# Patient Record
Sex: Female | Born: 1945 | Race: White | Hispanic: No | Smoking: Never smoker
Health system: Southern US, Community
[De-identification: ages and names within clinical notes are randomized; demographics above are authoritative.]

## PROBLEM LIST (undated history)

## (undated) DIAGNOSIS — I440 Atrioventricular block, first degree: Secondary | ICD-10-CM

## (undated) DIAGNOSIS — C679 Malignant neoplasm of bladder, unspecified: Secondary | ICD-10-CM

## (undated) DIAGNOSIS — E119 Type 2 diabetes mellitus without complications: Secondary | ICD-10-CM

## (undated) DIAGNOSIS — C229 Malignant neoplasm of liver, not specified as primary or secondary: Secondary | ICD-10-CM

## (undated) DIAGNOSIS — G2581 Restless legs syndrome: Secondary | ICD-10-CM

## (undated) DIAGNOSIS — K219 Gastro-esophageal reflux disease without esophagitis: Secondary | ICD-10-CM

## (undated) DIAGNOSIS — E079 Disorder of thyroid, unspecified: Secondary | ICD-10-CM

## (undated) DIAGNOSIS — M1990 Unspecified osteoarthritis, unspecified site: Secondary | ICD-10-CM

## (undated) DIAGNOSIS — R7303 Prediabetes: Secondary | ICD-10-CM

## (undated) DIAGNOSIS — C50211 Malignant neoplasm of upper-inner quadrant of right female breast: Secondary | ICD-10-CM

## (undated) DIAGNOSIS — C50919 Malignant neoplasm of unspecified site of unspecified female breast: Secondary | ICD-10-CM

## (undated) DIAGNOSIS — C649 Malignant neoplasm of unspecified kidney, except renal pelvis: Secondary | ICD-10-CM

## (undated) DIAGNOSIS — I341 Nonrheumatic mitral (valve) prolapse: Secondary | ICD-10-CM

## (undated) DIAGNOSIS — D649 Anemia, unspecified: Secondary | ICD-10-CM

## (undated) DIAGNOSIS — Z170 Estrogen receptor positive status [ER+]: Secondary | ICD-10-CM

## (undated) DIAGNOSIS — J189 Pneumonia, unspecified organism: Secondary | ICD-10-CM

## (undated) DIAGNOSIS — I10 Essential (primary) hypertension: Secondary | ICD-10-CM

## (undated) DIAGNOSIS — F329 Major depressive disorder, single episode, unspecified: Secondary | ICD-10-CM

## (undated) DIAGNOSIS — Z95810 Presence of automatic (implantable) cardiac defibrillator: Secondary | ICD-10-CM

## (undated) DIAGNOSIS — I472 Ventricular tachycardia: Secondary | ICD-10-CM

## (undated) DIAGNOSIS — I4510 Unspecified right bundle-branch block: Secondary | ICD-10-CM

## (undated) DIAGNOSIS — I4729 Other ventricular tachycardia: Secondary | ICD-10-CM

## (undated) DIAGNOSIS — N2889 Other specified disorders of kidney and ureter: Secondary | ICD-10-CM

## (undated) DIAGNOSIS — I480 Paroxysmal atrial fibrillation: Secondary | ICD-10-CM

## (undated) DIAGNOSIS — Z7901 Long term (current) use of anticoagulants: Secondary | ICD-10-CM

## (undated) DIAGNOSIS — K589 Irritable bowel syndrome without diarrhea: Secondary | ICD-10-CM

## (undated) DIAGNOSIS — C801 Malignant (primary) neoplasm, unspecified: Secondary | ICD-10-CM

## (undated) DIAGNOSIS — M51369 Other intervertebral disc degeneration, lumbar region without mention of lumbar back pain or lower extremity pain: Secondary | ICD-10-CM

## (undated) DIAGNOSIS — G4700 Insomnia, unspecified: Secondary | ICD-10-CM

## (undated) DIAGNOSIS — F32A Depression, unspecified: Secondary | ICD-10-CM

## (undated) DIAGNOSIS — F419 Anxiety disorder, unspecified: Secondary | ICD-10-CM

## (undated) DIAGNOSIS — E039 Hypothyroidism, unspecified: Secondary | ICD-10-CM

## (undated) DIAGNOSIS — Z923 Personal history of irradiation: Secondary | ICD-10-CM

## (undated) DIAGNOSIS — R0600 Dyspnea, unspecified: Secondary | ICD-10-CM

## (undated) DIAGNOSIS — R001 Bradycardia, unspecified: Secondary | ICD-10-CM

## (undated) DIAGNOSIS — R319 Hematuria, unspecified: Secondary | ICD-10-CM

## (undated) DIAGNOSIS — E785 Hyperlipidemia, unspecified: Secondary | ICD-10-CM

## (undated) DIAGNOSIS — G709 Myoneural disorder, unspecified: Secondary | ICD-10-CM

## (undated) DIAGNOSIS — Z1379 Encounter for other screening for genetic and chromosomal anomalies: Principal | ICD-10-CM

## (undated) DIAGNOSIS — M5136 Other intervertebral disc degeneration, lumbar region: Secondary | ICD-10-CM

## (undated) HISTORY — DX: Encounter for other screening for genetic and chromosomal anomalies: Z13.79

## (undated) HISTORY — DX: Malignant neoplasm of unspecified site of unspecified female breast: C50.919

## (undated) HISTORY — DX: Hyperlipidemia, unspecified: E78.5

## (undated) HISTORY — DX: Prediabetes: R73.03

## (undated) HISTORY — DX: Restless legs syndrome: G25.81

## (undated) HISTORY — PX: LAPAROSCOPIC CHOLECYSTECTOMY: SUR755

## (undated) HISTORY — DX: Depression, unspecified: F32.A

## (undated) HISTORY — DX: Anxiety disorder, unspecified: F41.9

## (undated) HISTORY — PX: COLONOSCOPY: SHX174

## (undated) HISTORY — DX: Major depressive disorder, single episode, unspecified: F32.9

## (undated) HISTORY — PX: CATARACT EXTRACTION: SUR2

## (undated) HISTORY — DX: Insomnia, unspecified: G47.00

## (undated) HISTORY — DX: Essential (primary) hypertension: I10

## (undated) HISTORY — PX: CHOLECYSTECTOMY: SHX55

## (undated) HISTORY — PX: LOOP RECORDER INSERTION: EP1214

## (undated) HISTORY — PX: CATARACT EXTRACTION W/ INTRAOCULAR LENS  IMPLANT, BILATERAL: SHX1307

## (undated) HISTORY — PX: TONSILLECTOMY: SUR1361

## (undated) HISTORY — PX: INSERTION OF ICD: SHX6689

## (undated) HISTORY — DX: Disorder of thyroid, unspecified: E07.9

---
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
## 2014-11-03 ENCOUNTER — Ambulatory Visit
Admission: RE | Admit: 2014-11-03 | Discharge: 2014-11-03 | Disposition: A | Discharge status: Home / Self Care | Payer: Medicare PPO | Source: Ambulatory Visit | Attending: Family Medicine | Admitting: Family Medicine

## 2014-11-03 ENCOUNTER — Other Ambulatory Visit: Payer: Self-pay | Admitting: Family Medicine

## 2014-11-03 DIAGNOSIS — M25559 Pain in unspecified hip: Secondary | ICD-10-CM

## 2014-11-03 DIAGNOSIS — M545 Low back pain: Secondary | ICD-10-CM

## 2015-09-28 DIAGNOSIS — F5101 Primary insomnia: Secondary | ICD-10-CM | POA: Diagnosis not present

## 2015-09-28 DIAGNOSIS — Z23 Encounter for immunization: Secondary | ICD-10-CM | POA: Diagnosis not present

## 2015-12-10 DIAGNOSIS — G4700 Insomnia, unspecified: Secondary | ICD-10-CM | POA: Diagnosis not present

## 2015-12-10 DIAGNOSIS — G2581 Restless legs syndrome: Secondary | ICD-10-CM | POA: Diagnosis not present

## 2016-01-21 DIAGNOSIS — G2581 Restless legs syndrome: Secondary | ICD-10-CM | POA: Diagnosis not present

## 2016-02-08 DIAGNOSIS — I10 Essential (primary) hypertension: Secondary | ICD-10-CM | POA: Diagnosis not present

## 2016-02-11 DIAGNOSIS — G4700 Insomnia, unspecified: Secondary | ICD-10-CM | POA: Diagnosis not present

## 2016-02-18 DIAGNOSIS — Z803 Family history of malignant neoplasm of breast: Secondary | ICD-10-CM | POA: Diagnosis not present

## 2016-02-18 DIAGNOSIS — Z1231 Encounter for screening mammogram for malignant neoplasm of breast: Secondary | ICD-10-CM | POA: Diagnosis not present

## 2016-03-07 DIAGNOSIS — N6001 Solitary cyst of right breast: Secondary | ICD-10-CM | POA: Diagnosis not present

## 2016-03-07 DIAGNOSIS — N6312 Unspecified lump in the right breast, upper inner quadrant: Secondary | ICD-10-CM | POA: Diagnosis not present

## 2016-03-10 DIAGNOSIS — C50911 Malignant neoplasm of unspecified site of right female breast: Secondary | ICD-10-CM | POA: Diagnosis not present

## 2016-03-10 DIAGNOSIS — C50811 Malignant neoplasm of overlapping sites of right female breast: Secondary | ICD-10-CM | POA: Diagnosis not present

## 2016-03-10 DIAGNOSIS — N6001 Solitary cyst of right breast: Secondary | ICD-10-CM | POA: Diagnosis not present

## 2016-03-12 ENCOUNTER — Telehealth: Payer: Self-pay | Admitting: *Deleted

## 2016-03-12 NOTE — Telephone Encounter (Signed)
Left vm for return call regarding [place] on 03/19/16. Contact information provided.

## 2016-03-12 NOTE — Telephone Encounter (Signed)
Confirmed [facility] for 03/19/16 at 1215 .  Instructions and contact information given.

## 2016-03-18 ENCOUNTER — Encounter: Payer: Self-pay | Admitting: *Deleted

## 2016-03-18 ENCOUNTER — Other Ambulatory Visit: Payer: Self-pay | Admitting: *Deleted

## 2016-03-18 DIAGNOSIS — Z170 Estrogen receptor positive status [ER+]: Principal | ICD-10-CM

## 2016-03-18 DIAGNOSIS — C50211 Malignant neoplasm of upper-inner quadrant of right female breast: Secondary | ICD-10-CM | POA: Insufficient documentation

## 2016-03-19 ENCOUNTER — Ambulatory Visit
Admission: RE | Admit: 2016-03-19 | Discharge: 2016-03-19 | Disposition: A | Discharge status: Home / Self Care | Payer: Medicare PPO | Source: Ambulatory Visit | Attending: Radiation Oncology | Admitting: Radiation Oncology

## 2016-03-19 ENCOUNTER — Ambulatory Visit: Payer: Medicare PPO | Attending: General Surgery | Admitting: Physical Therapy

## 2016-03-19 ENCOUNTER — Encounter: Payer: Self-pay | Admitting: Oncology

## 2016-03-19 ENCOUNTER — Ambulatory Visit (HOSPITAL_BASED_OUTPATIENT_CLINIC_OR_DEPARTMENT_OTHER): Payer: Medicare PPO | Admitting: Oncology

## 2016-03-19 ENCOUNTER — Encounter: Payer: Self-pay | Admitting: Physical Therapy

## 2016-03-19 ENCOUNTER — Other Ambulatory Visit (HOSPITAL_BASED_OUTPATIENT_CLINIC_OR_DEPARTMENT_OTHER): Payer: Medicare PPO

## 2016-03-19 ENCOUNTER — Encounter: Payer: Self-pay | Admitting: General Practice

## 2016-03-19 ENCOUNTER — Other Ambulatory Visit: Payer: Self-pay | Admitting: General Surgery

## 2016-03-19 VITALS — BP 117/70 | HR 69 | Temp 97.6°F | Resp 18 | Ht 66.0 in | Wt 181.0 lb

## 2016-03-19 DIAGNOSIS — Z170 Estrogen receptor positive status [ER+]: Secondary | ICD-10-CM | POA: Insufficient documentation

## 2016-03-19 DIAGNOSIS — Z803 Family history of malignant neoplasm of breast: Secondary | ICD-10-CM

## 2016-03-19 DIAGNOSIS — C50211 Malignant neoplasm of upper-inner quadrant of right female breast: Secondary | ICD-10-CM | POA: Insufficient documentation

## 2016-03-19 DIAGNOSIS — F419 Anxiety disorder, unspecified: Secondary | ICD-10-CM | POA: Diagnosis not present

## 2016-03-19 DIAGNOSIS — I10 Essential (primary) hypertension: Secondary | ICD-10-CM | POA: Diagnosis not present

## 2016-03-19 DIAGNOSIS — Z800 Family history of malignant neoplasm of digestive organs: Secondary | ICD-10-CM | POA: Diagnosis not present

## 2016-03-19 DIAGNOSIS — Z806 Family history of leukemia: Secondary | ICD-10-CM

## 2016-03-19 DIAGNOSIS — C50311 Malignant neoplasm of lower-inner quadrant of right female breast: Secondary | ICD-10-CM | POA: Diagnosis not present

## 2016-03-19 DIAGNOSIS — M545 Low back pain: Secondary | ICD-10-CM

## 2016-03-19 DIAGNOSIS — R293 Abnormal posture: Secondary | ICD-10-CM | POA: Insufficient documentation

## 2016-03-19 DIAGNOSIS — M5136 Other intervertebral disc degeneration, lumbar region: Secondary | ICD-10-CM | POA: Diagnosis not present

## 2016-03-19 DIAGNOSIS — E039 Hypothyroidism, unspecified: Secondary | ICD-10-CM | POA: Diagnosis not present

## 2016-03-19 LAB — CBC WITH DIFFERENTIAL/PLATELET
BASO%: 0.7 % (ref 0.0–2.0)
BASOS ABS: 0.1 10*3/uL (ref 0.0–0.1)
EOS%: 1.9 % (ref 0.0–7.0)
Eosinophils Absolute: 0.1 10*3/uL (ref 0.0–0.5)
HCT: 43.1 % (ref 34.8–46.6)
HGB: 14.5 g/dL (ref 11.6–15.9)
LYMPH%: 35.8 % (ref 14.0–49.7)
MCH: 30.3 pg (ref 25.1–34.0)
MCHC: 33.5 g/dL (ref 31.5–36.0)
MCV: 90.4 fL (ref 79.5–101.0)
MONO#: 0.4 10*3/uL (ref 0.1–0.9)
MONO%: 5.4 % (ref 0.0–14.0)
NEUT#: 4.4 10*3/uL (ref 1.5–6.5)
NEUT%: 56.2 % (ref 38.4–76.8)
Platelets: 327 10*3/uL (ref 145–400)
RBC: 4.77 10*6/uL (ref 3.70–5.45)
RDW: 13.4 % (ref 11.2–14.5)
WBC: 7.8 10*3/uL (ref 3.9–10.3)
lymph#: 2.8 10*3/uL (ref 0.9–3.3)

## 2016-03-19 LAB — COMPREHENSIVE METABOLIC PANEL
ALK PHOS: 45 U/L (ref 40–150)
ALT: 10 U/L (ref 0–55)
AST: 15 U/L (ref 5–34)
Albumin: 3.7 g/dL (ref 3.5–5.0)
Anion Gap: 10 mEq/L (ref 3–11)
BUN: 19.2 mg/dL (ref 7.0–26.0)
CHLORIDE: 102 meq/L (ref 98–109)
CO2: 29 mEq/L (ref 22–29)
Calcium: 9.9 mg/dL (ref 8.4–10.4)
Creatinine: 1.4 mg/dL — ABNORMAL HIGH (ref 0.6–1.1)
EGFR: 36 mL/min/{1.73_m2} — ABNORMAL LOW (ref >90)
GLUCOSE: 109 mg/dL (ref 70–140)
POTASSIUM: 3.5 meq/L (ref 3.5–5.1)
SODIUM: 141 meq/L (ref 136–145)
Total Bilirubin: 0.42 mg/dL (ref 0.20–1.20)
Total Protein: 7.2 g/dL (ref 6.4–8.3)

## 2016-03-19 NOTE — Patient Instructions (Signed)

## 2016-03-19 NOTE — Progress Notes (Signed)
[facility] Psychosocial Distress Screening Spiritual Care  Met with Cynthia [name] and her husband in [facility] to introduce [facility] team/resources, reviewing distress screen per protocol.  The patient scored a 4 on the Psychosocial Distress Thermometer which indicates moderate distress. Also assessed for distress and other psychosocial needs.   ONCBCN DISTRESS SCREENING 03/19/2016  Screening Type Initial Screening  Distress experienced in past week (1-10) 4  Family Problem type Partner  Emotional problem type Adjusting to illness  Spiritual/Religous concerns type Facing my mortality  Physical Problem type Sleep/insomnia  Referral to support programs Yes    Couple is [place] and finds great comfort in faith, prayer (including support from others), and confidence in afterlife.  Per pt, her sister died of breast cancer, another sister is a GI cancer survivor (dx in her 31s), their brother has leukemia, and a niece had a radical mastectomy.  This family history contributes to couple's anxiety, and pt's new dx stirs up past grief--and both stimulate deeper awareness of their own mortality.  [name] engaged readily in conversation and welcome pastoral presence.  Per pt, she is his caregiver (several health concerns plus old injury) and also provides some care for their son, who lives with them and needs assistance with life skills such as financial counseling.  [name] worked at a bank for 50 years.  [name] has a Purple Heart and a Silver Star.  He served in [place] and subsequently worked as a Engineer, structural.  Follow up needed: No.  [name] state that they have no other concerns at this time, but feel very comfortable reaching out for further connection.  They have a full packet of [place], but please also page if immediate needs arise.  Thank you!   [place], [place], [facility] Pager [phone] Voicemail [phone]

## 2016-03-19 NOTE — Progress Notes (Signed)
Nutrition Assessment  Reason for Assessment:  Pt seen in Breast Clinic  ASSESSMENT:   71 year old female right breast cancer. Past medical history of HTN, anxiety.  Patient reports normal appetite.  Medications:  reviewed  Labs: reviewed  Anthropometrics:   Height: 66 inches Weight: 181 pounds BMI: 29.3   NUTRITION DIAGNOSIS: Food and nutrition related knowledge deficit related to new diagnosis of breast cancer as evidenced by no prior need for nutrition related information.  INTERVENTION:    Discussed and provided packet of information regarding nutritional tips for breast cancer patients.  Questions answered.  Teachback method used.  Contact information provided and patient knows to contact me with questions/concerns.    MONITORING, EVALUATION, and GOAL: Pt will consume a healthy plant based diet to maintain lean body mass throughout treatment.   Crescent Gotham B. Zenia Resides, [place], [place] Registered Dietitian [phone] (pager)

## 2016-03-19 NOTE — Progress Notes (Signed)
Radiation Oncology         [phone] ________________________________  Name: Cynthia [name] MRN: [id number]  Date: 03/19/2016  DOB: 1945-09-13  CB:SWHQ, Valla Leaver, MD  Fanny Skates, MD     REFERRING PHYSICIAN: Fanny Skates, MD   DIAGNOSIS: The encounter diagnosis was Malignant neoplasm of upper-inner quadrant of right breast in female, estrogen receptor positive ([place]). 71 y.o. 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  HISTORY OF PRESENT ILLNESS: Cynthia [name] is a 71 y.o. female seen in multidisciplinary breast clinic for a newly diagnosed right breast cancer. Screening mammography detected asymmetry in the right breast. Ultrasound/diagnostic mammography revealed a 49m oval mass at 3'oclock position in the right breast . Ultrasound guided biopsy was performed on 03/10/16 and pathology returned showing invasive ductal carcinoma, grade 1. Hormone receptor status is ER (100%), PR (95%), HER-2 (negative) and Ki67 (10%). She comes today to review the treatment options and recommendations for her newly diagnosed breast cancer.    PREVIOUS RADIATION THERAPY: No   PAST MEDICAL HISTORY: No past medical history on file.     PAST SURGICAL HISTORY:No past surgical history on file.   FAMILY HISTORY: No family history on file.   SOCIAL HISTORY:   She is married [place]  She is retired and is the primary caregiver for her husband who is currently under treatment for a slow growing brain tumor.   ALLERGIES: Patient has no allergy information on record.   MEDICATIONS:  No current outpatient prescriptions on file.   No current facility-administered medications for this encounter.    Gynecologic History  Age at first menstrual period? 9 years (approximately)  Are you still having periods? No Approximate date of last period? Cannot remember  If you are still having periods: Are your  periods regular? N/A  If you no longer have periods: Have you used hormone replacement? Yes  If YES, for how long? Cannot remember When did you stop? Stopped within last 10 years. Obstetric History:  How many children have you carried to term? 2 Your age at first live birth? 89 Pregnant now or trying to get pregnant? No  Have you used birth control pills or hormone shots for contraception? Yes  If so, for how long (or approximate dates)? 10 years - this is an estimate  Would you be interested in learning more about the options to preserve fertility? N/A Health Maintenance:  Have you ever had a colonoscopy? Yes If yes, date? 07/25/2010  Have you ever had a bone density? Yes If yes, date? 08/26/2013  Date of your last PAP smear? 04/04/2010 Date of your FIRST mammogram? Don't remember  REVIEW OF SYSTEMS: On review of systems, the patient reports that she is doing well overall. She denies any chest pain, shortness of breath, cough, fevers, chills, night sweats, unintended weight changes. She does report 9 lb intentional weight loss since the first of the year. She denies any bowel or bladder disturbances, and denies abdominal pain, nausea or vomiting. She reports heartburn. She reports back pain associated with a severe degenerated disc. She reports anxiety and claustrophobia concerns. She takes Xanax to manage her anxiety. She reports thyroid problem and is on thyroid medication. A complete review of systems is obtained and is otherwise negative.  The patient has a trip planned for May 7th for one week in  [place]   PHYSICAL EXAM:  Wt Readings from Last 3 Encounters:  03/19/16 181 lb (82.1 kg)   Temp Readings from Last 3 Encounters:  03/19/16 97.6 F (36.4 C) (Oral)   BP Readings from Last 3 Encounters:  03/19/16 117/70   Pulse Readings from Last 3 Encounters:  03/19/16 69    0/10  In general this is a well appearing caucasian female in no acute distress. She is alert and oriented x4 and  appropriate throughout the examination. HEENT reveals that the patient is normocephalic, atraumatic. EOMs are intact. PERRLA. Skin is intact without any evidence of gross lesions. Cardiovascular exam reveals a regular rate and rhythm, no clicks rubs or murmurs are auscultated. Chest is clear to auscultation bilaterally. Bilateral breast exam was performed, no palpable masses bilaterally and no nipple discharge bilaterally. Biopsy site on the right breast is well healed without signs of infection or drainage and with minimal residual ecchymosis. Lymphatic assessment is performed and does not reveal any adenopathy in the cervical, supraclavicular, axillary, or inguinal chains. Abdomen has active bowel sounds in all quadrants and is intact. The abdomen is soft, non tender, non distended. Lower extremities are negative for pretibial pitting edema, deep calf tenderness, cyanosis or clubbing.   ECOG = 0  0 - Asymptomatic (Fully active, able to carry on all predisease activities without restriction)  1 - Symptomatic but completely ambulatory (Restricted in physically strenuous activity but ambulatory and able to carry out work of a light or sedentary nature. For example, light housework, office work)  2 - Symptomatic, <50% in bed during the day (Ambulatory and capable of all self care but unable to carry out any work activities. Up and about more than 50% of waking hours)  3 - Symptomatic, >50% in bed, but not bedbound (Capable of only limited self-care, confined to bed or chair 50% or more of waking hours)  4 - Bedbound (Completely disabled. Cannot carry on any self-care. Totally confined to bed or chair)  5 - Death   Santiago Glad MM, Creech RH, Tormey DC, et al. [phone]). "Toxicity and response criteria of the [facility]". Am. Evlyn Clines. Oncol. 5 (6): 649-55    LABORATORY DATA:  Lab Results  Component Value Date   WBC 7.8 03/19/2016   HGB 14.5 03/19/2016   HCT 43.1 03/19/2016   MCV  90.4 03/19/2016   PLT 327 03/19/2016   Lab Results  Component Value Date   NA 141 03/19/2016   K 3.5 03/19/2016   CO2 29 03/19/2016   Lab Results  Component Value Date   ALT 10 03/19/2016   AST 15 03/19/2016   ALKPHOS 45 03/19/2016   BILITOT 0.42 03/19/2016      RADIOGRAPHY: No results found.     IMPRESSION/PLAN: 1. 71 y.o. 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  Dr. Mitzi Hansen discusses the pathology findings and reviews the nature of invasive breast disease. The consensus from the breast conference include breast conservation with lumpectomy with sentinel mapping. Provided that chemotherapy is not indicated, the patient's course would then be followed by external radiotherapy to the breast followed by antiestrogen therapy with an AI. If chemotherapy is indicated, we would follow chemotherapy. We discussed the risks, benefits, short, and long term effects of radiotherapy, and the patient is interested in proceeding. Dr. Mitzi Hansen discusses the delivery and logistics of radiotherapy. Dr. Mitzi Hansen reviews his recommendation for a course of approximately 4 weeks of radiotherapy. We will see her back about 2 weeks after surgery to move forward  with the simulation and planning process and anticipate starting radiotherapy about 4 weeks after surgery.   2. Anxiety and claustrophobia concerns. The patient currently takes Xanax to manage anxiety and will plan to take this medication prior to radiation treatment.   The above documentation reflects my direct findings during this shared patient visit. Please see separate note by Dr. [name] on this date for the remainder of the patient's plan of care.    Nicholos Johns, PA-C  This document serves as a record of services personally performed by Kyung Rudd, MD and Freeman Caldron, PA-C. It was created on their behalf by Arlyce Harman, a trained medical scribe. The creation of this  record is based on the scribe's personal observations and the provider's statements to them. This document has been checked and approved by the attending provider.

## 2016-03-19 NOTE — Progress Notes (Signed)
Cynthia [name]  [phone] Fax:(336) [phone]     ID: Cynthia [name] DOB: January 01, 1946  MR#: [id number]  IRW#:[id number]  Patient Care Team: Cynthia Small, MD as PCP - General (Family Medicine) Cynthia Skates, MD as Consulting Physician (General Surgery) Cynthia Cruel, MD as Consulting Physician (Oncology) Cynthia Rudd, MD as Consulting Physician (Radiation Oncology) Cynthia Cruel, MD OTHER MD:  CHIEF COMPLAINT: Estrogen receptor positive breast cancer  CURRENT TREATMENT: Awaiting definitive surgery   BREAST CANCER HISTORY: Cynthia [name] had bilateral screening mammography with tomosynthesis at [facility] every 12/25/2016. There was a new area of asymmetry in the right breast at the 4:00 position. She was recalled for diagnostic mammography 03/07/2016. The breast density was category B. This found a new oval mass in the right breast upper inner quadrant with indistinct margins. Ultrasound of the right breast was performed the same day and confirmed a 0.6 cm oval mass in the right breast correlating with mammography findings. There was also a benign 3 mm cyst in the right breast upper inner quadrant middle depth  Biopsy of the right breast mass in question 03/10/2016 found ([id number]) invasive ductal carcinoma, grade 1, estrogen receptor 1 or percent positive, progesterone receptor 95% positive, both with strong staining intensity, with an MIB-1 of 10%, and no HER-2 implication, the signals ratio being 1.40 and the number per cell 2.10.  Her subsequent history is as detailed below.  INTERVAL HISTORY: Cynthia [name] was evaluated in the multidisciplinary breast cancer clinic 03/19/2016 accompanied by her husband Cynthia [name]. Her case was also presented in the multidisciplinary breast cancer conference that same morning. At that time a preliminary plan was proposed: Breast conserving surgery with sentinel lymph node biopsy, no chemotherapy/no Oncotype, consideration of adjuvant radiation, hormone  therapy  REVIEW OF SYSTEMS: There were no specific symptoms leading to the original mammogram, which was routinely scheduled. The patient denies unusual headaches, visual changes, nausea, vomiting, stiff neck, dizziness, or gait imbalance. There has been no cough, phlegm production, or pleurisy, no chest pain or pressure, and no change in bowel or bladder habits. The patient denies fever, rash, bleeding, unexplained fatigue or unexplained weight loss. She admits to some problems with heartburn, mild low back pain which is not more intense or persistent than before (she has known degenerative disease) and some anxiety, but no depression. A detailed review of systems was otherwise entirely negative.   PAST MEDICAL HISTORY: Past Medical History:  Diagnosis Date  . Anxiety   . Depression   . Hypertension   . Insomnia   . Thyroid disease     PAST SURGICAL HISTORY: Past Surgical History:  Procedure Laterality Date  . CATARACT EXTRACTION    . CHOLECYSTECTOMY      FAMILY HISTORY Family History  Problem Relation Age of Onset  . Breast cancer Sister   . Leukemia Brother   . Colon cancer Sister   Patient's father died at the age of 37 following a fall. He had a history of cerebral palsy. The patient's mother died from congestive heart failure at the age of 83. The patient had 2 sisters. One was diagnosed with colon cancer at age 33. She survives. The second one died from breast cancer diagnosed age 65. She neglected the disease and it may have been present before she was 27. One of the patient's brothers was diagnosed with leukemia at age 35. The patient also has one niece diagnosed with breast cancer in her early 30s  GYNECOLOGIC HISTORY:  No LMP recorded. Patient is  postmenopausal.  Menarche age 82, first live birth age 61, the patient is Cynthia [name], she stopped having periods about 20 years ago, didn't take hormone replacement for many years, stopping approximately 2005.   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 Her husband Cynthia [name] (goes by "Cynthia [name]") was in the [employer], worked in the police department until he was run over by car, then worked for the [employer] on [employer] side, but is now retired. They have been married 42 years as of 2018. Cynthia [name] has a daughter, Cynthia [name], from an earlier marriage. 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 Cynthia [name] and Cynthia [name] share a son, Cynthia [name], who lives in [place], and works for [employer]. The patient has 2 grandchildren. She attends a [employer].    ADVANCED DIRECTIVES: In place   HEALTH MAINTENANCE: Social History  Substance Use Topics  . Smoking status: Never Smoker  . Smokeless tobacco: Never Used  . Alcohol use Yes     Colonoscopy:2012, in [place]  PAP: 2012   Bone density: 2015: Normal   Not on File  No current outpatient prescriptions on file.   No current facility-administered medications for this visit.     OBJECTIVE: Middle-aged white woman who appears stated age 71:   03/19/16 1246  BP: 117/70  Pulse: 69  Resp: 18  Temp: 97.6 F (36.4 C)     Body mass index is 29.21 kg/m.    ECOG FS:0 - Asymptomatic  Ocular: Sclerae unicteric, pupils equal, round and reactive to light Ear-nose-throat: Oropharynx clear and moist Lymphatic: No cervical or supraclavicular adenopathy Lungs no rales or rhonchi, good excursion bilaterally Heart regular rate and rhythm, no murmur appreciated Abd soft, nontender, positive bowel sounds MSK no focal spinal tenderness, no joint edema Neuro: non-focal, well-oriented, appropriate affect Breasts: The right breast is status post recent biopsy. There is a moderate ecchymosis. I do not palpate a mass. The left breast is unremarkable. Both axillae are benign.   LAB RESULTS:  CMP     Component Value Date/Time   NA 141 03/19/2016 1216   K 3.5 03/19/2016 1216   CO2 29 03/19/2016  1216   GLUCOSE 109 03/19/2016 1216   BUN 19.2 03/19/2016 1216   CREATININE 1.4 (H) 03/19/2016 1216   CALCIUM 9.9 03/19/2016 1216   PROT 7.2 03/19/2016 1216   ALBUMIN 3.7 03/19/2016 1216   AST 15 03/19/2016 1216   ALT 10 03/19/2016 1216   ALKPHOS 45 03/19/2016 1216   BILITOT 0.42 03/19/2016 1216    INo results found for: SPEP, UPEP  Lab Results  Component Value Date   WBC 7.8 03/19/2016   NEUTROABS 4.4 03/19/2016   HGB 14.5 03/19/2016   HCT 43.1 03/19/2016   MCV 90.4 03/19/2016   PLT 327 03/19/2016      Chemistry      Component Value Date/Time   NA 141 03/19/2016 1216   K 3.5 03/19/2016 1216   CO2 29 03/19/2016 1216   BUN 19.2 03/19/2016 1216   CREATININE 1.4 (H) 03/19/2016 1216      Component Value Date/Time   CALCIUM 9.9 03/19/2016 1216   ALKPHOS 45 03/19/2016 1216   AST 15 03/19/2016 1216   ALT 10 03/19/2016 1216   BILITOT 0.42 03/19/2016 1216       No results found for: LABCA2  No components found for: LABCA125  No results for input(s): INR in  the last 168 hours.  Urinalysis No results found for: COLORURINE, APPEARANCEUR, LABSPEC, PHURINE, GLUCOSEU, HGBUR, BILIRUBINUR, KETONESUR, PROTEINUR, UROBILINOGEN, NITRITE, LEUKOCYTESUR   STUDIES: No results found.  ELIGIBLE FOR AVAILABLE RESEARCH PROTOCOL: *no  ASSESSMENT: 71 y.o. [place] woman status post right breast upper inner quadrant biopsy 03/10/2016 for a clinical T1b N0, stage IA invasive ductal carcinoma, grade 1, estrogen and progesterone receptor positive, HER-2 nonamplified, with an MIB-1 of 10%  (1) breast conserving surgery with sentinel lymph node sampling planned  (2) unless the tumor is larger or more aggressive in the final pathology there now appears, no Oncotype and no chemotherapy is planned  (3) consider adjuvant radiation as appropriate  (4) adjuvant anti-estrogens to follow the completion of local treatment  (5) Genetics testing pending    PLAN: We spent the better part of  today's hour-long appointment discussing the biology of breast cancer in general, and the specifics of the patient's tumor in particular. We first reviewed the fact that cancer is not one disease but more than 100 different diseases and that it is important to keep them separate-- otherwise when friends and relatives discuss their own cancer experiences with Charlita confusion can result. Similarly we explained that if breast cancer spreads to the bone or liver, the patient would not have bone cancer or liver cancer, but breast cancer in the bone and breast cancer in the liver: one cancer in three places-- not 3 different cancers which otherwise would have to be treated in 3 different ways.  We discussed the difference between local and systemic therapy. In terms of loco-regional treatment, lumpectomy plus radiation is equivalent to mastectomy as far as survival is concerned. For this reason, and because the cosmetic results are generally superior, we recommend breast conserving surgery.   Deyani does qualify for genetics testing. If she carries a deleterious mutation, her risk of another breast cancer developing in the future may be in the 40-60% range. Many patients will choose bilateral mastectomies in that setting. An equally safe option would be intensified screening with yearly breast MRI in addition to yearly mammography. Millie feels if she did carry a deleterious mutation she would almost certainly 1 intensified screening but in any case if she wanted bilateral mastectomies she would not want to postpone her upcoming biopsy on that basis.  We then discussed the rationale for systemic therapy. There is some risk that this cancer may have already spread to other parts of her body. Patients frequently ask at this point about bone scans, CAT scans and PET scans to find out if they have occult breast cancer somewhere else. The problem is that in early stage disease we are much more likely to find false  positives then true cancers and this would expose the patient to unnecessary procedures as well as unnecessary radiation. Scans cannot answer the question the patient really would like to know, which is whether she has microscopic disease elsewhere in her body. For those reasons we do not recommend them.  Of course we would proceed to aggressive evaluation of any symptoms that might suggest metastatic disease, but that is not the case here.  Next we went over the options for systemic therapy which are anti-estrogens, anti-HER-2 immunotherapy, and chemotherapy. S[place]a does not meet criteria for anti-HER-2 immunotherapy. She is a good candidate for anti-estrogens.  The question of chemotherapy is more complicated. Chemotherapy is most effective in rapidly growing, aggressive tumors. It is much less effective in low-grade, slow growing cancers, like Rozanna 's. Given  that information and the fact that this is a very [name] tumor in a woman over 45, we do not anticipate any benefit from chemotherapy and we are not planning to send an Oncotype area if it turns out her tumor proves in the final pathology to be larger or more aggressive than we anticipate this decision can be reconsidered.  The plan then is to start with surgery and follow with hyperfractionated radiation is appropriate. I will see the patient again once she completes her local treatment and we will discuss anti-estrogens, most likely anastrozole, at that time.  Zabdi has a good understanding of the overall plan. She agrees with it. She knows the goal of treatment in her case is cure. She will call with any problems that may develop before her next visit here.  Cynthia Cruel, MD   03/19/2016 6:23 PM Medical Oncology and Hematology [facility] [address] [place], [place] [address] Tel. [phone]    Fax. [phone]

## 2016-03-19 NOTE — Therapy (Signed)
[place], [place], [address] Phone: [phone]   Fax:  [phone]  Physical Therapy Evaluation  Patient Details  Name: Cynthia [name] MRN: [id number] Date of Birth: 02-Aug-1945 Referring Provider: Dr. Fanny Skates  Encounter Date: 03/19/2016      PT End of Session - 03/19/16 1557    Visit Number 1   Number of Visits 1   PT Start Time 1416   PT Stop Time 1441   PT Time Calculation (min) 25 min   Activity Tolerance Patient tolerated treatment well   Behavior During Therapy [facility] for tasks assessed/performed      Past Medical History:  Diagnosis Date  . Anxiety   . Depression   . Hypertension   . Insomnia   . Thyroid disease     Past Surgical History:  Procedure Laterality Date  . CATARACT EXTRACTION    . CHOLECYSTECTOMY      There were no vitals filed for this visit.       Subjective Assessment - 03/19/16 1535    Subjective Patient reports she is here today to be seen by her medical team for her newly diagnosed right breast cancer.   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 It is ER/PR positive and HER2 negative with a Ki67 of 10%. It measures 6 mm and is located in the upper inner quadrant. No other medical issues that would impact rehab.   Patient Stated Goals Reduce lymphedema risk and learn post op shoulder ROM HEP   Currently in Pain? No/denies            [facility] PT Assessment - 03/19/16 0001      Assessment   Medical Diagnosis Right breast cancer   Referring Provider Dr. Fanny Skates   Onset Date/Surgical Date 02/18/16   Hand Dominance Right   Prior Therapy none     Precautions   Precautions Other (comment)   Precaution Comments Active cancer     Restrictions   Weight Bearing Restrictions No     Balance Screen   Has the patient fallen in the past 6  months No   Has the patient had a decrease in activity level because of a fear of falling?  No   Is the patient reluctant to leave their home because of a fear of falling?  No     Home Ecologist residence   Living Arrangements Spouse/significant other   Available Help at Discharge Family     Prior Function   Level of [place] Retired   Leisure She goes to the [employer] 2x/week and uses the treadmill 12 minutes     Cognition   Overall Cognitive Status Within Functional Limits for tasks assessed     Posture/Postural Control   Posture/Postural Control Postural limitations   Postural Limitations Rounded Shoulders;Forward head     ROM / Strength   AROM / PROM / Strength AROM;Strength     AROM   AROM Assessment Site Shoulder;Cervical   Right/Left Shoulder Right;Left   Right Shoulder Extension 48 Degrees   Right Shoulder Flexion 141 Degrees   Right Shoulder ABduction 152 Degrees   Right Shoulder Internal Rotation 82 Degrees   Right Shoulder External Rotation 78 Degrees   Left Shoulder Extension 55 Degrees   Left Shoulder Flexion 137 Degrees   Left Shoulder ABduction 142  Degrees   Left Shoulder Internal Rotation 73 Degrees   Left Shoulder External Rotation 84 Degrees   Cervical Flexion WNL   Cervical Extension WNL   Cervical - Right Side Bend WNL   Cervical - Left Side Bend WNL   Cervical - Right Rotation WNL   Cervical - Left Rotation WNL     Strength   Overall Strength Within functional limits for tasks performed           LYMPHEDEMA/ONCOLOGY QUESTIONNAIRE - 03/19/16 1554      Type   Cancer Type Right breast     Lymphedema Assessments   Lymphedema Assessments Upper extremities     Right Upper Extremity Lymphedema   10 cm Proximal to Olecranon Process 27 cm   Olecranon Process 24.3 cm   10 cm Proximal to Ulnar Styloid Process 21.3 cm   Just Proximal to Ulnar Styloid Process 15.9 cm   Across Hand at [employer] 18.3 cm   At [place] of 2nd Digit 6.2 cm     Left Upper Extremity Lymphedema   10 cm Proximal to Olecranon Process 28.8 cm   Olecranon Process 25.3 cm   10 cm Proximal to Ulnar Styloid Process 21.7 cm   Just Proximal to Ulnar Styloid Process 16.5 cm   Across Hand at [employer] 17.9 cm   At [place] of 2nd Digit 6.3 cm      Patient was instructed today in a home exercise program today for post op shoulder range of motion. These included active assist shoulder flexion in sitting, scapular retraction, wall walking with shoulder abduction, and hands behind head external rotation.  She was encouraged to do these twice a day, holding 3 seconds and repeating 5 times when permitted by her physician.          PT Education - 03/19/16 1556    Education provided Yes   Education Details Lymphedema risk reduction and post op ROM HEP   Person(s) Educated Patient;Spouse   Methods Explanation;Demonstration;Handout   Comprehension Returned demonstration;Verbalized understanding              Breast Clinic Goals - 03/19/16 1601      Patient will be able to verbalize understanding of pertinent lymphedema risk reduction practices relevant to her diagnosis specifically related to skin care.   Time 1   Period Days   Status Achieved     Patient will be able to return demonstrate and/or verbalize understanding of the post-op home exercise program related to regaining shoulder range of motion.   Time 1   Period Days   Status Achieved     Patient will be able to verbalize understanding of the importance of attending the postoperative After Breast Cancer Class for further lymphedema risk reduction education and therapeutic exercise.   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 It is ER/PR positive and HER2 negative with a  Ki67 of 10%. It measures 6 mm and is located in the upper inner quadrant. No other medical issues that would impact rehab. Her multidisciplinary medical team met prior to her assessment to determine a recommended treatment plan. She is planning to have a right lumpectomy and sentinel node biopsy followed by radiation and anti-estrogen therapy. She may benefit from post  op PT to regain shoulder ROM and reduce lymphedema risk. Due to her lack of comorbidities, her eval is of low complexity.   Rehab Potential Excellent   Clinical Impairments Affecting Rehab Potential none   PT Frequency One time visit   PT Treatment/Interventions Therapeutic exercise;Patient/family education   PT Next Visit Plan Will f/u after surgery to determine PT needs   PT Home Exercise Plan Post op shoulder ROM HEP   Consulted and Agree with Plan of Care Patient;Family member/caregiver   Family Member Consulted Husband      Patient will benefit from skilled therapeutic intervention in order to improve the following deficits and impairments:  Postural dysfunction, Decreased knowledge of precautions, Pain, Impaired UE functional use, Decreased range of motion  Visit Diagnosis: Abnormal posture - Plan: PT plan of care cert/re-cert  Carcinoma of upper-inner quadrant of right breast in female, estrogen receptor positive ([place]) - Plan: PT plan of care cert/re-cert      G-Codes - 14/44/58 1602    Functional Assessment Tool Used (Outpatient Only) Clinical Judgement   Functional Limitation Other PT primary   Other PT Primary Current Status (A8350) At least 1 percent but less than 20 percent impaired, limited or restricted   Other PT Primary Goal Status (V5732) At least 1 percent but less than 20 percent impaired, limited or restricted   Other PT Primary Discharge Status (Q5672) At least 1 percent but less than 20 percent impaired, limited or restricted     Patient will follow up at outpatient cancer rehab if needed following  surgery.  If the patient requires physical therapy at that time, a specific plan will be dictated and sent to the referring physician for approval. The patient was educated today on appropriate basic range of motion exercises to begin post operatively and the importance of attending the After Breast Cancer class following surgery.  Patient was educated today on lymphedema risk reduction practices as it pertains to recommendations that will benefit the patient immediately following surgery.  She verbalized good understanding.  No additional physical therapy is indicated at this time.     Problem List Patient Active Problem List   Diagnosis Date Noted  . Malignant neoplasm of upper-inner quadrant of right breast in female, estrogen receptor positive ([place]) 03/18/2016   Annia Friendly, PT 03/19/16 4:04 PM  [place], [place], [address] Phone: [phone]   Fax:  [phone]  Name: Sophy Mesler MRN: [id number] Date of Birth: 1945-09-09

## 2016-03-24 ENCOUNTER — Telehealth: Payer: Self-pay | Admitting: *Deleted

## 2016-03-24 NOTE — Telephone Encounter (Signed)
Left vm regarding [facility] from 3.14.18. Contact information provided for questions or concerns.

## 2016-03-25 ENCOUNTER — Telehealth: Payer: Self-pay | Admitting: *Deleted

## 2016-03-25 NOTE — Telephone Encounter (Signed)
Spoke to pt concerning [place] from 3.14.18. Denies questions or concerns regarding dx or treatment care plan. Encourage pt to call with needs. Received verbal understanding.

## 2016-03-26 ENCOUNTER — Encounter: Payer: Self-pay | Admitting: Radiation Oncology

## 2016-03-27 NOTE — Pre-Procedure Instructions (Signed)
Cynthia [name]  03/27/2016      [id number] [address], [place] [address] Phone: [phone] Fax: [phone]    Your procedure is scheduled on Tues. Mar. 27 at 800 AM  Report to [facility] at 600 A.M.  Call this number if you have problems the morning of surgery:  [phone]   Remember:  Do not eat food or drink liquids after midnight.  Take these medicines the morning of surgery with A SIP OF WATER acetaminophen (tylenol), alprazolam (xanax), atenolol (tenormin), levothyroxine (synthroid), tramadol (ultram)-if needed.   Do not wear jewelry, make-up or nail polish.  Do not wear lotions, powders, or perfumes, or deoderant.  Do not shave 48 hours prior to surgery.  Men may shave face and neck.  Do not bring valuables to the hospital.  [facility] is not responsible for any belongings or valuables.  Contacts, dentures or bridgework may not be worn into surgery.  Leave your suitcase in the car.  After surgery it may be brought to your room.  For patients admitted to the hospital, discharge time will be determined by your treatment team.  Patients discharged the day of surgery will not be allowed to drive home.   Special instructions:    How to Manage Your Diabetes Before and After Surgery  Why is it important to control my blood sugar before and after surgery? . Improving blood sugar levels before and after surgery helps healing and can limit problems. . A way of improving blood sugar control is eating a healthy diet by: o  Eating less sugar and carbohydrates o  Increasing activity/exercise o  Talking with your doctor about reaching your blood sugar goals . High blood sugars (greater than 180 mg/dL) can raise your risk of infections and slow your recovery, so you will need to focus on controlling your diabetes during the weeks before surgery. . Make sure that the doctor who takes care of  your diabetes knows about your planned surgery including the date and location.  How do I manage my blood sugar before surgery? . Check your blood sugar at least 4 times a day, starting 2 days before surgery, to make sure that the level is not too high or low. o Check your blood sugar the morning of your surgery when you wake up and every 2 hours until you get to the Short Stay unit. . If your blood sugar is less than 70 mg/dL, you will need to treat for low blood sugar: o Do not take insulin. o Treat a low blood sugar (less than 70 mg/dL) with  cup of clear juice (cranberry or apple), 4 glucose tablets, OR glucose gel. o Recheck blood sugar in 15 minutes after treatment (to make sure it is greater than 70 mg/dL). If your blood sugar is not greater than 70 mg/dL on recheck, call [phone] for further instructions. . Report your blood sugar to the short stay nurse when you get to Short Stay.  . If you are admitted to the hospital after surgery: o Your blood sugar will be checked by the staff and you will probably be given insulin after surgery (instead of oral diabetes medicines) to make sure you have good blood sugar levels. o The goal for blood sugar control after surgery is 80-180 mg/dL.     WHAT DO I DO ABOUT MY DIABETES MEDICATION?   Marland Kitchen Do not take oral diabetes medicines (pills)  the morning of surgery.  Reviewed and Endorsed by [facility] Patient Education Committee, August 2015   Please read over the following fact sheets that you were given. Pain Booklet, Coughing and Deep Breathing and Surgical Site Infection Prevention

## 2016-03-28 ENCOUNTER — Encounter (HOSPITAL_COMMUNITY)
Admission: RE | Admit: 2016-03-28 | Discharge: 2016-03-28 | Disposition: A | Discharge status: Home / Self Care | Payer: Medicare PPO | Source: Ambulatory Visit | Attending: General Surgery | Admitting: General Surgery

## 2016-03-28 ENCOUNTER — Encounter (HOSPITAL_COMMUNITY): Payer: Self-pay

## 2016-03-28 DIAGNOSIS — Z01812 Encounter for preprocedural laboratory examination: Secondary | ICD-10-CM | POA: Diagnosis not present

## 2016-03-28 DIAGNOSIS — C50911 Malignant neoplasm of unspecified site of right female breast: Secondary | ICD-10-CM | POA: Diagnosis not present

## 2016-03-28 DIAGNOSIS — Z01818 Encounter for other preprocedural examination: Secondary | ICD-10-CM | POA: Diagnosis not present

## 2016-03-28 DIAGNOSIS — R001 Bradycardia, unspecified: Secondary | ICD-10-CM | POA: Insufficient documentation

## 2016-03-28 HISTORY — DX: Gastro-esophageal reflux disease without esophagitis: K21.9

## 2016-03-28 HISTORY — DX: Other intervertebral disc degeneration, lumbar region: M51.36

## 2016-03-28 HISTORY — DX: Malignant (primary) neoplasm, unspecified: C80.1

## 2016-03-28 HISTORY — DX: Type 2 diabetes mellitus without complications: E11.9

## 2016-03-28 HISTORY — DX: Restless legs syndrome: G25.81

## 2016-03-28 HISTORY — DX: Hypothyroidism, unspecified: E03.9

## 2016-03-28 HISTORY — DX: Other intervertebral disc degeneration, lumbar region without mention of lumbar back pain or lower extremity pain: M51.369

## 2016-03-28 HISTORY — DX: Unspecified osteoarthritis, unspecified site: M19.90

## 2016-03-28 HISTORY — DX: Irritable bowel syndrome, unspecified: K58.9

## 2016-03-28 LAB — CBC
HEMATOCRIT: 44.8 % (ref 36.0–46.0)
Hemoglobin: 14.7 g/dL (ref 12.0–15.0)
MCH: 30.2 pg (ref 26.0–34.0)
MCHC: 32.8 g/dL (ref 30.0–36.0)
MCV: 92 fL (ref 78.0–100.0)
Platelets: 380 10*3/uL (ref 150–400)
RBC: 4.87 MIL/uL (ref 3.87–5.11)
RDW: 13.6 % (ref 11.5–15.5)
WBC: 9.5 10*3/uL (ref 4.0–10.5)

## 2016-03-28 LAB — BASIC METABOLIC PANEL
Anion gap: 11 (ref 5–15)
BUN: 15 mg/dL (ref 6–20)
CHLORIDE: 100 mmol/L — AB (ref 101–111)
CO2: 28 mmol/L (ref 22–32)
Calcium: 9.7 mg/dL (ref 8.9–10.3)
Creatinine, Ser: 0.99 mg/dL (ref 0.44–1.00)
GFR calc Af Amer: >60 mL/min (ref >60)
GFR calc non Af Amer: 55 mL/min — ABNORMAL LOW (ref >60)
GLUCOSE: 98 mg/dL (ref 65–99)
POTASSIUM: 3.4 mmol/L — AB (ref 3.5–5.1)
Sodium: 139 mmol/L (ref 135–145)

## 2016-03-28 LAB — GLUCOSE, CAPILLARY: Glucose-Capillary: 78 mg/dL (ref 65–99)

## 2016-03-28 NOTE — Progress Notes (Addendum)
PCP  Dr. Graciela Husbands [facility]icians will request last office visit.  Pt. Has never been told she was diabetic,but was placed on metformin 10 years ago in [place].    Does not check Blood Sugars at home  .

## 2016-03-29 LAB — HEMOGLOBIN A1C
Hgb A1c MFr Bld: 5.9 % — ABNORMAL HIGH (ref 4.8–5.6)
MEAN PLASMA GLUCOSE: 123 mg/dL

## 2016-03-30 NOTE — H&P (Signed)
Cynthia [name] Location: [facility] Patient #: [id number] DOB: 1945-10-21 Undefined / Language: Cynthia [name] / Race: White Female      History of Present Illness       The patient is a 71 year old female who presents with breast cancer. This is a very pleasant 70 year old woman from [place]. She is here with her husband throughout the encounter. She is referred by [name] at [facility] mammography for evaluation and management of an invasive ductal carcinoma of the right breast. [name] is her PCP. She was seen in the [facility] recently by Dr. [name], Dr. [name], and me.       She has no prior history of breast problems. Last mammogram was about 2-1/2 years ago since she moved from [place]. Screening mammograms show a 6 mm mass in the right breast upper inner quadrant 3:00 position. Image guided biopsy shows grade 1 invasive ductal carcinoma, ER 100%, PR 95%, KS 6710%, HER-2 negative. She has done well since the biopsy.      Comorbidities reveal hypothyroidism, degenerative disc disease, borderline diabetes, hypertension. Family history reveals sister died age 38 for metastatic breast cancer. A niece had breast cancer, bilateral mastectomies, survive. A second sister had colon cancer and survive. A brother has leukemia. Social history reveals that she is married. Her husband is with her. They have planned a trip to [place] departing May 7 and want to get the surgery done as soon as possible. They used to live in [place]. They have a son and a daughter. She does not smoke.         We had a long talk about management of breast cancer in general and her specific situation. We discussed the options of lumpectomy and sentinel node biopsy, mastectomy with or without reconstruction. She is strongly motivated for breast conservation surgery I think she is an excellent candidate for that.       She'll be scheduled for right breast lumpectomy with radioactive seed localization,  inject blue dye, right axillary deep sentinel lymph node mapping and biopsy. I discussed the indications, details, techniques, and numerous risk of the surgery with her and her husband. They're aware of the risk of bleeding, infection, cosmetic deformity, reoperation for positive margins or positive nodes, nerve damage with chronic pain or numbness, arm swelling and arm numbness, and other unforeseen problems. They understand these issues well. At this time all of her questions are answered. She agrees with this plan.         She knows that decisions and planning regarding radiation therapy and systemic therapy will be made after the final pathology is in. Dr. [name] is checking to see if she is a candidate for genetics. Dr. [name] is considering hyperfractionation.        We are going to try very hard to do her surgery before April 7 because she wants to leave for an important vacation on May 7.   Addendum Note([name] [name]; 03/20/2016 8:07 AM)  Dr. [name] checked, and she does qualify for genetic testing.  This may influence her long-term decisions about surgery but she wants to go ahead with lumpectomy and sentinel node now. We will standby for the genetic results.   Medication History  Medications Reconciled    Physical Exam  General Mental Status-Alert. General Appearance-Consistent with stated age. Hydration-Well hydrated. Voice-Normal.  Head and Neck Head-normocephalic, atraumatic with no lesions or palpable masses. Trachea-midline. Thyroid Gland Characteristics - normal size and consistency.  Eye Eyeball -  Bilateral-Extraocular movements intact. Sclera/Conjunctiva - Bilateral-No scleral icterus.  Chest and Lung Exam Chest and lung exam reveals -quiet, even and easy respiratory effort with no use of accessory muscles and on auscultation, normal breath sounds, no adventitious sounds and normal vocal resonance. Inspection Chest Wall -  Normal. Back - normal.  Breast Note: Breasts are large. 38D cup by history. Biopsy site and tiny ecchymoses right breast medially at 3:30 position. No palpable mass in either breast. No other skin changes. No axillary adenopathy.   Cardiovascular Cardiovascular examination reveals -normal heart sounds, regular rate and rhythm with no murmurs and normal pedal pulses bilaterally.  Abdomen Inspection Inspection of the abdomen reveals - No Hernias. Skin - Scar - no surgical scars. Palpation/Percussion Palpation and Percussion of the abdomen reveal - Soft, Non Tender, No Rebound tenderness, No Rigidity (guarding) and No hepatosplenomegaly. Auscultation Auscultation of the abdomen reveals - Bowel sounds normal.  Neurologic Neurologic evaluation reveals -alert and oriented x 3 with no impairment of recent or remote memory. Mental Status-Normal.  Musculoskeletal Normal Exam - Left-Upper Extremity Strength Normal and Lower Extremity Strength Normal. Normal Exam - Right-Upper Extremity Strength Normal and Lower Extremity Strength Normal.  Lymphatic Head & Neck  General Head & Neck Lymphatics: Bilateral - Description - Normal. Axillary  General Axillary Region: Bilateral - Description - Normal. Tenderness - Non Tender. Femoral & Inguinal  Generalized Femoral & Inguinal Lymphatics: Bilateral - Description - Normal. Tenderness - Non Tender.    Assessment & Plan  PRIMARY CANCER OF LOWER-INNER QUADRANT OF RIGHT FEMALE BREAST (C50.311)   Your recent imaging studies and biopsies showed that you have a 6 mm invasive ductal carcinoma of the right breast in the upper inner quadrant. This is a grade 1, low-grade tumor Estrogen and progesterone receptors are positive. HER-2 test is negative  We have discussed your surgical options which include lumpectomy and sentinel lymph node biopsy, mastectomy with or without reconstruction I do not believe there is any survival advantage to  mastectomy We have decided to proceed with breast conservation surgery, which I think is very appropriate in your case  You'll be scheduled for right breast lumpectomy with radioactive seed localization, injection of blue dye, and right axillary sentinel lymph node biopsy We have discussed the indications, techniques, and risks of the surgery in detail.  Dr. Dalbert Batman office should call you tomorrow to begin the scheduling process  We will try very hard to get this surgery done by April 7 so you will have one month to recover before taking your vacation.  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 Dalbert Batman, M.D., [facility] General and Minimally invasive Surgery Breast and Colorectal Surgery Office:   [phone] Pager:   [phone]

## 2016-03-31 DIAGNOSIS — C50919 Malignant neoplasm of unspecified site of unspecified female breast: Secondary | ICD-10-CM | POA: Diagnosis not present

## 2016-04-01 ENCOUNTER — Ambulatory Visit (HOSPITAL_COMMUNITY)
Admission: RE | Admit: 2016-04-01 | Discharge: 2016-04-01 | Disposition: A | Discharge status: Home / Self Care | Payer: Medicare PPO | Source: Ambulatory Visit | Attending: General Surgery | Admitting: General Surgery

## 2016-04-01 ENCOUNTER — Ambulatory Visit (HOSPITAL_COMMUNITY): Payer: Medicare PPO | Admitting: Certified Registered Nurse Anesthetist

## 2016-04-01 ENCOUNTER — Encounter (HOSPITAL_COMMUNITY)
Admission: RE | Disposition: A | Discharge status: Home / Self Care | Payer: Self-pay | Source: Ambulatory Visit | Attending: General Surgery

## 2016-04-01 ENCOUNTER — Encounter (HOSPITAL_COMMUNITY)
Admission: RE | Admit: 2016-04-01 | Discharge: 2016-04-01 | Disposition: A | Discharge status: Home / Self Care | Payer: Medicare PPO | Source: Ambulatory Visit | Attending: General Surgery | Admitting: General Surgery

## 2016-04-01 ENCOUNTER — Encounter (HOSPITAL_COMMUNITY): Payer: Self-pay | Admitting: Certified Registered Nurse Anesthetist

## 2016-04-01 DIAGNOSIS — E039 Hypothyroidism, unspecified: Secondary | ICD-10-CM | POA: Insufficient documentation

## 2016-04-01 DIAGNOSIS — Z800 Family history of malignant neoplasm of digestive organs: Secondary | ICD-10-CM | POA: Insufficient documentation

## 2016-04-01 DIAGNOSIS — Z170 Estrogen receptor positive status [ER+]: Secondary | ICD-10-CM | POA: Diagnosis not present

## 2016-04-01 DIAGNOSIS — R7303 Prediabetes: Secondary | ICD-10-CM | POA: Insufficient documentation

## 2016-04-01 DIAGNOSIS — Z806 Family history of leukemia: Secondary | ICD-10-CM | POA: Insufficient documentation

## 2016-04-01 DIAGNOSIS — C50211 Malignant neoplasm of upper-inner quadrant of right female breast: Secondary | ICD-10-CM | POA: Diagnosis not present

## 2016-04-01 DIAGNOSIS — M1990 Unspecified osteoarthritis, unspecified site: Secondary | ICD-10-CM | POA: Diagnosis not present

## 2016-04-01 DIAGNOSIS — Z7984 Long term (current) use of oral hypoglycemic drugs: Secondary | ICD-10-CM | POA: Insufficient documentation

## 2016-04-01 DIAGNOSIS — Z803 Family history of malignant neoplasm of breast: Secondary | ICD-10-CM | POA: Diagnosis not present

## 2016-04-01 DIAGNOSIS — F418 Other specified anxiety disorders: Secondary | ICD-10-CM | POA: Diagnosis not present

## 2016-04-01 DIAGNOSIS — C50311 Malignant neoplasm of lower-inner quadrant of right female breast: Secondary | ICD-10-CM | POA: Diagnosis not present

## 2016-04-01 DIAGNOSIS — Z79899 Other long term (current) drug therapy: Secondary | ICD-10-CM | POA: Diagnosis not present

## 2016-04-01 DIAGNOSIS — G8918 Other acute postprocedural pain: Secondary | ICD-10-CM | POA: Diagnosis not present

## 2016-04-01 DIAGNOSIS — I10 Essential (primary) hypertension: Secondary | ICD-10-CM | POA: Insufficient documentation

## 2016-04-01 DIAGNOSIS — N6091 Unspecified benign mammary dysplasia of right breast: Secondary | ICD-10-CM | POA: Diagnosis not present

## 2016-04-01 DIAGNOSIS — K219 Gastro-esophageal reflux disease without esophagitis: Secondary | ICD-10-CM | POA: Diagnosis not present

## 2016-04-01 HISTORY — PX: BREAST LUMPECTOMY WITH RADIOACTIVE SEED AND SENTINEL LYMPH NODE BIOPSY: SHX6550

## 2016-04-01 LAB — GLUCOSE, CAPILLARY
GLUCOSE-CAPILLARY: 90 mg/dL (ref 65–99)
Glucose-Capillary: 79 mg/dL (ref 65–99)

## 2016-04-01 SURGERY — BREAST LUMPECTOMY WITH RADIOACTIVE SEED AND SENTINEL LYMPH NODE BIOPSY
Anesthesia: Regional | Site: Breast | Laterality: Right

## 2016-04-01 MED ORDER — ONDANSETRON HCL 4 MG/2ML IJ SOLN
INTRAMUSCULAR | Status: DC | PRN
Start: 2016-04-01 — End: 2016-04-01
  Administered 2016-04-01: 4 mg via INTRAVENOUS

## 2016-04-01 MED ORDER — LACTATED RINGERS IV SOLN
INTRAVENOUS | Status: DC
  Administered 2016-04-01 (×2): via INTRAVENOUS

## 2016-04-01 MED ORDER — FENTANYL CITRATE (PF) 100 MCG/2ML IJ SOLN
INTRAMUSCULAR | Status: AC
  Filled 2016-04-01: qty 2

## 2016-04-01 MED ORDER — HYDROCODONE-ACETAMINOPHEN 5-325 MG PO TABS: 1.0000 | ORAL_TABLET | Freq: Four times a day (QID) | ORAL | 0 refills | Status: DC | PRN

## 2016-04-01 MED ORDER — CEFAZOLIN SODIUM-DEXTROSE 2-4 GM/100ML-% IV SOLN
2.0000 g | INTRAVENOUS | Status: AC
  Administered 2016-04-01: 2 g via INTRAVENOUS

## 2016-04-01 MED ORDER — OXYCODONE HCL 5 MG PO TABS: 5.0000 mg | ORAL_TABLET | Freq: Once | ORAL | Status: DC | PRN

## 2016-04-01 MED ORDER — DEXTROSE 5 % IV SOLN
INTRAVENOUS | Status: DC | PRN
  Administered 2016-04-01: 25 ug/min via INTRAVENOUS

## 2016-04-01 MED ORDER — FENTANYL CITRATE (PF) 100 MCG/2ML IJ SOLN: 25.0000 ug | INTRAMUSCULAR | Status: DC | PRN

## 2016-04-01 MED ORDER — METHYLENE BLUE 0.5 % INJ SOLN
INTRAVENOUS | Status: AC
  Filled 2016-04-01: qty 10

## 2016-04-01 MED ORDER — MIDAZOLAM HCL 2 MG/2ML IJ SOLN
1.0000 mg | Freq: Once | INTRAMUSCULAR | Status: AC
  Administered 2016-04-01: 1 mg via INTRAVENOUS

## 2016-04-01 MED ORDER — LACTATED RINGERS IV SOLN: INTRAVENOUS | Status: DC

## 2016-04-01 MED ORDER — SODIUM CHLORIDE 0.9 % IJ SOLN
INTRAVENOUS | Status: DC | PRN
  Administered 2016-04-01: 5 mL

## 2016-04-01 MED ORDER — CHLORHEXIDINE GLUCONATE CLOTH 2 % EX PADS: 6.0000 | MEDICATED_PAD | Freq: Once | CUTANEOUS | Status: DC

## 2016-04-01 MED ORDER — BUPIVACAINE-EPINEPHRINE (PF) 0.5% -1:200000 IJ SOLN
INTRAMUSCULAR | Status: DC | PRN
  Administered 2016-04-01: 30 mL via PERINEURAL

## 2016-04-01 MED ORDER — BUPIVACAINE HCL (PF) 0.25 % IJ SOLN
INTRAMUSCULAR | Status: AC
  Filled 2016-04-01: qty 30

## 2016-04-01 MED ORDER — CELECOXIB 200 MG PO CAPS
400.0000 mg | ORAL_CAPSULE | ORAL | Status: AC
  Administered 2016-04-01: 400 mg via ORAL
  Filled 2016-04-01: qty 2

## 2016-04-01 MED ORDER — LIDOCAINE HCL (CARDIAC) 20 MG/ML IV SOLN
INTRAVENOUS | Status: DC | PRN
  Administered 2016-04-01: 40 mg via INTRAVENOUS

## 2016-04-01 MED ORDER — BUPIVACAINE HCL 0.25 % IJ SOLN
INTRAMUSCULAR | Status: DC | PRN
  Administered 2016-04-01: 14 mL

## 2016-04-01 MED ORDER — TECHNETIUM TC 99M SULFUR COLLOID FILTERED
1.0000 | Freq: Once | INTRAVENOUS | Status: AC | PRN
  Administered 2016-04-01: 1 via INTRADERMAL

## 2016-04-01 MED ORDER — 0.9 % SODIUM CHLORIDE (POUR BTL) OPTIME
TOPICAL | Status: DC | PRN
  Administered 2016-04-01: 1000 mL

## 2016-04-01 MED ORDER — PROPOFOL 10 MG/ML IV BOLUS
INTRAVENOUS | Status: AC
  Filled 2016-04-01: qty 20

## 2016-04-01 MED ORDER — PROPOFOL 10 MG/ML IV BOLUS
INTRAVENOUS | Status: DC | PRN
  Administered 2016-04-01: 170 mg via INTRAVENOUS

## 2016-04-01 MED ORDER — SODIUM CHLORIDE 0.9 % IJ SOLN
INTRAMUSCULAR | Status: AC
  Filled 2016-04-01: qty 10

## 2016-04-01 MED ORDER — PHENYLEPHRINE HCL 10 MG/ML IJ SOLN
INTRAMUSCULAR | Status: DC | PRN
  Administered 2016-04-01 (×3): 40 ug via INTRAVENOUS

## 2016-04-01 MED ORDER — MIDAZOLAM HCL 2 MG/2ML IJ SOLN
INTRAMUSCULAR | Status: AC
Start: 2016-04-01 — End: 2016-04-01
  Filled 2016-04-01: qty 2

## 2016-04-01 MED ORDER — OXYCODONE HCL 5 MG/5ML PO SOLN: 5.0000 mg | Freq: Once | ORAL | Status: DC | PRN

## 2016-04-01 MED ORDER — ACETAMINOPHEN 500 MG PO TABS
1000.0000 mg | ORAL_TABLET | ORAL | Status: AC
  Administered 2016-04-01: 1000 mg via ORAL
  Filled 2016-04-01: qty 2

## 2016-04-01 MED ORDER — GABAPENTIN 300 MG PO CAPS
300.0000 mg | ORAL_CAPSULE | ORAL | Status: AC
  Administered 2016-04-01: 300 mg via ORAL
  Filled 2016-04-01: qty 1

## 2016-04-01 MED ORDER — FENTANYL CITRATE (PF) 100 MCG/2ML IJ SOLN
50.0000 ug | Freq: Once | INTRAMUSCULAR | Status: AC
  Administered 2016-04-01: 50 ug via INTRAVENOUS

## 2016-04-01 SURGICAL SUPPLY — 60 items
APPLIER CLIP 9.375 MED OPEN (MISCELLANEOUS) ×3
BINDER BREAST LRG (GAUZE/BANDAGES/DRESSINGS) IMPLANT
BINDER BREAST XLRG (GAUZE/BANDAGES/DRESSINGS) IMPLANT
BLADE SURG 15 STRL LF DISP TIS (BLADE) ×1 IMPLANT
BLADE SURG 15 STRL SS (BLADE) ×2
CANISTER SUCT 3000ML PPV (MISCELLANEOUS) ×3 IMPLANT
CHLORAPREP W/TINT 26ML (MISCELLANEOUS) ×3 IMPLANT
CLIP APPLIE 9.375 MED OPEN (MISCELLANEOUS) ×1 IMPLANT
CONT SPEC 4OZ CLIKSEAL STRL BL (MISCELLANEOUS) ×3 IMPLANT
COVER PROBE W GEL 5X96 (DRAPES) ×3 IMPLANT
COVER SURGICAL LIGHT HANDLE (MISCELLANEOUS) ×3 IMPLANT
DERMABOND ADVANCED (GAUZE/BANDAGES/DRESSINGS) ×2
DERMABOND ADVANCED .7 DNX12 (GAUZE/BANDAGES/DRESSINGS) ×1 IMPLANT
DEVICE DUBIN SPECIMEN MAMMOGRA (MISCELLANEOUS) ×3 IMPLANT
DRAPE CHEST BREAST 15X10 FENES (DRAPES) ×3 IMPLANT
DRAPE PROXIMA HALF (DRAPES) ×3 IMPLANT
DRAPE UTILITY XL STRL (DRAPES) ×3 IMPLANT
DRSG PAD ABDOMINAL 8X10 ST (GAUZE/BANDAGES/DRESSINGS) ×3 IMPLANT
ELECT CAUTERY BLADE 6.4 (BLADE) ×3 IMPLANT
ELECT REM PT RETURN 9FT ADLT (ELECTROSURGICAL) ×3
ELECTRODE REM PT RTRN 9FT ADLT (ELECTROSURGICAL) ×1 IMPLANT
GAUZE SPONGE 4X4 12PLY STRL (GAUZE/BANDAGES/DRESSINGS) ×3 IMPLANT
GAUZE SPONGE 4X4 12PLY STRL LF (GAUZE/BANDAGES/DRESSINGS) ×3 IMPLANT
GLOVE BIOGEL PI IND STRL 6.5 (GLOVE) ×1 IMPLANT
GLOVE BIOGEL PI IND STRL 7.0 (GLOVE) ×2 IMPLANT
GLOVE BIOGEL PI INDICATOR 6.5 (GLOVE) ×2
GLOVE BIOGEL PI INDICATOR 7.0 (GLOVE) ×4
GLOVE EUDERMIC 7 POWDERFREE (GLOVE) ×3 IMPLANT
GLOVE SURG SS PI 6.0 STRL IVOR (GLOVE) ×3 IMPLANT
GLOVE SURG SS PI 7.0 STRL IVOR (GLOVE) ×6 IMPLANT
GLOVE SURG SS PI 8.0 STRL IVOR (GLOVE) ×3 IMPLANT
GOWN STRL REUS W/ TWL LRG LVL3 (GOWN DISPOSABLE) ×2 IMPLANT
GOWN STRL REUS W/ TWL XL LVL3 (GOWN DISPOSABLE) ×1 IMPLANT
GOWN STRL REUS W/TWL LRG LVL3 (GOWN DISPOSABLE) ×4
GOWN STRL REUS W/TWL XL LVL3 (GOWN DISPOSABLE) ×2
ILLUMINATOR WAVEGUIDE N/F (MISCELLANEOUS) IMPLANT
KIT BASIN OR (CUSTOM PROCEDURE TRAY) ×3 IMPLANT
KIT MARKER MARGIN INK (KITS) ×3 IMPLANT
LIGHT WAVEGUIDE WIDE FLAT (MISCELLANEOUS) IMPLANT
NDL SAFETY ECLIPSE 18X1.5 (NEEDLE) ×1 IMPLANT
NEEDLE FILTER BLUNT 18X 1/2SAF (NEEDLE) ×2
NEEDLE FILTER BLUNT 18X1 1/2 (NEEDLE) ×1 IMPLANT
NEEDLE HYPO 18GX1.5 SHARP (NEEDLE) ×2
NEEDLE HYPO 25X1 1.5 SAFETY (NEEDLE) ×6 IMPLANT
NS IRRIG 1000ML POUR BTL (IV SOLUTION) ×3 IMPLANT
PACK SURGICAL SETUP 50X90 (CUSTOM PROCEDURE TRAY) ×3 IMPLANT
PAD ABD 8X10 STRL (GAUZE/BANDAGES/DRESSINGS) ×3 IMPLANT
PENCIL BUTTON HOLSTER BLD 10FT (ELECTRODE) ×3 IMPLANT
SPONGE LAP 18X18 X RAY DECT (DISPOSABLE) ×3 IMPLANT
SPONGE LAP 4X18 X RAY DECT (DISPOSABLE) ×6 IMPLANT
SUT MNCRL AB 4-0 PS2 18 (SUTURE) ×6 IMPLANT
SUT SILK 2 0 SH (SUTURE) ×6 IMPLANT
SUT VIC AB 3-0 SH 18 (SUTURE) ×3 IMPLANT
SYR BULB 3OZ (MISCELLANEOUS) ×3 IMPLANT
SYR CONTROL 10ML LL (SYRINGE) ×3 IMPLANT
TOWEL OR 17X24 6PK STRL BLUE (TOWEL DISPOSABLE) ×3 IMPLANT
TOWEL OR 17X26 10 PK STRL BLUE (TOWEL DISPOSABLE) IMPLANT
TUBE CONNECTING 12'X1/4 (SUCTIONS) ×1
TUBE CONNECTING 12X1/4 (SUCTIONS) ×2 IMPLANT
YANKAUER SUCT BULB TIP NO VENT (SUCTIONS) ×3 IMPLANT

## 2016-04-01 NOTE — Transfer of Care (Signed)
Immediate Anesthesia Transfer of Care Note  Patient: Cynthia [name]  Procedure(s) Performed: Procedure(s): RADIOACTIVE SEED GUIDED RIGHT BREAST LUMPECTOMY WITH RIGHT AXILLARY SENTINEL LYMPH NODE BIOPSY. (Right)  Patient Location: PACU  Anesthesia Type:General and Regional  Level of Consciousness: awake, alert  and oriented  Airway & Oxygen Therapy: Patient Spontanous Breathing and Patient connected to nasal cannula oxygen  Post-op Assessment: Report given to RN and Post -op Vital signs reviewed and stable  Post vital signs: Reviewed and stable  Last Vitals:  Vitals:   04/01/16 0644 04/01/16 0933  BP: 113/62 (P) 114/74  Pulse: (!) 57 (P) 66  Resp: (!) 24   Temp: 36.8 C     Last Pain:  Vitals:   04/01/16 0644  TempSrc: Oral      Patients Stated Pain Goal: 3 (69/79/48 0165)  Complications: No apparent anesthesia complications

## 2016-04-01 NOTE — Discharge Instructions (Signed)
[facility] °Office Phone Number [phone] ° °BREAST BIOPSY/ PARTIAL MASTECTOMY: POST OP INSTRUCTIONS ° °Always review your discharge instruction sheet given to you by the facility where your surgery was performed. ° °IF YOU HAVE DISABILITY OR FAMILY LEAVE FORMS, YOU MUST BRING THEM TO THE OFFICE FOR PROCESSING.  DO NOT GIVE THEM TO YOUR DOCTOR. ° °1. A prescription for pain medication may be given to you upon discharge.  Take your pain medication as prescribed, if needed.  If narcotic pain medicine is not needed, then you may take acetaminophen (Tylenol) or ibuprofen (Advil) as needed. °2. Take your usually prescribed medications unless otherwise directed °3. If you need a refill on your pain medication, please contact your pharmacy.  They will contact our office to request authorization.  Prescriptions will not be filled after 5pm or on week-ends. °4. You should eat very light the first 24 hours after surgery, such as soup, crackers, pudding, etc.  Resume your normal diet the day after surgery. °5. Most patients will experience some swelling and bruising in the breast.  Ice packs and a good support bra will help.  Swelling and bruising can take several days to resolve.  °6. It is common to experience some constipation if taking pain medication after surgery.  Increasing fluid intake and taking a stool softener will usually help or prevent this problem from occurring.  A mild laxative (Milk of Magnesia or Miralax) should be taken according to package directions if there are no bowel movements after 48 hours. °7. Unless discharge instructions indicate otherwise, you may remove your bandages 24-48 hours after surgery, and you may shower at that time.  You may have steri-strips (small skin tapes) in place directly over the incision.  These strips should be left on the skin for 7-10 days.  If your surgeon used skin glue on the incision, you may shower in 24 hours.  The glue will flake off over the  next 2-3 weeks.  Any sutures or staples will be removed at the office during your follow-up visit. °8. ACTIVITIES:  You may resume regular daily activities (gradually increasing) beginning the next day.  Wearing a good support bra or sports bra minimizes pain and swelling.  You may have sexual intercourse when it is comfortable. °a. You may drive when you no longer are taking prescription pain medication, you can comfortably wear a seatbelt, and you can safely maneuver your car and apply brakes. °b. RETURN TO WORK:  ______________________________________________________________________________________ °9. You should see your doctor in the office for a follow-up appointment approximately two weeks after your surgery.  Your doctor’s nurse will typically make your follow-up appointment when she calls you with your pathology report.  Expect your pathology report 2-3 business days after your surgery.  You may call to check if you do not hear from us after three days. °10. OTHER INSTRUCTIONS: _______________________________________________________________________________________________ _____________________________________________________________________________________________________________________________________ °_____________________________________________________________________________________________________________________________________ °_____________________________________________________________________________________________________________________________________ ° °WHEN TO CALL YOUR DOCTOR: °1. Fever over 101.0 °2. Nausea and/or vomiting. °3. Extreme swelling or bruising. °4. Continued bleeding from incision. °5. Increased pain, redness, or drainage from the incision. ° °The clinic staff is available to answer your questions during regular business hours.  Please don’t hesitate to call and ask to speak to one of the nurses for clinical concerns.  If you have a medical emergency, go to the nearest  emergency room or call 911.  A surgeon from [facility] is always on call at the hospital. ° °For further questions, please visit [email or web address]  °

## 2016-04-01 NOTE — Anesthesia Procedure Notes (Signed)
Anesthesia Regional Block: Pectoralis block   Pre-Anesthetic Checklist: ,, timeout performed, Correct Patient, Correct Site, Correct Laterality, Correct Procedure, Correct Position, risks and benefits discussed, surgical consent, pre-op evaluation,  At surgeon's request and post-op pain management  Laterality: Right  Prep: chloraprep       Needles:  Injection technique: Single-shot  Needle Type: Echogenic Needle          Additional Needles:   Procedures: ultrasound guided,,,,,,,,  Narrative:  Start time: 04/01/2016 7:42 AM End time: 04/01/2016 7:49 AM Injection made incrementally with aspirations every 5 mL.  Performed by: Personally   Additional Notes: H+P and labs reviewed, risks and benefits discussed with patient, procedure tolerated well without complications

## 2016-04-01 NOTE — Op Note (Signed)
Patient Name:           Cynthia [name]   Date of Surgery:        04/01/2016  Pre op Diagnosis:      Invasive ductal carcinoma right breast, upper inner quadrant, Estrogen receptor positive, clinical stage T1b, , N0  Post op Diagnosis:    Same  Procedure:                 Inject blue dye right breast, right breast lumpectomy with radioactive seed localization, right axillary deep sentinel lymph node biopsy  Surgeon:                     Edsel Petrin. Dalbert Batman, M.D., FACS  Assistant:                      OR staff   Indication for Assistant: n/a  Operative Indications:         This is a very pleasant 71 year old woman from [place] who  is referred by [name] at [facility] mammography for evaluation and management of an invasive ductal carcinoma of the right breast. [name] is her PCP. She was seen in the [facility] recently by Dr. [name], Dr. [name], and me.       She has no prior history of breast problems. Last mammogram was about 2-1/2 years ago since she moved from [place]. Screening mammograms show a 6 mm mass in the right breast upper inner quadrant 3:00 position. Image guided biopsy shows grade 1 invasive ductal carcinoma, ER 100%, PR 95%, KS 6710%, HER-2 negative. She has done well since the biopsy.      Comorbidities reveal hypothyroidism, degenerative disc disease, borderline diabetes, hypertension. Family history reveals sister died age 68 for metastatic breast cancer. A niece had breast cancer, bilateral mastectomies, su           Genetic testing is pending.         We had a long talk about management of breast cancer in general and her specific situation. We discussed the options of lumpectomy and sentinel node biopsy, mastectomy with or without reconstruction. She is strongly motivated for breast conservation surgery I think she is an excellent candidate for that.       She'll be scheduled for right breast lumpectomy with radioactive seed localization, inject blue dye, right  axillary deep sentinel lymph node mapping and biopsy. I discussed the indications, details, techniques, and numerous risk of the surgery with her and her husband. They're aware of the risk of bleeding, infection, cosmetic deformity, reoperation for positive margins or positive nodes, nerve damage with chronic pain or numbness, arm swelling and arm numbness, and other unforeseen problems. They understand these issues well. At this time all of her questions are answered. She agrees with this plan.         She knows that decisions and planning regarding radiation therapy and systemic therapy will be made after the final pathology is in. Dr. [name] is checking to see if she is a candidate for genetics. Dr. [name] is considering hyperfractionation.        We are going to try very hard to do her surgery before April 7 because she wants to leave for an important vacation on May 7.  Operative Findings:      Right pectoral block and nuclear medicine injection were performed in the holding area.  I could hear the audible signal of the radioactive seed in the  breast using the neoprobe in the holding area.  The operating room I was able to make a circumareolar incision at the areolar margin, hidden scar technique.  The lumpectomy specimen looked good.  I thought the seed and  marker clip might be a little bit close to the superior margin so I reexcised the superior margin.  I found 2 sentinel lymph nodes and one non-sentinel lymph node was also sent.  Procedure in Detail:          Following the induction of general LMA anesthesia a surgical timeout was performed.  Intravenous antibiotics were given.  Following alcohol prep I injected 5 mL of dilute methylene blue into the right breast subareolar area and massaged the breast for a few minutes.      The right breast and chest wall and axilla were then prepped and draped in a sterile fashion.  0.5% Marcaine with epinephrine was used as a local infiltration  anesthetic.  Using the neoprobe I found that the radioactive signal was actually close to the areolar margin in the upper inner quadrant.  I therefore made incision at the areolar margin in the upper inner quadrant.  The lumpectomy was performed using the neoprobe frequently.  The specimen was removed and marked with silk sutures and a 6 color ink kit to orient the pathologist.  Specimen mammogram looked good.  I did re-excise the superior  margin conservatively.  The wound was irrigated with saline.  Hemostasis was excellent.  The lumpectomy cavity margins were marked with metal clips and the lumpectomy cavity was closed with multiple interrupted 3-0 Vicryl sutures and the skin closed with a running subcuticular 4-0 Monocryl and Dermabond.       Transverse incision was made in the right axilla.  The neoprobe on the technetium setting was used.  Dissection was carried down through the clavipectoral fascia.  I found 3 lymph nodes.  2 were hot and blue and 1 that was removed incidentally of was not a sentinel lymph node.  After this was done there was no more significant radioactivity.  The wound was irrigated.  Hemostasis was excellent.  There was one small vein and one tiny arterial bleeder that were controlled metal clips.  The clavipectoral fascia was closed with 3-0 Vicryl sutures and the skin closed with running septic 4-0 Monocryl and Dermabond.  Dry bandages and a breast binder were placed.  The patient tolerated the procedure well was taken to PACU in stable condition.  EBL 25 mL.  Counts correct.  Complications none.   The patient was given a prescription for Norco for pain.  I logged on to the [facility] website to review her prescription history.      Edsel Petrin. Dalbert Batman, M.D., FACS General and Minimally Invasive Surgery Breast and Colorectal Surgery  04/01/2016 9:31 AM

## 2016-04-01 NOTE — Anesthesia Procedure Notes (Signed)
Procedure Name: LMA Insertion Date/Time: 04/01/2016 8:14 AM Performed by: Clearnce Sorrel Pre-anesthesia Checklist: Patient identified, Emergency Drugs available, Suction available, Patient being monitored and Timeout performed Patient Re-evaluated:Patient Re-evaluated prior to inductionOxygen Delivery Method: Circle system utilized Preoxygenation: Pre-oxygenation with 100% oxygen Intubation Type: IV induction LMA: LMA inserted LMA Size: 4.0 Number of attempts: 1 Placement Confirmation: positive ETCO2 and breath sounds checked- equal and bilateral Tube secured with: Tape Dental Injury: Teeth and Oropharynx as per pre-operative assessment

## 2016-04-01 NOTE — Anesthesia Preprocedure Evaluation (Signed)
Anesthesia Evaluation  Patient identified by MRN, date of birth, ID band Patient awake    Reviewed: Allergy & Precautions, NPO status , Patient's Chart, lab work & pertinent test results  History of Anesthesia Complications Negative for: history of anesthetic complications  Airway Mallampati: II  TM Distance: >3 FB Neck ROM: Full    Dental  (+) Teeth Intact   Pulmonary neg pulmonary ROS,    breath sounds clear to auscultation       Cardiovascular hypertension, Pt. on medications and Pt. on home beta blockers  Rhythm:Regular     Neuro/Psych PSYCHIATRIC DISORDERS Anxiety Depression negative neurological ROS     GI/Hepatic GERD  ,  Endo/Other  diabetesHypothyroidism   Renal/GU      Musculoskeletal  (+) Arthritis ,   Abdominal   Peds  Hematology   Anesthesia Other Findings   Reproductive/Obstetrics                             Anesthesia Physical Anesthesia Plan  ASA: II  Anesthesia Plan: General and Regional   Post-op Pain Management:    Induction: Intravenous  Airway Management Planned: LMA  Additional Equipment: None  Intra-op Plan:   Post-operative Plan: Extubation in OR  Informed Consent: I have reviewed the patients History and Physical, chart, labs and discussed the procedure including the risks, benefits and alternatives for the proposed anesthesia with the patient or authorized representative who has indicated his/her understanding and acceptance.   Dental advisory given  Plan Discussed with: CRNA and Surgeon  Anesthesia Plan Comments:         Anesthesia Quick Evaluation

## 2016-04-01 NOTE — Interval H&P Note (Signed)
History and Physical Interval Note:  04/01/2016 7:22 AM  Cynthia [name]  has presented today for surgery, with the diagnosis of RIGHT BREAST CANCER, UPPER INNER QUADRANT  The various methods of treatment have been discussed with the patient and family. After consideration of risks, benefits and other options for treatment, the patient has consented to  Procedure(s): RIGHT BREAST LUMPECTOMY WITH RADIOACTIVE SEED AND RIGHT AXILLARY DEEP SENTINEL LYMPH NODE BIOPSY AND BLUE DYE INJECTION RIGHT BREAST (Right) as a surgical intervention .  The patient's history has been reviewed, patient examined, no change in status, stable for surgery.  I have reviewed the patient's chart and labs.  Questions were answered to the patient's satisfaction.     Adin Hector

## 2016-04-02 ENCOUNTER — Encounter (HOSPITAL_COMMUNITY): Payer: Self-pay | Admitting: General Surgery

## 2016-04-04 NOTE — Anesthesia Postprocedure Evaluation (Addendum)
Anesthesia Post Note  Patient: Cynthia [name]  Procedure(s) Performed: Procedure(s) (LRB): RADIOACTIVE SEED GUIDED RIGHT BREAST LUMPECTOMY WITH RIGHT AXILLARY SENTINEL LYMPH NODE BIOPSY. (Right)  Patient location during evaluation: PACU Anesthesia Type: Regional Level of consciousness: awake and alert Pain management: pain level controlled Vital Signs Assessment: post-procedure vital signs reviewed and stable Respiratory status: spontaneous breathing, nonlabored ventilation, respiratory function stable and patient connected to nasal cannula oxygen Cardiovascular status: blood pressure returned to baseline and stable Postop Assessment: no signs of nausea or vomiting Anesthetic complications: no       Last Vitals:  Vitals:   04/01/16 1003 04/01/16 1016  BP: 100/62 97/61  Pulse: 63 64  Resp: 16 16  Temp: 36.6 C     Last Pain:  Vitals:   04/01/16 1016  TempSrc:   PainSc: 0-No pain                 Kasara Schomer

## 2016-04-07 NOTE — Progress Notes (Signed)
.   Location of Breast Cancer: Right Breast Upper Inner Quadrant  Histology per Pathology Report: 03/10/2016: Biopsy right breast =Invasive ductal carcinoma grade I,  Receptor Status: ER(1 %+), PR (95%+), Her2-neu (neg ratio=1.40), Ki-67(10%)  Did patient present with symptoms (if so, please note symptoms) or was this found on screening mammography?: routine screening 12/26/15, recalled for diagnostic mammogram 03/07/16  Past/Anticipated interventions by surgeon, if VAP:[id number] 04/01/2016: 1. Breast, lumpectomy, Right - INVASIVE DUCTAL CARCINOMA, GRADE I/III, SPANNING 1.0 CM. - DUCTAL CARCINOMA IN SITU, INTERMEDIATE GRADE.- THE SURGICAL RESECTION MARGINS ARE NEGATIVE FOR CARCINOMA.- SEE ONCOLOGY TABLE BELOW. 2. Lymph node, sentinel, biopsy, Right Axillary #1 - THERE IS NO EVIDENCE OF CARCINOMA IN 1 OF 1 LYMPH NODE (0/1). 3. Breast, excision, Right Superior Margin - ATYPICAL DUCTAL HYPERPLASIA WITH CALCIFICATIONS.- SEE COMMENT. 4. Lymph node, biopsy, Right Axillary non-sentinel - THERE IS NO EVIDENCE OF CARCINOMA IN 1 OF 1 LYMPH NODE (0/1). 5. Lymph node, sentinel, biopsy, Right Axillary #2 - THERE IS NO EVIDENCE OF CARCINOMA IN 1 OF 1 LYMPH NODE (0/1). 6. Lymph node, sentinel, biopsy, Right Axillary #3 - THERE IS NO EVIDENCE OF CARCINOMA IN 1 OF 1 LYMPH NODE (0/1).  Past/Anticipated interventions by medical oncology, if any: Chemotherapy : Dr. [name], MD, 03/19/16:Multidisciplinary clinic,  Genetic testing pending, adjuvant anti-estrogens to follow after completion of local treatment  Lymphedema issues, if any:  No  Pain issues, if any: No, occasional twinge   SAFETY ISSUES: No  Prior radiation? NO  Pacemaker/ICD? NO  Is the patient on methotrexate? NO  Current Complaints / other details:  Married, menarche age 48, [id number], 1st live birth age 76, Depression/anxiety,thyroid disease, HTN, no tobacco or drug use, drinks alcohol occasionally Sister Breast cancer, dx age 57, deceased,  Brother Leukemia, dx age 61, Sister colon cancer dx age 16,living Father died after fall age 1,hx cerebral palsey,  1 niece dx breast cancer age early 10's  BP 117/64 (BP Location: Left Arm, Patient Position: Sitting, Cuff Size: Normal)   Pulse 65   Temp 97.9 F (36.6 C) (Oral)   Resp 20   Ht '5\' 6"'$  (1.676 m)   Wt 177 lb 9.6 oz (80.6 kg)   BMI 28.67 kg/m   Wt Readings from Last 3 Encounters:  04/14/16 177 lb 9.6 oz (80.6 kg)  03/28/16 179 lb 1.6 oz (81.2 kg)  03/19/16 181 lb (82.1 kg)      Rebecca Eaton, RN 04/07/2016,10:27 AM

## 2016-04-14 ENCOUNTER — Ambulatory Visit
Admission: RE | Admit: 2016-04-14 | Discharge: 2016-04-14 | Disposition: A | Discharge status: Home / Self Care | Payer: Medicare PPO | Source: Ambulatory Visit | Attending: Radiation Oncology | Admitting: Radiation Oncology

## 2016-04-14 ENCOUNTER — Encounter: Payer: Self-pay | Admitting: Radiation Oncology

## 2016-04-14 DIAGNOSIS — G2581 Restless legs syndrome: Secondary | ICD-10-CM | POA: Diagnosis not present

## 2016-04-14 DIAGNOSIS — E119 Type 2 diabetes mellitus without complications: Secondary | ICD-10-CM | POA: Insufficient documentation

## 2016-04-14 DIAGNOSIS — F329 Major depressive disorder, single episode, unspecified: Secondary | ICD-10-CM | POA: Diagnosis not present

## 2016-04-14 DIAGNOSIS — C50211 Malignant neoplasm of upper-inner quadrant of right female breast: Secondary | ICD-10-CM

## 2016-04-14 DIAGNOSIS — Z170 Estrogen receptor positive status [ER+]: Secondary | ICD-10-CM | POA: Insufficient documentation

## 2016-04-14 DIAGNOSIS — E039 Hypothyroidism, unspecified: Secondary | ICD-10-CM | POA: Diagnosis not present

## 2016-04-14 DIAGNOSIS — I10 Essential (primary) hypertension: Secondary | ICD-10-CM | POA: Diagnosis not present

## 2016-04-14 DIAGNOSIS — K589 Irritable bowel syndrome without diarrhea: Secondary | ICD-10-CM | POA: Diagnosis not present

## 2016-04-14 DIAGNOSIS — Z9889 Other specified postprocedural states: Secondary | ICD-10-CM | POA: Diagnosis not present

## 2016-04-14 DIAGNOSIS — F40240 Claustrophobia: Secondary | ICD-10-CM | POA: Diagnosis not present

## 2016-04-14 DIAGNOSIS — M5136 Other intervertebral disc degeneration, lumbar region: Secondary | ICD-10-CM | POA: Diagnosis not present

## 2016-04-14 DIAGNOSIS — K219 Gastro-esophageal reflux disease without esophagitis: Secondary | ICD-10-CM | POA: Diagnosis not present

## 2016-04-14 DIAGNOSIS — G4700 Insomnia, unspecified: Secondary | ICD-10-CM | POA: Diagnosis not present

## 2016-04-14 DIAGNOSIS — D0511 Intraductal carcinoma in situ of right breast: Secondary | ICD-10-CM | POA: Diagnosis not present

## 2016-04-14 DIAGNOSIS — Z7984 Long term (current) use of oral hypoglycemic drugs: Secondary | ICD-10-CM | POA: Diagnosis not present

## 2016-04-14 DIAGNOSIS — F419 Anxiety disorder, unspecified: Secondary | ICD-10-CM | POA: Diagnosis not present

## 2016-04-14 NOTE — Progress Notes (Signed)
Please see the Nurse Progress Note in the MD Initial Consult Encounter for this patient. 

## 2016-04-14 NOTE — Progress Notes (Signed)
Radiation Oncology         (336) [phone] ________________________________  Name: Cynthia [name] MRN: [id number]  Date: 04/14/2016  DOB: [birth year]  WL:NLGX, Cynthia Leaver, MD  Magrinat, [name], MD     REFERRING PHYSICIAN: Magrinat, [name], MD   DIAGNOSIS: The encounter diagnosis was Malignant neoplasm of upper-inner quadrant of right breast in female, estrogen receptor positive ([place]). 71 y.o. 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  HISTORY OF PRESENT ILLNESS: Cynthia [name] is a 71 y.o. female who was seen in multidisciplinary breast clinic on 03/19/16 for a newly diagnosed right breast cancer. Screening mammography detected asymmetry in the right breast. Ultrasound/diagnostic mammography revealed a 78m oval mass at 3'oclock position in the right breast . Ultrasound guided biopsy was performed on 03/10/16 and pathology returned showing invasive ductal carcinoma, grade 1. Hormone receptor status is ER (100%), PR (95%), HER-2 (negative) and Ki67 (10%).  Since treatment, she has undergone right breast lumpectomy with sentinel lymph node biopsy on 04/01/16. This showed invasive ductal carcinoma, grade I/III, spanning 1.0 cm. There was also intermediate grade ductal carcinoma in situ. There were four lymph nodes negative for metastasis. Excision of the right superior margin showed atypical ductal hyperplasia with calcifications, and her margins in total were negative. She comes today to discuss the role for radiotherapy in the adjuvant setting.   PREVIOUS RADIATION THERAPY: No   PAST MEDICAL HISTORY:  Past Medical History:  Diagnosis Date  . Anxiety   . Arthritis   . [place]    breast cance  . DDD (degenerative disc disease), lumbar   . Depression   . Diabetes mellitus without complication ([facility] [place]    Takes metformin but has never been told she was diabetic  . GERD (gastroesophageal reflux disease)    OTC tums  .  Hypertension   . Hypothyroidism   . IBS (irritable bowel syndrome)   . Insomnia   . Restless leg syndrome   . Thyroid disease        PAST SURGICAL HISTORY: Past Surgical History:  Procedure Laterality Date  . BREAST LUMPECTOMY WITH RADIOACTIVE SEED AND SENTINEL LYMPH NODE BIOPSY Right 04/01/2016   Procedure: RADIOACTIVE SEED GUIDED RIGHT BREAST LUMPECTOMY WITH RIGHT AXILLARY SENTINEL LYMPH NODE BIOPSY.;  Surgeon: HFanny Skates MD;  [facility]  Service: General;  Laterality: Right;  . CATARACT EXTRACTION    . CHOLECYSTECTOMY       FAMILY HISTORY:  Family History  Problem Relation Age of Onset  . Breast cancer Sister   . Leukemia Brother   . Colon cancer Sister      SOCIAL HISTORY:  reports that she has never smoked. She has never used smokeless tobacco. She reports that she drinks alcohol. She reports that she does not use drugs. [place]  She is retired and is the primary caregiver for her husband who is currently under treatment for a slow growing brain tumor.   ALLERGIES: No known allergies   MEDICATIONS:  Current Outpatient Prescriptions  Medication Sig Dispense Refill  . acetaminophen (TYLENOL) 500 MG tablet Take 500 mg by mouth every 6 (six) hours as needed (for headaches.).    .Marland KitchenALPRAZolam (XANAX) 0.5 MG tablet Take 0.5 mg by mouth at bedtime.    .Marland Kitchenatenolol (TENORMIN) 25 MG tablet Take 25 mg by mouth 2 (two) times daily.    . Choline Fenofibrate (FENOFIBRIC ACID) 135 MG  CPDR Take 135 mg by mouth daily.    . FERROUS SULFATE PO Take 1 tablet by mouth every evening.    . imipramine (TOFRANIL) 50 MG tablet Take 50 mg by mouth at bedtime.    . levothyroxine (SYNTHROID, LEVOTHROID) 112 MCG tablet Take 112 mcg by mouth at bedtime.    . metFORMIN (GLUCOPHAGE) 500 MG tablet Take 500 mg by mouth 2 (two) times daily.    . psyllium (REGULOID) 0.52 g capsule Take 2.08 g by mouth daily at 2 PM.    . traZODone (DESYREL) 150  MG tablet Take 150 mg by mouth at bedtime.    . triamterene-hydrochlorothiazide (MAXZIDE-25) 37.5-25 MG tablet Take 1 tablet by mouth daily.    . traMADol (ULTRAM) 50 MG tablet Take 50 mg by mouth every 6 (six) hours as needed (for back pain.).     No current facility-administered medications for this encounter.    Gynecologic History  Age at first menstrual period? 9 years (approximately)  Are you still having periods? No Approximate date of last period? Cannot remember  If you are still having periods: Are your periods regular? N/A  If you no longer have periods: Have you used hormone replacement? Yes  If YES, for how long? Cannot remember When did you stop? Stopped within last 10 years. Obstetric History:  How many children have you carried to term? 2 Your age at first live birth? 22  Pregnant now or trying to get pregnant? No  Have you used birth control pills or hormone shots for contraception? Yes  If so, for how long (or approximate dates)? 10 years - this is an estimate  Would you be interested in learning more about the options to preserve fertility? N/A Health Maintenance:  Have you ever had a colonoscopy? Yes If yes, date? 07/25/2010  Have you ever had a bone density? Yes If yes, date? 08/26/2013  Date of your last PAP smear? 04/04/2010 Date of your FIRST mammogram? Don't remember  REVIEW OF SYSTEMS: On review of systems, the patient reports that she is doing well overall. Patient denies lymphedema and only notes "an occasional twinge" in her breast. She denies any chest pain, shortness of breath, cough, fevers, chills, night sweats, unintended weight changes. She does report 9 lb intentional weight loss since the first of the year. She denies any bowel or bladder disturbances, and denies abdominal pain, nausea or vomiting. She reports heartburn. She reports back pain associated with a severe degenerated disc. She reports anxiety and claustrophobia concerns. She takes Xanax to manage  her anxiety. She reports thyroid problem and is on thyroid medication. A complete review of systems is obtained and is otherwise negative.   PHYSICAL EXAM:  Wt Readings from Last 3 Encounters:  04/14/16 177 lb 9.6 oz (80.6 kg)  03/28/16 179 lb 1.6 oz (81.2 kg)  03/19/16 181 lb (82.1 kg)   Temp Readings from Last 3 Encounters:  04/14/16 97.9 F (36.6 C) (Oral)  04/01/16 97.8 F (36.6 C)  03/28/16 97.9 F (36.6 C)   BP Readings from Last 3 Encounters:  04/14/16 117/64  04/01/16 97/61  03/28/16 (!) 110/57   Pulse Readings from Last 3 Encounters:  04/14/16 65  04/01/16 64  03/28/16 (!) 59   Pain Assessment Pain Score: 0-No pain0/10  In general this is a well appearing caucasian woman in no acute distress. She's alert and oriented x4 and appropriate throughout the examination. Cardiopulmonary assessment is negative for acute distress and she exhibits normal effort.   She has a well healed right lumpectomy and axillary incision site. There is trace edema inferior to the lumpectomy site.    ECOG = 0  0 - Asymptomatic (Fully active, able to carry on all predisease activities without restriction)  1 - Symptomatic but completely ambulatory (Restricted in physically strenuous activity but ambulatory and able to carry out work of a light or sedentary nature. For example, light housework, office work)  2 - Symptomatic, <50% in bed during the day (Ambulatory and capable of all self care but unable to carry out any work activities. Up and about more than 50% of waking hours)  3 - Symptomatic, >50% in bed, but not bedbound (Capable of only limited self-care, confined to bed or chair 50% or more of waking hours)  4 - Bedbound (Completely disabled. Cannot carry on any self-care. Totally confined to bed or chair)  5 - Death   Oken MM, Creech RH, Tormey DC, et al. (1982). "Toxicity and response criteria of the Eastern Cooperative Oncology Group". Am. J. Clin. Oncol. 5 (6):  649-55    LABORATORY DATA:  Lab Results  Component Value Date   WBC 9.5 03/28/2016   HGB 14.7 03/28/2016   HCT 44.8 03/28/2016   MCV 92.0 03/28/2016   PLT 380 03/28/2016   Lab Results  Component Value Date   NA 139 03/28/2016   K 3.4 (L) 03/28/2016   CL 100 (L) 03/28/2016   CO2 28 03/28/2016   Lab Results  Component Value Date   ALT 10 03/19/2016   AST 15 03/19/2016   ALKPHOS 45 03/19/2016   BILITOT 0.42 03/19/2016      RADIOGRAPHY: Nm Sentinel Node Inj-no Rpt (breast)  Result Date: 04/15/2016 Sulfur colloid was injected intradermally by the nuclear medicine technologist for breast cancer sentinel node localization      IMPRESSION/PLAN: 1. 71 y.o. 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 Dr. Moody reviews the patient's pathology and reviews there was not a role for chemotherapy, and rather, we would recommend proceeding with external radiotherapy to the breast followed by antiestrogen therapy with an AI. We discussed the risks, benefits, short, and long term effects of radiotherapy, and the patient is interested in proceeding. Dr. Moody discusses the delivery and logistics of radiotherapy and confirms his recommendations for 4 weeks of radiotherapy. We will move forward with the simulation and planning process for the first week of May 2018 to accommodate for her vacation, she will begin around May 16th when she returns home from vacation. Written consent is obtained, and the original placed in her chart with a copy provided to the patient. 2. Anxiety and claustrophobia concerns. The patient currently takes Xanax to manage anxiety and will plan to take this medication prior to simulation and/pr radiation treatments.   In a visit lasting 25 minutes, greater than 50% of the time was spent face to face discussing her pathology, as well as her upcoming scheduled vacation and how this will need to be considered when  coordinating the patient's care.   The above documentation reflects my direct findings during this shared patient visit. Please see the separate note by Dr. Moody on this date for the remainder of the patient's plan of care.     Alison C. Perkins, PAC    This document serves as a record of services personally performed by John Moody, MD and Alison Perkins, PA-C. It was created on their behalf by Leslie Hoyt, a   trained medical scribe. The creation of this record is based on the scribe's personal observations and the provider's statements to them. This document has been checked and approved by the attending provider.

## 2016-04-24 ENCOUNTER — Telehealth: Payer: Self-pay | Admitting: *Deleted

## 2016-04-24 NOTE — Telephone Encounter (Signed)
Received call from patient stating she needed to reschedule her appointment for genetics on 4/25.  Rescheduled for 5/15 at 2pm per patient request.

## 2016-04-30 ENCOUNTER — Encounter: Payer: Medicare PPO | Admitting: Genetic Counselor

## 2016-04-30 ENCOUNTER — Other Ambulatory Visit: Payer: Medicare PPO

## 2016-05-09 ENCOUNTER — Ambulatory Visit
Admission: RE | Admit: 2016-05-09 | Discharge: 2016-05-09 | Disposition: A | Discharge status: Home / Self Care | Payer: Medicare PPO | Source: Ambulatory Visit | Attending: Radiation Oncology | Admitting: Radiation Oncology

## 2016-05-09 DIAGNOSIS — Z170 Estrogen receptor positive status [ER+]: Secondary | ICD-10-CM | POA: Diagnosis not present

## 2016-05-09 DIAGNOSIS — K219 Gastro-esophageal reflux disease without esophagitis: Secondary | ICD-10-CM | POA: Diagnosis not present

## 2016-05-09 DIAGNOSIS — C50211 Malignant neoplasm of upper-inner quadrant of right female breast: Secondary | ICD-10-CM

## 2016-05-09 DIAGNOSIS — M5136 Other intervertebral disc degeneration, lumbar region: Secondary | ICD-10-CM | POA: Diagnosis not present

## 2016-05-09 DIAGNOSIS — Z7984 Long term (current) use of oral hypoglycemic drugs: Secondary | ICD-10-CM | POA: Diagnosis not present

## 2016-05-09 DIAGNOSIS — E119 Type 2 diabetes mellitus without complications: Secondary | ICD-10-CM | POA: Diagnosis not present

## 2016-05-09 DIAGNOSIS — F329 Major depressive disorder, single episode, unspecified: Secondary | ICD-10-CM | POA: Diagnosis not present

## 2016-05-09 DIAGNOSIS — F40240 Claustrophobia: Secondary | ICD-10-CM | POA: Diagnosis not present

## 2016-05-09 DIAGNOSIS — F419 Anxiety disorder, unspecified: Secondary | ICD-10-CM | POA: Diagnosis not present

## 2016-05-13 NOTE — Progress Notes (Signed)
  Radiation Oncology         [phone]) [phone] ________________________________  Name: Cynthia [name] MRN: [id number]  Date: 05/09/2016  DOB: 27-Mar-1945   DIAGNOSIS:     ICD-9-CM ICD-10-CM   1. Malignant neoplasm of upper-inner quadrant of right breast in female, estrogen receptor positive ([place]) 174.2 C50.211    V86.0 Z17.0     SIMULATION AND TREATMENT PLANNING NOTE  The patient presented for simulation prior to beginning her course of radiation treatment for her diagnosis of right-sided breast cancer. The patient was placed in a supine position on a breast board. A customized vac-lock bag was constructed and this complex treatment device will be used on a daily basis during her treatment. In this fashion, a CT scan was obtained through the chest area and an isocenter was placed near the chest wall within the breast.  The patient will be planned to receive a course of radiation initially to a dose of 42.5 Gy. This will consist of a whole breast radiotherapy technique. To accomplish this, 2 customized blocks have been designed which will correspond to medial and lateral whole breast tangent fields. This treatment will be accomplished at 2.66 Gy per fraction. A forward planning technique will also be evaluated to determine if this approach improves the plan. It is anticipated that the patient will then receive a 10 Gy boost to the seroma cavity which has been contoured. This will be accomplished at 2.5 Gy per fraction.   This initial treatment will consist of a 3-D conformal technique. The seroma has been contoured as the primary target structure. Additionally, dose volume histograms of both this target as well as the lungs and heart will also be evaluated. Such an approach is necessary to ensure that the target area is adequately covered while the nearby critical  normal structures are adequately spared.  Plan:  The final anticipated total dose therefore will correspond to 52.5  Gy.    _______________________________   Jodelle Gross, MD, PhD

## 2016-05-13 NOTE — Progress Notes (Signed)
  Radiation Oncology         [phone]) [phone] ________________________________  Name: Cynthia [name] MRN: [id number]  Date: 05/09/2016  DOB: [birth year]  Optical Surface Tracking Plan:  Since intensity modulated radiotherapy (IMRT) and 3D conformal radiation treatment methods are predicated on accurate and precise positioning for treatment, intrafraction motion monitoring is medically necessary to ensure accurate and safe treatment delivery.  The ability to quantify intrafraction motion without excessive ionizing radiation dose can only be performed with optical surface tracking. Accordingly, surface imaging offers the opportunity to obtain 3D measurements of patient position throughout IMRT and 3D treatments without excessive radiation exposure.  I am ordering optical surface tracking for this patient's upcoming course of radiotherapy. ________________________________  Kyung Rudd, MD 05/13/2016 11:44 AM    Reference:   Ursula Alert, J, et al. Surface imaging-based analysis of intrafraction motion for breast radiotherapy patients.Journal of [place], n. 6, nov. 2014. ISSN [id number].   Available at: <http://www.jacmp.org/index.php/jacmp/article/view/4957>.

## 2016-05-19 DIAGNOSIS — F40240 Claustrophobia: Secondary | ICD-10-CM | POA: Diagnosis not present

## 2016-05-19 DIAGNOSIS — M5136 Other intervertebral disc degeneration, lumbar region: Secondary | ICD-10-CM | POA: Diagnosis not present

## 2016-05-19 DIAGNOSIS — Z170 Estrogen receptor positive status [ER+]: Secondary | ICD-10-CM | POA: Diagnosis not present

## 2016-05-19 DIAGNOSIS — F329 Major depressive disorder, single episode, unspecified: Secondary | ICD-10-CM | POA: Diagnosis not present

## 2016-05-19 DIAGNOSIS — E119 Type 2 diabetes mellitus without complications: Secondary | ICD-10-CM | POA: Diagnosis not present

## 2016-05-19 DIAGNOSIS — C50211 Malignant neoplasm of upper-inner quadrant of right female breast: Secondary | ICD-10-CM | POA: Diagnosis not present

## 2016-05-19 DIAGNOSIS — F419 Anxiety disorder, unspecified: Secondary | ICD-10-CM | POA: Diagnosis not present

## 2016-05-19 DIAGNOSIS — Z7984 Long term (current) use of oral hypoglycemic drugs: Secondary | ICD-10-CM | POA: Diagnosis not present

## 2016-05-19 DIAGNOSIS — K219 Gastro-esophageal reflux disease without esophagitis: Secondary | ICD-10-CM | POA: Diagnosis not present

## 2016-05-20 ENCOUNTER — Other Ambulatory Visit: Payer: Medicare PPO

## 2016-05-20 ENCOUNTER — Encounter: Payer: Medicare PPO | Admitting: Genetics

## 2016-05-21 ENCOUNTER — Ambulatory Visit
Admission: RE | Admit: 2016-05-21 | Discharge: 2016-05-21 | Disposition: A | Discharge status: Home / Self Care | Payer: Medicare PPO | Source: Ambulatory Visit | Attending: Radiation Oncology | Admitting: Radiation Oncology

## 2016-05-21 ENCOUNTER — Ambulatory Visit: Admission: RE | Admit: 2016-05-21 | Payer: Medicare PPO | Source: Ambulatory Visit | Admitting: Radiation Oncology

## 2016-05-21 ENCOUNTER — Inpatient Hospital Stay
Admission: RE | Admit: 2016-05-21 | Discharge: 2016-05-21 | Disposition: A | Discharge status: Home / Self Care | Payer: Self-pay | Source: Ambulatory Visit | Attending: Radiation Oncology | Admitting: Radiation Oncology

## 2016-05-21 DIAGNOSIS — F40240 Claustrophobia: Secondary | ICD-10-CM | POA: Diagnosis not present

## 2016-05-21 DIAGNOSIS — Z7984 Long term (current) use of oral hypoglycemic drugs: Secondary | ICD-10-CM | POA: Diagnosis not present

## 2016-05-21 DIAGNOSIS — C50211 Malignant neoplasm of upper-inner quadrant of right female breast: Secondary | ICD-10-CM | POA: Diagnosis not present

## 2016-05-21 DIAGNOSIS — K219 Gastro-esophageal reflux disease without esophagitis: Secondary | ICD-10-CM | POA: Diagnosis not present

## 2016-05-21 DIAGNOSIS — M5136 Other intervertebral disc degeneration, lumbar region: Secondary | ICD-10-CM | POA: Diagnosis not present

## 2016-05-21 DIAGNOSIS — Z170 Estrogen receptor positive status [ER+]: Secondary | ICD-10-CM | POA: Diagnosis not present

## 2016-05-21 DIAGNOSIS — E119 Type 2 diabetes mellitus without complications: Secondary | ICD-10-CM | POA: Diagnosis not present

## 2016-05-21 DIAGNOSIS — F419 Anxiety disorder, unspecified: Secondary | ICD-10-CM | POA: Diagnosis not present

## 2016-05-21 DIAGNOSIS — F329 Major depressive disorder, single episode, unspecified: Secondary | ICD-10-CM | POA: Diagnosis not present

## 2016-05-21 MED ORDER — ALRA NON-METALLIC DEODORANT (RAD-ONC)
1.0000 "application " | Freq: Once | TOPICAL | Status: AC
  Administered 2016-05-21: 1 via TOPICAL

## 2016-05-21 MED ORDER — RADIAPLEXRX EX GEL
Freq: Once | CUTANEOUS | Status: AC
  Administered 2016-05-21: 15:00:00 via TOPICAL

## 2016-05-21 NOTE — Progress Notes (Signed)
Pt education done, radiaplex gel ,alra deodorant, my business card, radiation therapy and you book given, discussed skin irritation,pain,swelling/tenderness breast, luke warm shower/bath,pat dry, no rubbing,scrubbing or scratching rad tx area,  In crease  protein in diet, stay hydrated,drink plenty water, electric shaver only, no under witre bras, apply cream after radtx daily and bedtime, moisturizing and soothing only. teachjback given 3:01 PM

## 2016-05-22 ENCOUNTER — Ambulatory Visit
Admission: RE | Admit: 2016-05-22 | Discharge: 2016-05-22 | Disposition: A | Discharge status: Home / Self Care | Payer: Medicare PPO | Source: Ambulatory Visit | Attending: Radiation Oncology | Admitting: Radiation Oncology

## 2016-05-22 ENCOUNTER — Ambulatory Visit: Payer: Medicare PPO

## 2016-05-22 DIAGNOSIS — M5136 Other intervertebral disc degeneration, lumbar region: Secondary | ICD-10-CM | POA: Diagnosis not present

## 2016-05-22 DIAGNOSIS — K219 Gastro-esophageal reflux disease without esophagitis: Secondary | ICD-10-CM | POA: Diagnosis not present

## 2016-05-22 DIAGNOSIS — Z170 Estrogen receptor positive status [ER+]: Secondary | ICD-10-CM | POA: Diagnosis not present

## 2016-05-22 DIAGNOSIS — Z7984 Long term (current) use of oral hypoglycemic drugs: Secondary | ICD-10-CM | POA: Diagnosis not present

## 2016-05-22 DIAGNOSIS — F419 Anxiety disorder, unspecified: Secondary | ICD-10-CM | POA: Diagnosis not present

## 2016-05-22 DIAGNOSIS — F329 Major depressive disorder, single episode, unspecified: Secondary | ICD-10-CM | POA: Diagnosis not present

## 2016-05-22 DIAGNOSIS — F40240 Claustrophobia: Secondary | ICD-10-CM | POA: Diagnosis not present

## 2016-05-22 DIAGNOSIS — E119 Type 2 diabetes mellitus without complications: Secondary | ICD-10-CM | POA: Diagnosis not present

## 2016-05-22 DIAGNOSIS — C50211 Malignant neoplasm of upper-inner quadrant of right female breast: Secondary | ICD-10-CM | POA: Diagnosis not present

## 2016-05-23 ENCOUNTER — Ambulatory Visit
Admission: RE | Admit: 2016-05-23 | Discharge: 2016-05-23 | Disposition: A | Discharge status: Home / Self Care | Payer: Medicare PPO | Source: Ambulatory Visit | Attending: Radiation Oncology | Admitting: Radiation Oncology

## 2016-05-23 ENCOUNTER — Telehealth: Payer: Self-pay | Admitting: Oncology

## 2016-05-23 DIAGNOSIS — Z170 Estrogen receptor positive status [ER+]: Secondary | ICD-10-CM | POA: Diagnosis not present

## 2016-05-23 DIAGNOSIS — F329 Major depressive disorder, single episode, unspecified: Secondary | ICD-10-CM | POA: Diagnosis not present

## 2016-05-23 DIAGNOSIS — E119 Type 2 diabetes mellitus without complications: Secondary | ICD-10-CM | POA: Diagnosis not present

## 2016-05-23 DIAGNOSIS — M5136 Other intervertebral disc degeneration, lumbar region: Secondary | ICD-10-CM | POA: Diagnosis not present

## 2016-05-23 DIAGNOSIS — C50211 Malignant neoplasm of upper-inner quadrant of right female breast: Secondary | ICD-10-CM | POA: Diagnosis not present

## 2016-05-23 DIAGNOSIS — F40240 Claustrophobia: Secondary | ICD-10-CM | POA: Diagnosis not present

## 2016-05-23 DIAGNOSIS — K219 Gastro-esophageal reflux disease without esophagitis: Secondary | ICD-10-CM | POA: Diagnosis not present

## 2016-05-23 DIAGNOSIS — F419 Anxiety disorder, unspecified: Secondary | ICD-10-CM | POA: Diagnosis not present

## 2016-05-23 DIAGNOSIS — Z7984 Long term (current) use of oral hypoglycemic drugs: Secondary | ICD-10-CM | POA: Diagnosis not present

## 2016-05-23 NOTE — Telephone Encounter (Signed)
R/s genetics appt per patient request

## 2016-05-26 ENCOUNTER — Ambulatory Visit
Admission: RE | Admit: 2016-05-26 | Discharge: 2016-05-26 | Disposition: A | Discharge status: Home / Self Care | Payer: Medicare PPO | Source: Ambulatory Visit | Attending: Radiation Oncology | Admitting: Radiation Oncology

## 2016-05-26 DIAGNOSIS — M5136 Other intervertebral disc degeneration, lumbar region: Secondary | ICD-10-CM | POA: Diagnosis not present

## 2016-05-26 DIAGNOSIS — F419 Anxiety disorder, unspecified: Secondary | ICD-10-CM | POA: Diagnosis not present

## 2016-05-26 DIAGNOSIS — E119 Type 2 diabetes mellitus without complications: Secondary | ICD-10-CM | POA: Diagnosis not present

## 2016-05-26 DIAGNOSIS — Z7984 Long term (current) use of oral hypoglycemic drugs: Secondary | ICD-10-CM | POA: Diagnosis not present

## 2016-05-26 DIAGNOSIS — Z170 Estrogen receptor positive status [ER+]: Secondary | ICD-10-CM | POA: Diagnosis not present

## 2016-05-26 DIAGNOSIS — C50211 Malignant neoplasm of upper-inner quadrant of right female breast: Secondary | ICD-10-CM | POA: Diagnosis not present

## 2016-05-26 DIAGNOSIS — F329 Major depressive disorder, single episode, unspecified: Secondary | ICD-10-CM | POA: Diagnosis not present

## 2016-05-26 DIAGNOSIS — K219 Gastro-esophageal reflux disease without esophagitis: Secondary | ICD-10-CM | POA: Diagnosis not present

## 2016-05-26 DIAGNOSIS — F40240 Claustrophobia: Secondary | ICD-10-CM | POA: Diagnosis not present

## 2016-05-27 ENCOUNTER — Ambulatory Visit
Admission: RE | Admit: 2016-05-27 | Discharge: 2016-05-27 | Disposition: A | Discharge status: Home / Self Care | Payer: Medicare PPO | Source: Ambulatory Visit | Attending: Radiation Oncology | Admitting: Radiation Oncology

## 2016-05-27 DIAGNOSIS — F40240 Claustrophobia: Secondary | ICD-10-CM | POA: Diagnosis not present

## 2016-05-27 DIAGNOSIS — F329 Major depressive disorder, single episode, unspecified: Secondary | ICD-10-CM | POA: Diagnosis not present

## 2016-05-27 DIAGNOSIS — C50211 Malignant neoplasm of upper-inner quadrant of right female breast: Secondary | ICD-10-CM | POA: Diagnosis not present

## 2016-05-27 DIAGNOSIS — Z7984 Long term (current) use of oral hypoglycemic drugs: Secondary | ICD-10-CM | POA: Diagnosis not present

## 2016-05-27 DIAGNOSIS — Z170 Estrogen receptor positive status [ER+]: Secondary | ICD-10-CM | POA: Diagnosis not present

## 2016-05-27 DIAGNOSIS — K219 Gastro-esophageal reflux disease without esophagitis: Secondary | ICD-10-CM | POA: Diagnosis not present

## 2016-05-27 DIAGNOSIS — M5136 Other intervertebral disc degeneration, lumbar region: Secondary | ICD-10-CM | POA: Diagnosis not present

## 2016-05-27 DIAGNOSIS — F419 Anxiety disorder, unspecified: Secondary | ICD-10-CM | POA: Diagnosis not present

## 2016-05-27 DIAGNOSIS — E119 Type 2 diabetes mellitus without complications: Secondary | ICD-10-CM | POA: Diagnosis not present

## 2016-05-28 ENCOUNTER — Ambulatory Visit
Admission: RE | Admit: 2016-05-28 | Discharge: 2016-05-28 | Disposition: A | Discharge status: Home / Self Care | Payer: Medicare PPO | Source: Ambulatory Visit | Attending: Radiation Oncology | Admitting: Radiation Oncology

## 2016-05-28 DIAGNOSIS — Z170 Estrogen receptor positive status [ER+]: Secondary | ICD-10-CM | POA: Diagnosis not present

## 2016-05-28 DIAGNOSIS — F419 Anxiety disorder, unspecified: Secondary | ICD-10-CM | POA: Diagnosis not present

## 2016-05-28 DIAGNOSIS — F329 Major depressive disorder, single episode, unspecified: Secondary | ICD-10-CM | POA: Diagnosis not present

## 2016-05-28 DIAGNOSIS — M5136 Other intervertebral disc degeneration, lumbar region: Secondary | ICD-10-CM | POA: Diagnosis not present

## 2016-05-28 DIAGNOSIS — K219 Gastro-esophageal reflux disease without esophagitis: Secondary | ICD-10-CM | POA: Diagnosis not present

## 2016-05-28 DIAGNOSIS — C50211 Malignant neoplasm of upper-inner quadrant of right female breast: Secondary | ICD-10-CM | POA: Diagnosis not present

## 2016-05-28 DIAGNOSIS — Z7984 Long term (current) use of oral hypoglycemic drugs: Secondary | ICD-10-CM | POA: Diagnosis not present

## 2016-05-28 DIAGNOSIS — E119 Type 2 diabetes mellitus without complications: Secondary | ICD-10-CM | POA: Diagnosis not present

## 2016-05-28 DIAGNOSIS — F40240 Claustrophobia: Secondary | ICD-10-CM | POA: Diagnosis not present

## 2016-05-29 ENCOUNTER — Ambulatory Visit
Admission: RE | Admit: 2016-05-29 | Discharge: 2016-05-29 | Disposition: A | Discharge status: Home / Self Care | Payer: Medicare PPO | Source: Ambulatory Visit | Attending: Radiation Oncology | Admitting: Radiation Oncology

## 2016-05-29 DIAGNOSIS — F419 Anxiety disorder, unspecified: Secondary | ICD-10-CM | POA: Diagnosis not present

## 2016-05-29 DIAGNOSIS — C50211 Malignant neoplasm of upper-inner quadrant of right female breast: Secondary | ICD-10-CM | POA: Diagnosis not present

## 2016-05-29 DIAGNOSIS — Z170 Estrogen receptor positive status [ER+]: Secondary | ICD-10-CM | POA: Diagnosis not present

## 2016-05-29 DIAGNOSIS — K219 Gastro-esophageal reflux disease without esophagitis: Secondary | ICD-10-CM | POA: Diagnosis not present

## 2016-05-29 DIAGNOSIS — Z7984 Long term (current) use of oral hypoglycemic drugs: Secondary | ICD-10-CM | POA: Diagnosis not present

## 2016-05-29 DIAGNOSIS — E119 Type 2 diabetes mellitus without complications: Secondary | ICD-10-CM | POA: Diagnosis not present

## 2016-05-29 DIAGNOSIS — F40240 Claustrophobia: Secondary | ICD-10-CM | POA: Diagnosis not present

## 2016-05-29 DIAGNOSIS — M5136 Other intervertebral disc degeneration, lumbar region: Secondary | ICD-10-CM | POA: Diagnosis not present

## 2016-05-29 DIAGNOSIS — F329 Major depressive disorder, single episode, unspecified: Secondary | ICD-10-CM | POA: Diagnosis not present

## 2016-05-30 ENCOUNTER — Ambulatory Visit
Admission: RE | Admit: 2016-05-30 | Discharge: 2016-05-30 | Disposition: A | Discharge status: Home / Self Care | Payer: Medicare PPO | Source: Ambulatory Visit | Attending: Radiation Oncology | Admitting: Radiation Oncology

## 2016-05-30 DIAGNOSIS — M5136 Other intervertebral disc degeneration, lumbar region: Secondary | ICD-10-CM | POA: Diagnosis not present

## 2016-05-30 DIAGNOSIS — E119 Type 2 diabetes mellitus without complications: Secondary | ICD-10-CM | POA: Diagnosis not present

## 2016-05-30 DIAGNOSIS — F40240 Claustrophobia: Secondary | ICD-10-CM | POA: Diagnosis not present

## 2016-05-30 DIAGNOSIS — K219 Gastro-esophageal reflux disease without esophagitis: Secondary | ICD-10-CM | POA: Diagnosis not present

## 2016-05-30 DIAGNOSIS — F329 Major depressive disorder, single episode, unspecified: Secondary | ICD-10-CM | POA: Diagnosis not present

## 2016-05-30 DIAGNOSIS — Z7984 Long term (current) use of oral hypoglycemic drugs: Secondary | ICD-10-CM | POA: Diagnosis not present

## 2016-05-30 DIAGNOSIS — C50211 Malignant neoplasm of upper-inner quadrant of right female breast: Secondary | ICD-10-CM | POA: Diagnosis not present

## 2016-05-30 DIAGNOSIS — Z170 Estrogen receptor positive status [ER+]: Secondary | ICD-10-CM | POA: Diagnosis not present

## 2016-05-30 DIAGNOSIS — F419 Anxiety disorder, unspecified: Secondary | ICD-10-CM | POA: Diagnosis not present

## 2016-06-03 ENCOUNTER — Ambulatory Visit
Admission: RE | Admit: 2016-06-03 | Discharge: 2016-06-03 | Disposition: A | Discharge status: Home / Self Care | Payer: Medicare PPO | Source: Ambulatory Visit | Attending: Radiation Oncology | Admitting: Radiation Oncology

## 2016-06-03 DIAGNOSIS — M5136 Other intervertebral disc degeneration, lumbar region: Secondary | ICD-10-CM | POA: Diagnosis not present

## 2016-06-03 DIAGNOSIS — Z7984 Long term (current) use of oral hypoglycemic drugs: Secondary | ICD-10-CM | POA: Diagnosis not present

## 2016-06-03 DIAGNOSIS — F419 Anxiety disorder, unspecified: Secondary | ICD-10-CM | POA: Diagnosis not present

## 2016-06-03 DIAGNOSIS — Z170 Estrogen receptor positive status [ER+]: Secondary | ICD-10-CM | POA: Diagnosis not present

## 2016-06-03 DIAGNOSIS — C50211 Malignant neoplasm of upper-inner quadrant of right female breast: Secondary | ICD-10-CM | POA: Diagnosis not present

## 2016-06-03 DIAGNOSIS — E119 Type 2 diabetes mellitus without complications: Secondary | ICD-10-CM | POA: Diagnosis not present

## 2016-06-03 DIAGNOSIS — F40240 Claustrophobia: Secondary | ICD-10-CM | POA: Diagnosis not present

## 2016-06-03 DIAGNOSIS — K219 Gastro-esophageal reflux disease without esophagitis: Secondary | ICD-10-CM | POA: Diagnosis not present

## 2016-06-03 DIAGNOSIS — F329 Major depressive disorder, single episode, unspecified: Secondary | ICD-10-CM | POA: Diagnosis not present

## 2016-06-04 ENCOUNTER — Ambulatory Visit
Admission: RE | Admit: 2016-06-04 | Discharge: 2016-06-04 | Disposition: A | Discharge status: Home / Self Care | Payer: Medicare PPO | Source: Ambulatory Visit | Attending: Radiation Oncology | Admitting: Radiation Oncology

## 2016-06-04 DIAGNOSIS — F329 Major depressive disorder, single episode, unspecified: Secondary | ICD-10-CM | POA: Diagnosis not present

## 2016-06-04 DIAGNOSIS — C50211 Malignant neoplasm of upper-inner quadrant of right female breast: Secondary | ICD-10-CM | POA: Diagnosis not present

## 2016-06-04 DIAGNOSIS — E119 Type 2 diabetes mellitus without complications: Secondary | ICD-10-CM | POA: Diagnosis not present

## 2016-06-04 DIAGNOSIS — K219 Gastro-esophageal reflux disease without esophagitis: Secondary | ICD-10-CM | POA: Diagnosis not present

## 2016-06-04 DIAGNOSIS — Z7984 Long term (current) use of oral hypoglycemic drugs: Secondary | ICD-10-CM | POA: Diagnosis not present

## 2016-06-04 DIAGNOSIS — Z170 Estrogen receptor positive status [ER+]: Secondary | ICD-10-CM | POA: Diagnosis not present

## 2016-06-04 DIAGNOSIS — M5136 Other intervertebral disc degeneration, lumbar region: Secondary | ICD-10-CM | POA: Diagnosis not present

## 2016-06-04 DIAGNOSIS — F419 Anxiety disorder, unspecified: Secondary | ICD-10-CM | POA: Diagnosis not present

## 2016-06-04 DIAGNOSIS — F40240 Claustrophobia: Secondary | ICD-10-CM | POA: Diagnosis not present

## 2016-06-05 ENCOUNTER — Ambulatory Visit
Admission: RE | Admit: 2016-06-05 | Discharge: 2016-06-05 | Disposition: A | Discharge status: Home / Self Care | Payer: Medicare PPO | Source: Ambulatory Visit | Attending: Radiation Oncology | Admitting: Radiation Oncology

## 2016-06-05 DIAGNOSIS — Z170 Estrogen receptor positive status [ER+]: Secondary | ICD-10-CM | POA: Diagnosis not present

## 2016-06-05 DIAGNOSIS — E119 Type 2 diabetes mellitus without complications: Secondary | ICD-10-CM | POA: Diagnosis not present

## 2016-06-05 DIAGNOSIS — C50211 Malignant neoplasm of upper-inner quadrant of right female breast: Secondary | ICD-10-CM | POA: Diagnosis not present

## 2016-06-05 DIAGNOSIS — K219 Gastro-esophageal reflux disease without esophagitis: Secondary | ICD-10-CM | POA: Diagnosis not present

## 2016-06-05 DIAGNOSIS — F419 Anxiety disorder, unspecified: Secondary | ICD-10-CM | POA: Diagnosis not present

## 2016-06-05 DIAGNOSIS — F40240 Claustrophobia: Secondary | ICD-10-CM | POA: Diagnosis not present

## 2016-06-05 DIAGNOSIS — Z7984 Long term (current) use of oral hypoglycemic drugs: Secondary | ICD-10-CM | POA: Diagnosis not present

## 2016-06-05 DIAGNOSIS — M5136 Other intervertebral disc degeneration, lumbar region: Secondary | ICD-10-CM | POA: Diagnosis not present

## 2016-06-05 DIAGNOSIS — F329 Major depressive disorder, single episode, unspecified: Secondary | ICD-10-CM | POA: Diagnosis not present

## 2016-06-06 ENCOUNTER — Ambulatory Visit
Admission: RE | Admit: 2016-06-06 | Discharge: 2016-06-06 | Disposition: A | Discharge status: Home / Self Care | Payer: Medicare PPO | Source: Ambulatory Visit | Attending: Radiation Oncology | Admitting: Radiation Oncology

## 2016-06-06 DIAGNOSIS — C50211 Malignant neoplasm of upper-inner quadrant of right female breast: Secondary | ICD-10-CM | POA: Diagnosis not present

## 2016-06-06 DIAGNOSIS — E119 Type 2 diabetes mellitus without complications: Secondary | ICD-10-CM | POA: Diagnosis not present

## 2016-06-06 DIAGNOSIS — F329 Major depressive disorder, single episode, unspecified: Secondary | ICD-10-CM | POA: Diagnosis not present

## 2016-06-06 DIAGNOSIS — F419 Anxiety disorder, unspecified: Secondary | ICD-10-CM | POA: Diagnosis not present

## 2016-06-06 DIAGNOSIS — K219 Gastro-esophageal reflux disease without esophagitis: Secondary | ICD-10-CM | POA: Diagnosis not present

## 2016-06-06 DIAGNOSIS — Z7984 Long term (current) use of oral hypoglycemic drugs: Secondary | ICD-10-CM | POA: Diagnosis not present

## 2016-06-06 DIAGNOSIS — F40240 Claustrophobia: Secondary | ICD-10-CM | POA: Diagnosis not present

## 2016-06-06 DIAGNOSIS — Z170 Estrogen receptor positive status [ER+]: Secondary | ICD-10-CM | POA: Diagnosis not present

## 2016-06-06 DIAGNOSIS — M5136 Other intervertebral disc degeneration, lumbar region: Secondary | ICD-10-CM | POA: Diagnosis not present

## 2016-06-09 ENCOUNTER — Ambulatory Visit: Payer: Medicare PPO | Admitting: Radiation Oncology

## 2016-06-09 ENCOUNTER — Ambulatory Visit
Admission: RE | Admit: 2016-06-09 | Discharge: 2016-06-09 | Disposition: A | Discharge status: Home / Self Care | Payer: Medicare PPO | Source: Ambulatory Visit | Attending: Radiation Oncology | Admitting: Radiation Oncology

## 2016-06-09 DIAGNOSIS — M5136 Other intervertebral disc degeneration, lumbar region: Secondary | ICD-10-CM | POA: Diagnosis not present

## 2016-06-09 DIAGNOSIS — C50211 Malignant neoplasm of upper-inner quadrant of right female breast: Secondary | ICD-10-CM | POA: Diagnosis not present

## 2016-06-09 DIAGNOSIS — E119 Type 2 diabetes mellitus without complications: Secondary | ICD-10-CM | POA: Diagnosis not present

## 2016-06-09 DIAGNOSIS — F40240 Claustrophobia: Secondary | ICD-10-CM | POA: Diagnosis not present

## 2016-06-09 DIAGNOSIS — K219 Gastro-esophageal reflux disease without esophagitis: Secondary | ICD-10-CM | POA: Diagnosis not present

## 2016-06-09 DIAGNOSIS — F329 Major depressive disorder, single episode, unspecified: Secondary | ICD-10-CM | POA: Diagnosis not present

## 2016-06-09 DIAGNOSIS — F419 Anxiety disorder, unspecified: Secondary | ICD-10-CM | POA: Diagnosis not present

## 2016-06-09 DIAGNOSIS — Z7984 Long term (current) use of oral hypoglycemic drugs: Secondary | ICD-10-CM | POA: Diagnosis not present

## 2016-06-09 DIAGNOSIS — Z170 Estrogen receptor positive status [ER+]: Secondary | ICD-10-CM | POA: Diagnosis not present

## 2016-06-09 NOTE — Addendum Note (Signed)
Addendum  created 06/09/16 1251 by Oleta Mouse, MD   Sign clinical note

## 2016-06-10 ENCOUNTER — Ambulatory Visit
Admission: RE | Admit: 2016-06-10 | Discharge: 2016-06-10 | Disposition: A | Discharge status: Home / Self Care | Payer: Medicare PPO | Source: Ambulatory Visit | Attending: Radiation Oncology | Admitting: Radiation Oncology

## 2016-06-10 DIAGNOSIS — K219 Gastro-esophageal reflux disease without esophagitis: Secondary | ICD-10-CM | POA: Diagnosis not present

## 2016-06-10 DIAGNOSIS — F40240 Claustrophobia: Secondary | ICD-10-CM | POA: Diagnosis not present

## 2016-06-10 DIAGNOSIS — F329 Major depressive disorder, single episode, unspecified: Secondary | ICD-10-CM | POA: Diagnosis not present

## 2016-06-10 DIAGNOSIS — C50211 Malignant neoplasm of upper-inner quadrant of right female breast: Secondary | ICD-10-CM | POA: Diagnosis not present

## 2016-06-10 DIAGNOSIS — F419 Anxiety disorder, unspecified: Secondary | ICD-10-CM | POA: Diagnosis not present

## 2016-06-10 DIAGNOSIS — Z170 Estrogen receptor positive status [ER+]: Secondary | ICD-10-CM | POA: Diagnosis not present

## 2016-06-10 DIAGNOSIS — Z7984 Long term (current) use of oral hypoglycemic drugs: Secondary | ICD-10-CM | POA: Diagnosis not present

## 2016-06-10 DIAGNOSIS — M5136 Other intervertebral disc degeneration, lumbar region: Secondary | ICD-10-CM | POA: Diagnosis not present

## 2016-06-10 DIAGNOSIS — E119 Type 2 diabetes mellitus without complications: Secondary | ICD-10-CM | POA: Diagnosis not present

## 2016-06-11 ENCOUNTER — Ambulatory Visit
Admission: RE | Admit: 2016-06-11 | Discharge: 2016-06-11 | Disposition: A | Discharge status: Home / Self Care | Payer: Medicare PPO | Source: Ambulatory Visit | Attending: Radiation Oncology | Admitting: Radiation Oncology

## 2016-06-11 DIAGNOSIS — K219 Gastro-esophageal reflux disease without esophagitis: Secondary | ICD-10-CM | POA: Diagnosis not present

## 2016-06-11 DIAGNOSIS — F40240 Claustrophobia: Secondary | ICD-10-CM | POA: Diagnosis not present

## 2016-06-11 DIAGNOSIS — C50211 Malignant neoplasm of upper-inner quadrant of right female breast: Secondary | ICD-10-CM | POA: Diagnosis not present

## 2016-06-11 DIAGNOSIS — F329 Major depressive disorder, single episode, unspecified: Secondary | ICD-10-CM | POA: Diagnosis not present

## 2016-06-11 DIAGNOSIS — F419 Anxiety disorder, unspecified: Secondary | ICD-10-CM | POA: Diagnosis not present

## 2016-06-11 DIAGNOSIS — Z7984 Long term (current) use of oral hypoglycemic drugs: Secondary | ICD-10-CM | POA: Diagnosis not present

## 2016-06-11 DIAGNOSIS — M5136 Other intervertebral disc degeneration, lumbar region: Secondary | ICD-10-CM | POA: Diagnosis not present

## 2016-06-11 DIAGNOSIS — E119 Type 2 diabetes mellitus without complications: Secondary | ICD-10-CM | POA: Diagnosis not present

## 2016-06-11 DIAGNOSIS — Z170 Estrogen receptor positive status [ER+]: Secondary | ICD-10-CM | POA: Diagnosis not present

## 2016-06-12 ENCOUNTER — Ambulatory Visit
Admission: RE | Admit: 2016-06-12 | Discharge: 2016-06-12 | Disposition: A | Discharge status: Home / Self Care | Payer: Medicare PPO | Source: Ambulatory Visit | Attending: Radiation Oncology | Admitting: Radiation Oncology

## 2016-06-12 DIAGNOSIS — F419 Anxiety disorder, unspecified: Secondary | ICD-10-CM | POA: Diagnosis not present

## 2016-06-12 DIAGNOSIS — K219 Gastro-esophageal reflux disease without esophagitis: Secondary | ICD-10-CM | POA: Diagnosis not present

## 2016-06-12 DIAGNOSIS — Z7984 Long term (current) use of oral hypoglycemic drugs: Secondary | ICD-10-CM | POA: Diagnosis not present

## 2016-06-12 DIAGNOSIS — E119 Type 2 diabetes mellitus without complications: Secondary | ICD-10-CM | POA: Diagnosis not present

## 2016-06-12 DIAGNOSIS — M5136 Other intervertebral disc degeneration, lumbar region: Secondary | ICD-10-CM | POA: Diagnosis not present

## 2016-06-12 DIAGNOSIS — F40240 Claustrophobia: Secondary | ICD-10-CM | POA: Diagnosis not present

## 2016-06-12 DIAGNOSIS — Z170 Estrogen receptor positive status [ER+]: Secondary | ICD-10-CM | POA: Diagnosis not present

## 2016-06-12 DIAGNOSIS — F329 Major depressive disorder, single episode, unspecified: Secondary | ICD-10-CM | POA: Diagnosis not present

## 2016-06-12 DIAGNOSIS — C50211 Malignant neoplasm of upper-inner quadrant of right female breast: Secondary | ICD-10-CM | POA: Diagnosis not present

## 2016-06-13 ENCOUNTER — Ambulatory Visit: Payer: Medicare PPO

## 2016-06-16 ENCOUNTER — Ambulatory Visit
Admission: RE | Admit: 2016-06-16 | Discharge: 2016-06-16 | Disposition: A | Discharge status: Home / Self Care | Payer: Medicare PPO | Source: Ambulatory Visit | Attending: Radiation Oncology | Admitting: Radiation Oncology

## 2016-06-16 DIAGNOSIS — F329 Major depressive disorder, single episode, unspecified: Secondary | ICD-10-CM | POA: Diagnosis not present

## 2016-06-16 DIAGNOSIS — Z7984 Long term (current) use of oral hypoglycemic drugs: Secondary | ICD-10-CM | POA: Diagnosis not present

## 2016-06-16 DIAGNOSIS — Z170 Estrogen receptor positive status [ER+]: Secondary | ICD-10-CM | POA: Diagnosis not present

## 2016-06-16 DIAGNOSIS — E119 Type 2 diabetes mellitus without complications: Secondary | ICD-10-CM | POA: Diagnosis not present

## 2016-06-16 DIAGNOSIS — K219 Gastro-esophageal reflux disease without esophagitis: Secondary | ICD-10-CM | POA: Diagnosis not present

## 2016-06-16 DIAGNOSIS — Z803 Family history of malignant neoplasm of breast: Secondary | ICD-10-CM | POA: Diagnosis not present

## 2016-06-16 DIAGNOSIS — C50211 Malignant neoplasm of upper-inner quadrant of right female breast: Secondary | ICD-10-CM | POA: Diagnosis not present

## 2016-06-16 DIAGNOSIS — F419 Anxiety disorder, unspecified: Secondary | ICD-10-CM | POA: Diagnosis not present

## 2016-06-16 DIAGNOSIS — M5136 Other intervertebral disc degeneration, lumbar region: Secondary | ICD-10-CM | POA: Diagnosis not present

## 2016-06-16 DIAGNOSIS — Z8041 Family history of malignant neoplasm of ovary: Secondary | ICD-10-CM | POA: Diagnosis not present

## 2016-06-16 DIAGNOSIS — F40240 Claustrophobia: Secondary | ICD-10-CM | POA: Diagnosis not present

## 2016-06-17 ENCOUNTER — Ambulatory Visit
Admission: RE | Admit: 2016-06-17 | Discharge: 2016-06-17 | Disposition: A | Discharge status: Home / Self Care | Payer: Medicare PPO | Source: Ambulatory Visit | Attending: Radiation Oncology | Admitting: Radiation Oncology

## 2016-06-17 ENCOUNTER — Ambulatory Visit (HOSPITAL_BASED_OUTPATIENT_CLINIC_OR_DEPARTMENT_OTHER): Payer: Medicare PPO | Admitting: Genetics

## 2016-06-17 ENCOUNTER — Other Ambulatory Visit: Payer: Medicare PPO

## 2016-06-17 DIAGNOSIS — F40240 Claustrophobia: Secondary | ICD-10-CM | POA: Diagnosis not present

## 2016-06-17 DIAGNOSIS — M5136 Other intervertebral disc degeneration, lumbar region: Secondary | ICD-10-CM | POA: Diagnosis not present

## 2016-06-17 DIAGNOSIS — Z806 Family history of leukemia: Secondary | ICD-10-CM | POA: Diagnosis not present

## 2016-06-17 DIAGNOSIS — Z8041 Family history of malignant neoplasm of ovary: Secondary | ICD-10-CM

## 2016-06-17 DIAGNOSIS — F419 Anxiety disorder, unspecified: Secondary | ICD-10-CM | POA: Diagnosis not present

## 2016-06-17 DIAGNOSIS — Z315 Encounter for genetic counseling: Secondary | ICD-10-CM | POA: Diagnosis not present

## 2016-06-17 DIAGNOSIS — Z803 Family history of malignant neoplasm of breast: Secondary | ICD-10-CM | POA: Diagnosis not present

## 2016-06-17 DIAGNOSIS — F329 Major depressive disorder, single episode, unspecified: Secondary | ICD-10-CM | POA: Diagnosis not present

## 2016-06-17 DIAGNOSIS — Z7984 Long term (current) use of oral hypoglycemic drugs: Secondary | ICD-10-CM | POA: Diagnosis not present

## 2016-06-17 DIAGNOSIS — Z170 Estrogen receptor positive status [ER+]: Secondary | ICD-10-CM | POA: Diagnosis not present

## 2016-06-17 DIAGNOSIS — C50211 Malignant neoplasm of upper-inner quadrant of right female breast: Secondary | ICD-10-CM | POA: Diagnosis not present

## 2016-06-17 DIAGNOSIS — K219 Gastro-esophageal reflux disease without esophagitis: Secondary | ICD-10-CM | POA: Diagnosis not present

## 2016-06-17 DIAGNOSIS — E119 Type 2 diabetes mellitus without complications: Secondary | ICD-10-CM | POA: Diagnosis not present

## 2016-06-17 DIAGNOSIS — C50911 Malignant neoplasm of unspecified site of right female breast: Secondary | ICD-10-CM

## 2016-06-18 ENCOUNTER — Encounter: Payer: Self-pay | Admitting: Genetics

## 2016-06-18 ENCOUNTER — Ambulatory Visit
Admission: RE | Admit: 2016-06-18 | Discharge: 2016-06-18 | Disposition: A | Discharge status: Home / Self Care | Payer: Medicare PPO | Source: Ambulatory Visit | Attending: Radiation Oncology | Admitting: Radiation Oncology

## 2016-06-18 DIAGNOSIS — E119 Type 2 diabetes mellitus without complications: Secondary | ICD-10-CM | POA: Diagnosis not present

## 2016-06-18 DIAGNOSIS — Z170 Estrogen receptor positive status [ER+]: Secondary | ICD-10-CM | POA: Diagnosis not present

## 2016-06-18 DIAGNOSIS — F419 Anxiety disorder, unspecified: Secondary | ICD-10-CM | POA: Diagnosis not present

## 2016-06-18 DIAGNOSIS — K219 Gastro-esophageal reflux disease without esophagitis: Secondary | ICD-10-CM | POA: Diagnosis not present

## 2016-06-18 DIAGNOSIS — C50211 Malignant neoplasm of upper-inner quadrant of right female breast: Secondary | ICD-10-CM | POA: Diagnosis not present

## 2016-06-18 DIAGNOSIS — M5136 Other intervertebral disc degeneration, lumbar region: Secondary | ICD-10-CM | POA: Diagnosis not present

## 2016-06-18 DIAGNOSIS — Z7984 Long term (current) use of oral hypoglycemic drugs: Secondary | ICD-10-CM | POA: Diagnosis not present

## 2016-06-18 DIAGNOSIS — F329 Major depressive disorder, single episode, unspecified: Secondary | ICD-10-CM | POA: Diagnosis not present

## 2016-06-18 DIAGNOSIS — F40240 Claustrophobia: Secondary | ICD-10-CM | POA: Diagnosis not present

## 2016-06-18 NOTE — Progress Notes (Signed)
REFERRING PROVIDER: Chauncey Cruel, MD [address] [place], [place] [address]  PRIMARY PROVIDER:  [name], MD  PRIMARY REASON FOR VISIT:  1. Malignant neoplasm of right breast in female, estrogen receptor positive, unspecified site of breast ([place])   2. Family history of breast cancer   3. Family history of ovarian cancer   4. Family history of leukemia      HISTORY OF PRESENT ILLNESS:   Cynthia [name], a 71 y.o. female, was seen for a [facility] cancer genetics consultation at the request of Cynthia [name] due to a personal and family history of cancer.  Cynthia [name] presents to clinic today to discuss the possibility of a hereditary predisposition to cancer, genetic testing, and to further clarify her future cancer risks, as well as potential cancer risks for family members.   CANCER HISTORY: In March 2018, at the age of 49, Cynthia [name] was diagnosed with ER/PR+ HER2- invasive ductal carcinoma of the right breast. This was treated with lumpectomy and she is currently undergoing radiation. She also plans hormone therapy. She reports that she has had one colonoscopy, approximately 3 years ago, that was normal.  Past Medical History:  Diagnosis Date  . Anxiety   . Arthritis   . Cancer ([place])    breast cance  . DDD (degenerative disc disease), lumbar   . Depression   . Diabetes mellitus without complication ([place])    Takes metformin but has never been told she was diabetic  . GERD (gastroesophageal reflux disease)    OTC tums  . Hypertension   . Hypothyroidism   . IBS (irritable bowel syndrome)   . Insomnia   . Restless leg syndrome   . Thyroid disease     Past Surgical History:  Procedure Laterality Date  . BREAST LUMPECTOMY WITH RADIOACTIVE SEED AND SENTINEL LYMPH NODE BIOPSY Right 04/01/2016   Procedure: RADIOACTIVE SEED GUIDED RIGHT BREAST LUMPECTOMY WITH RIGHT AXILLARY SENTINEL LYMPH NODE BIOPSY.;  Surgeon: Cynthia Skates, MD;  Location: [facility];  Service: General;   Laterality: Right;  . CATARACT EXTRACTION    . CHOLECYSTECTOMY      Social History   Social History  . Marital status: Married    Spouse name: N/A  . Number of children: N/A  . Years of education: N/A   Social History Main Topics  . Smoking status: Never Smoker  . Smokeless tobacco: Never Used  . Alcohol use Yes     Comment: occasionally  . Drug use: No  . Sexual activity: Not on file   Other Topics Concern  . Not on file   Social History Narrative  . No narrative on file     FAMILY HISTORY:  We obtained a detailed, 4-generation family history.  Significant diagnoses are listed below: Family History  Problem Relation Age of Onset  . Breast cancer Sister 40       d.54  . Leukemia Brother 6  . Cervical cancer Sister 11  . Ovarian cancer Maternal Aunt 92       d.92s  . Breast cancer Other 45   Cynthia [name] has a [name], age 76, and son, age 66, without cancers. Cynthia [name] has two brothers and two sisters. One brother, Cynthia [name], is currently 37 and was diagnosed with leukemia at age 40. Cynthia [name] other brother, Cynthia [name], is 67 without cancers and was the stem cell donor for Cynthia [name]. Cynthia [name]'s sister, Cynthia [name], was diagnosed with breast cancer at age 28 and died from the disease  at age 59. Cynthia [name] reports that Cynthia [name] was aware of her breast lump "for a long time" but did not seek treatment. 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 Cynthia [name] other sister, Cynthia [name], is currently 37. In 1978, at the age of 47, Cynthia [name] was diagnosed with cervical cancer. Cynthia [name] reports that in 52, Cynthia [name] was diagnosed with ovarian cancer. 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 It is unclear which of Cynthia cancers were primaries vs. Metastases. Cynthia [name], Cynthia [name], is currently 57 and was diagnosed with breast cancer at age 66.  Cynthia [name]'s mother died at 19 without cancers. Cynthia [name] only maternal aunt died of  ovarian cancer in her early-90s. Cynthia [name] only maternal uncle died in his late-80s without cancers. Both of Cynthia [name]'s maternal grandparents died in their 63s from heart problems. Neither had cancer.  Cynthia [name]'s father died at 41 without cancers. Cynthia [name] had four paternal uncles who all died after age 79 without cancers. Both of Cynthia [name]'s paternal grandparents died after age 68 from heart problems. Neither had cancer.  Cynthia [name] is unaware of previous family history of genetic testing for hereditary cancer risks. Patient's maternal and paternal ancestors are of Caucasian descent. There is no reported Ashkenazi Jewish ancestry. There is no known consanguinity.  GENETIC COUNSELING ASSESSMENT: Cynthia [name] is a 71 y.o. female with a personal and family history which is somewhat suggestive of a hereditary cancer syndrome and predisposition to cancer. We, therefore, discussed and recommended the following at today's visit.   DISCUSSION: We reviewed the characteristics, features and inheritance patterns of hereditary cancer syndromes. We also discussed genetic testing, including the appropriate family members to test, the process of testing, insurance coverage and turn-around-time for results. We discussed the implications of a negative, positive and/or variant of uncertain significant result. We recommended Cynthia [name] pursue genetic testing for the Common Hereditary Cancers Panel offered by [facility]. [facility]'s Common Hereditary Cancers Panel includes analysis of the following 46 genes: APC, ATM, AXIN2, BARD1, BMPR1A, BRCA1, BRCA2, BRIP1, CDH1, CDKN2A, CHEK2, CTNNA1, DICER1, EPCAM, GREM1, HOXB13, KIT, MEN1, MLH1, MSH2, MSH3, MSH6, MUTYH, NBN, NF1, NTHL1, PALB2, PDGFRA, PMS2, POLD1, POLE, PTEN, RAD50, RAD51C, RAD51D, SDHA, SDHB, SDHC, SDHD, SMAD4, SMARCA4, STK11, TP53, TSC1, TSC2, and VHL.   Based on Cynthia [name]'s personal and family history of cancer, she meets medical criteria for genetic testing.  Despite that she meets criteria, she may still have an out of pocket cost. We discussed that if her out of pocket cost for testing is over $100, the laboratory will call and confirm whether she wants to proceed with testing.  If the out of pocket cost of testing is less than $100 she will be billed by the genetic testing laboratory.   PLAN: After considering the risks, benefits, and limitations, Ms. Dahmer  provided informed consent to pursue genetic testing and the blood sample was sent to [facility] for analysis of the 46-gene Common Hereditary Cancers Panel. Results should be available within approximately 3 weeks' time, at which point they will be disclosed by telephone to Ms. Sackmann, as will any additional recommendations warranted by these results. This information will also be available in Epic.   Based on Ms. Bamford's family history, it appears that other family members, such as her siblings and niece with breast cancer, are candidates for genetic testing. If her family members would like to learn more about hereditary  cancer risk and genetic testing options, they are encouraged to consult their physicians regarding genetic counseling and testing resources. Ms. Rummell will let us know if we can be of any assistance in coordinating genetic counseling and/or testing for this family member.   Lastly, we encouraged Ms. Amer to remain in contact with cancer genetics annually so that we can continuously update the family history and inform her of any changes in cancer genetics and testing that may be of benefit for this family.   Ms.  Goins questions were answered to her satisfaction today. Our contact information was provided should additional questions or concerns arise. Thank you for the referral and allowing [place] to share in the care of your patient.   Mal Misty, MS, [facility] Certified Naval [email or web address] phone: [phone]  The patient was seen for a total of  30 minutes in face-to-face genetic counseling.  This patient was discussed with Drs. Magrinat, Lindi Adie and/or Burr Medico who agrees with the above.    _______________________________________________________________________ For Office Staff:  Number of people involved in session: 1 Was an Intern/ student involved with case: no

## 2016-06-19 ENCOUNTER — Ambulatory Visit: Payer: Medicare PPO

## 2016-06-19 ENCOUNTER — Ambulatory Visit
Admission: RE | Admit: 2016-06-19 | Discharge: 2016-06-19 | Disposition: A | Discharge status: Home / Self Care | Payer: Medicare PPO | Source: Ambulatory Visit | Attending: Radiation Oncology | Admitting: Radiation Oncology

## 2016-06-19 DIAGNOSIS — E119 Type 2 diabetes mellitus without complications: Secondary | ICD-10-CM | POA: Diagnosis not present

## 2016-06-19 DIAGNOSIS — F40240 Claustrophobia: Secondary | ICD-10-CM | POA: Diagnosis not present

## 2016-06-19 DIAGNOSIS — Z170 Estrogen receptor positive status [ER+]: Secondary | ICD-10-CM | POA: Diagnosis not present

## 2016-06-19 DIAGNOSIS — K219 Gastro-esophageal reflux disease without esophagitis: Secondary | ICD-10-CM | POA: Diagnosis not present

## 2016-06-19 DIAGNOSIS — F419 Anxiety disorder, unspecified: Secondary | ICD-10-CM | POA: Diagnosis not present

## 2016-06-19 DIAGNOSIS — F329 Major depressive disorder, single episode, unspecified: Secondary | ICD-10-CM | POA: Diagnosis not present

## 2016-06-19 DIAGNOSIS — M5136 Other intervertebral disc degeneration, lumbar region: Secondary | ICD-10-CM | POA: Diagnosis not present

## 2016-06-19 DIAGNOSIS — C50211 Malignant neoplasm of upper-inner quadrant of right female breast: Secondary | ICD-10-CM | POA: Diagnosis not present

## 2016-06-19 DIAGNOSIS — Z7984 Long term (current) use of oral hypoglycemic drugs: Secondary | ICD-10-CM | POA: Diagnosis not present

## 2016-06-20 ENCOUNTER — Ambulatory Visit
Admission: RE | Admit: 2016-06-20 | Discharge: 2016-06-20 | Disposition: A | Discharge status: Home / Self Care | Payer: Medicare PPO | Source: Ambulatory Visit | Attending: Radiation Oncology | Admitting: Radiation Oncology

## 2016-06-20 ENCOUNTER — Encounter: Payer: Self-pay | Admitting: Radiation Oncology

## 2016-06-20 ENCOUNTER — Telehealth: Payer: Self-pay | Admitting: *Deleted

## 2016-06-20 DIAGNOSIS — C50211 Malignant neoplasm of upper-inner quadrant of right female breast: Secondary | ICD-10-CM | POA: Diagnosis not present

## 2016-06-20 DIAGNOSIS — Z7984 Long term (current) use of oral hypoglycemic drugs: Secondary | ICD-10-CM | POA: Diagnosis not present

## 2016-06-20 DIAGNOSIS — K219 Gastro-esophageal reflux disease without esophagitis: Secondary | ICD-10-CM | POA: Diagnosis not present

## 2016-06-20 DIAGNOSIS — Z170 Estrogen receptor positive status [ER+]: Secondary | ICD-10-CM | POA: Diagnosis not present

## 2016-06-20 DIAGNOSIS — F419 Anxiety disorder, unspecified: Secondary | ICD-10-CM | POA: Diagnosis not present

## 2016-06-20 DIAGNOSIS — M5136 Other intervertebral disc degeneration, lumbar region: Secondary | ICD-10-CM | POA: Diagnosis not present

## 2016-06-20 DIAGNOSIS — F40240 Claustrophobia: Secondary | ICD-10-CM | POA: Diagnosis not present

## 2016-06-20 DIAGNOSIS — F329 Major depressive disorder, single episode, unspecified: Secondary | ICD-10-CM | POA: Diagnosis not present

## 2016-06-20 DIAGNOSIS — E119 Type 2 diabetes mellitus without complications: Secondary | ICD-10-CM | POA: Diagnosis not present

## 2016-06-20 NOTE — Telephone Encounter (Signed)
Spoke to patient to follow up after XRT complete. She is doing well.  Reminded her of the appointment with Dr. [name] for 6/26.  She states she will be out of town that week.  Rescheduled her appointment for 07/15/16 at 930am.  Discussed navigation resources.

## 2016-06-25 ENCOUNTER — Ambulatory Visit: Payer: Self-pay | Admitting: Genetics

## 2016-06-25 ENCOUNTER — Telehealth: Payer: Self-pay | Admitting: Genetics

## 2016-06-25 ENCOUNTER — Encounter: Payer: Self-pay | Admitting: Genetics

## 2016-06-25 DIAGNOSIS — Z1379 Encounter for other screening for genetic and chromosomal anomalies: Secondary | ICD-10-CM

## 2016-06-25 HISTORY — DX: Encounter for other screening for genetic and chromosomal anomalies: Z13.79

## 2016-06-25 NOTE — Progress Notes (Signed)
HPI: Ms. Cadavid was previously seen in the [place] clinic on 06/17/2016 due to a personal and family history of cancer and concerns regarding a hereditary predisposition to cancer. Please refer to our prior cancer genetics clinic note for more information regarding Ms. Ahner's medical, social and family histories, and our assessment and recommendations, at the time. Ms. Winstead recent genetic test results were disclosed to her, as were recommendations warranted by these results. These results and recommendations are discussed in more detail below.  CANCER HISTORY: In March 2018, at the age of 71, Ms. 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 This was treated with lumpectomy and she is currently undergoing radiation. She also plans hormone therapy. She reports that she has had one colonoscopy, approximately 3 years ago, that was normal.   Malignant neoplasm of upper-inner quadrant of right breast in female, estrogen receptor positive ([place])   03/18/2016 Initial Diagnosis    Malignant neoplasm of upper-inner quadrant of right breast in female, estrogen receptor positive ([place])     06/23/2016 Genetic Testing    Genetic counseling and testing for hereditary cancer syndromes was performed on 06/17/2016. Results are negative for pathogenic mutations in 46 genes analyzed by [facility]'s Common Hereditary Cancers Panel. Results are dated 06/23/2016. Genes tested: APC, ATM, AXIN2, BARD1, BMPR1A, BRCA1, BRCA2, BRIP1, CDH1, CDKN2A, CHEK2, CTNNA1, DICER1, EPCAM, GREM1, HOXB13, KIT, MEN1, MLH1, MSH2, MSH3, MSH6, MUTYH, NBN, NF1, NTHL1, PALB2, PDGFRA, PMS2, POLD1, POLE, PTEN, RAD50, RAD51C, RAD51D, SDHA, SDHB, SDHC, SDHD, SMAD4, SMARCA4, STK11, TP53, TSC1, TSC2, and VHL.         FAMILY HISTORY:  We obtained a detailed, 4-generation family history.  Significant diagnoses are listed below: Family History  Problem Relation Age of Onset  . Breast cancer  Sister 64       d.54  . Leukemia Brother 18  . Cervical cancer Sister 51  . Ovarian cancer Maternal Aunt 92       d.92s  . Breast cancer Other 49   Ms. Shetterly has a daughter, age 54, and son, age 58, without cancers. Ms. Bottenfield has two brothers and two sisters. One brother, Rush Landmark, is currently 34 and was diagnosed with leukemia at age 19. Ms. Syler other brother, Mortimer Fries, is 28 without cancers and was the stem cell donor for Bill. Ms. Double's sister, Izora Gala, was diagnosed with breast cancer at age 45 and died from the disease at age 16. Ms. Licklider reports that Izora Gala was aware of her breast lump "for a long time" but did not seek treatment. 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 Ms. Ohmer other sister, Clarise Cruz, is currently 32. In 1978, at the age of 21, Clarise Cruz was diagnosed with cervical cancer. Ms. Aina reports that in 24, Clarise Cruz was diagnosed with ovarian cancer. In 1984, Clarise Cruz had vaginal and bladder cancer and in 1986, cancer metastasized to Sara's colon. It is unclear which of Sara's cancers were primaries vs. Metastases. Sara's daughter, Venida Jarvis, is currently 37 and was diagnosed with breast cancer at age 5.  Ms. Liberatore's mother died at 27 without cancers. Ms. Breed only maternal aunt died of ovarian cancer in her early-90s. Ms. Brickey only maternal uncle died in his late-80s without cancers. Both of Ms. Sumpter's maternal grandparents died in their 62s from heart problems. Neither had cancer.  Ms. Southwell's father died at 31 without cancers. Ms. Zaragosa had four paternal uncles who all died after age 14  without cancers. Both of Ms. Garfinkel's paternal grandparents died after age 70 from heart problems. Neither had cancer.  Ms. Talamantez is unaware of previous family history of genetic testing for hereditary cancer risks. Patient's maternal and paternal ancestors are of Caucasian descent. There is no reported Ashkenazi Jewish ancestry. There is no known  consanguinity.  GENETIC TEST RESULTS: Genetic testing performed through [facility]'s Common Hereditary Cancers Panel reported out on 06/23/2016 showed no pathogenic mutations. [facility] Common Hereditary Cancers Panel includes analysis of the following 46 genes: APC, ATM, AXIN2, BARD1, BMPR1A, BRCA1, BRCA2, BRIP1, CDH1, CDKN2A, CHEK2, CTNNA1, DICER1, EPCAM, GREM1, HOXB13, KIT, MEN1, MLH1, MSH2, MSH3, MSH6, MUTYH, NBN, NF1, NTHL1, PALB2, PDGFRA, PMS2, POLD1, POLE, PTEN, RAD50, RAD51C, RAD51D, SDHA, SDHB, SDHC, SDHD, SMAD4, SMARCA4, STK11, TP53, TSC1, TSC2, and VHL.  The test report will be scanned into EPIC and will be located under the Molecular Pathology section of the Results Review tab.A portion of the result report is included below for reference.    We discussed with Ms. Dreisbach that since the current genetic testing is not perfect, it is possible there may be a gene mutation in one of these genes that current testing cannot detect, but that chance is small. We also discussed, that it is possible that another gene that has not yet been discovered, or that we have not yet tested, is responsible for the cancer diagnoses in the family. Therefore, important to remain in touch with cancer genetics in the future so that we can continue to offer Ms. Hammersmith the most up to date genetic testing.   CANCER SCREENING RECOMMENDATIONS: This result indicates that it is unlikely Ms. Longbottom has an increased risk for a future cancer due to a mutation in one of these genes. Because there remains an unexplained clustering of cancer in Ms. Hoffert's family, especially among her siblings, the risk for cancer types observed in the family remains above-average. However, since genetic testing did not reveal any causative or actionable mutations, cancer screenings must be based on Ms. Sherrod's personal health history and observed family history of cancers. Therefore, it is recommended she continue to follow the cancer management  and screening guidelines provided by her oncology and primary healthcare providers.   RECOMMENDATIONS FOR FAMILY MEMBERS: Each of Ms. Kleist's family members should discuss their family history of cancers with their health care teams to establish personalized cancer screening plans.  Based on Ms. Davilla's family history, it appears that other family members, such as her siblings and niece with breast cancer, are candidates for genetic testing. If her family members would like to learn more about hereditary cancer risk and genetic testing options, they are encouraged to consult their physicians regarding genetic counseling and testing resources. Ms. Antuna will let us know if we can be of any assistance in coordinating genetic counseling and/or testing for this family member.   FOLLOW-UP: Lastly, we discussed with Ms. Maradiaga that cancer genetics is a rapidly advancing field and it is possible that new genetic tests will be appropriate for her and/or her family members in the future. We encouraged her to remain in contact with cancer genetics on an annual basis so we can update her personal and family histories and let her know of advances in cancer genetics that may benefit this family.   Our contact number was provided. Ms. Cerrone's questions were answered to her satisfaction, and she knows she is welcome to call us at anytime with additional questions or concerns.   Anna Villa, MS,   [facility] Certified Naval [email or web address]

## 2016-06-25 NOTE — Telephone Encounter (Signed)
Reviewed that germline genetic testing revealed no pathogenic mutations. This is considered to be a negative result. Testing was performed through [facility]'s 46-gene Common Hereditary Cancers Panel. [facility] Common Hereditary Cancers Panel includes analysis of the following 46 genes: APC, ATM, AXIN2, BARD1, BMPR1A, BRCA1, BRCA2, BRIP1, CDH1, CDKN2A, CHEK2, CTNNA1, DICER1, EPCAM, GREM1, HOXB13, KIT, MEN1, MLH1, MSH2, MSH3, MSH6, MUTYH, NBN, NF1, NTHL1, PALB2, PDGFRA, PMS2, POLD1, POLE, PTEN, RAD50, RAD51C, RAD51D, SDHA, SDHB, SDHC, SDHD, SMAD4, SMARCA4, STK11, TP53, TSC1, TSC2, and VHL.  For more detailed discussion, please see genetic counseling documentation from 06/25/2016. Result report dated 06/23/2016.

## 2016-07-01 ENCOUNTER — Ambulatory Visit: Payer: Medicare PPO | Admitting: Oncology

## 2016-07-01 NOTE — Progress Notes (Signed)
Radiation Oncology         [phone] °________________________________ ° °Name: Cynthia [name] MRN: [id number]  °Date: 06/20/2016  DOB: 09/30/1945 ° °End of Treatment Note ° °Diagnosis:   71 y.o. 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    ° °Indication for treatment:  Curative      ° °Radiation treatment dates:   05/22/2016 to 06/20/2016 ° °Site/dose:    °1. The Right breast was treated to 42.5 Gy in 17 fractions in 2.5 Gy per fraction. °2. The Right breast was boosted to 7.5 Gy in 3 fractions at 2.5 Gy per fraction.  ° °Beams/energy:    °1. Right breast tangent fields // 10X, 6X °2. En face Electron boost // 18E ° °Narrative: The patient tolerated radiation treatment relatively well.   She experienced fatigue with treatment. She did not develop any major skin changes.  ° °Plan: The patient has completed radiation treatment. The patient will return to radiation oncology clinic for routine followup in one month. I advised them to call or return sooner if they have any questions or concerns related to their recovery or treatment. ° °------------------------------------------------ ° °John S. Moody, MD, PhD ° °This document serves as a record of services personally performed by John Moody, MD. It was created on his behalf by Elizabeth Ashley, a trained medical scribe. The creation of this record is based on the scribe's personal observations and the provider's statements to them. This document has been checked and approved by the attending provider. ° °

## 2016-07-15 ENCOUNTER — Ambulatory Visit (HOSPITAL_BASED_OUTPATIENT_CLINIC_OR_DEPARTMENT_OTHER): Payer: Medicare PPO | Admitting: Oncology

## 2016-07-15 VITALS — BP 117/64 | HR 61 | Temp 97.8°F | Resp 18 | Ht 66.0 in | Wt 174.3 lb

## 2016-07-15 DIAGNOSIS — M545 Low back pain: Secondary | ICD-10-CM | POA: Diagnosis not present

## 2016-07-15 DIAGNOSIS — G8929 Other chronic pain: Secondary | ICD-10-CM | POA: Diagnosis not present

## 2016-07-15 DIAGNOSIS — N393 Stress incontinence (female) (male): Secondary | ICD-10-CM

## 2016-07-15 DIAGNOSIS — C50211 Malignant neoplasm of upper-inner quadrant of right female breast: Secondary | ICD-10-CM

## 2016-07-15 DIAGNOSIS — Z170 Estrogen receptor positive status [ER+]: Secondary | ICD-10-CM | POA: Diagnosis not present

## 2016-07-15 MED ORDER — ANASTROZOLE 1 MG PO TABS: 1.0000 mg | ORAL_TABLET | Freq: Every day | ORAL | 4 refills | Status: DC

## 2016-07-15 NOTE — Progress Notes (Signed)
[facility]  [phone] [phone]     ID: Cynthia [name] DOB: [birth year]  MR#: [id number]  QIO#:[id number]  Patient Care Team: Cynthia Small, MD as PCP - General (Family Medicine) Cynthia Skates, MD as Consulting Physician (General Surgery) Cynthia [name], Cynthia Dad, MD as Consulting Physician (Oncology) Cynthia Rudd, MD as Consulting Physician (Radiation Oncology) Cynthia Cruel, MD OTHER MD:  CHIEF COMPLAINT: Estrogen receptor positive breast cancer  CURRENT TREATMENT: Anastrozole   BREAST CANCER HISTORY: From the original intake note:  Cynthia [name] had bilateral screening mammography with tomosynthesis at [facility] 02/26/2016. There was a new area of asymmetry in the right breast at the 4:00 position. She was recalled for diagnostic mammography 03/07/2016. The breast density was category B. This found a new oval mass in the right breast upper inner quadrant with indistinct margins. Ultrasound of the right breast was performed the same day and confirmed a 0.6 cm oval mass in the right breast correlating with mammography findings. There was also a benign 3 mm cyst in the right breast upper inner quadrant middle depth  Biopsy of the right breast mass in question 03/10/2016 found ([id number]) invasive ductal carcinoma, grade 1, estrogen receptor 1 or percent positive, progesterone receptor 95% positive, both with strong staining intensity, with an MIB-1 of 10%, and no HER-2 implication, the signals ratio being 1.40 and the number per cell 2.10.  Her subsequent history is as detailed below.  INTERVAL HISTORY: Cynthia [name] returns today for follow-up and treatment of her estrogen receptor positive breast cance is her last visit here she underwent right lumpectomy with sentinel lymph node sampling. This showed ([id number], on 04/01/2016) invasive ductal carcinoma measuring 1.0 cm, grade 1, with all 3 sentinel lymph nodes clear (total for sampled). Margins were negative.  She  then proceeded to adjuvant radiation which was completed 06/20/2016. She generally did well with this, without significant fatigue or skin breakdown  Finally she had genetics testing which showed no evidence of a deleterious mutation  She is now ready to consider anti-estrogens.  Marland Kitchen REVIEW OF SYSTEMS: She still has some discomfort in the right breast, occasionally shooting pains, but nothing unexpected. She has a rare cough, nonproductive. She has heartburn. She has stress urinary incontinence. She bruises easily. She has low back pain. This is chronic and not more intense or persistent than before. She is somewhat anxious, but not depressed. A detailed review of systems today was otherwise noncontributory  PAST MEDICAL HISTORY: Past Medical History:  Diagnosis Date  . Anxiety   . Arthritis   . Cancer ([place])    breast cance  . DDD (degenerative disc disease), lumbar   . Depression   . Diabetes mellitus without complication ([place])    Takes metformin but has never been told she was diabetic  . Genetic testing 06/25/2016   Cynthia [name] underwent genetic counseling and testing for hereditary cancer syndromes on 06/17/2016. Her results were negative for mutations in all 46 genes analyzed by [facility]'s 46-gene Common Hereditary Cancers Panel. Genes analyzed include: APC, ATM, AXIN2, BARD1, BMPR1A, BRCA1, BRCA2, BRIP1, CDH1, CDKN2A, CHEK2, CTNNA1, DICER1, EPCAM, GREM1, HOXB13, KIT, MEN1, MLH1, MSH2, MSH3, MSH6, MUTYH, NBN,  . GERD (gastroesophageal reflux disease)    OTC tums  . Hypertension   . Hypothyroidism   . IBS (irritable bowel syndrome)   . Insomnia   . Restless leg syndrome   . Thyroid disease     PAST SURGICAL HISTORY: Past Surgical History:  Procedure Laterality Date  . BREAST LUMPECTOMY  WITH RADIOACTIVE SEED AND SENTINEL LYMPH NODE BIOPSY Right 04/01/2016   Procedure: RADIOACTIVE SEED GUIDED RIGHT BREAST LUMPECTOMY WITH RIGHT AXILLARY SENTINEL LYMPH NODE BIOPSY.;  Surgeon: Cynthia Skates, MD;  Location: [facility];  Service: General;  Laterality: Right;  . CATARACT EXTRACTION    . CHOLECYSTECTOMY      FAMILY HISTORY Family History  Problem Relation Age of Onset  . Breast cancer Sister 6       d.54  . Leukemia Brother 83  . Cervical cancer Sister 44  . Ovarian cancer Maternal Aunt 92       d.92s  . Breast cancer Other 52  Patient's father died at the age of 77 following a fall. He had a history of cerebral palsy. The patient's mother died from congestive heart failure at the age of 37. The patient had 2 sisters. One was diagnosed with colon cancer at age 74. She survives. The second one died from breast cancer diagnosed age 71. She neglected the disease and it may have been present before she was 15. One of the patient's brothers was diagnosed with leukemia at age 51. The patient also has one niece diagnosed with breast cancer in her early 34s  GYNECOLOGIC HISTORY:  No LMP recorded. Patient is postmenopausal.  Menarche age 71, first live birth age 89, the patient is [place] P2, she stopped having periods about 20 years ago, didn't take hormone replacement for many years, stopping approximately 2005.   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 Her husband Cynthia [name] (goes by "[name]") was in the [employer], worked in the police department until he was run over by car, then worked for the [employer] on [employer] side, but is now retired. They have been married 42 years as of 2018. Cynthia [name] has a daughter, Cynthia [name], from an earlier marriage. 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 Lien and [name] share a son, Cynthia [name], who lives in [place], and works for [employer]. The patient has 2 grandchildren. She attends a [employer].    ADVANCED DIRECTIVES: In place   HEALTH MAINTENANCE: Social History  Substance Use Topics  . Smoking status: Never Smoker  . Smokeless tobacco: Never Used  . Alcohol use  Yes     Comment: occasionally     Colonoscopy:2012, in [place]  PAP: 2012   Bone density: 2015: Normal   Allergies  Allergen Reactions  . No Known Allergies     Current Outpatient Prescriptions  Medication Sig Dispense Refill  . acetaminophen (TYLENOL) 500 MG tablet Take 500 mg by mouth every 6 (six) hours as needed (for headaches.).    Marland Kitchen ALPRAZolam (XANAX) 0.5 MG tablet Take 0.5 mg by mouth at bedtime.    Marland Kitchen atenolol (TENORMIN) 25 MG tablet Take 25 mg by mouth 2 (two) times daily.    . Choline Fenofibrate (FENOFIBRIC ACID) 135 MG CPDR Take 135 mg by mouth daily.    Marland Kitchen FERROUS SULFATE PO Take 1 tablet by mouth every evening.    . hyaluronate sodium (RADIAPLEXRX) GEL Apply 1 application topically 2 (two) times daily.    Marland Kitchen imipramine (TOFRANIL) 50 MG tablet Take 50 mg by mouth at bedtime.    Marland Kitchen levothyroxine (SYNTHROID, LEVOTHROID) 112 MCG tablet Take 112 mcg by mouth at bedtime.    . metFORMIN (GLUCOPHAGE) 500 MG tablet Take 500 mg by mouth 2 (two) times daily.    . non-metallic  deodorant Thornton Papas) MISC Apply 1 application topically daily as needed.    . psyllium (REGULOID) 0.52 g capsule Take 2.08 g by mouth daily at 2 PM.    . traMADol (ULTRAM) 50 MG tablet Take 50 mg by mouth every 6 (six) hours as needed (for back pain.).    Marland Kitchen traZODone (DESYREL) 150 MG tablet Take 150 mg by mouth at bedtime.    . triamterene-hydrochlorothiazide (MAXZIDE-25) 37.5-25 MG tablet Take 1 tablet by mouth daily.     No current facility-administered medications for this visit.     OBJECTIVE: Middle-aged white woman In no acute distress  Vitals:   07/15/16 1005  BP: 117/64  Pulse: 61  Resp: 18  Temp: 97.8 F (36.6 C)     Body mass index is 28.13 kg/m.    ECOG FS:0 - Asymptomatic  Sclerae unicteric, EOMs intact Oropharynx clear and moist No cervical or supraclavicular adenopathy Lungs no rales or rhonchi Heart regular rate and rhythm Abd soft, nontender, positive bowel sounds MSK  no focal spinal tenderness, no upper extremity lymphedema Neuro: nonfocal, well oriented, appropriate affect Breasts: The right breast is status post lumpectomy and radiation. The cosmetic result is excellent. There is minimal residual erythema. There is no evidence of residual or recurrent disease. The left breast is unremarkable. Both axillae are benign.   LAB RESULTS:  CMP     Component Value Date/Time   NA 139 03/28/2016 0901   NA 141 03/19/2016 1216   K 3.4 (L) 03/28/2016 0901   K 3.5 03/19/2016 1216   CL 100 (L) 03/28/2016 0901   CO2 28 03/28/2016 0901   CO2 29 03/19/2016 1216   GLUCOSE 98 03/28/2016 0901   GLUCOSE 109 03/19/2016 1216   BUN 15 03/28/2016 0901   BUN 19.2 03/19/2016 1216   CREATININE 0.99 03/28/2016 0901   CREATININE 1.4 (H) 03/19/2016 1216   CALCIUM 9.7 03/28/2016 0901   CALCIUM 9.9 03/19/2016 1216   PROT 7.2 03/19/2016 1216   ALBUMIN 3.7 03/19/2016 1216   AST 15 03/19/2016 1216   ALT 10 03/19/2016 1216   ALKPHOS 45 03/19/2016 1216   BILITOT 0.42 03/19/2016 1216   GFRNONAA 55 (L) 03/28/2016 0901   GFRAA >60 03/28/2016 0901    INo results found for: SPEP, UPEP  Lab Results  Component Value Date   WBC 9.5 03/28/2016   NEUTROABS 4.4 03/19/2016   HGB 14.7 03/28/2016   HCT 44.8 03/28/2016   MCV 92.0 03/28/2016   PLT 380 03/28/2016      Chemistry      Component Value Date/Time   NA 139 03/28/2016 0901   NA 141 03/19/2016 1216   K 3.4 (L) 03/28/2016 0901   K 3.5 03/19/2016 1216   CL 100 (L) 03/28/2016 0901   CO2 28 03/28/2016 0901   CO2 29 03/19/2016 1216   BUN 15 03/28/2016 0901   BUN 19.2 03/19/2016 1216   CREATININE 0.99 03/28/2016 0901   CREATININE 1.4 (H) 03/19/2016 1216      Component Value Date/Time   CALCIUM 9.7 03/28/2016 0901   CALCIUM 9.9 03/19/2016 1216   ALKPHOS 45 03/19/2016 1216   AST 15 03/19/2016 1216   ALT 10 03/19/2016 1216   BILITOT 0.42 03/19/2016 1216       No results found for: LABCA2  No components found  for: LABCA125  No results for input(s): INR in the last 168 hours.  Urinalysis No results found for: COLORURINE, APPEARANCEUR, LABSPEC, PHURINE, GLUCOSEU, HGBUR, BILIRUBINUR, KETONESUR, PROTEINUR, UROBILINOGEN,  NITRITE, LEUKOCYTESUR   STUDIES: No results found.  ELIGIBLE FOR AVAILABLE RESEARCH PROTOCOL: *no  ASSESSMENT: 71 y.o. [place] woman status post right breast upper inner quadrant biopsy 03/10/2016 for a clinical T1b N0, stage IA invasive ductal carcinoma, grade 1, estrogen and progesterone receptor positive, HER-2 nonamplified, with an MIB-1 of 10%  (1)Status post right lumpectomy and sentinel lymph node sampling 04/01/2016 for a pT1b pN0, stage IA invasive ductal carcinoma, grade 1, with negative margins  (2) given the patient's excellent anatomic prognosis and age, no Oncotype and no chemotherapy is planned  (3) adjuvant radiation 05/22/2016 to 06/20/2016 1. The Right breast was treated to 42.5 Gy in 17 fractions in 2.5 Gy per fraction. 2. The Right breast was boosted to 7.5 Gy in 3 fractions at 2.5 Gy per fraction.   (4) anastrozole started 07/15/2016  (5) Genetics testing 06/23/2016 through [facility]'s Common Hereditary Cancers Panel found no deleterious mutations in APC, ATM, AXIN2, BARD1, BMPR1A, BRCA1, BRCA2, BRIP1, CDH1, CDKN2A, CHEK2, CTNNA1, DICER1, EPCAM, GREM1, HOXB13, KIT, MEN1, MLH1, MSH2, MSH3, MSH6, MUTYH, NBN, NF1, NTHL1, PALB2, PDGFRA, PMS2, POLD1, POLE, PTEN, RAD50, RAD51C, RAD51D, SDHA, SDHB, SDHC, SDHD, SMAD4, SMARCA4, STK11, TP53, TSC1, TSC2, and VHL.    PLAN: Cynthia [name] has completed her local treatment and is now ready to start anti-estrogens.  We discussed the difference between tamoxifen and anastrozole in detail. She understands that anastrozole and the aromatase inhibitors in general work by blocking estrogen production. Accordingly vaginal dryness, decrease in bone density, and of course hot flashes can result. The aromatase inhibitors can also  negatively affect the cholesterol profile, although that is a minor effect. One out of 5 women on aromatase inhibitors we will feel "old and achy". This arthralgia/myalgia syndrome, which resembles fibromyalgia clinically, does resolve with stopping the medications. Accordingly this is not a reason to not try an aromatase inhibitor but it is a frequent reason to stop it (in other words 20% of women will not be able to tolerate these medications).  Tamoxifen on the other hand does not block estrogen production. It does not "take away a woman's estrogen". It blocks the estrogen receptor in breast cells. Like anastrozole, it can also cause hot flashes. As opposed to anastrozole, tamoxifen has many estrogen-like effects. It is technically an estrogen receptor modulator. This means that in some tissues tamoxifen works like estrogen-- for example it helps strengthen the bones. It tends to improve the cholesterol profile. It can cause thickening of the endometrial lining, and even endometrial polyps or rarely cancer of the uterus.(The risk of uterine cancer due to tamoxifen is one additional cancer per thousand women year). It can cause vaginal wetness or stickiness. It can cause blood clots through this estrogen-like effect--the risk of blood clots with tamoxifen is exactly the same as with birth control pills or hormone replacement.  Neither of these agents causes mood changes or weight gain, despite the popular belief that they can have these side effects. We have data from studies comparing either of these drugs with placebo, and in those cases the control group had the same amount of weight gain and depression as the group that took the drug.  After much discussion we decided to start with anastrozole. If she tolerates it well the plan will be to continue it for a total of 5 years. Note that she had a normal bone density in 2015. We will obtain a repeat sometime later this year.  She will see me again in  approximately 3 months. She  will call with any problems that may develop before that visit.    Cynthia Cruel, MD   07/15/2016 10:07 AM Medical Oncology and Hematology [facility] [address] [place], [place] [address] Tel. [phone]    Fax. [phone]

## 2016-07-25 DIAGNOSIS — E039 Hypothyroidism, unspecified: Secondary | ICD-10-CM | POA: Diagnosis not present

## 2016-07-25 DIAGNOSIS — R7303 Prediabetes: Secondary | ICD-10-CM | POA: Diagnosis not present

## 2016-07-25 DIAGNOSIS — E785 Hyperlipidemia, unspecified: Secondary | ICD-10-CM | POA: Diagnosis not present

## 2016-07-25 DIAGNOSIS — Z0000 Encounter for general adult medical examination without abnormal findings: Secondary | ICD-10-CM | POA: Diagnosis not present

## 2016-07-25 DIAGNOSIS — F5101 Primary insomnia: Secondary | ICD-10-CM | POA: Diagnosis not present

## 2016-07-25 DIAGNOSIS — I10 Essential (primary) hypertension: Secondary | ICD-10-CM | POA: Diagnosis not present

## 2016-07-25 DIAGNOSIS — E559 Vitamin D deficiency, unspecified: Secondary | ICD-10-CM | POA: Diagnosis not present

## 2016-07-28 ENCOUNTER — Ambulatory Visit
Admission: RE | Admit: 2016-07-28 | Discharge: 2016-07-28 | Disposition: A | Discharge status: Home / Self Care | Payer: Medicare PPO | Source: Ambulatory Visit | Attending: Radiation Oncology | Admitting: Radiation Oncology

## 2016-07-28 ENCOUNTER — Encounter: Payer: Self-pay | Admitting: Radiation Oncology

## 2016-07-28 VITALS — BP 122/79 | HR 64 | Temp 97.7°F | Wt 171.1 lb

## 2016-07-28 DIAGNOSIS — C50211 Malignant neoplasm of upper-inner quadrant of right female breast: Secondary | ICD-10-CM | POA: Insufficient documentation

## 2016-07-28 DIAGNOSIS — Z79899 Other long term (current) drug therapy: Secondary | ICD-10-CM | POA: Diagnosis not present

## 2016-07-28 DIAGNOSIS — Z170 Estrogen receptor positive status [ER+]: Secondary | ICD-10-CM | POA: Insufficient documentation

## 2016-07-28 DIAGNOSIS — Z7984 Long term (current) use of oral hypoglycemic drugs: Secondary | ICD-10-CM | POA: Diagnosis not present

## 2016-07-28 HISTORY — DX: Personal history of irradiation: Z92.3

## 2016-07-28 NOTE — Progress Notes (Signed)
Radiation Oncology         [phone]) [phone] ________________________________  Name: Cynthia [name] MRN: [id number]  Date: 07/28/2016  DOB: 1945-05-09  Post Treatment Note  CC: [name], MD  Magrinat, [name], MD  Diagnosis:   71 y.o. 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     Interval Since Last Radiation:  5 weeks   05/22/2016 to 06/20/2016: 1. The Right breast was treated to 42.5 Gy in 17 fractions in 2.5 Gy per fraction. 2. The Right breast was boosted to 7.5 Gy in 3 fractions at 2.5 Gy per fraction.    Narrative:  The patient returns today for routine follow-up. During treatment she did very well with radiotherapy and did not have significant desquamation.                             On review of systems, the patient states she's doing well and reports she's not having any untoward side effects since starting Arimidex. She denies any chest wall or breast pain. No other complaints are noted.   ALLERGIES:  is allergic to no known allergies.  Meds: Current Outpatient Prescriptions  Medication Sig Dispense Refill  . acetaminophen (TYLENOL) 500 MG tablet Take 500 mg by mouth every 6 (six) hours as needed (for headaches.).    Marland Kitchen ALPRAZolam (XANAX) 0.5 MG tablet Take 0.5 mg by mouth at bedtime.    Marland Kitchen anastrozole (ARIMIDEX) 1 MG tablet Take 1 tablet (1 mg total) by mouth daily. 90 tablet 4  . atenolol (TENORMIN) 25 MG tablet Take 25 mg by mouth 2 (two) times daily.    . Choline Fenofibrate (FENOFIBRIC ACID) 135 MG CPDR Take 135 mg by mouth daily.    Marland Kitchen FERROUS SULFATE PO Take 1 tablet by mouth every evening.    Marland Kitchen imipramine (TOFRANIL) 50 MG tablet Take 50 mg by mouth at bedtime.    Marland Kitchen levothyroxine (SYNTHROID, LEVOTHROID) 112 MCG tablet Take 112 mcg by mouth at bedtime.    . metFORMIN (GLUCOPHAGE) 500 MG tablet Take 500 mg by mouth 2 (two) times daily.    . psyllium (REGULOID) 0.52 g capsule Take 2.08 g by mouth daily  at 2 PM.    . traMADol (ULTRAM) 50 MG tablet Take 50 mg by mouth every 6 (six) hours as needed (for back pain.).    Marland Kitchen traZODone (DESYREL) 150 MG tablet Take 150 mg by mouth at bedtime.    . triamterene-hydrochlorothiazide (MAXZIDE-25) 37.5-25 MG tablet Take 1 tablet by mouth daily.     No current facility-administered medications for this encounter.     Physical Findings:  vitals were not taken for this visit.  /10 In general this is a well appearing caucasian female in no acute distress. She's alert and oriented x4 and appropriate throughout the examination. Cardiopulmonary assessment is negative for acute distress and she exhibits normal effort. The right breast was examined and reveals no desquamation or hyperpigmentation. At about 10 o'clock she has two small, subcentimeter areas of keratotic change. There are also several seborrheic appearing lesions sloughing in the inframammary fold.   Lab Findings: Lab Results  Component Value Date   WBC 9.5 03/28/2016   HGB 14.7 03/28/2016   HCT 44.8 03/28/2016   MCV 92.0 03/28/2016   PLT 380 03/28/2016     Radiographic Findings: No results found.  Impression/Plan: 1. Stage IA, pT1bNxMx, grade 1, ER+/PR+/Her2 negative  invasive ductal carcinoma of the right breast.. The patient has been doing well since completion of radiotherapy. We discussed that we would be happy to continue to follow her as needed, but she will also continue to follow up with Dr. [name] for surveillance while taking Anastrozole in medical oncology. She was counseled on skin care as well as measures to avoid sun exposure to this area.  2. Survivorship. The patient will meet in survivorship clinic in September 2018. She was encouraged to attend this appointment. 3. Dermatologic findings. The skin changes in the inframammary fold are typical following radiation, but the lesions at 10 o'clock should be evaluated if they persist past one month from now. She states agreement and  will see dermatology at that point if persistent.    Carola Rhine, PAC

## 2016-09-23 ENCOUNTER — Telehealth: Payer: Self-pay

## 2016-09-23 NOTE — Telephone Encounter (Signed)
Patient confirmed that she would be here for her SCP visit on 09/25/16

## 2016-09-25 ENCOUNTER — Ambulatory Visit (HOSPITAL_BASED_OUTPATIENT_CLINIC_OR_DEPARTMENT_OTHER): Payer: Medicare PPO | Admitting: Adult Health

## 2016-09-25 ENCOUNTER — Telehealth: Payer: Self-pay | Admitting: Adult Health

## 2016-09-25 VITALS — BP 118/69 | HR 72 | Temp 98.2°F | Resp 20 | Ht 66.0 in | Wt 171.2 lb

## 2016-09-25 DIAGNOSIS — C50211 Malignant neoplasm of upper-inner quadrant of right female breast: Secondary | ICD-10-CM | POA: Diagnosis not present

## 2016-09-25 DIAGNOSIS — R197 Diarrhea, unspecified: Secondary | ICD-10-CM | POA: Diagnosis not present

## 2016-09-25 DIAGNOSIS — Z170 Estrogen receptor positive status [ER+]: Secondary | ICD-10-CM

## 2016-09-25 NOTE — Telephone Encounter (Signed)
Scheduled appt per 9/20 los - sent reminder letter in the mall f/u 04/2017

## 2016-09-25 NOTE — Progress Notes (Signed)
CLINIC:  Survivorship   REASON FOR VISIT:  Routine follow-up post-treatment for a recent history of breast cancer.  BRIEF ONCOLOGIC HISTORY:    Malignant neoplasm of upper-inner quadrant of right breast in female, estrogen receptor positive ([place])   03/10/2016 Initial Biopsy    right breast upper inner quadrant biopsy for a clinical T1b N0, stage IA invasive ductal carcinoma, grade 1, estrogen and progesterone receptor positive, HER-2 nonamplified, with an MIB-1 of 10%      03/18/2016 Initial Diagnosis    Malignant neoplasm of upper-inner quadrant of right breast in female, estrogen receptor positive ([place])     04/01/2016 Surgery    Right lumpectomy (ingram): IDC, grade 1, 1cm, DCIS, margins negative, 4LN negative, ER+(100%), PR+(95%), Ki-67 10%, HER-2 negative (ratio 1.40). T1b, N0      05/22/2016 - 06/20/2016 Radiation Therapy    Adjuvant Radiation [facility]): 1. The Right breast was treated to 42.5 Gy in 17 fractions in 2.5 Gy per fraction.  2. The Right breast was boosted to 7.5 Gy in 3 fractions at 2.5 Gy per fraction.      06/23/2016 Genetic Testing    Genetic counseling and testing for hereditary cancer syndromes was performed on 06/17/2016. Results are negative for pathogenic mutations in 46 genes analyzed by [facility]'s Common Hereditary Cancers Panel. Results are dated 06/23/2016. Genes tested: APC, ATM, AXIN2, BARD1, BMPR1A, BRCA1, BRCA2, BRIP1, CDH1, CDKN2A, CHEK2, CTNNA1, DICER1, EPCAM, GREM1, HOXB13, KIT, MEN1, MLH1, MSH2, MSH3, MSH6, MUTYH, NBN, NF1, NTHL1, PALB2, PDGFRA, PMS2, POLD1, POLE, PTEN, RAD50, RAD51C, RAD51D, SDHA, SDHB, SDHC, SDHD, SMAD4, SMARCA4, STK11, TP53, TSC1, TSC2, and VHL.       07/15/2016 -  Anti-estrogen oral therapy    Anastrozole daily       INTERVAL HISTORY:  Cynthia [name] presents to the [facility] today for our initial meeting to review her survivorship care plan detailing her treatment course for breast cancer, as well as monitoring long-term  side effects of that treatment, education regarding health maintenance, screening, and overall wellness and health promotion.     Overall, Cynthia [name] reports feeling quite well.  She is taking Anastrozole daily.  She does note a more recent increase in diarrhea.  She thinks it may be related to that.  She also has h/o IBS.  She has never been to GI and is considering going to GI for evaluation.  She is eating greek yogurt and taking fiber pills, though she doesn't do this religiously.  She had a bump on the top of her head that went away after a few days, then she developed them under her arms, and those have gone away. She denies any issues with the Anastrozole, and is without any further questions or concerns.      REVIEW OF SYSTEMS:  Review of Systems  Constitutional: Negative for appetite change, chills, fatigue, fever and unexpected weight change.  HENT:   Negative for hearing loss and lump/mass.   Eyes: Negative for eye problems and icterus.  Respiratory: Negative for chest tightness, cough and shortness of breath.   Cardiovascular: Negative for chest pain, leg swelling and palpitations.  Gastrointestinal: Positive for diarrhea (see HPI). Negative for abdominal distention, abdominal pain, constipation, nausea and vomiting.  Endocrine: Negative for hot flashes.  Genitourinary: Negative for difficulty urinating.   Musculoskeletal: Negative for arthralgias.  Skin: Negative for itching and rash.  Neurological: Negative for dizziness, extremity weakness, headaches and numbness.  Hematological: Negative for adenopathy. Does not bruise/bleed easily.  Psychiatric/Behavioral: Negative for  depression. The patient is not nervous/anxious.   Breast: Denies any new nodularity, masses, tenderness, nipple changes, or nipple discharge.      ONCOLOGY TREATMENT TEAM:  1. Surgeon:  Dr. Dalbert Batman at [facility] 2. Medical Oncologist: Dr. [name]  3. Radiation Oncologist: Dr. [name]    PAST  MEDICAL/SURGICAL HISTORY:  Past Medical History:  Diagnosis Date  . Anxiety   . Arthritis   . Cancer ([place])    breast cance  . DDD (degenerative disc disease), lumbar   . Depression   . Diabetes mellitus without complication ([place])    Takes metformin but has never been told she was diabetic  . Genetic testing 06/25/2016   Cynthia [name] underwent genetic counseling and testing for hereditary cancer syndromes on 06/17/2016. Her results were negative for mutations in all 46 genes analyzed by [facility]'s 46-gene Common Hereditary Cancers Panel. Genes analyzed include: APC, ATM, AXIN2, BARD1, BMPR1A, BRCA1, BRCA2, BRIP1, CDH1, CDKN2A, CHEK2, CTNNA1, DICER1, EPCAM, GREM1, HOXB13, KIT, MEN1, MLH1, MSH2, MSH3, MSH6, MUTYH, NBN,  . GERD (gastroesophageal reflux disease)    OTC tums  . History of radiation therapy 05/22/2016 to 06/20/2016  . Hypertension   . Hypothyroidism   . IBS (irritable bowel syndrome)   . Insomnia   . Restless leg syndrome   . Thyroid disease    Past Surgical History:  Procedure Laterality Date  . BREAST LUMPECTOMY WITH RADIOACTIVE SEED AND SENTINEL LYMPH NODE BIOPSY Right 04/01/2016   Procedure: RADIOACTIVE SEED GUIDED RIGHT BREAST LUMPECTOMY WITH RIGHT AXILLARY SENTINEL LYMPH NODE BIOPSY.;  Surgeon: Fanny Skates, MD;  Location: [facility];  Service: General;  Laterality: Right;  . CATARACT EXTRACTION    . CHOLECYSTECTOMY       ALLERGIES:  Allergies  Allergen Reactions  . No Known Allergies      CURRENT MEDICATIONS:  Outpatient Encounter Prescriptions as of 09/25/2016  Medication Sig Note  . acetaminophen (TYLENOL) 500 MG tablet Take 500 mg by mouth every 6 (six) hours as needed (for headaches.). 03/26/2016: Infrequently   . ALPRAZolam (XANAX) 0.5 MG tablet Take 0.5 mg by mouth at bedtime. 07/28/2016: Patient is taking 57m at night.  .Marland Kitchenanastrozole (ARIMIDEX) 1 MG tablet Take 1 tablet (1 mg total) by mouth daily.   .Marland Kitchenatenolol (TENORMIN) 25 MG tablet Take 25 mg by mouth 2  (two) times daily.   . Choline Fenofibrate (FENOFIBRIC ACID) 135 MG CPDR Take 135 mg by mouth daily.   .Marland KitchenFERROUS SULFATE PO Take 1 tablet by mouth every evening.   .Marland Kitchenimipramine (TOFRANIL) 50 MG tablet Take 50 mg by mouth at bedtime.   .Marland Kitchenlevothyroxine (SYNTHROID, LEVOTHROID) 112 MCG tablet Take 112 mcg by mouth at bedtime.   . metFORMIN (GLUCOPHAGE) 500 MG tablet Take 500 mg by mouth 2 (two) times daily.   . psyllium (REGULOID) 0.52 g capsule Take 2.08 g by mouth daily at 2 PM. 03/26/2016: Time varies  . traMADol (ULTRAM) 50 MG tablet Take 50 mg by mouth every 6 (six) hours as needed (for back pain.).   .Marland KitchentraZODone (DESYREL) 150 MG tablet Take 150 mg by mouth at bedtime.   . triamterene-hydrochlorothiazide (MAXZIDE-25) 37.5-25 MG tablet Take 1 tablet by mouth daily.    No facility-administered encounter medications on file as of 09/25/2016.      ONCOLOGIC FAMILY HISTORY:  Family History  Problem Relation Age of Onset  . Breast cancer Sister 516      d.54  . Leukemia Brother 669 . Cervical  cancer Sister 64  . Ovarian cancer Maternal Aunt 92       d.92s  . Breast cancer Other 45     GENETIC COUNSELING/TESTING: Negative, see above  SOCIAL HISTORY:  Alashia Ailey is married and lives with her husband in [place], [place].  She has 2 children and they live in [place] and [place], [place].  Ms. Karapetyan is currently retired.  She denies any current or history of tobacco, alcohol, or illicit drug use.     PHYSICAL EXAMINATION:  Vital Signs:   Vitals:   09/25/16 1420  BP: 118/69  Pulse: 72  Resp: 20  Temp: 98.2 F (36.8 C)  SpO2: 99%   Filed Weights   09/25/16 1420  Weight: 171 lb 3.2 oz (77.7 kg)   General: Well-nourished, well-appearing female in no acute distress.  She is unaccompanied today.   HEENT: Head is normocephalic.  Pupils equal and reactive to light. Conjunctivae clear without exudate.  Sclerae anicteric. Oral mucosa is pink, moist.  Oropharynx is pink  without lesions or erythema.  Lymph: No cervical, supraclavicular, or infraclavicular lymphadenopathy noted on palpation.  Cardiovascular: Regular rate and rhythm.Marland Kitchen Respiratory: Clear to auscultation bilaterally. Chest expansion symmetric; breathing non-labored.  GI: Abdomen soft and round; non-tender, non-distended. Bowel sounds normoactive.  GU: Deferred.  Neuro: No focal deficits. Steady gait.  Psych: Mood and affect normal and appropriate for situation.  Extremities: No edema. MSK: No focal spinal tenderness to palpation.  Full range of motion in bilateral upper extremities Skin: Warm and dry.  LABORATORY DATA:  None for this visit.  DIAGNOSTIC IMAGING:  None for this visit.      ASSESSMENT AND PLAN:  Ms.. [name] is a pleasant 71 y.o. female with Stage IA right breast invasive ductal carcinoma, ER+/PR+/HER2-, diagnosed in 03/2016, treated with lumpectomy, adjuvant radiation therapy, and anti-estrogen therapy with Anastrozole beginning in 07/2016.  She presents to the Survivorship Clinic for our initial meeting and routine follow-up post-completion of treatment for breast cancer.    1. Stage IA right breast cancer:  Ms. Bradeen is continuing to recover from definitive treatment for breast cancer. She will follow-up with her medical oncologist, Dr. [name] in 04/2017 with history and physical exam per surveillance protocol.  She will continue her anti-estrogen therapy with Anastrozole daily. Thus far, she is tolerating the Anastrozole well, with minimal side effects.  Today, a comprehensive survivorship care plan and treatment summary was reviewed with the patient today detailing her breast cancer diagnosis, treatment course, potential late/long-term effects of treatment, appropriate follow-up care with recommendations for the future, and patient education resources.  A copy of this summary, along with a letter will be sent to the patient's primary care provider via mail/fax/In Basket message  after today's visit.    2.  Diarrhea: This is likely secondary to her IBS.  We reviewed taking a probiotic and a low FODMAP diet.  She may go and see a gastroenterologist, which would be perfectly acceptable as well.    3. Bone health:  Given Ms. Scripter's age/history of breast cancer and her current treatment regimen including anti-estrogen therapy with Anastrozole, she is at risk for bone demineralization.  She is unsure when her last bone density test was done.  She says when she had it done, she was told that she has, "the bones of an 71 year old."  I did ask her to bring copies of her most recent bone density into our office so that we can look at them.  In the meantime, she was encouraged to increase her consumption of foods rich in calcium, as well as increase her weight-bearing activities.  She was given education on specific activities to promote bone health.  4. Cancer screening:  Due to Ms. Munch's history and her age, she should receive screening for skin cancers, colon cancer, and gynecologic cancers.  The information and recommendations are listed on the patient's comprehensive care plan/treatment summary and were reviewed in detail with the patient.    5. Health maintenance and wellness promotion: Ms. Huntley was encouraged to consume 5-7 servings of fruits and vegetables per day. We reviewed the "Nutrition Rainbow" handout, as well as the handout "Take Control of Your Health and Reduce Your Cancer Risk" from the [employer].  She was also encouraged to engage in moderate to vigorous exercise for 30 minutes per day most days of the week. We discussed the [employer] fitness program, which is designed for cancer survivors to help them become more physically fit after cancer treatments.  She was instructed to limit her alcohol consumption and continue to abstain from tobacco use.     6. Support services/counseling: It is not uncommon for this period of the patient's cancer care  trajectory to be one of many emotions and stressors.  We discussed an opportunity for her to participate in the next session of [facility] ("Finding Your New Normal") support group series designed for patients after they have completed treatment.   Ms. Bamburg was encouraged to take advantage of our many other support services programs, support groups, and/or counseling in coping with her new life as a cancer survivor after completing anti-cancer treatment.  She was offered support today through active listening and expressive supportive counseling.  She was given information regarding our available services and encouraged to contact me with any questions or for help enrolling in any of our support group/programs.    Dispo:   -Return to cancer center for follow up with Dr. [name] in 04/2017 -Mammogram due in 02/2017 -Follow up with Dr. Dalbert Batman at [facility] in 10/2017 -She is welcome to return back to the Survivorship Clinic at any time; no additional follow-up needed at this time.  -Consider referral back to survivorship as a long-term survivor for continued surveillance  A total of (30) minutes of face-to-face time was spent with this patient with greater than 50% of that time in counseling and care-coordination.   Gardenia Phlegm, [place] [phone]   Note: PRIMARY CARE PROVIDER [name], [place] [phone]

## 2016-09-26 ENCOUNTER — Encounter: Payer: Self-pay | Admitting: Adult Health

## 2016-11-04 DIAGNOSIS — K219 Gastro-esophageal reflux disease without esophagitis: Secondary | ICD-10-CM | POA: Diagnosis not present

## 2016-11-04 DIAGNOSIS — R198 Other specified symptoms and signs involving the digestive system and abdomen: Secondary | ICD-10-CM | POA: Diagnosis not present

## 2016-11-13 ENCOUNTER — Encounter: Payer: Self-pay | Admitting: General Surgery

## 2016-11-13 DIAGNOSIS — C50311 Malignant neoplasm of lower-inner quadrant of right female breast: Secondary | ICD-10-CM | POA: Diagnosis not present

## 2016-11-13 DIAGNOSIS — M5136 Other intervertebral disc degeneration, lumbar region: Secondary | ICD-10-CM | POA: Diagnosis not present

## 2016-11-13 DIAGNOSIS — I10 Essential (primary) hypertension: Secondary | ICD-10-CM | POA: Diagnosis not present

## 2016-11-13 DIAGNOSIS — E039 Hypothyroidism, unspecified: Secondary | ICD-10-CM | POA: Diagnosis not present

## 2017-01-13 DIAGNOSIS — E039 Hypothyroidism, unspecified: Secondary | ICD-10-CM | POA: Diagnosis not present

## 2017-01-13 DIAGNOSIS — E785 Hyperlipidemia, unspecified: Secondary | ICD-10-CM | POA: Diagnosis not present

## 2017-01-13 DIAGNOSIS — E559 Vitamin D deficiency, unspecified: Secondary | ICD-10-CM | POA: Diagnosis not present

## 2017-01-13 DIAGNOSIS — I10 Essential (primary) hypertension: Secondary | ICD-10-CM | POA: Diagnosis not present

## 2017-01-13 DIAGNOSIS — R7303 Prediabetes: Secondary | ICD-10-CM | POA: Diagnosis not present

## 2017-01-21 DIAGNOSIS — G4700 Insomnia, unspecified: Secondary | ICD-10-CM | POA: Diagnosis not present

## 2017-03-22 ENCOUNTER — Other Ambulatory Visit: Payer: Self-pay | Admitting: Oncology

## 2017-03-24 ENCOUNTER — Telehealth: Payer: Self-pay | Admitting: Oncology

## 2017-03-24 NOTE — Telephone Encounter (Signed)
Called regarding 7/2

## 2017-04-02 ENCOUNTER — Encounter: Payer: Self-pay | Admitting: Oncology

## 2017-04-02 DIAGNOSIS — Z853 Personal history of malignant neoplasm of breast: Secondary | ICD-10-CM | POA: Diagnosis not present

## 2017-04-02 DIAGNOSIS — R928 Other abnormal and inconclusive findings on diagnostic imaging of breast: Secondary | ICD-10-CM | POA: Diagnosis not present

## 2017-04-23 ENCOUNTER — Other Ambulatory Visit: Payer: Medicare PPO

## 2017-04-23 ENCOUNTER — Ambulatory Visit: Payer: Medicare PPO | Admitting: Oncology

## 2017-07-06 ENCOUNTER — Other Ambulatory Visit: Payer: Self-pay

## 2017-07-06 DIAGNOSIS — C50211 Malignant neoplasm of upper-inner quadrant of right female breast: Secondary | ICD-10-CM

## 2017-07-06 DIAGNOSIS — Z170 Estrogen receptor positive status [ER+]: Principal | ICD-10-CM

## 2017-07-06 NOTE — Progress Notes (Signed)
[facility]  [phone] [phone]     ID: Cynthia [name] DOB: [birth year]  MR#: [id number]  HRC#:[id number]  Patient Care Team: [name], MD as PCP - General (Family Medicine) Fanny Skates, MD as Consulting Physician (General Surgery) Serina Nichter, [name], MD as Consulting Physician (Oncology) Kyung Rudd, MD as Consulting Physician (Radiation Oncology) Delice Bison Charlestine Massed, NP as Nurse Practitioner (Hematology and Oncology) Shawnie Dapper OTHER MD:  CHIEF COMPLAINT: Estrogen receptor positive breast cancer  CURRENT TREATMENT: Anastrozole   BREAST CANCER HISTORY: From the original intake note:  Cynthia [name] had bilateral screening mammography with tomosynthesis at [facility] 02/26/2016. There was a new area of asymmetry in the right breast at the 4:00 position. She was recalled for diagnostic mammography 03/07/2016. The breast density was category B. This found a new oval mass in the right breast upper inner quadrant with indistinct margins. Ultrasound of the right breast was performed the same day and confirmed a 0.6 cm oval mass in the right breast correlating with mammography findings. There was also a benign 3 mm cyst in the right breast upper inner quadrant middle depth  Biopsy of the right breast mass in question 03/10/2016 found ([id number]) invasive ductal carcinoma, grade 1, estrogen receptor 1 or percent positive, progesterone receptor 95% positive, both with strong staining intensity, with an MIB-1 of 10%, and no HER-2 implication, the signals ratio being 1.40 and the number per cell 2.10.  Her subsequent history is as detailed below.  INTERVAL HISTORY: Cynthia [name] returns today for follow-up and treatment of her estrogen receptor positive breast cancer accompanied by her husband. She continues on anastrozole, with good tolerance.  She has mild hot flashes but no other symptoms of concern.  Since her last visit, she underwent diagnostic bilateral  mammography with CAD and tomography on 04/02/2017 at [facility] showing: breast density category B. There was no evidence of malignancy.   REVIEW OF SYSTEMS: Cynthia [name] is enjoying retirement, with some travel, a lot of exercise (she managed to lose 20 pounds with minimal changes in her diet) and generally feeling normal and well.  She does have some degenerative disc disease which keeps her from walking long distances.  A detailed review of systems today was otherwise stable   PAST MEDICAL HISTORY: Past Medical History:  Diagnosis Date  . Anxiety   . Arthritis   . Cancer ([place])    breast cance  . DDD (degenerative disc disease), lumbar   . Depression   . Diabetes mellitus without complication ([place])    Takes metformin but has never been told she was diabetic  . Genetic testing 06/25/2016   Ms. Vanlue underwent genetic counseling and testing for hereditary cancer syndromes on 06/17/2016. Her results were negative for mutations in all 46 genes analyzed by [facility]'s 46-gene Common Hereditary Cancers Panel. Genes analyzed include: APC, ATM, AXIN2, BARD1, BMPR1A, BRCA1, BRCA2, BRIP1, CDH1, CDKN2A, CHEK2, CTNNA1, DICER1, EPCAM, GREM1, HOXB13, KIT, MEN1, MLH1, MSH2, MSH3, MSH6, MUTYH, NBN,  . GERD (gastroesophageal reflux disease)    OTC tums  . History of radiation therapy 05/22/2016 to 06/20/2016  . Hypertension   . Hypothyroidism   . IBS (irritable bowel syndrome)   . Insomnia   . Restless leg syndrome   . Thyroid disease     PAST SURGICAL HISTORY: Past Surgical History:  Procedure Laterality Date  . BREAST LUMPECTOMY WITH RADIOACTIVE SEED AND SENTINEL LYMPH NODE BIOPSY Right 04/01/2016   Procedure: RADIOACTIVE SEED GUIDED RIGHT BREAST LUMPECTOMY WITH RIGHT AXILLARY SENTINEL LYMPH  NODE BIOPSY.;  Surgeon: Fanny Skates, MD;  Location: [facility];  Service: General;  Laterality: Right;  . CATARACT EXTRACTION    . CHOLECYSTECTOMY      FAMILY HISTORY Family History  Problem Relation Age of Onset  .  Breast cancer Sister 11       d.54  . Leukemia Brother 35  . Cervical cancer Sister 88  . Ovarian cancer Maternal Aunt 92       d.92s  . Breast cancer Other 77  Patient's father died at the age of 62 following a fall. He had a history of cerebral palsy. The patient's mother died from congestive heart failure at the age of 53. The patient had 2 sisters. One was diagnosed with colon cancer at age 54. She survives. The second one died from breast cancer diagnosed age 35. She neglected the disease and it may have been present before she was 77. One of the patient's brothers was diagnosed with leukemia at age 60. The patient also has one niece diagnosed with breast cancer in her early 68s  GYNECOLOGIC HISTORY:  No LMP recorded. Patient is postmenopausal.  Menarche age 33, first live birth age 84, the patient is [place] P2, she stopped having periods about 20 years ago, didn't take hormone replacement for many years, stopping approximately 2005.   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 Her husband Cynthia [name] (goes by "Cynthia [name]") was in [employer], worked in the police department until he was run over by car, then worked for the [employer] on [employer] side, but is now retired. They have been married 42 years as of 2018. Cynthia [name] has a daughter, Cynthia [name], from an earlier marriage. 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 Cynthia [name] and Cynthia [name] share a son, Cynthia [name], who lives in [place], and works for [employer]. The patient has 2 grandchildren. She attends a [employer].    ADVANCED DIRECTIVES: In place   HEALTH MAINTENANCE: Social History   Tobacco Use  . Smoking status: Never Smoker  . Smokeless tobacco: Never Used  Substance Use Topics  . Alcohol use: Yes    Comment: occasionally  . Drug use: No     Colonoscopy:2012, in [place]  PAP: 2012   Bone density: 2015: Normal   Allergies  Allergen Reactions    . No Known Allergies     Current Outpatient Medications  Medication Sig Dispense Refill  . acetaminophen (TYLENOL) 500 MG tablet Take 500 mg by mouth every 6 (six) hours as needed (for headaches.).    Marland Kitchen ALPRAZolam (XANAX) 0.5 MG tablet Take 0.5 mg by mouth at bedtime.    Marland Kitchen anastrozole (ARIMIDEX) 1 MG tablet Take 1 tablet (1 mg total) by mouth daily. 90 tablet 4  . atenolol (TENORMIN) 25 MG tablet Take 25 mg by mouth 2 (two) times daily.    . Choline Fenofibrate (FENOFIBRIC ACID) 135 MG CPDR Take 135 mg by mouth daily.    Marland Kitchen FERROUS SULFATE PO Take 1 tablet by mouth every evening.    Marland Kitchen imipramine (TOFRANIL) 50 MG tablet Take 50 mg by mouth at bedtime.    Marland Kitchen levothyroxine (SYNTHROID, LEVOTHROID) 112 MCG tablet Take 112 mcg by mouth at bedtime.    . metFORMIN (GLUCOPHAGE) 500 MG tablet Take 500 mg by mouth 2 (two) times daily.    . psyllium (REGULOID) 0.52 g capsule Take 2.08 g by mouth daily at 2 PM.    .  traZODone (DESYREL) 150 MG tablet Take 150 mg by mouth at bedtime.    . triamterene-hydrochlorothiazide (MAXZIDE-25) 37.5-25 MG tablet Take 1 tablet by mouth daily.     No current facility-administered medications for this visit.     OBJECTIVE: Middle-aged white woman who appears well  Vitals:   07/07/17 1351  BP: 111/64  Pulse: (!) 45  Resp: 17  Temp: 98.4 F (36.9 C)  SpO2: 99%     Body mass index is 27.44 kg/m.    ECOG FS:0 - Asymptomatic  Sclerae unicteric, pupils round and equal Oropharynx clear and moist No cervical or supraclavicular adenopathy Lungs no rales or rhonchi Heart regular rate and rhythm Abd soft, nontender, positive bowel sounds MSK no focal spinal tenderness, no upper extremity lymphedema Neuro: nonfocal, well oriented, appropriate affect Breasts: The right breast has undergone lumpectomy followed by radiation with no evidence of local recurrence.  Left breast is benign.  Both axillae are benign.  LAB RESULTS:  CMP     Component Value Date/Time    NA 141 07/07/2017 1333   NA 141 03/19/2016 1216   K 3.6 07/07/2017 1333   K 3.5 03/19/2016 1216   CL 104 07/07/2017 1333   CO2 30 07/07/2017 1333   CO2 29 03/19/2016 1216   GLUCOSE 115 (H) 07/07/2017 1333   GLUCOSE 109 03/19/2016 1216   BUN 17 07/07/2017 1333   BUN 19.2 03/19/2016 1216   CREATININE 0.89 07/07/2017 1333   CREATININE 1.4 (H) 03/19/2016 1216   CALCIUM 9.6 07/07/2017 1333   CALCIUM 9.9 03/19/2016 1216   PROT 6.9 07/07/2017 1333   PROT 7.2 03/19/2016 1216   ALBUMIN 3.6 07/07/2017 1333   ALBUMIN 3.7 03/19/2016 1216   AST 16 07/07/2017 1333   AST 15 03/19/2016 1216   ALT 11 07/07/2017 1333   ALT 10 03/19/2016 1216   ALKPHOS 40 07/07/2017 1333   ALKPHOS 45 03/19/2016 1216   BILITOT 0.4 07/07/2017 1333   BILITOT 0.42 03/19/2016 1216   GFRNONAA >60 07/07/2017 1333   GFRAA >60 07/07/2017 1333    INo results found for: SPEP, UPEP  Lab Results  Component Value Date   WBC 6.9 07/07/2017   NEUTROABS 4.0 07/07/2017   HGB 13.3 07/07/2017   HCT 40.9 07/07/2017   MCV 94.0 07/07/2017   PLT 325 07/07/2017      Chemistry      Component Value Date/Time   NA 141 07/07/2017 1333   NA 141 03/19/2016 1216   K 3.6 07/07/2017 1333   K 3.5 03/19/2016 1216   CL 104 07/07/2017 1333   CO2 30 07/07/2017 1333   CO2 29 03/19/2016 1216   BUN 17 07/07/2017 1333   BUN 19.2 03/19/2016 1216   CREATININE 0.89 07/07/2017 1333   CREATININE 1.4 (H) 03/19/2016 1216      Component Value Date/Time   CALCIUM 9.6 07/07/2017 1333   CALCIUM 9.9 03/19/2016 1216   ALKPHOS 40 07/07/2017 1333   ALKPHOS 45 03/19/2016 1216   AST 16 07/07/2017 1333   AST 15 03/19/2016 1216   ALT 11 07/07/2017 1333   ALT 10 03/19/2016 1216   BILITOT 0.4 07/07/2017 1333   BILITOT 0.42 03/19/2016 1216       No results found for: LABCA2  No components found for: LABCA125  No results for input(s): INR in the last 168 hours.  Urinalysis No results found for: COLORURINE, APPEARANCEUR, LABSPEC, PHURINE,  GLUCOSEU, HGBUR, BILIRUBINUR, KETONESUR, PROTEINUR, UROBILINOGEN, NITRITE, LEUKOCYTESUR   STUDIES: Since her  last visit, she underwent diagnostic bilateral mammography with CAD and tomography on 04/02/2017 at [facility] showing: breast density category B. There was no evidence of malignancy.    ELIGIBLE FOR AVAILABLE RESEARCH PROTOCOL: *no  ASSESSMENT: 72 y.o. [place] woman status post right breast upper inner quadrant biopsy 03/10/2016 for a clinical T1b N0, stage IA invasive ductal carcinoma, grade 1, estrogen and progesterone receptor positive, HER-2 nonamplified, with an MIB-1 of 10%  (1)Status post right lumpectomy and sentinel lymph node sampling 04/01/2016 for a pT1b pN0, stage IA invasive ductal carcinoma, grade 1, with negative margins  (2) given the patient's excellent anatomic prognosis and age, no Oncotype and no chemotherapy is planned  (3) adjuvant radiation 05/22/2016 to 06/20/2016 1. The Right breast was treated to 42.5 Gy in 17 fractions in 2.5 Gy per fraction. 2. The Right breast was boosted to 7.5 Gy in 3 fractions at 2.5 Gy per fraction.   (4) anastrozole started 07/15/2016  (5) Genetics testing 06/23/2016 through [facility]'s Common Hereditary Cancers Panel found no deleterious mutations in APC, ATM, AXIN2, BARD1, BMPR1A, BRCA1, BRCA2, BRIP1, CDH1, CDKN2A, CHEK2, CTNNA1, DICER1, EPCAM, GREM1, HOXB13, KIT, MEN1, MLH1, MSH2, MSH3, MSH6, MUTYH, NBN, NF1, NTHL1, PALB2, PDGFRA, PMS2, POLD1, POLE, PTEN, RAD50, RAD51C, RAD51D, SDHA, SDHB, SDHC, SDHD, SMAD4, SMARCA4, STK11, TP53, TSC1, TSC2, and VHL.    PLAN: Kasyn is now a little over a year out from definitive surgery for breast cancer with no evidence of disease recurrence.  This is favorable.  She is tolerating anastrozole well and the plan will be to continue that for a total of 5 years.  I am going to set her up for a bone density scan at [facility] March 2020.  I commended her on her excellent exercise program.  She will  see me again in 1 year.  She knows to call for any problems that may develop before that visit.    Arcangel Minion, [name], MD  07/07/17 2:29 PM Medical Oncology and Hematology [facility] [address] [place], [place] [address] Tel. [phone]    Fax. [phone]  Alice Rieger, am acting as scribe for Chauncey Cruel MD.  I, Lurline Del MD, have reviewed the above documentation for accuracy and completeness, and I agree with the above.

## 2017-07-07 ENCOUNTER — Telehealth: Payer: Self-pay | Admitting: Oncology

## 2017-07-07 ENCOUNTER — Inpatient Hospital Stay: Payer: Medicare PPO | Attending: Oncology

## 2017-07-07 ENCOUNTER — Inpatient Hospital Stay: Payer: Medicare PPO | Admitting: Oncology

## 2017-07-07 VITALS — BP 111/64 | HR 45 | Temp 98.4°F | Resp 17 | Ht 66.0 in | Wt 170.0 lb

## 2017-07-07 DIAGNOSIS — E039 Hypothyroidism, unspecified: Secondary | ICD-10-CM | POA: Diagnosis not present

## 2017-07-07 DIAGNOSIS — K589 Irritable bowel syndrome without diarrhea: Secondary | ICD-10-CM

## 2017-07-07 DIAGNOSIS — Z7984 Long term (current) use of oral hypoglycemic drugs: Secondary | ICD-10-CM | POA: Diagnosis not present

## 2017-07-07 DIAGNOSIS — E119 Type 2 diabetes mellitus without complications: Secondary | ICD-10-CM

## 2017-07-07 DIAGNOSIS — I10 Essential (primary) hypertension: Secondary | ICD-10-CM | POA: Diagnosis not present

## 2017-07-07 DIAGNOSIS — G4700 Insomnia, unspecified: Secondary | ICD-10-CM | POA: Insufficient documentation

## 2017-07-07 DIAGNOSIS — M5136 Other intervertebral disc degeneration, lumbar region: Secondary | ICD-10-CM

## 2017-07-07 DIAGNOSIS — G2581 Restless legs syndrome: Secondary | ICD-10-CM | POA: Diagnosis not present

## 2017-07-07 DIAGNOSIS — R232 Flushing: Secondary | ICD-10-CM

## 2017-07-07 DIAGNOSIS — Z803 Family history of malignant neoplasm of breast: Secondary | ICD-10-CM

## 2017-07-07 DIAGNOSIS — Z79811 Long term (current) use of aromatase inhibitors: Secondary | ICD-10-CM

## 2017-07-07 DIAGNOSIS — Z170 Estrogen receptor positive status [ER+]: Secondary | ICD-10-CM | POA: Diagnosis not present

## 2017-07-07 DIAGNOSIS — Z79899 Other long term (current) drug therapy: Secondary | ICD-10-CM | POA: Insufficient documentation

## 2017-07-07 DIAGNOSIS — F418 Other specified anxiety disorders: Secondary | ICD-10-CM | POA: Diagnosis not present

## 2017-07-07 DIAGNOSIS — Z806 Family history of leukemia: Secondary | ICD-10-CM | POA: Insufficient documentation

## 2017-07-07 DIAGNOSIS — M129 Arthropathy, unspecified: Secondary | ICD-10-CM | POA: Diagnosis not present

## 2017-07-07 DIAGNOSIS — C50211 Malignant neoplasm of upper-inner quadrant of right female breast: Secondary | ICD-10-CM

## 2017-07-07 DIAGNOSIS — K219 Gastro-esophageal reflux disease without esophagitis: Secondary | ICD-10-CM

## 2017-07-07 DIAGNOSIS — Z8041 Family history of malignant neoplasm of ovary: Secondary | ICD-10-CM

## 2017-07-07 DIAGNOSIS — Z923 Personal history of irradiation: Secondary | ICD-10-CM | POA: Diagnosis not present

## 2017-07-07 LAB — CMP (CANCER CENTER ONLY)
ALBUMIN: 3.6 g/dL (ref 3.5–5.0)
ALT: 11 U/L (ref 0–44)
ANION GAP: 7 (ref 5–15)
AST: 16 U/L (ref 15–41)
Alkaline Phosphatase: 40 U/L (ref 38–126)
BUN: 17 mg/dL (ref 8–23)
CHLORIDE: 104 mmol/L (ref 98–111)
CO2: 30 mmol/L (ref 22–32)
Calcium: 9.6 mg/dL (ref 8.9–10.3)
Creatinine: 0.89 mg/dL (ref 0.44–1.00)
GFR, Est AFR Am: >60 mL/min (ref >60)
GFR, Estimated: >60 mL/min (ref >60)
GLUCOSE: 115 mg/dL — AB (ref 70–99)
POTASSIUM: 3.6 mmol/L (ref 3.5–5.1)
SODIUM: 141 mmol/L (ref 135–145)
TOTAL PROTEIN: 6.9 g/dL (ref 6.5–8.1)
Total Bilirubin: 0.4 mg/dL (ref 0.3–1.2)

## 2017-07-07 LAB — CBC WITH DIFFERENTIAL (CANCER CENTER ONLY)
BASOS ABS: 0 10*3/uL (ref 0.0–0.1)
BASOS PCT: 1 %
EOS ABS: 0.2 10*3/uL (ref 0.0–0.5)
EOS PCT: 2 %
HEMATOCRIT: 40.9 % (ref 34.8–46.6)
Hemoglobin: 13.3 g/dL (ref 11.6–15.9)
Lymphocytes Relative: 35 %
Lymphs Abs: 2.4 10*3/uL (ref 0.9–3.3)
MCH: 30.6 pg (ref 25.1–34.0)
MCHC: 32.5 g/dL (ref 31.5–36.0)
MCV: 94 fL (ref 79.5–101.0)
MONO ABS: 0.3 10*3/uL (ref 0.1–0.9)
MONOS PCT: 5 %
NEUTROS ABS: 4 10*3/uL (ref 1.5–6.5)
Neutrophils Relative %: 57 %
PLATELETS: 325 10*3/uL (ref 145–400)
RBC: 4.35 MIL/uL (ref 3.70–5.45)
RDW: 13.7 % (ref 11.2–14.5)
WBC Count: 6.9 10*3/uL (ref 3.9–10.3)

## 2017-07-07 MED ORDER — ANASTROZOLE 1 MG PO TABS: 1.0000 mg | ORAL_TABLET | Freq: Every day | ORAL | 4 refills | Status: DC

## 2017-07-07 NOTE — Telephone Encounter (Signed)
Gave patient avs and calendar of upcoming April appts.

## 2017-07-08 ENCOUNTER — Telehealth: Payer: Self-pay | Admitting: Oncology

## 2017-07-08 NOTE — Telephone Encounter (Signed)
Per 7/2 sch msg, faxed order to [facility] for DEXA.

## 2017-07-29 ENCOUNTER — Encounter: Payer: Self-pay | Admitting: Oncology

## 2017-07-29 DIAGNOSIS — Z780 Asymptomatic menopausal state: Secondary | ICD-10-CM | POA: Diagnosis not present

## 2017-11-10 DIAGNOSIS — E785 Hyperlipidemia, unspecified: Secondary | ICD-10-CM | POA: Diagnosis not present

## 2017-11-10 DIAGNOSIS — I10 Essential (primary) hypertension: Secondary | ICD-10-CM | POA: Diagnosis not present

## 2017-11-10 DIAGNOSIS — Z23 Encounter for immunization: Secondary | ICD-10-CM | POA: Diagnosis not present

## 2017-11-10 DIAGNOSIS — E559 Vitamin D deficiency, unspecified: Secondary | ICD-10-CM | POA: Diagnosis not present

## 2017-11-10 DIAGNOSIS — R7303 Prediabetes: Secondary | ICD-10-CM | POA: Diagnosis not present

## 2017-11-10 DIAGNOSIS — Z1211 Encounter for screening for malignant neoplasm of colon: Secondary | ICD-10-CM | POA: Diagnosis not present

## 2017-11-10 DIAGNOSIS — F330 Major depressive disorder, recurrent, mild: Secondary | ICD-10-CM | POA: Diagnosis not present

## 2017-11-10 DIAGNOSIS — C50919 Malignant neoplasm of unspecified site of unspecified female breast: Secondary | ICD-10-CM | POA: Diagnosis not present

## 2017-11-10 DIAGNOSIS — E039 Hypothyroidism, unspecified: Secondary | ICD-10-CM | POA: Diagnosis not present

## 2017-11-10 DIAGNOSIS — Z0000 Encounter for general adult medical examination without abnormal findings: Secondary | ICD-10-CM | POA: Diagnosis not present

## 2018-04-13 ENCOUNTER — Ambulatory Visit: Payer: Medicare PPO | Admitting: Oncology

## 2018-04-13 ENCOUNTER — Other Ambulatory Visit: Payer: Medicare PPO

## 2018-05-11 DIAGNOSIS — E559 Vitamin D deficiency, unspecified: Secondary | ICD-10-CM | POA: Diagnosis not present

## 2018-05-11 DIAGNOSIS — E039 Hypothyroidism, unspecified: Secondary | ICD-10-CM | POA: Diagnosis not present

## 2018-05-11 DIAGNOSIS — F330 Major depressive disorder, recurrent, mild: Secondary | ICD-10-CM | POA: Diagnosis not present

## 2018-05-11 DIAGNOSIS — E785 Hyperlipidemia, unspecified: Secondary | ICD-10-CM | POA: Diagnosis not present

## 2018-05-11 DIAGNOSIS — R7303 Prediabetes: Secondary | ICD-10-CM | POA: Diagnosis not present

## 2018-05-11 DIAGNOSIS — I10 Essential (primary) hypertension: Secondary | ICD-10-CM | POA: Diagnosis not present

## 2018-05-27 DIAGNOSIS — E559 Vitamin D deficiency, unspecified: Secondary | ICD-10-CM | POA: Diagnosis not present

## 2018-05-27 DIAGNOSIS — E039 Hypothyroidism, unspecified: Secondary | ICD-10-CM | POA: Diagnosis not present

## 2018-05-27 DIAGNOSIS — E785 Hyperlipidemia, unspecified: Secondary | ICD-10-CM | POA: Diagnosis not present

## 2018-05-27 DIAGNOSIS — I10 Essential (primary) hypertension: Secondary | ICD-10-CM | POA: Diagnosis not present

## 2018-05-27 DIAGNOSIS — R7303 Prediabetes: Secondary | ICD-10-CM | POA: Diagnosis not present

## 2018-06-17 ENCOUNTER — Encounter: Payer: Self-pay | Admitting: Oncology

## 2018-06-17 DIAGNOSIS — Z853 Personal history of malignant neoplasm of breast: Secondary | ICD-10-CM | POA: Diagnosis not present

## 2018-07-12 ENCOUNTER — Other Ambulatory Visit: Payer: Self-pay | Admitting: Oncology

## 2018-09-02 DIAGNOSIS — R350 Frequency of micturition: Secondary | ICD-10-CM | POA: Diagnosis not present

## 2018-09-02 DIAGNOSIS — N3091 Cystitis, unspecified with hematuria: Secondary | ICD-10-CM | POA: Diagnosis not present

## 2018-09-02 DIAGNOSIS — Z23 Encounter for immunization: Secondary | ICD-10-CM | POA: Diagnosis not present

## 2018-09-09 ENCOUNTER — Telehealth: Payer: Self-pay | Admitting: Oncology

## 2018-09-09 NOTE — Telephone Encounter (Signed)
R/s appt per 9/03 sch message - pt is aware of appt date and time

## 2018-09-21 DIAGNOSIS — R399 Unspecified symptoms and signs involving the genitourinary system: Secondary | ICD-10-CM | POA: Diagnosis not present

## 2018-09-21 NOTE — Progress Notes (Signed)
[facility]  [phone] [phone]     ID: Cynthia [name] DOB: March 17, 1945  MR#: [id number]  CHY#:[id number]  Patient Care Team: Cynthia Small, MD as PCP - General (Family Medicine) Cynthia Skates, MD as Consulting Physician (General Surgery) , Cynthia Dad, MD as Consulting Physician (Oncology) Cynthia Rudd, MD as Consulting Physician (Radiation Oncology) Cynthia Bison Charlestine Massed, NP as Nurse Practitioner (Hematology and Oncology) Cynthia Cruel, MD OTHER MD:  CHIEF COMPLAINT: Estrogen receptor positive breast cancer  CURRENT TREATMENT: Anastrozole   INTERVAL HISTORY: Cynthia [name] returns today for follow-up of her estrogen receptor positive breast cancer.   She continues on anastrozole, with good tolerance.  She has mild hot flashes and occasionally some dyspareunia. Since her last visit, she underwent bilateral diagnostic mammography with tomography at [facility] on 06/17/2018 showing: breast density category B; no evidence of malignancy in either breast.  Her most recent DEXA scan was at [facility] 11/02/2017 with a positive T score (1.4).   REVIEW OF SYSTEMS: Cynthia [name] is very concerned about her husband who has a history of seizures and a malignant brain tumor, and is being followed by Dr. Mickeal [name] here.  She of course is a primary caregiver.  Cynthia [name] has been having some hematuria.  He was evaluated by her primary care physician found to have a urinary tract infection and was treated but the hematuria recurred after a few days and she says a repeat culture was obtained and is pending.  She is not a smoker or former smoker.  Aside from these issues a detailed review of systems today was stable   BREAST CANCER HISTORY: From the original intake note:  Cynthia [name] had bilateral screening mammography with tomosynthesis at [facility] 02/26/2016. There was a new area of asymmetry in the right breast at the 4:00 position. She was recalled for diagnostic mammography 03/07/2016. The  breast density was category B. This found a new oval mass in the right breast upper inner quadrant with indistinct margins. Ultrasound of the right breast was performed the same day and confirmed a 0.6 cm oval mass in the right breast correlating with mammography findings. There was also a benign 3 mm cyst in the right breast upper inner quadrant middle depth  Biopsy of the right breast mass in question 03/10/2016 found ([id number]) invasive ductal carcinoma, grade 1, estrogen receptor 1 or percent positive, progesterone receptor 95% positive, both with strong staining intensity, with an MIB-1 of 10%, and no HER-2 implication, the signals ratio being 1.40 and the number per cell 2.10.  Her subsequent history is as detailed below.   PAST MEDICAL HISTORY: Past Medical History:  Diagnosis Date  . Anxiety   . Arthritis   . Cancer ([place])    breast cance  . DDD (degenerative disc disease), lumbar   . Depression   . Diabetes mellitus without complication ([place])    Takes metformin but has never been told she was diabetic  . Genetic testing 06/25/2016   Ms. Magoon underwent genetic counseling and testing for hereditary cancer syndromes on 06/17/2016. Her results were negative for mutations in all 46 genes analyzed by [facility]'s 46-gene Common Hereditary Cancers Panel. Genes analyzed include: APC, ATM, AXIN2, BARD1, BMPR1A, BRCA1, BRCA2, BRIP1, CDH1, CDKN2A, CHEK2, CTNNA1, DICER1, EPCAM, GREM1, HOXB13, KIT, MEN1, MLH1, MSH2, MSH3, MSH6, MUTYH, NBN,  . GERD (gastroesophageal reflux disease)    OTC tums  . History of radiation therapy 05/22/2016 to 06/20/2016  . Hypertension   . Hypothyroidism   . IBS (irritable bowel syndrome)   .  Insomnia   . Restless leg syndrome   . Thyroid disease     PAST SURGICAL HISTORY: Past Surgical History:  Procedure Laterality Date  . BREAST LUMPECTOMY WITH RADIOACTIVE SEED AND SENTINEL LYMPH NODE BIOPSY Right 04/01/2016   Procedure: RADIOACTIVE SEED GUIDED RIGHT BREAST  LUMPECTOMY WITH RIGHT AXILLARY SENTINEL LYMPH NODE BIOPSY.;  Surgeon: Cynthia Skates, MD;  Location: [facility];  Service: General;  Laterality: Right;  . CATARACT EXTRACTION    . CHOLECYSTECTOMY      FAMILY HISTORY Family History  Problem Relation Age of Onset  . Breast cancer Sister 66       d.54  . Leukemia Brother 37  . Cervical cancer Sister 75  . Ovarian cancer Maternal Aunt 92       d.92s  . Breast cancer Other 75  Patient's father died at the age of 59 following a fall. He had a history of cerebral palsy. The patient's mother died from congestive heart failure at the age of 11. The patient had 2 sisters. One was diagnosed with colon cancer at age 89. She survives. The second one died from breast cancer diagnosed age 63. She neglected the disease and it may have been present before she was 60. One of the patient's brothers was diagnosed with leukemia at age 53. The patient also has one niece diagnosed with breast cancer in her early 51s   GYNECOLOGIC HISTORY:  No LMP recorded. Patient is postmenopausal.  Menarche age 68, first live birth age 50, the patient is [place] P2, she stopped having periods about 20 years ago, didn't take hormone replacement for many years, stopping approximately 2005.   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 Her husband Cynthia [name] (goes by "Cynthia [name]") was in [employer], worked in the police department until he was run over by car, then worked for the [employer] on [employer] side, but is now retired. They have been married 42 years as of 2018. Cynthia [name] has a daughter, Cynthia [name], from an earlier marriage. She lives in [place] and works as a Marine scientist. Cynthia [name] and Cynthia [name] share a son, Cynthia [name], who lives in [place], and works for [employer]. The patient has 2 grandchildren. She attends a [employer].    ADVANCED DIRECTIVES: In place   HEALTH MAINTENANCE: Social History   Tobacco Use  . Smoking  status: Never Smoker  . Smokeless tobacco: Never Used  Substance Use Topics  . Alcohol use: Yes    Comment: occasionally  . Drug use: No     Colonoscopy:2012, in [place]  PAP: 2012   Bone density: 2015: Normal   Allergies  Allergen Reactions  . No Known Allergies     Current Outpatient Medications  Medication Sig Dispense Refill  . acetaminophen (TYLENOL) 500 MG tablet Take 500 mg by mouth every 6 (six) hours as needed (for headaches.).    Marland Kitchen ALPRAZolam (XANAX) 0.5 MG tablet Take 0.5 mg by mouth at bedtime.    Marland Kitchen anastrozole (ARIMIDEX) 1 MG tablet Take 1 tablet (1 mg total) by mouth daily. 90 tablet 4  . atenolol (TENORMIN) 25 MG tablet Take 25 mg by mouth 2 (two) times daily.    . Choline Fenofibrate (FENOFIBRIC ACID) 135 MG CPDR Take 135 mg by mouth daily.    Marland Kitchen FERROUS SULFATE PO Take 1 tablet by mouth every evening.    Marland Kitchen imipramine (TOFRANIL) 50 MG tablet Take 50 mg by mouth at  bedtime.    Marland Kitchen levothyroxine (SYNTHROID, LEVOTHROID) 112 MCG tablet Take 112 mcg by mouth at bedtime.    . metFORMIN (GLUCOPHAGE) 500 MG tablet Take 500 mg by mouth 2 (two) times daily.    . psyllium (REGULOID) 0.52 g capsule Take 2.08 g by mouth daily at 2 PM.    . traZODone (DESYREL) 150 MG tablet Take 150 mg by mouth at bedtime.    . triamterene-hydrochlorothiazide (MAXZIDE-25) 37.5-25 MG tablet Take 1 tablet by mouth daily.     No current facility-administered medications for this visit.     OBJECTIVE: Middle-aged white woman in no acute distress  Vitals:   09/22/18 1538  BP: 122/70  Pulse: 65  Resp: 18  Temp: 98.5 F (36.9 C)  SpO2: 100%     Body mass index is 26.02 kg/m.    ECOG FS:0 - Asymptomatic  Sclerae unicteric, EOMs intact Wearing a mask No cervical or supraclavicular adenopathy Lungs no rales or rhonchi Heart regular rate and rhythm Abd soft, nontender, positive bowel sounds MSK no focal spinal tenderness, no upper extremity lymphedema Neuro: nonfocal, well  oriented, appropriate affect Breasts: The right breast is status post lumpectomy and radiation.  There is no evidence of disease recurrence.  The left breast is benign.  Both axillae are benign.   LAB RESULTS:  CMP     Component Value Date/Time   NA 142 09/22/2018 1513   NA 141 03/19/2016 1216   K 3.2 (L) 09/22/2018 1513   K 3.5 03/19/2016 1216   CL 103 09/22/2018 1513   CO2 31 09/22/2018 1513   CO2 29 03/19/2016 1216   GLUCOSE 152 (H) 09/22/2018 1513   GLUCOSE 109 03/19/2016 1216   BUN 19 09/22/2018 1513   BUN 19.2 03/19/2016 1216   CREATININE 0.96 09/22/2018 1513   CREATININE 0.89 07/07/2017 1333   CREATININE 1.4 (H) 03/19/2016 1216   CALCIUM 9.4 09/22/2018 1513   CALCIUM 9.9 03/19/2016 1216   PROT 7.1 09/22/2018 1513   PROT 7.2 03/19/2016 1216   ALBUMIN 3.9 09/22/2018 1513   ALBUMIN 3.7 03/19/2016 1216   AST 19 09/22/2018 1513   AST 16 07/07/2017 1333   AST 15 03/19/2016 1216   ALT 10 09/22/2018 1513   ALT 11 07/07/2017 1333   ALT 10 03/19/2016 1216   ALKPHOS 42 09/22/2018 1513   ALKPHOS 45 03/19/2016 1216   BILITOT 0.4 09/22/2018 1513   BILITOT 0.4 07/07/2017 1333   BILITOT 0.42 03/19/2016 1216   GFRNONAA 58 (L) 09/22/2018 1513   GFRNONAA >60 07/07/2017 1333   GFRAA >60 09/22/2018 1513   GFRAA >60 07/07/2017 1333    INo results found for: SPEP, UPEP  Lab Results  Component Value Date   WBC 5.7 09/22/2018   NEUTROABS 3.1 09/22/2018   HGB 13.3 09/22/2018   HCT 41.7 09/22/2018   MCV 94.3 09/22/2018   PLT 315 09/22/2018      Chemistry      Component Value Date/Time   NA 142 09/22/2018 1513   NA 141 03/19/2016 1216   K 3.2 (L) 09/22/2018 1513   K 3.5 03/19/2016 1216   CL 103 09/22/2018 1513   CO2 31 09/22/2018 1513   CO2 29 03/19/2016 1216   BUN 19 09/22/2018 1513   BUN 19.2 03/19/2016 1216   CREATININE 0.96 09/22/2018 1513   CREATININE 0.89 07/07/2017 1333   CREATININE 1.4 (H) 03/19/2016 1216      Component Value Date/Time   CALCIUM 9.4  09/22/2018 1513  CALCIUM 9.9 03/19/2016 1216   ALKPHOS 42 09/22/2018 1513   ALKPHOS 45 03/19/2016 1216   AST 19 09/22/2018 1513   AST 16 07/07/2017 1333   AST 15 03/19/2016 1216   ALT 10 09/22/2018 1513   ALT 11 07/07/2017 1333   ALT 10 03/19/2016 1216   BILITOT 0.4 09/22/2018 1513   BILITOT 0.4 07/07/2017 1333   BILITOT 0.42 03/19/2016 1216       No results found for: LABCA2  No components found for: LABCA125  No results for input(s): INR in the last 168 hours.  Urinalysis No results found for: COLORURINE, APPEARANCEUR, LABSPEC, PHURINE, GLUCOSEU, HGBUR, BILIRUBINUR, KETONESUR, PROTEINUR, UROBILINOGEN, NITRITE, LEUKOCYTESUR   STUDIES: No results found.    ELIGIBLE FOR AVAILABLE RESEARCH PROTOCOL: *no  ASSESSMENT: 73 y.o. [place] woman status post right breast upper inner quadrant biopsy 03/10/2016 for a clinical T1b N0, stage IA invasive ductal carcinoma, grade 1, estrogen and progesterone receptor positive, HER-2 nonamplified, with an MIB-1 of 10%  (1)Status post right lumpectomy and sentinel lymph node sampling 04/01/2016 for a pT1b pN0, stage IA invasive ductal carcinoma, grade 1, with negative margins  (2) given the patient's excellent anatomic prognosis and age, no Oncotype and no chemotherapy is planned  (3) adjuvant radiation 05/22/2016 to 06/20/2016 1. The Right breast was treated to 42.5 Gy in 17 fractions in 2.5 Gy per fraction. 2. The Right breast was boosted to 7.5 Gy in 3 fractions at 2.5 Gy per fraction.   (4) anastrozole started 07/15/2016  (a) bone density July 2019 normal  (5) Genetics testing 06/23/2016 through [facility]'s Common Hereditary Cancers Panel found no deleterious mutations in APC, ATM, AXIN2, BARD1, BMPR1A, BRCA1, BRCA2, BRIP1, CDH1, CDKN2A, CHEK2, CTNNA1, DICER1, EPCAM, GREM1, HOXB13, KIT, MEN1, MLH1, MSH2, MSH3, MSH6, MUTYH, NBN, NF1, NTHL1, PALB2, PDGFRA, PMS2, POLD1, POLE, PTEN, RAD50, RAD51C, RAD51D, SDHA, SDHB, SDHC, SDHD, SMAD4,  SMARCA4, STK11, TP53, TSC1, TSC2, and VHL.    PLAN: Bernadine is now 2-1/2 years out from definitive surgery for breast cancer with no evidence of disease recurrence.  This is very favorable.  She is tolerating anastrozole well and the plan is to continue that for 5 years.  I gave her information on our pelvic floor rehabilitation program but at this point she does not believe she wants to follow-up on.  She will see me again in 1 year.  She will have a repeat bone density prior to that visit.  She knows to call for any other issue that may develop before then.   , Cynthia Dad, MD  09/22/18 3:54 PM Medical Oncology and Hematology [facility] [place], [place] [address] Tel. [phone]    Fax. [phone]   I, Wilburn Mylar, am acting as scribe for Dr. Virgie [name]. .  I, Lurline Del MD, have reviewed the above documentation for accuracy and completeness, and I agree with the above.

## 2018-09-22 ENCOUNTER — Inpatient Hospital Stay (HOSPITAL_BASED_OUTPATIENT_CLINIC_OR_DEPARTMENT_OTHER): Payer: Medicare PPO | Admitting: Oncology

## 2018-09-22 ENCOUNTER — Other Ambulatory Visit: Payer: Self-pay

## 2018-09-22 ENCOUNTER — Inpatient Hospital Stay: Payer: Medicare PPO | Attending: Oncology

## 2018-09-22 VITALS — BP 122/70 | HR 65 | Temp 98.5°F | Resp 18 | Ht 66.0 in | Wt 161.2 lb

## 2018-09-22 DIAGNOSIS — M5136 Other intervertebral disc degeneration, lumbar region: Secondary | ICD-10-CM | POA: Insufficient documentation

## 2018-09-22 DIAGNOSIS — C50211 Malignant neoplasm of upper-inner quadrant of right female breast: Secondary | ICD-10-CM | POA: Insufficient documentation

## 2018-09-22 DIAGNOSIS — Z79811 Long term (current) use of aromatase inhibitors: Secondary | ICD-10-CM | POA: Diagnosis not present

## 2018-09-22 DIAGNOSIS — Z803 Family history of malignant neoplasm of breast: Secondary | ICD-10-CM | POA: Insufficient documentation

## 2018-09-22 DIAGNOSIS — M129 Arthropathy, unspecified: Secondary | ICD-10-CM | POA: Diagnosis not present

## 2018-09-22 DIAGNOSIS — E039 Hypothyroidism, unspecified: Secondary | ICD-10-CM | POA: Diagnosis not present

## 2018-09-22 DIAGNOSIS — Z170 Estrogen receptor positive status [ER+]: Secondary | ICD-10-CM | POA: Diagnosis not present

## 2018-09-22 DIAGNOSIS — F419 Anxiety disorder, unspecified: Secondary | ICD-10-CM | POA: Insufficient documentation

## 2018-09-22 DIAGNOSIS — E119 Type 2 diabetes mellitus without complications: Secondary | ICD-10-CM | POA: Insufficient documentation

## 2018-09-22 DIAGNOSIS — R232 Flushing: Secondary | ICD-10-CM | POA: Insufficient documentation

## 2018-09-22 DIAGNOSIS — K219 Gastro-esophageal reflux disease without esophagitis: Secondary | ICD-10-CM | POA: Insufficient documentation

## 2018-09-22 DIAGNOSIS — G2581 Restless legs syndrome: Secondary | ICD-10-CM | POA: Insufficient documentation

## 2018-09-22 DIAGNOSIS — Z7984 Long term (current) use of oral hypoglycemic drugs: Secondary | ICD-10-CM | POA: Diagnosis not present

## 2018-09-22 DIAGNOSIS — I10 Essential (primary) hypertension: Secondary | ICD-10-CM | POA: Diagnosis not present

## 2018-09-22 DIAGNOSIS — G4700 Insomnia, unspecified: Secondary | ICD-10-CM | POA: Diagnosis not present

## 2018-09-22 LAB — COMPREHENSIVE METABOLIC PANEL
ALT: 10 U/L (ref 0–44)
AST: 19 U/L (ref 15–41)
Albumin: 3.9 g/dL (ref 3.5–5.0)
Alkaline Phosphatase: 42 U/L (ref 38–126)
Anion gap: 8 (ref 5–15)
BUN: 19 mg/dL (ref 8–23)
CO2: 31 mmol/L (ref 22–32)
Calcium: 9.4 mg/dL (ref 8.9–10.3)
Chloride: 103 mmol/L (ref 98–111)
Creatinine, Ser: 0.96 mg/dL (ref 0.44–1.00)
GFR calc Af Amer: >60 mL/min (ref >60)
GFR calc non Af Amer: 58 mL/min — ABNORMAL LOW (ref >60)
Glucose, Bld: 152 mg/dL — ABNORMAL HIGH (ref 70–99)
Potassium: 3.2 mmol/L — ABNORMAL LOW (ref 3.5–5.1)
Sodium: 142 mmol/L (ref 135–145)
Total Bilirubin: 0.4 mg/dL (ref 0.3–1.2)
Total Protein: 7.1 g/dL (ref 6.5–8.1)

## 2018-09-22 LAB — CBC WITH DIFFERENTIAL/PLATELET
Abs Immature Granulocytes: 0.02 10*3/uL (ref 0.00–0.07)
Basophils Absolute: 0.1 10*3/uL (ref 0.0–0.1)
Basophils Relative: 1 %
Eosinophils Absolute: 0.2 10*3/uL (ref 0.0–0.5)
Eosinophils Relative: 3 %
HCT: 41.7 % (ref 36.0–46.0)
Hemoglobin: 13.3 g/dL (ref 12.0–15.0)
Immature Granulocytes: 0 %
Lymphocytes Relative: 36 %
Lymphs Abs: 2.1 10*3/uL (ref 0.7–4.0)
MCH: 30.1 pg (ref 26.0–34.0)
MCHC: 31.9 g/dL (ref 30.0–36.0)
MCV: 94.3 fL (ref 80.0–100.0)
Monocytes Absolute: 0.3 10*3/uL (ref 0.1–1.0)
Monocytes Relative: 6 %
Neutro Abs: 3.1 10*3/uL (ref 1.7–7.7)
Neutrophils Relative %: 54 %
Platelets: 315 10*3/uL (ref 150–400)
RBC: 4.42 MIL/uL (ref 3.87–5.11)
RDW: 13.1 % (ref 11.5–15.5)
WBC: 5.7 10*3/uL (ref 4.0–10.5)
nRBC: 0 % (ref 0.0–0.2)

## 2018-09-22 MED ORDER — ANASTROZOLE 1 MG PO TABS: 1.0000 mg | ORAL_TABLET | Freq: Every day | ORAL | 4 refills | Status: DC

## 2018-09-23 ENCOUNTER — Telehealth: Payer: Self-pay | Admitting: Oncology

## 2018-09-23 NOTE — Telephone Encounter (Signed)
I talk with patient regarding schedule  

## 2018-09-27 ENCOUNTER — Ambulatory Visit: Payer: Medicare PPO | Admitting: Oncology

## 2018-09-27 ENCOUNTER — Other Ambulatory Visit: Payer: Medicare PPO

## 2018-12-10 ENCOUNTER — Other Ambulatory Visit: Payer: Self-pay

## 2018-12-10 ENCOUNTER — Emergency Department (HOSPITAL_COMMUNITY): Payer: Medicare PPO

## 2018-12-10 ENCOUNTER — Inpatient Hospital Stay (HOSPITAL_COMMUNITY)
Admission: EM | Admit: 2018-12-10 | Discharge: 2018-12-15 | DRG: 310 | Disposition: A | Discharge status: Home / Self Care | Payer: Medicare PPO | Attending: Internal Medicine | Admitting: Internal Medicine

## 2018-12-10 DIAGNOSIS — R55 Syncope and collapse: Secondary | ICD-10-CM | POA: Diagnosis not present

## 2018-12-10 DIAGNOSIS — Z79890 Hormone replacement therapy (postmenopausal): Secondary | ICD-10-CM | POA: Diagnosis not present

## 2018-12-10 DIAGNOSIS — E039 Hypothyroidism, unspecified: Secondary | ICD-10-CM | POA: Diagnosis present

## 2018-12-10 DIAGNOSIS — I10 Essential (primary) hypertension: Secondary | ICD-10-CM | POA: Diagnosis not present

## 2018-12-10 DIAGNOSIS — I429 Cardiomyopathy, unspecified: Secondary | ICD-10-CM | POA: Diagnosis present

## 2018-12-10 DIAGNOSIS — Z20828 Contact with and (suspected) exposure to other viral communicable diseases: Secondary | ICD-10-CM | POA: Diagnosis present

## 2018-12-10 DIAGNOSIS — Z8049 Family history of malignant neoplasm of other genital organs: Secondary | ICD-10-CM | POA: Diagnosis not present

## 2018-12-10 DIAGNOSIS — R7303 Prediabetes: Secondary | ICD-10-CM | POA: Diagnosis not present

## 2018-12-10 DIAGNOSIS — I480 Paroxysmal atrial fibrillation: Secondary | ICD-10-CM | POA: Diagnosis not present

## 2018-12-10 DIAGNOSIS — R399 Unspecified symptoms and signs involving the genitourinary system: Secondary | ICD-10-CM | POA: Diagnosis not present

## 2018-12-10 DIAGNOSIS — Z923 Personal history of irradiation: Secondary | ICD-10-CM | POA: Diagnosis not present

## 2018-12-10 DIAGNOSIS — Z79811 Long term (current) use of aromatase inhibitors: Secondary | ICD-10-CM

## 2018-12-10 DIAGNOSIS — I499 Cardiac arrhythmia, unspecified: Secondary | ICD-10-CM | POA: Diagnosis not present

## 2018-12-10 DIAGNOSIS — Z8249 Family history of ischemic heart disease and other diseases of the circulatory system: Secondary | ICD-10-CM | POA: Diagnosis not present

## 2018-12-10 DIAGNOSIS — I472 Ventricular tachycardia: Secondary | ICD-10-CM | POA: Diagnosis not present

## 2018-12-10 DIAGNOSIS — I491 Atrial premature depolarization: Secondary | ICD-10-CM | POA: Diagnosis not present

## 2018-12-10 DIAGNOSIS — G2581 Restless legs syndrome: Secondary | ICD-10-CM | POA: Diagnosis present

## 2018-12-10 DIAGNOSIS — R61 Generalized hyperhidrosis: Secondary | ICD-10-CM | POA: Diagnosis not present

## 2018-12-10 DIAGNOSIS — F419 Anxiety disorder, unspecified: Secondary | ICD-10-CM | POA: Diagnosis present

## 2018-12-10 DIAGNOSIS — R0902 Hypoxemia: Secondary | ICD-10-CM | POA: Diagnosis not present

## 2018-12-10 DIAGNOSIS — Z853 Personal history of malignant neoplasm of breast: Secondary | ICD-10-CM | POA: Diagnosis not present

## 2018-12-10 DIAGNOSIS — I340 Nonrheumatic mitral (valve) insufficiency: Secondary | ICD-10-CM | POA: Diagnosis not present

## 2018-12-10 DIAGNOSIS — Z8041 Family history of malignant neoplasm of ovary: Secondary | ICD-10-CM

## 2018-12-10 DIAGNOSIS — Z7984 Long term (current) use of oral hypoglycemic drugs: Secondary | ICD-10-CM | POA: Diagnosis not present

## 2018-12-10 DIAGNOSIS — I4891 Unspecified atrial fibrillation: Secondary | ICD-10-CM | POA: Diagnosis not present

## 2018-12-10 DIAGNOSIS — M1990 Unspecified osteoarthritis, unspecified site: Secondary | ICD-10-CM | POA: Diagnosis not present

## 2018-12-10 DIAGNOSIS — R5382 Chronic fatigue, unspecified: Secondary | ICD-10-CM | POA: Diagnosis not present

## 2018-12-10 DIAGNOSIS — M5136 Other intervertebral disc degeneration, lumbar region: Secondary | ICD-10-CM | POA: Diagnosis present

## 2018-12-10 DIAGNOSIS — K219 Gastro-esophageal reflux disease without esophagitis: Secondary | ICD-10-CM | POA: Diagnosis not present

## 2018-12-10 DIAGNOSIS — Z806 Family history of leukemia: Secondary | ICD-10-CM | POA: Diagnosis not present

## 2018-12-10 DIAGNOSIS — E559 Vitamin D deficiency, unspecified: Secondary | ICD-10-CM | POA: Diagnosis not present

## 2018-12-10 DIAGNOSIS — I313 Pericardial effusion (noninflammatory): Secondary | ICD-10-CM | POA: Diagnosis not present

## 2018-12-10 DIAGNOSIS — Z803 Family history of malignant neoplasm of breast: Secondary | ICD-10-CM

## 2018-12-10 DIAGNOSIS — E119 Type 2 diabetes mellitus without complications: Secondary | ICD-10-CM | POA: Insufficient documentation

## 2018-12-10 LAB — COMPREHENSIVE METABOLIC PANEL
ALT: 15 U/L (ref 0–44)
AST: 21 U/L (ref 15–41)
Albumin: 3.7 g/dL (ref 3.5–5.0)
Alkaline Phosphatase: 37 U/L — ABNORMAL LOW (ref 38–126)
Anion gap: 11 (ref 5–15)
BUN: 17 mg/dL (ref 8–23)
CO2: 23 mmol/L (ref 22–32)
Calcium: 9.3 mg/dL (ref 8.9–10.3)
Chloride: 105 mmol/L (ref 98–111)
Creatinine, Ser: 0.78 mg/dL (ref 0.44–1.00)
GFR calc Af Amer: >60 mL/min (ref >60)
GFR calc non Af Amer: >60 mL/min (ref >60)
Glucose, Bld: 88 mg/dL (ref 70–99)
Potassium: 3.6 mmol/L (ref 3.5–5.1)
Sodium: 139 mmol/L (ref 135–145)
Total Bilirubin: 0.4 mg/dL (ref 0.3–1.2)
Total Protein: 6.6 g/dL (ref 6.5–8.1)

## 2018-12-10 LAB — CBC WITH DIFFERENTIAL/PLATELET
Abs Immature Granulocytes: 0.02 10*3/uL (ref 0.00–0.07)
Basophils Absolute: 0.1 10*3/uL (ref 0.0–0.1)
Basophils Relative: 1 %
Eosinophils Absolute: 0.1 10*3/uL (ref 0.0–0.5)
Eosinophils Relative: 2 %
HCT: 42.7 % (ref 36.0–46.0)
Hemoglobin: 13.7 g/dL (ref 12.0–15.0)
Immature Granulocytes: 0 %
Lymphocytes Relative: 28 %
Lymphs Abs: 2.1 10*3/uL (ref 0.7–4.0)
MCH: 30.4 pg (ref 26.0–34.0)
MCHC: 32.1 g/dL (ref 30.0–36.0)
MCV: 94.9 fL (ref 80.0–100.0)
Monocytes Absolute: 0.5 10*3/uL (ref 0.1–1.0)
Monocytes Relative: 7 %
Neutro Abs: 4.6 10*3/uL (ref 1.7–7.7)
Neutrophils Relative %: 62 %
Platelets: 309 10*3/uL (ref 150–400)
RBC: 4.5 MIL/uL (ref 3.87–5.11)
RDW: 13 % (ref 11.5–15.5)
WBC: 7.4 10*3/uL (ref 4.0–10.5)
nRBC: 0 % (ref 0.0–0.2)

## 2018-12-10 LAB — URINALYSIS, COMPLETE (UACMP) WITH MICROSCOPIC
Bacteria, UA: NONE SEEN
Bilirubin Urine: NEGATIVE
Glucose, UA: NEGATIVE mg/dL
Ketones, ur: NEGATIVE mg/dL
Leukocytes,Ua: NEGATIVE
Nitrite: NEGATIVE
Protein, ur: NEGATIVE mg/dL
Specific Gravity, Urine: 1.011 (ref 1.005–1.030)
pH: 6 (ref 5.0–8.0)

## 2018-12-10 LAB — TSH: TSH: 0.895 u[IU]/mL (ref 0.350–4.500)

## 2018-12-10 LAB — MAGNESIUM: Magnesium: 1.7 mg/dL (ref 1.7–2.4)

## 2018-12-10 LAB — HEMOGLOBIN A1C
Hgb A1c MFr Bld: 6.1 % — ABNORMAL HIGH (ref 4.8–5.6)
Mean Plasma Glucose: 128.37 mg/dL

## 2018-12-10 LAB — TROPONIN I (HIGH SENSITIVITY)
Troponin I (High Sensitivity): 6 ng/L (ref <18)
Troponin I (High Sensitivity): 6 ng/L (ref <18)

## 2018-12-10 LAB — SARS CORONAVIRUS 2 (TAT 6-24 HRS): SARS Coronavirus 2: NEGATIVE

## 2018-12-10 LAB — GLUCOSE, CAPILLARY: Glucose-Capillary: 192 mg/dL — ABNORMAL HIGH (ref 70–99)

## 2018-12-10 MED ORDER — LEVOTHYROXINE SODIUM 112 MCG PO TABS
112.0000 ug | ORAL_TABLET | Freq: Every day | ORAL | Status: DC
  Administered 2018-12-11 – 2018-12-15 (×5): 112 ug via ORAL
  Filled 2018-12-10 (×5): qty 1

## 2018-12-10 MED ORDER — INSULIN ASPART 100 UNIT/ML ~~LOC~~ SOLN: 0.0000 [IU] | Freq: Three times a day (TID) | SUBCUTANEOUS | Status: DC

## 2018-12-10 MED ORDER — IMIPRAMINE HCL 25 MG PO TABS
50.0000 mg | ORAL_TABLET | Freq: Every day | ORAL | Status: DC
  Administered 2018-12-10 – 2018-12-14 (×5): 50 mg via ORAL
  Filled 2018-12-10 (×6): qty 2

## 2018-12-10 MED ORDER — ALPRAZOLAM 0.5 MG PO TABS
0.5000 mg | ORAL_TABLET | Freq: Every day | ORAL | Status: DC
  Administered 2018-12-10 – 2018-12-13 (×4): 0.5 mg via ORAL
  Filled 2018-12-10 (×4): qty 1

## 2018-12-10 MED ORDER — INSULIN ASPART 100 UNIT/ML ~~LOC~~ SOLN: 0.0000 [IU] | Freq: Every day | SUBCUTANEOUS | Status: DC

## 2018-12-10 MED ORDER — HEPARIN (PORCINE) 25000 UT/250ML-% IV SOLN
1000.0000 [IU]/h | INTRAVENOUS | Status: DC
  Administered 2018-12-10: 1000 [IU]/h via INTRAVENOUS
  Filled 2018-12-10: qty 250

## 2018-12-10 MED ORDER — METOPROLOL TARTRATE 25 MG PO TABS
25.0000 mg | ORAL_TABLET | Freq: Two times a day (BID) | ORAL | Status: DC
  Administered 2018-12-10 – 2018-12-11 (×2): 25 mg via ORAL
  Filled 2018-12-10 (×2): qty 1

## 2018-12-10 MED ORDER — ACETAMINOPHEN 325 MG PO TABS: 650.0000 mg | ORAL_TABLET | ORAL | Status: DC | PRN

## 2018-12-10 MED ORDER — POTASSIUM CHLORIDE CRYS ER 20 MEQ PO TBCR
40.0000 meq | EXTENDED_RELEASE_TABLET | Freq: Once | ORAL | Status: AC
  Administered 2018-12-10: 40 meq via ORAL
  Filled 2018-12-10: qty 2

## 2018-12-10 MED ORDER — AMIODARONE LOAD VIA INFUSION
150.0000 mg | Freq: Once | INTRAVENOUS | Status: AC
  Administered 2018-12-10: 150 mg via INTRAVENOUS
  Filled 2018-12-10: qty 83.34

## 2018-12-10 MED ORDER — PSYLLIUM 95 % PO PACK
1.0000 | PACK | ORAL | Status: DC
  Administered 2018-12-11: 1 via ORAL
  Filled 2018-12-10 (×4): qty 1

## 2018-12-10 MED ORDER — TRAZODONE HCL 150 MG PO TABS
150.0000 mg | ORAL_TABLET | Freq: Every day | ORAL | Status: DC
  Administered 2018-12-10 – 2018-12-14 (×5): 150 mg via ORAL
  Filled 2018-12-10 (×5): qty 1

## 2018-12-10 MED ORDER — FENOFIBRATE 54 MG PO TABS
54.0000 mg | ORAL_TABLET | Freq: Every day | ORAL | Status: DC
  Administered 2018-12-11 – 2018-12-15 (×5): 54 mg via ORAL
  Filled 2018-12-10 (×5): qty 1

## 2018-12-10 MED ORDER — ONDANSETRON HCL 4 MG/2ML IJ SOLN: 4.0000 mg | Freq: Four times a day (QID) | INTRAMUSCULAR | Status: DC | PRN

## 2018-12-10 MED ORDER — DILTIAZEM HCL-DEXTROSE 125-5 MG/125ML-% IV SOLN (PREMIX)
5.0000 mg/h | INTRAVENOUS | Status: DC
  Administered 2018-12-10: 5 mg/h via INTRAVENOUS
  Filled 2018-12-10: qty 125

## 2018-12-10 MED ORDER — MAGNESIUM SULFATE 2 GM/50ML IV SOLN
2.0000 g | Freq: Once | INTRAVENOUS | Status: AC
  Administered 2018-12-11: 2 g via INTRAVENOUS
  Filled 2018-12-10: qty 50

## 2018-12-10 MED ORDER — FENOFIBRATE 54 MG PO TABS: 108.0000 mg | ORAL_TABLET | Freq: Every day | ORAL | Status: DC

## 2018-12-10 MED ORDER — HEPARIN BOLUS VIA INFUSION
4000.0000 [IU] | Freq: Once | INTRAVENOUS | Status: AC
  Administered 2018-12-10: 4000 [IU] via INTRAVENOUS
  Filled 2018-12-10: qty 4000

## 2018-12-10 MED ORDER — AMIODARONE HCL IN DEXTROSE 360-4.14 MG/200ML-% IV SOLN
60.0000 mg/h | INTRAVENOUS | Status: DC
  Administered 2018-12-10 (×2): 60 mg/h via INTRAVENOUS
  Filled 2018-12-10 (×2): qty 200

## 2018-12-10 MED ORDER — AMIODARONE HCL IN DEXTROSE 360-4.14 MG/200ML-% IV SOLN
30.0000 mg/h | INTRAVENOUS | Status: DC
  Administered 2018-12-11 (×2): 30 mg/h via INTRAVENOUS
  Filled 2018-12-10: qty 200

## 2018-12-10 MED ORDER — DILTIAZEM LOAD VIA INFUSION
10.0000 mg | Freq: Once | INTRAVENOUS | Status: AC
  Administered 2018-12-10: 10 mg via INTRAVENOUS
  Filled 2018-12-10: qty 10

## 2018-12-10 MED ORDER — ANASTROZOLE 1 MG PO TABS
1.0000 mg | ORAL_TABLET | Freq: Every day | ORAL | Status: DC
  Administered 2018-12-11 – 2018-12-15 (×5): 1 mg via ORAL
  Filled 2018-12-10 (×5): qty 1

## 2018-12-10 NOTE — ED Provider Notes (Addendum)
[facility] EMERGENCY DEPARTMENT Provider Note   CSN: [id number] Arrival date & time: 12/10/18  1209     History   Chief Complaint Chief Complaint  Patient presents with  . Atrial Fibrillation  . Vtach    HPI Cynthia [name] is a 73 y.o. female.     Level 5 caveat for acuity of condition.  Chief complaint syncope and abnormal heart rhythm.  Patient reports 2-3 near syncopal spells in the last week.  She was evaluated by [facility] today and her rhythm strip revealed a combination of atrial fibrillation and runs of V. tach.  No specific chest pain or dyspnea.  However, she is experiencing diaphoresis.  This has never happened before.  Patient transferred to the emergency department by EMS.     Past Medical History:  Diagnosis Date  . Anxiety   . Arthritis   . Cancer ([place])    breast cance  . DDD (degenerative disc disease), lumbar   . Depression   . Diabetes mellitus without complication ([place])    Takes metformin but has never been told she was diabetic  . Genetic testing 06/25/2016   Ms. Hiltner underwent genetic counseling and testing for hereditary cancer syndromes on 06/17/2016. Her results were negative for mutations in all 46 genes analyzed by [facility]'s 46-gene Common Hereditary Cancers Panel. Genes analyzed include: APC, ATM, AXIN2, BARD1, BMPR1A, BRCA1, BRCA2, BRIP1, CDH1, CDKN2A, CHEK2, CTNNA1, DICER1, EPCAM, GREM1, HOXB13, KIT, MEN1, MLH1, MSH2, MSH3, MSH6, MUTYH, NBN,  . GERD (gastroesophageal reflux disease)    OTC tums  . History of radiation therapy 05/22/2016 to 06/20/2016  . Hypertension   . Hypothyroidism   . IBS (irritable bowel syndrome)   . Insomnia   . Restless leg syndrome   . Thyroid disease     Patient Active Problem List   Diagnosis Date Noted  . Genetic testing 06/25/2016  . Malignant neoplasm of upper-inner quadrant of right breast in female, estrogen receptor positive ([place]) 03/18/2016    Past Surgical History:   Procedure Laterality Date  . BREAST LUMPECTOMY WITH RADIOACTIVE SEED AND SENTINEL LYMPH NODE BIOPSY Right 04/01/2016   Procedure: RADIOACTIVE SEED GUIDED RIGHT BREAST LUMPECTOMY WITH RIGHT AXILLARY SENTINEL LYMPH NODE BIOPSY.;  Surgeon: Fanny Skates, MD;  Location: [facility];  Service: General;  Laterality: Right;  . CATARACT EXTRACTION    . CHOLECYSTECTOMY       OB History   No obstetric history on file.      Home Medications    Prior to Admission medications   Medication Sig Start Date End Date Taking? Authorizing Provider  acetaminophen (TYLENOL) 500 MG tablet Take 500 mg by mouth every 6 (six) hours as needed (for headaches.).    [provider]  ALPRAZolam Duanne Moron) 0.5 MG tablet Take 0.5 mg by mouth at bedtime. 03/12/16   [provider]  anastrozole (ARIMIDEX) 1 MG tablet Take 1 tablet (1 mg total) by mouth daily. 09/22/18   Magrinat, [name], MD  atenolol (TENORMIN) 25 MG tablet Take 25 mg by mouth 2 (two) times daily. 12/28/15   [provider]  Choline Fenofibrate (FENOFIBRIC ACID) 135 MG CPDR Take 135 mg by mouth daily. 01/16/16   [provider]  FERROUS SULFATE PO Take 1 tablet by mouth every evening.    [provider]  imipramine (TOFRANIL) 50 MG tablet Take 50 mg by mouth at bedtime. 01/16/16   [provider]  levothyroxine (SYNTHROID, LEVOTHROID) 112 MCG tablet Take 112 mcg by  mouth at bedtime. 03/13/16   [provider]  metFORMIN (GLUCOPHAGE) 500 MG tablet Take 500 mg by mouth 2 (two) times daily. 01/16/16   [provider]  psyllium (REGULOID) 0.52 g capsule Take 2.08 g by mouth daily at 2 PM.    [provider]  traZODone (DESYREL) 150 MG tablet Take 150 mg by mouth at bedtime. 03/01/16   [provider]  triamterene-hydrochlorothiazide (MAXZIDE-25) 37.5-25 MG tablet Take 1 tablet by mouth daily. 01/24/16   [provider]    Family History Family History  Problem Relation Age  of Onset  . Breast cancer Sister 38       d.54  . Leukemia Brother 26  . Cervical cancer Sister 75  . Ovarian cancer Maternal Aunt 92       d.92s  . Breast cancer Other 99    Social History Social History   Tobacco Use  . Smoking status: Never Smoker  . Smokeless tobacco: Never Used  Substance Use Topics  . Alcohol use: Yes    Comment: occasionally  . Drug use: No     Allergies   No known allergies   Review of Systems Review of Systems  Unable to perform ROS: Acuity of condition     Physical Exam Updated Vital Signs BP (!) 132/98 (BP Location: Right Arm)   Pulse 93   Temp 97.7 F (36.5 C) (Oral)   Resp 17   Ht 5' 5.5" (1.664 m)   Wt 73.5 kg   SpO2 100%   BMI 26.55 kg/m   Physical Exam Vitals signs and nursing note reviewed.  Constitutional:      Appearance: She is well-developed.     Comments: Pleasant, conversant, slightly diaphoretic.  HENT:     Head: Normocephalic and atraumatic.  Eyes:     Conjunctiva/sclera: Conjunctivae normal.  Neck:     Musculoskeletal: Neck supple.  Cardiovascular:     Comments: Irregularly irregular rhythm. Pulmonary:     Effort: Pulmonary effort is normal.     Breath sounds: Normal breath sounds.  Abdominal:     General: Bowel sounds are normal.     Palpations: Abdomen is soft.  Musculoskeletal: Normal range of motion.  Skin:    General: Skin is warm and dry.  Neurological:     General: No focal deficit present.     Mental Status: She is alert and oriented to person, place, and time.  Psychiatric:        Behavior: Behavior normal.      ED Treatments / Results  Labs (all labs ordered are listed, but only abnormal results are displayed) Labs Reviewed  SARS CORONAVIRUS 2 (TAT 6-24 HRS)  CBC WITH DIFFERENTIAL/PLATELET  COMPREHENSIVE METABOLIC PANEL  TROPONIN I (HIGH SENSITIVITY)    EKG EKG Interpretation  Date/Time:  Friday December 10 2018 12:12:13 EST Ventricular Rate:  105 PR Interval:    QRS  Duration: 91 QT Interval:  376 QTC Calculation: 495 R Axis:   36 Text Interpretation: Atrial fibrillation Paired ventricular premature complexes Abnormal R-wave progression, early transition Borderline prolonged QT interval Confirmed by Nat Christen [phone]) on 12/10/2018 12:32:00 PM   Radiology Dg Chest Port 1 View  Result Date: 12/10/2018 CLINICAL DATA:  Syncope. EXAM: PORTABLE CHEST 1 VIEW COMPARISON:  None. FINDINGS: The heart size and mediastinal contours are within normal limits. Both lungs are clear. The visualized skeletal structures are unremarkable. IMPRESSION: No active disease. Electronically Signed   By: Bobbe Medico.D.  On: 12/10/2018 12:44    Procedures Procedures (including critical care time)  Medications Ordered in ED Medications  diltiazem (CARDIZEM) 1 mg/mL load via infusion 10 mg (has no administration in time range)    And  diltiazem (CARDIZEM) 125 mg in dextrose 5% 125 mL (1 mg/mL) infusion (has no administration in time range)     Initial Impression / Assessment and Plan / ED Course  I have reviewed the triage vital signs and the nursing notes.  Pertinent labs & imaging results that were available during my care of the patient were reviewed by me and considered in my medical decision making (see chart for details).        Chief complaint near syncope.  EKG reveals atrial fibrillation with some runs of ventricular ectopy.  Will consult cardiology.  Admission. 1330: IV Cardizem initiated.  Discussed with cardiology.  They will consult and probably admit. CRITICAL CARE Performed by: Nat Christen Total critical care time: 30 minutes Critical care time was exclusive of separately billable procedures and treating other patients. Critical care was necessary to treat or prevent imminent or life-threatening deterioration. Critical care was time spent personally by me on the following activities: development of treatment plan with patient and/or surrogate as  well as nursing, discussions with consultants, evaluation of patient's response to treatment, examination of patient, obtaining history from patient or surrogate, ordering and performing treatments and interventions, ordering and review of laboratory studies, ordering and review of radiographic studies, pulse oximetry and re-evaluation of patient's condition.  Final Clinical Impressions(s) / ED Diagnoses   Final diagnoses:  Atrial fibrillation, unspecified type [facility])    ED Discharge Orders    None       Nat Christen, MD 12/10/18 1237    Nat Christen, MD 12/10/18 1332

## 2018-12-10 NOTE — ED Notes (Signed)
Heparin drip verified w/Crystral, RN

## 2018-12-10 NOTE — H&P (Addendum)
Cardiology Admission History and Physical:   Patient ID: Cynthia [name] MRN: [id number]; DOB: 02-17-1945   Admission date: 12/10/2018  Primary Care Provider: [name], MD Primary Cardiologist: New to [place]; Dr. Margaretann Loveless Primary Electrophysiologist:  None   Chief Complaint:  Near syncope and new onset atrial fibrillation  Patient Profile:   Cynthia [name] is a 73 y.o. female with PMH of HTN, DM type 2, hypothyroidism, and breast cancer s/p lumpectomy/XRT in 2018 on anastrozole now, and anxiety who presented via EMS after a visit to her PCP office today where she was found to have new onset atrial fibrillation and c/f VT.  History of Present Illness:   Cynthia [name] was in her usual state of health until about 1 week ago when she experienced a near syncopal event. She reported that she was going to the bathroom at the time and lowered herself to the floor when she felt sudden onset lighteadedness and clamminess. She had a subsequent episode a couple days later when she was walking into her kitchen after sitting on the couch. Again she did not have frank LOC.She presented to her PCP office today for these concerns, as well as c/f UTI. EKG revealed atrial fibrillation which was a new diagnosis, as well as possible runs of VT. EMS was activated and she was brought to [facility] ED for further evaluation.  She has no prior heart disease history and does not follow with a cardiologist. She denies prior echocardiograms or stress tests. She reports family history of CHF in her mother, who also had a PPM. No family history of MI.   At the time of this evaluation she reports feeling fine. She denies any pre-syncopal symptoms today. She denies any complaints of palpitations, racing heart beats, chest pain, SOB, DOE, or loss of consciousness. She has been having increased urinary frequency and hematuria this week, though improved over the last 2 days, concerning for a UTI. No complaints of fever, URI symptoms,  orthopnea, PND, LE edema, snoring, daytime somnolence, nausea, vomiting, diarrhea, hematochezia, or melena.  ED course: intermittently tachycardic to 100s, intermittently tachypneic, otherwise VSS. Labs notable for K 3.6, Cr 0.78, CBC wnl, Trop 6. CXR without acute findings. EKG with atrial fibrillation with RVR, rate 105, paired PVCs, poor R wave progression, no STE/D, QTc 495. She was started on a diltiazem gtt for rate control. Cardiology asked to evaluate for new onset atrial fibrillation.   Heart Pathway Score:     Past Medical History:  Diagnosis Date  . Anxiety   . Arthritis   . Cancer ([place])    breast cance  . DDD (degenerative disc disease), lumbar   . Depression   . Diabetes mellitus without complication ([place])    Takes metformin but has never been told she was diabetic  . Genetic testing 06/25/2016   Ms. Lamountain underwent genetic counseling and testing for hereditary cancer syndromes on 06/17/2016. Her results were negative for mutations in all 46 genes analyzed by [facility]'s 46-gene Common Hereditary Cancers Panel. Genes analyzed include: APC, ATM, AXIN2, BARD1, BMPR1A, BRCA1, BRCA2, BRIP1, CDH1, CDKN2A, CHEK2, CTNNA1, DICER1, EPCAM, GREM1, HOXB13, KIT, MEN1, MLH1, MSH2, MSH3, MSH6, MUTYH, NBN,  . GERD (gastroesophageal reflux disease)    OTC tums  . History of radiation therapy 05/22/2016 to 06/20/2016  . Hypertension   . Hypothyroidism   . IBS (irritable bowel syndrome)   . Insomnia   . Restless leg syndrome   . Thyroid disease     Past Surgical History:  Procedure Laterality Date  . BREAST LUMPECTOMY WITH RADIOACTIVE SEED AND SENTINEL LYMPH NODE BIOPSY Right 04/01/2016   Procedure: RADIOACTIVE SEED GUIDED RIGHT BREAST LUMPECTOMY WITH RIGHT AXILLARY SENTINEL LYMPH NODE BIOPSY.;  Surgeon: Fanny Skates, MD;  Location: [place];  Service: General;  Laterality: Right;  . CATARACT EXTRACTION    . CHOLECYSTECTOMY       Medications Prior to Admission: Prior to Admission medications    Medication Sig Start Date End Date Taking? Authorizing Provider  acetaminophen (TYLENOL) 500 MG tablet Take 500 mg by mouth every 6 (six) hours as needed (for headaches.).    [provider]  ALPRAZolam Duanne Moron) 0.5 MG tablet Take 0.5 mg by mouth at bedtime. 03/12/16   [provider]  anastrozole (ARIMIDEX) 1 MG tablet Take 1 tablet (1 mg total) by mouth daily. 09/22/18   Magrinat, [name], MD  atenolol (TENORMIN) 25 MG tablet Take 25 mg by mouth 2 (two) times daily. 12/28/15   [provider]  Choline Fenofibrate (FENOFIBRIC ACID) 135 MG CPDR Take 135 mg by mouth daily. 01/16/16   [provider]  FERROUS SULFATE PO Take 1 tablet by mouth every evening.    [provider]  imipramine (TOFRANIL) 50 MG tablet Take 50 mg by mouth at bedtime. 01/16/16   [provider]  levothyroxine (SYNTHROID, LEVOTHROID) 112 MCG tablet Take 112 mcg by mouth at bedtime. 03/13/16   [provider]  metFORMIN (GLUCOPHAGE) 500 MG tablet Take 500 mg by mouth 2 (two) times daily. 01/16/16   [provider]  psyllium (REGULOID) 0.52 g capsule Take 2.08 g by mouth daily at 2 PM.    [provider]  traZODone (DESYREL) 150 MG tablet Take 150 mg by mouth at bedtime. 03/01/16   [provider]  triamterene-hydrochlorothiazide (MAXZIDE-25) 37.5-25 MG tablet Take 1 tablet by mouth daily. 01/24/16   [provider]     Allergies:    Allergies  Allergen Reactions  . No Known Allergies     Social History:   Social History   Socioeconomic History  . Marital status: Married    Spouse name: Not on file  . Number of children: Not on file  . Years of education: Not on file  . Highest education level: Not on file  Occupational History  . Not on file  Social Needs  . Financial resource strain: Not on file  . Food insecurity    Worry: Not on file    Inability: Not on file  . Transportation needs    Medical: Not on file     Non-medical: Not on file  Tobacco Use  . Smoking status: Never Smoker  . Smokeless tobacco: Never Used  Substance and Sexual Activity  . Alcohol use: Yes    Comment: occasionally  . Drug use: No  . Sexual activity: Not on file  Lifestyle  . Physical activity    Days per week: Not on file    Minutes per session: Not on file  . Stress: Not on file  Relationships  . Social Herbalist on phone: Not on file    Gets together: Not on file    Attends religious service: Not on file    Active member of club or organization: Not on file    Attends meetings of clubs or organizations: Not on file    Relationship status: Not on file  . Intimate partner violence    Fear of current or ex partner: Not  on file    Emotionally abused: Not on file    Physically abused: Not on file    Forced sexual activity: Not on file  Other Topics Concern  . Not on file  Social History Narrative  . Not on file    Family History:   The patient's family history includes Breast cancer (age of onset: 61) in an other family member; Breast cancer (age of onset: 69) in her sister; Cervical cancer (age of onset: 88) in her sister; Leukemia (age of onset: 55) in her brother; Ovarian cancer (age of onset: 32) in her maternal aunt.    ROS:  Please see the history of present illness.  All other ROS reviewed and negative.     Physical Exam/Data:   Vitals:   12/10/18 1216 12/10/18 1226 12/10/18 1315 12/10/18 1345  BP: (!) 132/98  117/85 113/85  Pulse: 93     Resp: 17  (!) 22 15  Temp: 97.7 F (36.5 C)     TempSrc: Oral     SpO2: 100%     Weight:  73.5 kg    Height:  5' 5.5" (1.664 m)     No intake or output data in the 24 hours ending 12/10/18 1441 Last 3 Weights 12/10/2018 09/22/2018 07/07/2017  Weight (lbs) 162 lb 161 lb 3.2 oz 170 lb  Weight (kg) 73.483 kg 73.12 kg 77.111 kg     Body mass index is 26.55 kg/m.  General:  Well nourished, well developed, in no acute distress; ambulated to the  bathroom with a steady gait  HEENT: sclera anicteric Neck: no JVD Vascular: No carotid bruits; distal pulses 2+ bilaterally  Cardiac:  normal S1, S2; IRIR; no murmurs, rubs, or gallops Lungs:  clear to auscultation bilaterally, no wheezing, rhonchi or rales  Abd: soft, nontender, no hepatomegaly  Ext: no edema Musculoskeletal:  No deformities, BUE and BLE strength normal and equal Skin: warm and dry  Neuro:  CNs 2-12 intact, no focal abnormalities noted Psych:  Normal affect    EKG:  atrial fibrillation with RVR, rate 105, paired PVCs, poor R wave progression, no STE/D, QTc 495.  Relevant CV Studies: None  Laboratory Data:  High Sensitivity Troponin:   Recent Labs  Lab 12/10/18 1222  TROPONINIHS 6      Chemistry Recent Labs  Lab 12/10/18 1222  NA 139  K 3.6  CL 105  CO2 23  GLUCOSE 88  BUN 17  CREATININE 0.78  CALCIUM 9.3  GFRNONAA >60  GFRAA >60  ANIONGAP 11    Recent Labs  Lab 12/10/18 1222  PROT 6.6  ALBUMIN 3.7  AST 21  ALT 15  ALKPHOS 37*  BILITOT 0.4   Hematology Recent Labs  Lab 12/10/18 1222  WBC 7.4  RBC 4.50  HGB 13.7  HCT 42.7  MCV 94.9  MCH 30.4  MCHC 32.1  RDW 13.0  PLT 309   BNPNo results for input(s): BNP, PROBNP in the last 168 hours.  DDimer No results for input(s): DDIMER in the last 168 hours.   Radiology/Studies:  Dg Chest Port 1 View  Result Date: 12/10/2018 CLINICAL DATA:  Syncope. EXAM: PORTABLE CHEST 1 VIEW COMPARISON:  None. FINDINGS: The heart size and mediastinal contours are within normal limits. Both lungs are clear. The visualized skeletal structures are unremarkable. IMPRESSION: No active disease. Electronically Signed   By: Marijo Conception M.D.   On: 12/10/2018 12:44    Assessment and Plan:   1. New onset atrial  fibrillation with frequent aberrancy vs VT: patient presented to PCP office with multiple near syncope events over the past week. She was found to be in atrial fibrillation with RVR and c/f VT and  was transferred via EMS to [facility] ED. Rates have been in the 90s-100s. She was started on a diltiazem gtt for rate control. EMS run sheet reviewed - no clear evidence of VT though does have paired PVCs. Telemetry reviewed with Dr. Caryl Comes with EP and possible she is having VT vs afib with aberrancy - This patients CHA2DS2-VASc Score and unadjusted Ischemic Stroke Rate (% per year) is equal to 7.2 % stroke rate/year from a score of 5 Above score calculated as 1 point each if present [CHF, HTN, DM, Vascular=MI/PAD/Aortic Plaque, Age if 65-74, or Female] Above score calculated as 2 points each if present [Age > 75, or Stroke/TIA/TE] - Will start a heparin gtt for stroke ppx - Will check an echocardiogram to evaluate LV and valvular function - Will add on TSH - Will replete K - 40 mEq now - Will add on Mg level - Will stop diltiazem at this time given c/f VT - Will start metoprolol tartrate 28m BID for rate control - Will start an amiodarone gtt for rhythm control - Dr. KCaryl Comesto round on patient tomorrow  2. Presyncope: in addition to above arrhythmias, she has also had 2 significant pauses on telemetry, one post-conversion pause ~3.5 seconds, and one atrial fibrillation pause of 5.6 seconds. Likely this contributed to her presentation  - Will continue to monitor on telemetry and cautiously start metoprolol 245mBID for rate control for management of #1 - Dr. KlCaryl Comeso round on patient tomorrow - anticipate PPM discussion  3. HTN: on atenolol 2561mID and triamterene-HCTZ 37.5-25mg daily at home.  - Hold home medications - Managed in the context of #1 for now  4. DM type 2: on metformin at home - Will check HgbA1C - Will maintain on ISS while admitted  5. Hypothyroidism:  - Will check TSH - Continue home levothyroxine  6. Anxiety:  - Continue home xanax, imipramine, and trazodone  7. Urinary frequency/hematuria: patient says this is consistent with UTI. Symptoms generally improved over the  past 2 days. No fevers or white count - Will check UA/UCx - Monitor for recurrent hematuria with starting a heparin gtt for management of #1   Severity of Illness: The appropriate patient status for this patient is INPATIENT. Inpatient status is judged to be reasonable and necessary in order to provide the required intensity of service to ensure the patient's safety. The patient's presenting symptoms, physical exam findings, and initial radiographic and laboratory data in the context of their chronic comorbidities is felt to place them at high risk for further clinical deterioration. Furthermore, it is not anticipated that the patient will be medically stable for discharge from the hospital within 2 midnights of admission. The following factors support the patient status of inpatient.   " The patient's presenting symptoms include near syncope. " The worrisome physical exam findings include IRIR on cardiac exam. " The initial radiographic and laboratory data are worrisome because of atrial fibrillation with possible VT on telemetry. " The chronic co-morbidities include HTN, DM type 2, hypothyroidism, anxiety.   * I certify that at the point of admission it is my clinical judgment that the patient will require inpatient hospital care spanning beyond 2 midnights from the point of admission due to high intensity of service, high risk for further deterioration  and high frequency of surveillance required.*    For questions or updates, please contact [place] Please consult [email or web address] for contact info under        Signed, Abigail Butts, PA-C  12/10/2018 2:41 PM  ---------------------------------------------------------------------------------------------   History and all data above reviewed.  Patient examined.  I agree with the findings as above.  Cynthia [name] is a pleasant 73 yo female on whom we were consulted for presyncope and afib. She demonstrates afib with rates between  80-110 bpm. She notes two episodes of presyncope, and denies lifetime syncope. No chest pain or shortness of breath.   Constitutional: No acute distress Eyes: pupils equally round and reactive to light, sclera non-icteric, normal conjunctiva and lids ENMT: normal dentition, moist mucous membranes Cardiovascular: irregular rhythm, normal rate, no murmurs. S1 and S2 normal. Radial pulses normal bilaterally. No jugular venous distention.  Respiratory: clear to auscultation bilaterally GI : normal bowel sounds, soft and nontender. No distention.   MSK: extremities warm, well perfused. No edema.  NEURO: grossly nonfocal exam, moves all extremities. PSYCH: alert and oriented x 3, normal mood and affect.   All available labs, radiology testing, previous records reviewed. Agree with documented assessment and plan of my colleague as stated above with the following additions or changes:  Principal Problem:   New onset atrial fibrillation ([place]) Active Problems:   Atrial fibrillation with RVR ([place])    Plan: she has atrial fibrillation which is newly diagnosed since visiting her PCP today. She also has what appear to be aberrantly conducted beats vs. Fascicular ventricular tachycardia. We do not have a sense of her LV function, and fortunately she does not clinically have heart failure. We will obtain an echo and will evaluate if calcium channel blockers would be appropriate.   AV nodal blocking agents maybe challenging to use given that she has demonstrated a conversion pause as well as >5 second pauses in afib. This is likely the source of her presyncope. I have asked for assistance from my colleagues in [place], and Dr. Caryl Comes will plan to see the patient tomorrow and help determine if pacing would be indicated.   The patient and I briefly discussed TEE cardioversion however it appears she has paroxysmal Afib and converted spontaneously (with a conversion pause) during her stay in the ED.   At this time we  will plan to use amiodarone for rhythm control, heparin for AC, and metoprolol for rate control.   Length of Stay:  LOS: 0 days   Elouise Munroe, MD [facility] 9:14 PM  12/10/2018

## 2018-12-10 NOTE — ED Notes (Signed)
Report given to Rodman Key, RN on 3E

## 2018-12-10 NOTE — Progress Notes (Signed)
ANTICOAGULATION CONSULT NOTE - Initial Consult  Pharmacy Consult for heparin Indication: atrial fibrillation  Allergies  Allergen Reactions  . No Known Allergies     Patient Measurements: Height: 5' 5.5" (166.4 cm) Weight: 162 lb (73.5 kg) IBW/kg (Calculated) : 58.15 Heparin Dosing Weight: 72.9kg  Vital Signs: Temp: 97.7 F (36.5 C) (12/04 1216) Temp Source: Oral (12/04 1216) BP: 110/82 (12/04 1640) Pulse Rate: 107 (12/04 1640)  Labs: Recent Labs    12/10/18 1222 12/10/18 1445  HGB 13.7  --   HCT 42.7  --   PLT 309  --   CREATININE 0.78  --   TROPONINIHS 6 6    Estimated Creatinine Clearance: 61.7 mL/min (by C-G formula based on SCr of 0.78 mg/dL).   Medical History: Past Medical History:  Diagnosis Date  . Anxiety   . Arthritis   . Cancer ([place])    breast cance  . DDD (degenerative disc disease), lumbar   . Depression   . Diabetes mellitus without complication ([place])    Takes metformin but has never been told she was diabetic  . Genetic testing 06/25/2016   Ms. Podolak underwent genetic counseling and testing for hereditary cancer syndromes on 06/17/2016. Her results were negative for mutations in all 46 genes analyzed by [facility]'s 46-gene Common Hereditary Cancers Panel. Genes analyzed include: APC, ATM, AXIN2, BARD1, BMPR1A, BRCA1, BRCA2, BRIP1, CDH1, CDKN2A, CHEK2, CTNNA1, DICER1, EPCAM, GREM1, HOXB13, KIT, MEN1, MLH1, MSH2, MSH3, MSH6, MUTYH, NBN,  . GERD (gastroesophageal reflux disease)    OTC tums  . History of radiation therapy 05/22/2016 to 06/20/2016  . Hypertension   . Hypothyroidism   . IBS (irritable bowel syndrome)   . Insomnia   . Restless leg syndrome   . Thyroid disease     Medications:  Infusions:  . amiodarone     Followed by  . [START ON 12/11/2018] amiodarone    . heparin      Assessment: 47 yof presented to the ED with new onset afib. To start IV heparin. Baseline CBC is WNL and she is not on anticoagulation PTA.   Goal of  Therapy:  Heparin level 0.3-0.7 units/ml Monitor platelets by anticoagulation protocol: Yes   Plan:  Heparin bolus 4000 units IV x 1 Heparin gtt 1000 units/hr Check an 8 hr heparin level Daily heparin level and CBC  Sharonne Ricketts, Rande Lawman 12/10/2018,6:08 PM

## 2018-12-10 NOTE — Progress Notes (Signed)
Pt successfully transferred self from bedside commode to bed without calling. Educated patient on importance of calling due to syncopal episodes. Will continue to monitor.

## 2018-12-10 NOTE — ED Notes (Signed)
ED Provider at bedside. 

## 2018-12-10 NOTE — ED Notes (Signed)
ED TO INPATIENT HANDOFF REPORT  ED Nurse Name and Phone #: Lorrin Goodell [phone]  S Name/Age/Gender Cynthia [name] 73 y.o. female Room/Bed: 381R/711A  Code Status   Code Status: Full Code  Home/SNF/Other Home Patient oriented to: self, place, time and situation Is this baseline? Yes   Triage Complete: Triage complete  Chief Complaint AFIB,VTACH  Triage Note Pt arrives with [facility] from [place] c/o multiple syncopal episodes over the past 3 weeks, afib, and runs of Vtac with runs of PVCs. Pt denies any CP or SHOB. Pt has hx of BCA; right arm is restricted.  EMS vitals:  113/83 HR 86 afib 100 O2 on RA CBG 119    Allergies Allergies  Allergen Reactions  . No Known Allergies     Level of Care/Admitting Diagnosis ED Disposition    ED Disposition Condition Comment   Admit  Hospital Area: [facility] [[id number]]  Level of Care: Progressive [102]  Admit to Progressive based on following criteria: CARDIOVASCULAR & THORACIC of moderate stability with acute coronary syndrome symptoms/low risk myocardial infarction/hypertensive urgency/arrhythmias/heart failure potentially compromising stability and stable post cardiovascular intervention patients.  Covid Evaluation: Asymptomatic Screening Protocol (No Symptoms)  Diagnosis: Atrial fibrillation with RVR [facility]) [[id number]]  Admitting Physician: Elouise Munroe [[id number]]  Attending Physician: Elouise Munroe [[id number]]  Estimated length of stay: past midnight tomorrow  Certification:: I certify this patient will need inpatient services for at least 2 midnights  PT Class (Do Not Modify): Inpatient [101]  PT Acc Code (Do Not Modify): Private [1]       B Medical/Surgery History Past Medical History:  Diagnosis Date  . Anxiety   . Arthritis   . Cancer ([place])    breast cance  . DDD (degenerative disc disease), lumbar   . Depression   . Diabetes mellitus without complication ([place])    Takes metformin  but has never been told she was diabetic  . Genetic testing 06/25/2016   Ms. Gries underwent genetic counseling and testing for hereditary cancer syndromes on 06/17/2016. Her results were negative for mutations in all 46 genes analyzed by [facility]'s 46-gene Common Hereditary Cancers Panel. Genes analyzed include: APC, ATM, AXIN2, BARD1, BMPR1A, BRCA1, BRCA2, BRIP1, CDH1, CDKN2A, CHEK2, CTNNA1, DICER1, EPCAM, GREM1, HOXB13, KIT, MEN1, MLH1, MSH2, MSH3, MSH6, MUTYH, NBN,  . GERD (gastroesophageal reflux disease)    OTC tums  . History of radiation therapy 05/22/2016 to 06/20/2016  . Hypertension   . Hypothyroidism   . IBS (irritable bowel syndrome)   . Insomnia   . Restless leg syndrome   . Thyroid disease    Past Surgical History:  Procedure Laterality Date  . BREAST LUMPECTOMY WITH RADIOACTIVE SEED AND SENTINEL LYMPH NODE BIOPSY Right 04/01/2016   Procedure: RADIOACTIVE SEED GUIDED RIGHT BREAST LUMPECTOMY WITH RIGHT AXILLARY SENTINEL LYMPH NODE BIOPSY.;  Surgeon: Fanny Skates, MD;  Location: [facility];  Service: General;  Laterality: Right;  . CATARACT EXTRACTION    . CHOLECYSTECTOMY       A IV Location/Drains/Wounds Patient Lines/Drains/Airways Status   Active Line/Drains/Airways    Name:   Placement date:   Placement time:   Site:   Days:   Peripheral IV 12/10/18 Left Hand   12/10/18    1409    Hand   less than 1   Peripheral IV 12/10/18 Left Forearm   12/10/18    1827    Forearm   less than 1   Peripheral IV 12/10/18 Left Antecubital   12/10/18  1828    Antecubital   less than 1   Incision (Closed) 04/01/16 Breast Right   04/01/16    0729     983          Intake/Output Last 24 hours  Intake/Output Summary (Last 24 hours) at 12/10/2018 1952 Last data filed at 12/10/2018 1821 Gross per 24 hour  Intake 25 ml  Output -  Net 25 ml    Labs/Imaging Results for orders placed or performed during the hospital encounter of 12/10/18 (from the past 48 hour(s))  CBC with Differential      Status: None   Collection Time: 12/10/18 12:22 PM  Result Value Ref Range   WBC 7.4 4.0 - 10.5 K/uL   RBC 4.50 3.87 - 5.11 MIL/uL   Hemoglobin 13.7 12.0 - 15.0 g/dL   HCT 42.7 36.0 - 46.0 %   MCV 94.9 80.0 - 100.0 fL   MCH 30.4 26.0 - 34.0 pg   MCHC 32.1 30.0 - 36.0 g/dL   RDW 13.0 11.5 - 15.5 %   Platelets 309 150 - 400 K/uL   nRBC 0.0 0.0 - 0.2 %   Neutrophils Relative % 62 %   Neutro Abs 4.6 1.7 - 7.7 K/uL   Lymphocytes Relative 28 %   Lymphs Abs 2.1 0.7 - 4.0 K/uL   Monocytes Relative 7 %   Monocytes Absolute 0.5 0.1 - 1.0 K/uL   Eosinophils Relative 2 %   Eosinophils Absolute 0.1 0.0 - 0.5 K/uL   Basophils Relative 1 %   Basophils Absolute 0.1 0.0 - 0.1 K/uL   Immature Granulocytes 0 %   Abs Immature Granulocytes 0.02 0.00 - 0.07 K/uL    Comment: Performed at [facility], [address], [place], [place] [address]  Comprehensive metabolic panel     Status: Abnormal   Collection Time: 12/10/18 12:22 PM  Result Value Ref Range   Sodium 139 135 - 145 mmol/L   Potassium 3.6 3.5 - 5.1 mmol/L   Chloride 105 98 - 111 mmol/L   CO2 23 22 - 32 mmol/L   Glucose, Bld 88 70 - 99 mg/dL   BUN 17 8 - 23 mg/dL   Creatinine, Ser 0.78 0.44 - 1.00 mg/dL   Calcium 9.3 8.9 - 10.3 mg/dL   Total Protein 6.6 6.5 - 8.1 g/dL   Albumin 3.7 3.5 - 5.0 g/dL   AST 21 15 - 41 U/L   ALT 15 0 - 44 U/L   Alkaline Phosphatase 37 (L) 38 - 126 U/L   Total Bilirubin 0.4 0.3 - 1.2 mg/dL   GFR calc non Af Amer >60 >60 mL/min   GFR calc Af Amer >60 >60 mL/min   Anion gap 11 5 - 15    Comment: Performed at [facility] [address] [place], [place] [address]  Troponin I (High Sensitivity)     Status: None   Collection Time: 12/10/18 12:22 PM  Result Value Ref Range   Troponin I (High Sensitivity) 6 <18 ng/L    Comment: (NOTE) Elevated high sensitivity troponin I (hsTnI) values and significant  changes across serial measurements may suggest ACS but many other  chronic and acute  conditions are known to elevate hsTnI results.  Refer to the "Links" section for chest pain algorithms and additional  guidance. Performed at [facility], [place] [address], [place], [place] [address]   Troponin I (High Sensitivity)     Status: None   Collection  Time: 12/10/18  2:45 PM  Result Value Ref Range   Troponin I (High Sensitivity) 6 <18 ng/L    Comment: (NOTE) Elevated high sensitivity troponin I (hsTnI) values and significant  changes across serial measurements may suggest ACS but many other  chronic and acute conditions are known to elevate hsTnI results.  Refer to the "Links" section for chest pain algorithms and additional  guidance. Performed at [facility], [place] [address] [place], [place] [address]    Dg Chest Port 1 View  Result Date: 12/10/2018 CLINICAL DATA:  Syncope. EXAM: PORTABLE CHEST 1 VIEW COMPARISON:  None. FINDINGS: The heart size and mediastinal contours are within normal limits. Both lungs are clear. The visualized skeletal structures are unremarkable. IMPRESSION: No active disease. Electronically Signed   By: Marijo Conception M.D.   On: 12/10/2018 12:44    Pending Labs Unresulted Labs (From admission, onward)    Start     Ordered   12/12/18 0500  Heparin level (unfractionated)  Daily,   R     12/10/18 1808   12/11/18 4196  Basic metabolic panel  Tomorrow morning,   R     12/10/18 1851   12/11/18 0500  Lipid panel  Tomorrow morning,   R     12/10/18 1851   12/11/18 0500  Hemoglobin A1c  Tomorrow morning,   R     12/10/18 1851   12/11/18 0500  CBC  Daily,   R     12/10/18 1808   12/11/18 0230  Heparin level (unfractionated)  Once-Timed,   STAT     12/10/18 1808   12/10/18 1852  Hemoglobin A1c  Add-on,   AD    Comments: To assess prior glycemic control    12/10/18 1851   12/10/18 1808  Magnesium  Add-on,   AD     12/10/18 1807   12/10/18 1805  Culture, Urine  Once,   STAT     12/10/18 1805   12/10/18 1805  Urinalysis, Complete w  Microscopic  Once,   STAT     12/10/18 1805   12/10/18 1556  TSH  Add-on,   AD     12/10/18 1555   12/10/18 1234  SARS CORONAVIRUS 2 (TAT 6-24 HRS) Nasopharyngeal Nasopharyngeal Swab  (Asymptomatic/Tier 3)  Once,   STAT    Question Answer Comment  Is this test for diagnosis or screening Screening   Symptomatic for COVID-19 as defined by CDC No   Hospitalized for COVID-19 No   Admitted to ICU for COVID-19 No   Previously tested for COVID-19 No   Resident in a congregate (group) care setting No   Employed in healthcare setting No   Pregnant No      12/10/18 1233          Vitals/Pain Today's Vitals   12/10/18 1800 12/10/18 1830 12/10/18 1845 12/10/18 1900  BP: 104/67   113/76  Pulse: (!) 58 62    Resp: 17 20 18 14   Temp:      TempSrc:      SpO2: 98% 97%    Weight:      Height:        Isolation Precautions No active isolations  Medications Medications  anastrozole (ARIMIDEX) tablet 1 mg (has no administration in time range)  Fenofibric Acid CPDR 135 mg (has no administration in time range)  ALPRAZolam (XANAX) tablet 0.5 mg (has no administration in time range)  imipramine (TOFRANIL) tablet 50 mg (has no administration  in time range)  traZODone (DESYREL) tablet 150 mg (has no administration in time range)  levothyroxine (SYNTHROID) tablet 112 mcg (has no administration in time range)  psyllium (REGULOID) capsule 2.08 g (has no administration in time range)  insulin aspart (novoLOG) injection 0-9 Units (has no administration in time range)  insulin aspart (novoLOG) injection 0-5 Units (has no administration in time range)  acetaminophen (TYLENOL) tablet 650 mg (has no administration in time range)  metoprolol tartrate (LOPRESSOR) tablet 25 mg (has no administration in time range)  amiodarone (NEXTERONE) 1.8 mg/mL load via infusion 150 mg (150 mg Intravenous Bolus from Bag 12/10/18 1832)    Followed by  amiodarone (NEXTERONE PREMIX) 360-4.14 MG/200ML-% (1.8 mg/mL) IV  infusion (60 mg/hr Intravenous New Bag/Given 12/10/18 1848)    Followed by  amiodarone (NEXTERONE PREMIX) 360-4.14 MG/200ML-% (1.8 mg/mL) IV infusion (has no administration in time range)  heparin ADULT infusion 100 units/mL (25000 units/28m sodium chloride 0.45%) (1,000 Units/hr Intravenous New Bag/Given 12/10/18 1830)  diltiazem (CARDIZEM) 1 mg/mL load via infusion 10 mg (10 mg Intravenous Bolus from Bag 12/10/18 1455)  potassium chloride SA (KLOR-CON) CR tablet 40 mEq (40 mEq Oral Given 12/10/18 1856)  heparin bolus via infusion 4,000 Units (4,000 Units Intravenous Bolus from Bag 12/10/18 1831)    Mobility walks Moderate fall risk   Focused Assessments Cardiac Assessment Handoff:  Cardiac Rhythm: Ventricular tachycardia, Atrial fibrillation, Atrial flutter No results found for: CKTOTAL, CKMB, CKMBINDEX, TROPONINI No results found for: DDIMER Does the Patient currently have chest pain? No      R Recommendations: See Admitting Provider Note  Report given to:   Additional Notes:

## 2018-12-10 NOTE — ED Triage Notes (Signed)
Pt arrives with [facility] from [place] c/o multiple syncopal episodes over the past 3 weeks, afib, and runs of Vtac with runs of PVCs. Pt denies any CP or SHOB. Pt has hx of BCA; right arm is restricted.  EMS vitals:  113/83 HR 86 afib 100 O2 on RA CBG 119

## 2018-12-10 NOTE — ED Notes (Signed)
Pt A-fib w runs of v-tach on monitor

## 2018-12-11 ENCOUNTER — Encounter (HOSPITAL_COMMUNITY): Payer: Self-pay

## 2018-12-11 ENCOUNTER — Inpatient Hospital Stay (HOSPITAL_COMMUNITY): Payer: Medicare PPO

## 2018-12-11 DIAGNOSIS — I340 Nonrheumatic mitral (valve) insufficiency: Secondary | ICD-10-CM | POA: Diagnosis not present

## 2018-12-11 DIAGNOSIS — I4891 Unspecified atrial fibrillation: Secondary | ICD-10-CM | POA: Diagnosis not present

## 2018-12-11 HISTORY — PX: CARDIAC CATHETERIZATION: SHX172

## 2018-12-11 LAB — BASIC METABOLIC PANEL
Anion gap: 9 (ref 5–15)
BUN: 17 mg/dL (ref 8–23)
CO2: 24 mmol/L (ref 22–32)
Calcium: 8.8 mg/dL — ABNORMAL LOW (ref 8.9–10.3)
Chloride: 106 mmol/L (ref 98–111)
Creatinine, Ser: 0.82 mg/dL (ref 0.44–1.00)
GFR calc Af Amer: >60 mL/min (ref >60)
GFR calc non Af Amer: >60 mL/min (ref >60)
Glucose, Bld: 102 mg/dL — ABNORMAL HIGH (ref 70–99)
Potassium: 4 mmol/L (ref 3.5–5.1)
Sodium: 139 mmol/L (ref 135–145)

## 2018-12-11 LAB — CBC
HCT: 38.4 % (ref 36.0–46.0)
Hemoglobin: 12.7 g/dL (ref 12.0–15.0)
MCH: 30.8 pg (ref 26.0–34.0)
MCHC: 33.1 g/dL (ref 30.0–36.0)
MCV: 93.2 fL (ref 80.0–100.0)
Platelets: 285 10*3/uL (ref 150–400)
RBC: 4.12 MIL/uL (ref 3.87–5.11)
RDW: 13 % (ref 11.5–15.5)
WBC: 8.1 10*3/uL (ref 4.0–10.5)
nRBC: 0 % (ref 0.0–0.2)

## 2018-12-11 LAB — GLUCOSE, CAPILLARY
Glucose-Capillary: 100 mg/dL — ABNORMAL HIGH (ref 70–99)
Glucose-Capillary: 105 mg/dL — ABNORMAL HIGH (ref 70–99)
Glucose-Capillary: 124 mg/dL — ABNORMAL HIGH (ref 70–99)
Glucose-Capillary: 108 mg/dL — ABNORMAL HIGH (ref 70–99)

## 2018-12-11 LAB — LIPID PANEL
Cholesterol: 135 mg/dL (ref 0–200)
HDL: 48 mg/dL (ref >40)
LDL Cholesterol: 71 mg/dL (ref 0–99)
Total CHOL/HDL Ratio: 2.8 RATIO
Triglycerides: 81 mg/dL (ref <150)
VLDL: 16 mg/dL (ref 0–40)

## 2018-12-11 LAB — HEMOGLOBIN A1C
Hgb A1c MFr Bld: 6.2 % — ABNORMAL HIGH (ref 4.8–5.6)
Mean Plasma Glucose: 131.24 mg/dL

## 2018-12-11 LAB — HEPARIN LEVEL (UNFRACTIONATED)
Heparin Unfractionated: 0.37 IU/mL (ref 0.30–0.70)
Heparin Unfractionated: 0.39 IU/mL (ref 0.30–0.70)

## 2018-12-11 LAB — ECHOCARDIOGRAM COMPLETE
Height: 65.5 in
Weight: 2585.6 oz

## 2018-12-11 MED ORDER — APIXABAN 5 MG PO TABS
5.0000 mg | ORAL_TABLET | Freq: Two times a day (BID) | ORAL | Status: DC
  Administered 2018-12-11 – 2018-12-15 (×9): 5 mg via ORAL
  Filled 2018-12-11 (×9): qty 1

## 2018-12-11 MED ORDER — VERAPAMIL HCL ER 180 MG PO TBCR
180.0000 mg | EXTENDED_RELEASE_TABLET | Freq: Every day | ORAL | Status: DC
  Administered 2018-12-11 – 2018-12-13 (×3): 180 mg via ORAL
  Filled 2018-12-11 (×3): qty 1

## 2018-12-11 NOTE — Progress Notes (Signed)
  Echocardiogram 2D Echocardiogram has been performed.  Burnett Kanaris 12/11/2018, 11:24 AM

## 2018-12-11 NOTE — Progress Notes (Signed)
CCMD calls to report run of VTach, relayed to CN as this RN was discharging a patient. Pt was resting comfortably asymptomatic at the time of the event, talking on her cell phone.

## 2018-12-11 NOTE — Progress Notes (Signed)
[place] for heparin > change to Eliquis Indication: atrial fibrillation  No Known Allergies  Patient Measurements: Height: 5' 5.5" (166.4 cm) Weight: 161 lb 9.6 oz (73.3 kg) IBW/kg (Calculated) : 58.15 Heparin Dosing Weight: 72.9kg  Vital Signs: Temp: 98 F (36.7 C) (12/05 0729) Temp Source: Oral (12/05 0729) BP: 138/74 (12/05 0729) Pulse Rate: 64 (12/05 1035)  Labs: Recent Labs    12/10/18 1222 12/10/18 1445 12/11/18 0205 12/11/18 0931  HGB 13.7  --  12.7  --   HCT 42.7  --  38.4  --   PLT 309  --  285  --   HEPARINUNFRC  --   --  0.37 0.39  CREATININE 0.78  --  0.82  --   TROPONINIHS 6 6  --   --     Estimated Creatinine Clearance: 60.1 mL/min (by C-G formula based on SCr of 0.82 mg/dL).   Medical History: Past Medical History:  Diagnosis Date  . Anxiety   . Arthritis   . Cancer ([place])    breast cance  . DDD (degenerative disc disease), lumbar   . Depression   . Diabetes mellitus without complication ([place])    Takes metformin but has never been told she was diabetic  . Genetic testing 06/25/2016   Ms. Guiles underwent genetic counseling and testing for hereditary cancer syndromes on 06/17/2016. Her results were negative for mutations in all 46 genes analyzed by [facility]'s 46-gene Common Hereditary Cancers Panel. Genes analyzed include: APC, ATM, AXIN2, BARD1, BMPR1A, BRCA1, BRCA2, BRIP1, CDH1, CDKN2A, CHEK2, CTNNA1, DICER1, EPCAM, GREM1, HOXB13, KIT, MEN1, MLH1, MSH2, MSH3, MSH6, MUTYH, NBN,  . GERD (gastroesophageal reflux disease)    OTC tums  . History of radiation therapy 05/22/2016 to 06/20/2016  . Hypertension   . Hypothyroidism   . IBS (irritable bowel syndrome)   . Insomnia   . Restless leg syndrome   . Thyroid disease      Assessment: 1 yof presented to the ED with new onset afib. To start IV heparin. Baseline CBC is WNL and she is not on anticoagulation PTA.   Heparin level this AM within goal range.  No  overt bleeding or complications noted.  Asked by cardiology to switch to Eliquis.  Goal of Therapy:  Heparin level 0.3-0.7 units/ml Monitor platelets by anticoagulation protocol: Yes   Plan:  D/c IV heparin  Start Eliquis 5 mg BID. Will need Eliquis education prior to d/c.  Marguerite Olea, [facility] Clinical Pharmacist Phone [phone]  12/11/2018 1:17 PM

## 2018-12-11 NOTE — Plan of Care (Signed)

## 2018-12-11 NOTE — Progress Notes (Signed)
Progress Note  Patient Name: Cynthia [name] Date of Encounter: 12/11/2018  Primary Cardiologist: No primary care provider on file.    Patient Profile     73 y.o. female admitted 12/4 with episodes of presyncope found on arrival to be in atrial fibrillation with variable rapid response.  Also with wide-complex regular tachycardia.  Started on amiodarone for rate control.  Telemetry demonstrated posttermination pauses of greater than 3 seconds as well as an interval pause of 5.6 seconds during atrial fibrillation.  History of hypertension and diabetes  Thromboembolic risk factors ( age  -2, HTN-1, DM-1,  Gender-1) for a CHADSVASc Score of 4  Echo pending  History of recurrent presyncope.  This has been true over 10+ years; this is associated with a stereotypical prodrome lasting minutes and her recovery phase had lasts tens of minutes.  She also has orthostatic lightheadedness   She was unaware of her atrial fibrillation upon arrival to the emergency room. The patient denies chest pain, shortness of breath, nocturnal dyspnea, orthopnea or peripheral edema.  T    Subjective    without complaints  Inpatient Medications    Scheduled Meds: . ALPRAZolam  0.5 mg Oral QHS  . anastrozole  1 mg Oral Daily  . fenofibrate  54 mg Oral Daily  . imipramine  50 mg Oral QHS  . insulin aspart  0-5 Units Subcutaneous QHS  . insulin aspart  0-9 Units Subcutaneous TID WC  . levothyroxine  112 mcg Oral Q0600  . metoprolol tartrate  25 mg Oral BID  . psyllium  1 packet Oral Q24H  . traZODone  150 mg Oral QHS   Continuous Infusions: . amiodarone 30 mg/hr (12/11/18 1043)  . heparin 1,000 Units/hr (12/10/18 1830)   PRN Meds: acetaminophen, ondansetron (ZOFRAN) IV   Vital Signs    Vitals:   12/11/18 0327 12/11/18 0652 12/11/18 0729 12/11/18 1035  BP:  114/74 138/74   Pulse:   (!) 59 64  Resp:   20   Temp:   98 F (36.7 C)   TempSrc:   Oral   SpO2:   99%   Weight: 73.3 kg      Height:        Intake/Output Summary (Last 24 hours) at 12/11/2018 1152 Last data filed at 12/11/2018 1046 Gross per 24 hour  Intake 1738.39 ml  Output 200 ml  Net 1538.39 ml   Last 3 Weights 12/11/2018 12/10/2018 09/22/2018  Weight (lbs) 161 lb 9.6 oz 162 lb 161 lb 3.2 oz  Weight (kg) 73.301 kg 73.483 kg 73.12 kg      Telemetry    Converted to sinus enroute from ER Some couplets and PVC Further reviewed.  I had asked Dr. Rayann Heman and Lovena Le as to their opinion as to the mechanism of her wide-complex tachycardia.  The former had thought maybe this was atrial flutter with aberration.  Reviewing telemetry, I remain unconvinced of that and suspect that it is indeed nonsustained ventricular tachycardia- Personally Reviewed  ECG    Atrial fibrillation with controlled ventricular response.  Nonsustained wide-complex tachycardia with a right bundle superior axis morphology which is regular.  - Personally Reviewed  Physical Exam    GEN: No acute distress.   Neck: No JVD Cardiac: RRR, no murmurs, rubs, or gallops.  Respiratory: Clear to auscultation bilaterally. GI: Soft, nontender, non-distended  MS: No edema; No deformity. Neuro:  Nonfocal  Psych: Normal affect   Labs    High Sensitivity Troponin:   Recent Labs  Lab 12/10/18 1222 12/10/18 1445  TROPONINIHS 6 6      Chemistry Recent Labs  Lab 12/10/18 1222 12/11/18 0205  NA 139 139  K 3.6 4.0  CL 105 106  CO2 23 24  GLUCOSE 88 102*  BUN 17 17  CREATININE 0.78 0.82  CALCIUM 9.3 8.8*  PROT 6.6  --   ALBUMIN 3.7  --   AST 21  --   ALT 15  --   ALKPHOS 37*  --   BILITOT 0.4  --   GFRNONAA >60 >60  GFRAA >60 >60  ANIONGAP 11 9     Hematology Recent Labs  Lab 12/10/18 1222 12/11/18 0205  WBC 7.4 8.1  RBC 4.50 4.12  HGB 13.7 12.7  HCT 42.7 38.4  MCV 94.9 93.2  MCH 30.4 30.8  MCHC 32.1 33.1  RDW 13.0 13.0  PLT 309 285    BNPNo results for input(s): BNP, PROBNP in the last 168 hours.   DDimer No  results for input(s): DDIMER in the last 168 hours.   Radiology    Dg Chest Port 1 View  Result Date: 12/10/2018 CLINICAL DATA:  Syncope. EXAM: PORTABLE CHEST 1 VIEW COMPARISON:  None. FINDINGS: The heart size and mediastinal contours are within normal limits. Both lungs are clear. The visualized skeletal structures are unremarkable. IMPRESSION: No active disease. Electronically Signed   By: Marijo Conception M.D.   On: 12/10/2018 12:44    Cardiac Studies   Echo pending     Assessment & Plan    Atrial fibrillation with a rapid rate  Presyncope-probably orthostatic  Atrial fibrillation pauses/posttermination pauses  Wide-complex tachycardia-regular   Hypertension   She has paroxysmal atrial fibrillation-course with drug facilitated cardioversion last night during infusion of amiodarone.  She is maintaining sinus rhythm.  She had no associated symptoms.  Thromboembolic risk profile indicate need for anticoagulation.  I have reviewed the risks and benefits of this with her.  Her wide-complex tachycardia, thought by Dr. Rayann Heman to be aberration in the context of flutter as he responded to my query for assistance, remains in my mind still nonsustained ventricular tachycardia based on #1-regularity, #2-the presence of long short intervals more extreme associated with wide-complex rhythms #3-the persistence following conversion to sinus rhythm.  In the context of a normal heart, and the morphology, suggestive of fascicular VT.  We will use verapamil for both this as well as for atrial fibrillation.  I am a little bit concerned about using rate controlling agents like this given the fact that she has posttermination pauses as well as some degree of conduction system disease as suggested by the probable cause in the context of her atrial fibrillation.  However, she has had no clear related bradycardia arrhythmic symptoms.  Her symptoms of presyncope, in the time domain of minutes, are not  consistent with bradycardia arrhythmic events.  With her longstanding hypertension, I suspect that she has orthostatic hypotension; she says these were done yesterday but they are not in epic where they are supposed to be anyway.  We will repeat.    For questions or updates, please contact [facility] Please consult [email or web address] for contact info under        Signed, Virl Axe, MD  12/11/2018, 11:52 AM

## 2018-12-11 NOTE — Progress Notes (Signed)
Patient resting comfortably during shift report. Denies complaints.  

## 2018-12-11 NOTE — Progress Notes (Signed)
CCMD calls stating pt had another 6 beat run of V Tach.  Upon entering the room, pt was resting comfortably, asymptomatic, conversing with her husband. Pt denied any complaints/discomfort, and stated that she actually felt great ambulating around.

## 2018-12-11 NOTE — Progress Notes (Signed)
[place] for heparin Indication: atrial fibrillation  Allergies  Allergen Reactions  . No Known Allergies     Patient Measurements: Height: 5' 5.5" (166.4 cm) Weight: 162 lb (73.5 kg) IBW/kg (Calculated) : 58.15 Heparin Dosing Weight: 72.9kg  Vital Signs: Temp: 98.2 F (36.8 C) (12/05 0011) Temp Source: Oral (12/05 0011) BP: 94/64 (12/05 0011) Pulse Rate: 58 (12/05 0011)  Labs: Recent Labs    12/10/18 1222 12/10/18 1445 12/11/18 0205  HGB 13.7  --  12.7  HCT 42.7  --  38.4  PLT 309  --  285  HEPARINUNFRC  --   --  0.37  CREATININE 0.78  --  0.82  TROPONINIHS 6 6  --     Estimated Creatinine Clearance: 60.2 mL/min (by C-G formula based on SCr of 0.82 mg/dL).   Medical History: Past Medical History:  Diagnosis Date  . Anxiety   . Arthritis   . Cancer ([place])    breast cance  . DDD (degenerative disc disease), lumbar   . Depression   . Diabetes mellitus without complication ([place])    Takes metformin but has never been told she was diabetic  . Genetic testing 06/25/2016   Ms. Lawley underwent genetic counseling and testing for hereditary cancer syndromes on 06/17/2016. Her results were negative for mutations in all 46 genes analyzed by [facility]'s 46-gene Common Hereditary Cancers Panel. Genes analyzed include: APC, ATM, AXIN2, BARD1, BMPR1A, BRCA1, BRCA2, BRIP1, CDH1, CDKN2A, CHEK2, CTNNA1, DICER1, EPCAM, GREM1, HOXB13, KIT, MEN1, MLH1, MSH2, MSH3, MSH6, MUTYH, NBN,  . GERD (gastroesophageal reflux disease)    OTC tums  . History of radiation therapy 05/22/2016 to 06/20/2016  . Hypertension   . Hypothyroidism   . IBS (irritable bowel syndrome)   . Insomnia   . Restless leg syndrome   . Thyroid disease     Medications:  Infusions:  . amiodarone 30 mg/hr (12/11/18 0000)  . heparin 1,000 Units/hr (12/10/18 1830)    Assessment: 32 yof presented to the ED with new onset afib. To start IV heparin. Baseline CBC is WNL and she  is not on anticoagulation PTA.   12/5 AM update:  Initial heparin level therapeutic   Goal of Therapy:  Heparin level 0.3-0.7 units/ml Monitor platelets by anticoagulation protocol: Yes   Plan:  Cont Heparin gtt 1000 units/hr Check an 8 hr heparin level Daily heparin level and CBC  Narda Bonds, PharmD, BCPS Clinical Pharmacist Phone: [phone]

## 2018-12-11 NOTE — Progress Notes (Signed)
   12/11/18 1838  MEWS Assessment  Is this an acute change? No    Vital Signs MEWS/VS Documentation       12/11/2018 0845 12/11/2018 1035 12/11/2018 1531 12/11/2018 1811   MEWS Score:  0  0  0  1   MEWS Score Color:  Green  Green  Green  Green   Resp:  --  --  --  (!) 22   Pulse:  --  64  --  (!) 59   BP:  --  --  133/78  112/69   Temp:  --  --  --  98.2 F (36.8 C)   O2 Device:  --  --  --  Room Air   Level of Consciousness:  Alert  --  --  --            Gladis Riffle 12/11/2018,6:38 PM

## 2018-12-12 DIAGNOSIS — I4891 Unspecified atrial fibrillation: Secondary | ICD-10-CM | POA: Diagnosis not present

## 2018-12-12 LAB — CBC
HCT: 36.8 % (ref 36.0–46.0)
Hemoglobin: 12.1 g/dL (ref 12.0–15.0)
MCH: 30.5 pg (ref 26.0–34.0)
MCHC: 32.9 g/dL (ref 30.0–36.0)
MCV: 92.7 fL (ref 80.0–100.0)
Platelets: 262 10*3/uL (ref 150–400)
RBC: 3.97 MIL/uL (ref 3.87–5.11)
RDW: 13 % (ref 11.5–15.5)
WBC: 6.4 10*3/uL (ref 4.0–10.5)
nRBC: 0 % (ref 0.0–0.2)

## 2018-12-12 LAB — URINE CULTURE: Culture: 20000 — AB

## 2018-12-12 LAB — GLUCOSE, CAPILLARY
Glucose-Capillary: 73 mg/dL (ref 70–99)
Glucose-Capillary: 86 mg/dL (ref 70–99)
Glucose-Capillary: 82 mg/dL (ref 70–99)
Glucose-Capillary: 81 mg/dL (ref 70–99)

## 2018-12-12 MED ORDER — AMIODARONE LOAD VIA INFUSION
150.0000 mg | Freq: Once | INTRAVENOUS | Status: AC
  Administered 2018-12-12: 150 mg via INTRAVENOUS
  Filled 2018-12-12: qty 83.34

## 2018-12-12 MED ORDER — AMIODARONE HCL IN DEXTROSE 360-4.14 MG/200ML-% IV SOLN
60.0000 mg/h | INTRAVENOUS | Status: DC
  Administered 2018-12-12 (×2): 30 mg/h via INTRAVENOUS
  Administered 2018-12-13: 60 mg/h via INTRAVENOUS
  Administered 2018-12-13: 30 mg/h via INTRAVENOUS
  Administered 2018-12-13 – 2018-12-14 (×3): 60 mg/h via INTRAVENOUS
  Filled 2018-12-12 (×5): qty 200

## 2018-12-12 MED ORDER — AMIODARONE HCL IN DEXTROSE 360-4.14 MG/200ML-% IV SOLN
60.0000 mg/h | INTRAVENOUS | Status: DC
  Administered 2018-12-12: 60 mg/h via INTRAVENOUS
  Filled 2018-12-12 (×2): qty 200

## 2018-12-12 NOTE — Plan of Care (Signed)

## 2018-12-12 NOTE — Progress Notes (Signed)
CCMD notified pt is in continuous [place]. Pt is AOx4 with no distress or c/o noted. Pulses equal bilaterally. MD on call paged.

## 2018-12-12 NOTE — Progress Notes (Signed)
CCMD notified pt now in aflutter. Asymptomatic. EKG obtained and confirmed. MD on call paged.

## 2018-12-12 NOTE — Progress Notes (Signed)
Patient Name: Cynthia [name]    Patient Profile     73 y.o. female admitted 12/4 with episodes of presyncope found on arrival to be in atrial fibrillation with variable rapid response.  Also with wide-complex regular tachycardia.  Started on amiodarone for rate control--no awareness of her atrial fibrillation Converted to sinus rhythm.  Less ventricular ectopy. Presyncope events lasting minutes both prodrome and recovery Transition from amiodarone--verapamil Associated with recurrence of ventricular tachycardia no atrial fibrillation.   SUBJECTIVE: Without palpitations chest pain or shortness of breath  Past Medical History:  Diagnosis Date  . Anxiety   . Arthritis   . Cancer ([place])    breast cance  . DDD (degenerative disc disease), lumbar   . Depression   . Diabetes mellitus without complication ([place])    Takes metformin but has never been told she was diabetic  . Genetic testing 06/25/2016   Ms. Pennings underwent genetic counseling and testing for hereditary cancer syndromes on 06/17/2016. Her results were negative for mutations in all 46 genes analyzed by [facility]'s 46-gene Common Hereditary Cancers Panel. Genes analyzed include: APC, ATM, AXIN2, BARD1, BMPR1A, BRCA1, BRCA2, BRIP1, CDH1, CDKN2A, CHEK2, CTNNA1, DICER1, EPCAM, GREM1, HOXB13, KIT, MEN1, MLH1, MSH2, MSH3, MSH6, MUTYH, NBN,  . GERD (gastroesophageal reflux disease)    OTC tums  . History of radiation therapy 05/22/2016 to 06/20/2016  . Hypertension   . Hypothyroidism   . IBS (irritable bowel syndrome)   . Insomnia   . Restless leg syndrome   . Thyroid disease     Scheduled Meds:  Scheduled Meds: . ALPRAZolam  0.5 mg Oral QHS  . anastrozole  1 mg Oral Daily  . apixaban  5 mg Oral BID  . fenofibrate  54 mg Oral Daily  . imipramine  50 mg Oral QHS  . insulin aspart  0-5 Units Subcutaneous QHS  . insulin aspart  0-9 Units Subcutaneous TID WC  . levothyroxine  112 mcg Oral Q0600  . psyllium  1 packet  Oral Q24H  . traZODone  150 mg Oral QHS  . verapamil  180 mg Oral Daily   Continuous Infusions: . amiodarone 60 mg/hr (12/12/18 0936)   Followed by  . amiodarone     acetaminophen, ondansetron (ZOFRAN) IV    PHYSICAL EXAM Vitals:   12/11/18 2130 12/12/18 0007 12/12/18 0338 12/12/18 0800  BP: 110/65 (!) 105/57 102/74   Pulse: 62 71 81   Resp: 18 18 18 18   Temp: 98.4 F (36.9 C) 98.3 F (36.8 C) 97.8 F (36.6 C) 97.9 F (36.6 C)  TempSrc: Oral Oral Oral   SpO2: 94% 94% 92%   Weight:  74.1 kg    Height:        Well developed and nourished in no acute distress HENT normal Neck supple with JVP-  flat   Clear Regular rate and rhythm, no murmurs or gallops Abd-soft with active BS No Clubbing cyanosis edema Skin-warm and dry A & Oriented  Grossly normal sensory and motor function     TELEMETRY: Reviewed personnally pt in sinus rhythm reverted to atrial fibrillation Also recurrent runs of nonsustained ventricular tachycardia/sustained ventricular tachycardia  ECG personally reviewed    Intake/Output Summary (Last 24 hours) at 12/12/2018 1138 Last data filed at 12/12/2018 0912 Gross per 24 hour  Intake 921.72 ml  Output 1600 ml  Net -678.28 ml    LABS: Basic Metabolic Panel: Recent Labs  Lab 12/10/18 1222 12/10/18 2057 12/11/18 0205  NA 139  --  139  K 3.6  --  4.0  CL 105  --  106  CO2 23  --  24  GLUCOSE 88  --  102*  BUN 17  --  17  CREATININE 0.78  --  0.82  CALCIUM 9.3  --  8.8*  MG  --  1.7  --    Cardiac Enzymes: No results for input(s): CKTOTAL, CKMB, CKMBINDEX, TROPONINI in the last 72 hours. CBC: Recent Labs  Lab 12/10/18 1222 12/11/18 0205 12/12/18 0527  WBC 7.4 8.1 6.4  NEUTROABS 4.6  --   --   HGB 13.7 12.7 12.1  HCT 42.7 38.4 36.8  MCV 94.9 93.2 92.7  PLT 309 285 262   PROTIME: No results for input(s): LABPROT, INR in the last 72 hours. Liver Function Tests: Recent Labs    12/10/18 1222  AST 21  ALT 15  ALKPHOS 37*   BILITOT 0.4  PROT 6.6  ALBUMIN 3.7   No results for input(s): LIPASE, AMYLASE in the last 72 hours. BNP: BNP (last 3 results) No results for input(s): BNP in the last 8760 hours.  ProBNP (last 3 results) No results for input(s): PROBNP in the last 8760 hours.  D-Dimer: No results for input(s): DDIMER in the last 72 hours. Hemoglobin A1C: Recent Labs    12/11/18 0205  HGBA1C 6.2*   Fasting Lipid Panel: Recent Labs    12/11/18 0205  CHOL 135  HDL 48  LDLCALC 71  TRIG 81  CHOLHDL 2.8   Thyroid Function Tests: Recent Labs    12/10/18 2057  TSH 0.895   Anemia Panel: No results for input(s): VITAMINB12, FOLATE, FERRITIN, TIBC, IRON, RETICCTPCT in the last 72 hours.       ASSESSMENT AND PLAN:  Atrial fibrillation with a rapid rate  Presyncope-probably orthostatic  Atrial fibrillation pauses/posttermination pauses  Ventricular tachycardia-sustained and nonsustained  Hypertension  Breast cancer  Patient has had recurrent atrial fibrillation as well as recurrent ventricular tachycardia following attempted transition to rate control and rhythm control with verapamil (based on right bundle superior axis possible fascicular VT)  Back on amiodarone.  We will use it for the short-term.  cMRI in the morning to look for structural heart disease.  This may inform alternative antiarrhythmic options  She may also need CTA to exclude coronary disease but we want to try to use a 1C agent  She is caring for her husband at home who recently had knee replacement surgery.  She is been his caregiver feels quite anxious about being hospitalized and at home alone.  Also has significant anxiety related to her MRI.   Signed, Virl Axe MD  12/12/2018

## 2018-12-12 NOTE — Progress Notes (Signed)
MEDICATION RELATED Communication  In reviewing Cynthia [name]'s medication list, she is taking imipramine, and per patient report has been taking over 30+ years for insomina.  This medication has been associated with EKG abnormalities and orthostatic hypotension (frequency not defined).  Although patient has been taking a relatively low dose for a long time, perhaps age-related changes have resulted in poor clearance of this drug?    I have spoken to patient, and she is willing to try to taper drug if advised by MD.  I cautioned her that imipramine must be tapered very slowly (usually over the course of 4-6 months).    MD - Consider beginning taper of imipramine?  Please contact if questions.  Thank you!  [place] Crane, Roylene Reason, BCCP Clinical Pharmacist Phone [phone]  12/12/2018 8:35 AM   FYI -   From [employer]:   Electrocardiogram abnormal a) Summary  1) ECG effects reported in patients receiving therapeutic doses of tricyclic antidepressants include increased heart rate, prolonged PR interval, intraventricular conduction delays, increased corrected QT interval, and flattened T waves [110].  b) In one comparative study, phenelzine and mianserin were less likely than imipramine or amitriptyline to produce changes in cardiac conduction, in patients without cardiac disease. Prolongation of the PR interval was observed with imipramine and amitriptyline but not with phenelzine or mianserin. There was a trend towards prolongation of the QRS complex with the tricyclics, as well as the QT interval; no QRS changes were observed with phenelzine or mianserin and there was a trend for phenelzine to decrease the QT interval, whereas mianserin produced no effect on the QTc interval. These data suggest that mianserin and phenelzine are less likely than imipramine or amitriptyline to produce heart block in patients with cardiac disease; mianserin appeared to be the least likely of the four agents to  induce cardiac conduction abnormalities [109]. c) Forty-four depressed patients receiving imipramine 3.5 mg/kg/day for 4 weeks demonstrated prolonged PR, QRS, and QTc intervals, lowered T-wave amplitude, and increased heart rate compared to a 2 week drug-free period. None of these patients developed severe intraventricular conduction abnormalities nor high-grade AV block [112]. d) Imipramine therapy in the elderly has been associated with increased heart rate and isolated ECG complications (shortening of the QT interval, changes in ST segments, and T waves) [113]. Two elderly, depressed patients, with preexisting cardiac arrhythmias, had significant increases in PR interval, QRS segment, and QT interval following treatment with oral imipramine 3.5 mg/kg/day. Both patients also had a reduction in atrial and ventricular premature depolarizations during therapy [114].  Hypotension a) Summary  1) Orthostatic hypotension has been reported in patients receiving imipramine [133][134][135]. Elderly depressed patients (n=45) may be at an increased risk for orthostatic hypotension if they have preexisting severe heart disease, impaired left ventricular function, or are taking concurrent cardiovascular medications. Another factor, that requires further assessment, was increased forearm resistance. Individuals with increased forearm resistance had a greater frequency of imipramine-induced orthostatic hypotension than those with normal or low forearm resistance. Patients were receiving therapeutic doses of imipramine [133].  b) A 19-year-old white female treated with imipramine 25 mg twice daily for 9 days developed postural hypotension. On the fifth day of therapy, the child developed decreased appetite, dry mouth, and constipation. During the sixth to ninth day of therapy, she developed weakness, and dizziness upon standing, pallor, diaphoresis, vomiting when propped up, and a rapid heart rate. Physical exam revealed a  supine pulse rate of 120 beats/min with supine blood pressure of 100/70 mmHg. Sitting  blood pressure was noted to fall to 80/50 mmHg and pulse rate increased to 170 beats /min, and the patient also became nauseated and sweated profusely. Upon standing, blood pressure dropped to 60/0 mmHg with pulse increasing to greater than 220 beats/min in association with a syncopal attack. An ECG revealed a first degree AV block. Discontinuation of imipramine resulted in improvement of the patient's condition over the 7 day hospital course with subsequent normalization of ECG [134]. c) A study of 44 depressed adults receiving therapeutic doses of imipramine (average 245 mg/day in males, 218 mg/day in females) demonstrated a significant decrease in blood pressure upon standing; average decrease during imipramine therapy was 26.1 mmHg during therapy and 10.9 mmHg prior to therapy. This decrease was independent of age, plasma level or preexisting heart rate [135]. d) 15 elderly patients (mean age 35.6) were initially treated with 25 mg/day imipramine with dose being increased to 150 mg/day [136]. Cardiovascular function was assessed 3 times during the course of the study (pretreatment, day 7 and day 28). On day 7 and day 28 the patient exhibited a significant orthostatic change in diastolic blood pressure and increase in heart rate compared to pretreatment. e) The cardiovascular effects of imipramine, doxepin, and placebo were compared in 24 depressed patients with heart disease. The tricyclic antidepressants had no effects on left ventricular ejection fraction but did cause orthostatic changes in blood pressure. The imipramine therapy was associated with a reduction in premature ventricular contractions, which was not consistently seen in the placebo and doxepin treated patients. Based on the results of this study it would appear that depressed patients with preexisting heart disease, without any severe impairment of  myocardial performance, can be treated with imipramine or doxepin without an adverse effect on ventricular rhythm or hemodynamic function [137].

## 2018-12-12 NOTE — Progress Notes (Addendum)
CCMD notified of 22 beat run of vtach. Pt was resting with eyes closed and no c/o voiced. MD on call paged.

## 2018-12-13 ENCOUNTER — Inpatient Hospital Stay (HOSPITAL_COMMUNITY): Payer: Medicare PPO

## 2018-12-13 DIAGNOSIS — I4891 Unspecified atrial fibrillation: Secondary | ICD-10-CM

## 2018-12-13 LAB — CBC
HCT: 34.9 % — ABNORMAL LOW (ref 36.0–46.0)
Hemoglobin: 11.4 g/dL — ABNORMAL LOW (ref 12.0–15.0)
MCH: 30.9 pg (ref 26.0–34.0)
MCHC: 32.7 g/dL (ref 30.0–36.0)
MCV: 94.6 fL (ref 80.0–100.0)
Platelets: 271 10*3/uL (ref 150–400)
RBC: 3.69 MIL/uL — ABNORMAL LOW (ref 3.87–5.11)
RDW: 13.2 % (ref 11.5–15.5)
WBC: 7.7 10*3/uL (ref 4.0–10.5)
nRBC: 0 % (ref 0.0–0.2)

## 2018-12-13 LAB — GLUCOSE, CAPILLARY
Glucose-Capillary: 95 mg/dL (ref 70–99)
Glucose-Capillary: 95 mg/dL (ref 70–99)
Glucose-Capillary: 110 mg/dL — ABNORMAL HIGH (ref 70–99)
Glucose-Capillary: 95 mg/dL (ref 70–99)

## 2018-12-13 MED ORDER — GADOBUTROL 1 MMOL/ML IV SOLN
8.0000 mL | Freq: Once | INTRAVENOUS | Status: AC | PRN
  Administered 2018-12-13: 8 mL via INTRAVENOUS

## 2018-12-13 MED ORDER — DIAZEPAM 5 MG PO TABS
5.0000 mg | ORAL_TABLET | Freq: Once | ORAL | Status: AC
  Administered 2018-12-13: 5 mg via ORAL
  Filled 2018-12-13: qty 1

## 2018-12-13 MED ORDER — AMIODARONE IV BOLUS ONLY 150 MG/100ML
150.0000 mg | Freq: Once | INTRAVENOUS | Status: AC
  Administered 2018-12-13: 150 mg via INTRAVENOUS
  Filled 2018-12-13: qty 100

## 2018-12-13 MED ORDER — ALPRAZOLAM 0.5 MG PO TABS
0.5000 mg | ORAL_TABLET | Freq: Every day | ORAL | Status: DC
  Administered 2018-12-13: 0.5 mg via ORAL
  Administered 2018-12-14: 1 mg via ORAL
  Filled 2018-12-13: qty 1
  Filled 2018-12-13: qty 2

## 2018-12-13 MED ORDER — METOPROLOL SUCCINATE ER 50 MG PO TB24
50.0000 mg | ORAL_TABLET | Freq: Every day | ORAL | Status: DC
  Administered 2018-12-13 – 2018-12-15 (×3): 50 mg via ORAL
  Filled 2018-12-13 (×3): qty 1

## 2018-12-13 NOTE — Progress Notes (Addendum)
Pt noted to be sustaining HR less than 65bpm in SB/1st degree. Notified cardiology on call due to amio IV infusing @33ml /hr. MD stated to continue at current rate unless HR sustains less than 50bpm, then decrease to 57ml/hr. Pt resting in bed with no c/o voiced.

## 2018-12-13 NOTE — Plan of Care (Signed)

## 2018-12-13 NOTE — Progress Notes (Signed)
CCMD notified of pt going in and out of SR/afib/aflutter with rate 90-102 bpm. Also stated pt had 8 beat run of vtach. Pt is resting in bed with no c/o voiced at this time. MD on call paged.

## 2018-12-13 NOTE — Progress Notes (Addendum)
Progress Note  Patient Name: Cynthia [name] Date of Encounter: 12/13/2018  Primary Cardiologist: Elouise Munroe, MD   Subjective   Anxious about MRI, mentions her husband has had several and some "didn't gi well" and this has her worried, no overt cardiac awareness, no CP, no SOB.  Unaware she had gone back in AF.  No near syncope or syncope  Inpatient Medications    Scheduled Meds: . ALPRAZolam  0.5 mg Oral QHS  . anastrozole  1 mg Oral Daily  . apixaban  5 mg Oral BID  . diazepam  5 mg Oral Once  . fenofibrate  54 mg Oral Daily  . imipramine  50 mg Oral QHS  . insulin aspart  0-5 Units Subcutaneous QHS  . insulin aspart  0-9 Units Subcutaneous TID WC  . levothyroxine  112 mcg Oral Q0600  . metoprolol succinate  50 mg Oral Daily  . psyllium  1 packet Oral Q24H  . traZODone  150 mg Oral QHS   Continuous Infusions: . amiodarone 60 mg/hr (12/13/18 1005)  . amiodarone     PRN Meds: acetaminophen, ondansetron (ZOFRAN) IV   Vital Signs    Vitals:   12/13/18 0043 12/13/18 0401 12/13/18 0814 12/13/18 0840  BP:  121/70 122/89   Pulse:  66 (!) 113 89  Resp:  18 19   Temp:  98.1 F (36.7 C) 98.4 F (36.9 C)   TempSrc:  Oral Oral   SpO2:  94% 97%   Weight: 75.3 kg     Height:        Intake/Output Summary (Last 24 hours) at 12/13/2018 1010 Last data filed at 12/13/2018 0840 Gross per 24 hour  Intake 1161 ml  Output 1375 ml  Net -214 ml   Last 3 Weights 12/13/2018 12/12/2018 12/11/2018  Weight (lbs) 165 lb 14.4 oz 163 lb 6.4 oz 161 lb 9.6 oz  Weight (kg) 75.252 kg 74.118 kg 73.301 kg      Telemetry    Back in AFib (coarse and fine), NSVT episodes intermittently more frequent and longer then other times  - Personally Reviewed  ECG    No new EKSs - Personally Reviewed  Physical Exam   GEN: No acute distress.   Neck: No JVD Cardiac: irreg-irreg, no murmurs, rubs, or gallops.  Respiratory: CTA b/l. GI: Soft, nontender, non-distended  MS: No edema; No  deformity. Neuro:  Nonfocal  Psych: Normal affect   Labs    High Sensitivity Troponin:   Recent Labs  Lab 12/10/18 1222 12/10/18 1445  TROPONINIHS 6 6      Chemistry Recent Labs  Lab 12/10/18 1222 12/11/18 0205  NA 139 139  K 3.6 4.0  CL 105 106  CO2 23 24  GLUCOSE 88 102*  BUN 17 17  CREATININE 0.78 0.82  CALCIUM 9.3 8.8*  PROT 6.6  --   ALBUMIN 3.7  --   AST 21  --   ALT 15  --   ALKPHOS 37*  --   BILITOT 0.4  --   GFRNONAA >60 >60  GFRAA >60 >60  ANIONGAP 11 9     Hematology Recent Labs  Lab 12/11/18 0205 12/12/18 0527 12/13/18 0449  WBC 8.1 6.4 7.7  RBC 4.12 3.97 3.69*  HGB 12.7 12.1 11.4*  HCT 38.4 36.8 34.9*  MCV 93.2 92.7 94.6  MCH 30.8 30.5 30.9  MCHC 33.1 32.9 32.7  RDW 13.0 13.0 13.2  PLT 285 262 271    BNPNo results for  input(s): BNP, PROBNP in the last 168 hours.   DDimer No results for input(s): DDIMER in the last 168 hours.   Radiology    No results found.  Cardiac Studies   12/11/2018 TTE IMPRESSIONS  1. Left ventricular ejection fraction, by visual estimation, is 50 to 55%. The left ventricle has normal function. There is no left ventricular hypertrophy.  2. Global right ventricle has normal systolic function.The right ventricular size is normal. No increase in right ventricular wall thickness.  3. Left atrial size was normal.  4. Right atrial size was normal.  5. Mild mitral valve prolapse.  6. The mitral valve is normal in structure. Mild mitral valve regurgitation. No evidence of mitral stenosis.  7. The tricuspid valve is normal in structure. Tricuspid valve regurgitation is trivial.  8. The aortic valve is normal in structure. Aortic valve regurgitation is trivial. Mild aortic valve sclerosis without stenosis.  9. The pulmonic valve was normal in structure. Pulmonic valve regurgitation is trivial. 10. TR signal is inadequate for assessing pulmonary artery systolic pressure. 11. The inferior vena cava is normal in size  with greater than 50% respiratory variability, suggesting right atrial pressure of 3 mmHg.  Patient Profile     73 y.o. female HTN, DM, hypothyroidism, breast cancer s/p lumpectomy/XRT in 2018 on anastrozole now, and anxiety  Admitted with a week of recurrent near syncope, at her PMD noted to be in AFib with NSVT as well and sent to the hospital, also found in AFib w/RVR and having periods of WCT.  She was also observed to have a post termination pause and a pause maintaining SR  Assessment & Plan    1. New onset AFib w/RVR      CHA2DS2Vasc is 4, started on Eliquis here     She was in SR until last night/this AM remains in AFin 110's  2. NSVT (slower 130's)     Initially suspected to be AF w/abberency, though had more WCT in the setting of SR and is VT     LVEF normal     Pending c.MRI       NSVT possibly the source of her near syncope by her description Re-bolus her amiodarone 150mg  now and increase gtt rate to 60mg /hr afterwards Change verapamil to Toprol Watch for brady events, tolerated the verapamil  Hopefully she will convert back to SR and be able to get her MRI done today (I have written for on call valium for her)  Appreciate pharmacy help Noted yesterday from [facility] "In reviewing Ms. Prowell's medication list, she is taking imipramine, and per patient report has been taking over 30+ years for insomina.  This medication has been associated with EKG abnormalities and orthostatic hypotension (frequency not defined).  Although patient has been taking a relatively low dose for a long time, perhaps age-related changes have resulted in poor clearance of this drug?   I have spoken to patient, and she is willing to try to taper drug if advised by MD.  I cautioned her that imipramine must be tapered very slowly (usually over the course of 4-6 months). "  Will likely defer this to her PMD, though will make Dr. Caryl Comes aware    3. HTN     Changing to metoprolol Succ     Follow  4.  Hypothyroid     TSH was wnl, continue home med  5. DM     Has SSI     For questions or updates, please contact [facility] Please consult [email or web address] for contact info under        Signed, Baldwin Jamaica, PA-C  12/13/2018, 10:10 AM    As above  It is interesting that the burden of her VT is greater in Afib than sinus--will have colleagues review, but I am reminded that fascicular VTs in the lab are often initiated with atrial pacing   Await MRI

## 2018-12-14 DIAGNOSIS — I4891 Unspecified atrial fibrillation: Secondary | ICD-10-CM | POA: Diagnosis not present

## 2018-12-14 LAB — GLUCOSE, CAPILLARY
Glucose-Capillary: 83 mg/dL (ref 70–99)
Glucose-Capillary: 78 mg/dL (ref 70–99)
Glucose-Capillary: 105 mg/dL — ABNORMAL HIGH (ref 70–99)
Glucose-Capillary: 75 mg/dL (ref 70–99)

## 2018-12-14 LAB — CBC
HCT: 40.6 % (ref 36.0–46.0)
Hemoglobin: 12.9 g/dL (ref 12.0–15.0)
MCH: 30.5 pg (ref 26.0–34.0)
MCHC: 31.8 g/dL (ref 30.0–36.0)
MCV: 96 fL (ref 80.0–100.0)
Platelets: 289 10*3/uL (ref 150–400)
RBC: 4.23 MIL/uL (ref 3.87–5.11)
RDW: 13.5 % (ref 11.5–15.5)
WBC: 9.5 10*3/uL (ref 4.0–10.5)
nRBC: 0 % (ref 0.0–0.2)

## 2018-12-14 MED ORDER — AMIODARONE HCL 200 MG PO TABS
400.0000 mg | ORAL_TABLET | Freq: Two times a day (BID) | ORAL | Status: DC
  Administered 2018-12-14 – 2018-12-15 (×3): 400 mg via ORAL
  Filled 2018-12-14 (×3): qty 2

## 2018-12-14 MED ORDER — ALPRAZOLAM 0.5 MG PO TABS: 0.5000 mg | ORAL_TABLET | Freq: Every day | ORAL | Status: DC | PRN

## 2018-12-14 NOTE — Plan of Care (Signed)

## 2018-12-14 NOTE — Care Management Important Message (Signed)
Important Message  Patient Details  Name: Kinslea Ewers MRN: [id number] Date of Birth: 05-14-1945   Medicare Important Message Given:  Yes     Shelda Altes 12/14/2018, 12:26 PM

## 2018-12-14 NOTE — Progress Notes (Signed)
Patient's daughter currently at bedside. Nurse updated daughter on plan of care. All questions and concerns answered. Patient currently lying in bed. Call bell within reach. Bed lowest position.

## 2018-12-14 NOTE — Progress Notes (Addendum)
Progress Note  Patient Name: Cynthia [name] Date of Encounter: 12/14/2018  Primary Cardiologist: Elouise Munroe, MD   Subjective   Anxious, sleeping poorly here, otherwise feels well.  No cardiac awareness  Inpatient Medications    Scheduled Meds: . ALPRAZolam  0.5-1 mg Oral QHS  . amiodarone  400 mg Oral BID  . anastrozole  1 mg Oral Daily  . apixaban  5 mg Oral BID  . fenofibrate  54 mg Oral Daily  . imipramine  50 mg Oral QHS  . insulin aspart  0-5 Units Subcutaneous QHS  . insulin aspart  0-9 Units Subcutaneous TID WC  . levothyroxine  112 mcg Oral Q0600  . metoprolol succinate  50 mg Oral Daily  . psyllium  1 packet Oral Q24H  . traZODone  150 mg Oral QHS   Continuous Infusions:  PRN Meds: acetaminophen, ondansetron (ZOFRAN) IV   Vital Signs    Vitals:   12/14/18 0021 12/14/18 0357 12/14/18 0403 12/14/18 0726  BP: 121/70 133/76  123/84  Pulse: 60 61  (!) 55  Resp: 18 18  20   Temp: 97.9 F (36.6 C) 97.8 F (36.6 C)  98 F (36.7 C)  TempSrc: Oral Oral  Oral  SpO2: 94% 96%  96%  Weight:   75.8 kg   Height:        Intake/Output Summary (Last 24 hours) at 12/14/2018 0826 Last data filed at 12/14/2018 0404 Gross per 24 hour  Intake 1860.14 ml  Output 600 ml  Net 1260.14 ml   Last 3 Weights 12/14/2018 12/13/2018 12/12/2018  Weight (lbs) 167 lb 165 lb 14.4 oz 163 lb 6.4 oz  Weight (kg) 75.751 kg 75.252 kg 74.118 kg      Telemetry    SR/SB generally 60's with /PAfib/caorse Afib rate controlled 70's, no NSVT since yesterday AM /  No pauses (post termination or otherwise), no significant bradycardia - Personally Reviewed  ECG    SR, 61bpm, QT stable - Personally Reviewed  Physical Exam   GEN: No acute distress.   Neck: No JVD Cardiac: irreg-irreg, no murmurs, rubs, or gallops.  Respiratory: CTA b/l. GI: Soft, nontender, non-distended  MS: No edema; No deformity. Neuro:  Nonfocal  Psych: Normal affect   Labs    High Sensitivity Troponin:    Recent Labs  Lab 12/10/18 1222 12/10/18 1445  TROPONINIHS 6 6      Chemistry Recent Labs  Lab 12/10/18 1222 12/11/18 0205  NA 139 139  K 3.6 4.0  CL 105 106  CO2 23 24  GLUCOSE 88 102*  BUN 17 17  CREATININE 0.78 0.82  CALCIUM 9.3 8.8*  PROT 6.6  --   ALBUMIN 3.7  --   AST 21  --   ALT 15  --   ALKPHOS 37*  --   BILITOT 0.4  --   GFRNONAA >60 >60  GFRAA >60 >60  ANIONGAP 11 9     Hematology Recent Labs  Lab 12/12/18 0527 12/13/18 0449 12/14/18 0421  WBC 6.4 7.7 9.5  RBC 3.97 3.69* 4.23  HGB 12.1 11.4* 12.9  HCT 36.8 34.9* 40.6  MCV 92.7 94.6 96.0  MCH 30.5 30.9 30.5  MCHC 32.9 32.7 31.8  RDW 13.0 13.2 13.5  PLT 262 271 289    BNPNo results for input(s): BNP, PROBNP in the last 168 hours.   DDimer No results for input(s): DDIMER in the last 168 hours.   Radiology    Mr Cardiac Morphology W Wo  Contrast Result Date: 12/13/2018 CLINICAL DATA:  10F presents with AF, NSVT EXAM: CARDIAC MRI TECHNIQUE: The patient was scanned on a 1.5 Tesla Siemens magnet. A dedicated cardiac coil was used. Functional imaging was done using Fiesta sequences. 2,3, and 4 chamber views were done to assess for RWMA's. Modified Simpson's rule using a short axis stack was used to calculate an ejection fraction on a dedicated work Conservation officer, nature. The patient received 8 cc of Gadavist. After 10 minutes inversion recovery sequences were used to assess for infiltration and scar tissue. CONTRAST:  8 cc  of Gadavist FINDINGS: Left ventricle: - Normal size - Mild systolic dysfunction - Ratio noncompacted to compacted myocardium > 2.3 at end-diastole along basal to apical anterior/anterolateral wall. - Inferior RV insertion site LGE LV EF:  52% (Normal 56-78%) Absolute volumes: LV EDV: 189mL (Normal 52-141 mL) LV ESV: 61mL (Normal 13-51 mL) LV SV: 31mL (Normal 33-97 mL) CO: 4.9L/min (Normal 2.7-6.0 L/min) Indexed volumes: LV EDV: 69mL/sq-m (Normal 41-81 mL/sq-m) LV ESV: 8mL/sq-m  (Normal 12-21 mL/sq-m) LV SV: 2mL/sq-m (Normal 26-56 mL/sq-m) CI: 2.6L/min/sq-m (Normal 1.8-3.8 L/min/sq-m) Right ventricle: Normal size and systolic function RV EF: [id number] (Normal 47-80%) Absolute volumes: RV EDV: 137mL (Normal 58-154 mL) RV ESV: 35mL (Normal 12-68 mL) RV SV: 64mL (Normal 35-98 mL) CO: 5.0L/min (Normal 2.7-6 L/min) Indexed volumes: RV EDV: 70mL/sq-m (Normal 48-87 mL/sq-m) RV ESV: 63mL/sq-m (Normal 11-28 mL/sq-m) RV SV: 51mL/sq-m (Normal 27-57 mL/sq-m) CI: 2.7L/min/sq-m (Normal 1.8-3.8 L/min/sq-m) Left atrium: Normal size Right atrium: Mild enlargement Mitral valve: Mild regurgitation Aortic valve: No regurgitation Tricuspid valve: No regurgitation Pericardium: Small to moderate effusion, measuring up to 34mm adjacent to inferior LV wall IMPRESSION: 1. 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 Inferior RV insertion site late gadolinium enhancement, which is a nonspecific finding often seen in setting of elevated pulmonary pressures but has been described in LV noncompaction 3.  Normal LV size with mildly reduced systolic function (EF 123456) 4.  Normal RV size and systolic function (EF [id number]) 5. Small to moderate pericardial effusion, measuring up to 39mm adjacent to inferior LV wall Electronically Signed   By: Oswaldo Milian MD   On: 12/13/2018 21:27    Cardiac Studies   12/11/2018 TTE IMPRESSIONS  1. Left ventricular ejection fraction, by visual estimation, is 50 to 55%. The left ventricle has normal function. There is no left ventricular hypertrophy.  2. Global right ventricle has normal systolic function.The right ventricular size is normal. No increase in right ventricular wall thickness.  3. Left atrial size was normal.  4. Right atrial size was normal.  5. Mild mitral valve prolapse.  6. The mitral valve is normal in structure. Mild mitral valve regurgitation. No evidence of mitral stenosis.  7. The tricuspid valve is normal in  structure. Tricuspid valve regurgitation is trivial.  8. The aortic valve is normal in structure. Aortic valve regurgitation is trivial. Mild aortic valve sclerosis without stenosis.  9. The pulmonic valve was normal in structure. Pulmonic valve regurgitation is trivial. 10. TR signal is inadequate for assessing pulmonary artery systolic pressure. 11. The inferior vena cava is normal in size with greater than 50% respiratory variability, suggesting right atrial pressure of 3 mmHg.  Patient Profile     73 y.o. female HTN, DM, hypothyroidism, breast cancer s/p lumpectomy/XRT in 2018 on anastrozole now, and anxiety  Admitted with a week of recurrent near syncope, at her PMD noted to be in AFib with NSVT as  well and sent to the hospital, also found in AFib w/RVR and having periods of WCT.  She was also observed to have a post termination pause and a pause maintaining SR  Assessment & Plan    1. New onset AFib w/RVR      CHA2DS2Vasc is 4, started on Eliquis here       2. NSVT      Initially suspected to be AF w/abberency, though had more WCT in the setting of SR and is VT     LVEF normal     C.MRI noted above, ? LVNC     No plans to wean her Imipramine   AFib rate controlled, SB/SR 50's-60's, PVCs and NSVT have quieted significantly Transition to PO amiodarone today, continue Toprol Dr. Caryl Comes will discuss MRI further with Dr. Gardiner Rhyme Hopefully home tomorrow  3. HTN     No changes today  4. Hypothyroid     TSH was wnl, continue home med   5. DM     Has SSI   6. Anxiety     She is not sleeping well here, anxious about her husband at home, requests a day-time xanax, will give today, not going home     For questions or updates, please contact [facility] Please consult [email or web address] for contact info under        Signed, Baldwin Jamaica, PA-C  12/14/2018, 8:26 AM    VT-sustained and nonsustained  Atrial fibrillation  Cardiomyopathy?  Left ventricular  noncompaction?  Arrhythmogenic cardiomyopathy?  Hypertension   Ventricular tachycardia and atrial fibrillation are much quieter on amiodarone.  Given the abnormal MR, we will continue her on amiodarone for now.  Reviewed the scan with Dr. Nechama Guard.  He will look as to whether the images are consistent with arrhythmogenic cardiomyopathy.  This might inform further genetic evaluation.   The mechanism of the ventricular tachycardia appears to be automaticity and not reentrant.  Its location origination seemed to be grossly within the area of the abnormality on MR.  Discussed with Dr. Greggory Brandy, will need to consider role of an ICD as backup once the MRI images are clarified.  Continue beta-blockers and anticoagulation  Transition amiodarone from IV--oral

## 2018-12-14 NOTE — Discharge Summary (Addendum)
ELECTROPHYSIOLOGY PROCEDURE DISCHARGE SUMMARY    Patient ID: Cynthia [name],  MRN: [id number], DOB/AGE: 15-May-1945 73 y.o.  Admit date: 12/10/2018 Discharge date: 12/15/2018  Primary Care Physician: [name], MD  Primary Cardiologist: new, Dr. Margaretann Loveless Electrophysiologist: new, Dr. Caryl Comes  Primary Discharge Diagnosis:  1. New onset atrial fibrillation     CHA2DS2Vasc is 4, started on Eliquis 2. VT  Secondary Discharge Diagnosis:  1. HTN 2. Hypothyroidism 3. DM 4. anxiety  No Known Allergies   Procedures This Admission:  None     Brief HPI: Cynthia [name] is a 73 y.o. female  With PMHx of HTN, DM type 2, hypothyroidism, and breast cancer s/p lumpectomy/XRT in 2018 on anastrozole now, and anxiety sought medical attention with new onset recurrent near syncope.  Initially saw her PMD though while there EKG showed new Afib as well as WCT concerning for VT and sent to the ER.  Hospital Course:  The patient was admitted started on diltiazem for rate control in the ER and heparin gtt for anticoagulation, initial labs unrevealing.  Given WCT dilt changed to BB and amiodarone was added. She was observed on telemetry to have a post termination pause, and in AFib isolated pause 5.6 seconds.  Early, this suspect to be her pre-syncope.  Though she described minutes of feeling weak, clammy. EP was called to the case, unclear initially if VT vs aberrancy.  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 TTE noted LVEF 50-55%, no LVH, no WMA, no significant VHD. Verapamil was changed to Toprol.  She continued to have PAfib though rate controlled, no pauses or significant bradycardia. 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  She was transitioned to PO amiodarone.  She had SR with  PAFib though rates controlled.  She has not had further VT, no bradycardia or pauses.  Will continue amiodarone 400mg  BID until her follow up, plan amio labs and titration at that time. No ICD planned at this juncture.  The patient was examined by Dr. Caryl Comes and considered stable for discharge to home.     Physical Exam: Vitals:   12/15/18 0033 12/15/18 0526 12/15/18 0735 12/15/18 0855  BP: 114/69 124/71 120/82 120/79  Pulse: (!) 59 63 68 63  Resp: 18 18 18 14   Temp: 98 F (36.7 C) 97.9 F (36.6 C) 98.4 F (36.9 C) 97.8 F (36.6 C)  TempSrc: Oral Oral Oral Oral  SpO2: 100% 96% 99% 100%  Weight:  76.3 kg    Height:        GEN- The patient is well appearing, alert and oriented x 3 today.   HEENT: normocephalic, atraumatic; sclera clear, conjunctiva pink; hearing intact; oropharynx clear Lungs- CTA b/l, normal work of breathing.  No wheezes, rales, rhonchi Heart- irreg-irreg, no murmurs, rubs or gallops, PMI not laterally displaced GI- soft, non-tender, non-distended Extremities- no clubbing, cyanosis, or edema MS- no significant deformity or atrophy Skin- warm and dry, no rash or lesion Psych- euthymic mood, full affect Neuro- no gross defecits  Labs:   Lab Results  Component Value Date   WBC 7.4 12/15/2018   HGB 11.1 (L) 12/15/2018   HCT 34.3 (L) 12/15/2018   MCV 94.0 12/15/2018   PLT 255 12/15/2018    Recent Labs  Lab 12/10/18 1222  12/15/18 0313  NA 139   < > 142  K 3.6   < > 3.9  CL 105   < > 106  CO2 23   < > 26  BUN 17   < > 15  CREATININE 0.78   < > 0.80  CALCIUM 9.3   < > 8.8*  PROT 6.6  --   --   BILITOT 0.4  --   --   ALKPHOS 37*  --   --   ALT 15  --   --   AST 21  --   --   GLUCOSE 88   < > 89   < > = values in this interval not displayed.    Discharge Medications:  Allergies as of 12/15/2018   No Known Allergies     Medication List    STOP taking these medications   atenolol 25 MG tablet Commonly known as: TENORMIN     TAKE these  medications   acetaminophen 500 MG tablet Commonly known as: TYLENOL Take 500 mg by mouth every 6 (six) hours as needed (for headaches.).   ALPRAZolam 1 MG tablet Commonly known as: XANAX Take 2 mg by mouth at bedtime.   amiodarone 400 MG tablet Commonly known as: PACERONE Take 1 tablet (400 mg total) by mouth 2 (two) times daily.   anastrozole 1 MG tablet Commonly known as: ARIMIDEX Take 1 tablet (1 mg total) by mouth daily.   apixaban 5 MG Tabs tablet Commonly known as: ELIQUIS Take 1 tablet (5 mg total) by mouth 2 (two) times daily.   cholecalciferol 25 MCG (1000 UT) tablet Commonly known as: VITAMIN D3 Take 1,000 Units by mouth daily.   Fenofibric Acid 135 MG Cpdr Take 135 mg by mouth daily.   FERROUS SULFATE PO Take 1 tablet by mouth every evening.   imipramine 50 MG tablet Commonly known as: TOFRANIL Take 50 mg by mouth at bedtime.   levothyroxine 112 MCG tablet Commonly known as: SYNTHROID Take 112 mcg by mouth at bedtime.   metFORMIN 500 MG tablet Commonly known as: GLUCOPHAGE Take 500 mg by mouth 2 (two) times daily.   metoprolol succinate 50 MG 24 hr tablet Commonly known as: TOPROL-XL Take 1 tablet (50 mg total) by mouth daily. Take with or immediately following a meal. Start taking on: December 16, 2018   psyllium 0.52 g capsule Commonly known as: REGULOID Take 0.52 g by mouth daily.   traZODone 150 MG tablet Commonly known as: DESYREL Take 150 mg by mouth at bedtime.   triamterene-hydrochlorothiazide 37.5-25 MG tablet Commonly known as: MAXZIDE-25 Take 1 tablet by mouth daily.       Disposition:  Home Discharge Instructions    Diet - low sodium heart healthy   Complete by: As directed    Increase activity slowly   Complete by: As directed      Follow-up Information    Baldwin Jamaica, PA-C Follow up.   Specialty: Cardiology Why: 01/04/2019 @ 3:15PM Contact information: [address] [place] [address]  [phone]        [name], MD.   Specialty: Family Medicine Why: Please follow up in a week Contact information: [address] [place] [address] [phone]           Duration of Discharge Encounter: Greater than 30 minutes including physician time.  Venetia Night, PA-C 12/15/2018 12:24 PM

## 2018-12-15 ENCOUNTER — Other Ambulatory Visit: Payer: Self-pay | Admitting: Physician Assistant

## 2018-12-15 LAB — BASIC METABOLIC PANEL
Anion gap: 10 (ref 5–15)
BUN: 15 mg/dL (ref 8–23)
CO2: 26 mmol/L (ref 22–32)
Calcium: 8.8 mg/dL — ABNORMAL LOW (ref 8.9–10.3)
Chloride: 106 mmol/L (ref 98–111)
Creatinine, Ser: 0.8 mg/dL (ref 0.44–1.00)
GFR calc Af Amer: >60 mL/min (ref >60)
GFR calc non Af Amer: >60 mL/min (ref >60)
Glucose, Bld: 89 mg/dL (ref 70–99)
Potassium: 3.9 mmol/L (ref 3.5–5.1)
Sodium: 142 mmol/L (ref 135–145)

## 2018-12-15 LAB — CBC
HCT: 34.3 % — ABNORMAL LOW (ref 36.0–46.0)
Hemoglobin: 11.1 g/dL — ABNORMAL LOW (ref 12.0–15.0)
MCH: 30.4 pg (ref 26.0–34.0)
MCHC: 32.4 g/dL (ref 30.0–36.0)
MCV: 94 fL (ref 80.0–100.0)
Platelets: 255 10*3/uL (ref 150–400)
RBC: 3.65 MIL/uL — ABNORMAL LOW (ref 3.87–5.11)
RDW: 13.2 % (ref 11.5–15.5)
WBC: 7.4 10*3/uL (ref 4.0–10.5)
nRBC: 0 % (ref 0.0–0.2)

## 2018-12-15 LAB — GLUCOSE, CAPILLARY: Glucose-Capillary: 85 mg/dL (ref 70–99)

## 2018-12-15 LAB — MAGNESIUM: Magnesium: 1.9 mg/dL (ref 1.7–2.4)

## 2018-12-15 MED ORDER — AMIODARONE HCL 400 MG PO TABS: 400.0000 mg | ORAL_TABLET | Freq: Two times a day (BID) | ORAL | 1 refills | Status: DC

## 2018-12-15 MED ORDER — METOPROLOL SUCCINATE ER 50 MG PO TB24: 50.0000 mg | ORAL_TABLET | Freq: Every day | ORAL | 6 refills | Status: DC

## 2018-12-15 MED ORDER — APIXABAN 5 MG PO TABS: 5.0000 mg | ORAL_TABLET | Freq: Two times a day (BID) | ORAL | 6 refills | Status: DC

## 2018-12-15 NOTE — Progress Notes (Signed)
Discharge education and medication education given to patient  with teach back. Education on low sodium diet and increasing activity slowly given, patient verbalizes understanding.  All questions and concerns answered. Peripheral IV and telemetry leads removed. All patient belongings given to patient. Patient transported to main entrance by nurse tech via wheelchair.

## 2018-12-16 NOTE — Telephone Encounter (Signed)
Pt has not been seen in the office but is requesting amiodarone 400 mg bid, provider is RU. Please advise thank you.

## 2018-12-24 DIAGNOSIS — Z09 Encounter for follow-up examination after completed treatment for conditions other than malignant neoplasm: Secondary | ICD-10-CM | POA: Diagnosis not present

## 2018-12-24 DIAGNOSIS — I4891 Unspecified atrial fibrillation: Secondary | ICD-10-CM | POA: Diagnosis not present

## 2018-12-24 DIAGNOSIS — I472 Ventricular tachycardia: Secondary | ICD-10-CM | POA: Diagnosis not present

## 2018-12-29 ENCOUNTER — Ambulatory Visit: Payer: Medicare PPO | Attending: Internal Medicine

## 2018-12-29 DIAGNOSIS — Z20828 Contact with and (suspected) exposure to other viral communicable diseases: Secondary | ICD-10-CM | POA: Diagnosis not present

## 2018-12-29 DIAGNOSIS — Z20822 Contact with and (suspected) exposure to covid-19: Secondary | ICD-10-CM

## 2018-12-31 LAB — NOVEL CORONAVIRUS, NAA: SARS-CoV-2, NAA: NOT DETECTED

## 2019-01-02 NOTE — Progress Notes (Signed)
Cardiology Office Note Date:  01/04/2019  Patient ID:  Yisell, Sprunger 25-Oct-1945, MRN [id number] PCP:  [name], MD  Cardiologist:  Dr. Margaretann Loveless (new last admission) Electrophysiologist: Dr. Caryl Comes, (new last admission)    Chief Complaint:  hospital follow up  History of Present Illness: Michille Mcelrath is a 73 y.o. female with history of HTN, DM type 2, hypothyroidism, and breast cancer s/p lumpectomy/XRT in 2018 on anastrozole now, and anxiety, new onset AFib, VT.  12/10/2018 she was sent to the ER via her PMD where she went w/recurrent near syncope there EKG showed new Afib as well as WCT concerning for VT, she was admitted started on diltiazem for rate control in the ER and heparin gtt for anticoagulation, initial labs unrevealing.  Given WCT dilt changed to BB and amiodarone was added. She was observed on telemetry to have a post termination pause, and in AFib isolated pause 5.6 seconds.  Early, this suspect to be her pre-syncope.  Though she described minutes of feeling weak, clammy. EP was called to the case, unclear initially if VT vs aberrancy.  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 TTE noted LVEF 50-55%, no LVH, no WMA, no significant VHD. Verapamil was changed to Toprol.  She continued to have PAfib though rate controlled, no pauses or significant bradycardia. 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 (though Dr. Caryl Comes seemed unconvinced of this with plans for further d/w Dr. Gardiner Rhyme)  She was transitioned to PO amiodarone.  She had SR with PAFib though rates controlled.  She has not had further VT, no bradycardia or pauses.  She was discharged on amiodarone 435m BID until her follow up, planned for amio labs and titration at that time. No ICD was planned at this  juncture.   She feels well. No cardiac awareness at all.  No CP, palpitations or SOB.  She has not had any further weak or near syncopal episodes, never has had syncope.  Since the hospital stay feels like she tires a bit more easy, but has no difficulties with her ADLs, no rest SOB.  She reports compliance with her Eliquis with no missed doses taking it BID.  Has been 3 weeks since her discharge on Eliquis.  She mentions prior to the start of Eliquis about September she had noticed some blood tinged urine, and was treated for a UTI, since then has had an occasional episode of the same.  Since being on Eliquis, she has noted more frequently some blood in her urine.  NO pain, no frequency or urgency, no fever or symptoms of illnes.  No frank blood, or clots. She has called her PMD for guidance, requesting urology referral.  She has not interrupted her Eliquis.   Past Medical History:  Diagnosis Date  . Anxiety   . Arthritis   . Cancer ([facility]    breast cance  . DDD (degenerative disc disease), lumbar   . Depression   . Diabetes mellitus without complication ([place]    Takes metformin but has never been told she was diabetic  . Genetic testing 06/25/2016   Ms. Savoia underwent genetic counseling and testing for hereditary cancer syndromes on 06/17/2016. Her results were negative for mutations in all 46 genes analyzed by [facility]'s 46-gene Common Hereditary Cancers Panel.  Genes analyzed include: APC, ATM, AXIN2, BARD1, BMPR1A, BRCA1, BRCA2, BRIP1, CDH1, CDKN2A, CHEK2, CTNNA1, DICER1, EPCAM, GREM1, HOXB13, KIT, MEN1, MLH1, MSH2, MSH3, MSH6, MUTYH, NBN,  . GERD (gastroesophageal reflux disease)    OTC tums  . History of radiation therapy 05/22/2016 to 06/20/2016  . Hypertension   . Hypothyroidism   . IBS (irritable bowel syndrome)   . Insomnia   . Restless leg syndrome   . Thyroid disease     Past Surgical History:  Procedure Laterality Date  . BREAST LUMPECTOMY WITH RADIOACTIVE SEED AND  SENTINEL LYMPH NODE BIOPSY Right 04/01/2016   Procedure: RADIOACTIVE SEED GUIDED RIGHT BREAST LUMPECTOMY WITH RIGHT AXILLARY SENTINEL LYMPH NODE BIOPSY.;  Surgeon: Fanny Skates, MD;  Location: [facility];  Service: General;  Laterality: Right;  . CATARACT EXTRACTION    . CHOLECYSTECTOMY      Current Outpatient Medications  Medication Sig Dispense Refill  . acetaminophen (TYLENOL) 500 MG tablet Take 500 mg by mouth every 6 (six) hours as needed (for headaches.).    Marland Kitchen ALPRAZolam (XANAX) 1 MG tablet Take 2 mg by mouth at bedtime.    Marland Kitchen amiodarone (PACERONE) 400 MG tablet Take 1 tablet (400 mg total) by mouth 2 (two) times daily. 60 tablet 1  . anastrozole (ARIMIDEX) 1 MG tablet Take 1 tablet (1 mg total) by mouth daily. 90 tablet 4  . apixaban (ELIQUIS) 5 MG TABS tablet Take 1 tablet (5 mg total) by mouth 2 (two) times daily. 60 tablet 6  . cholecalciferol (VITAMIN D3) 25 MCG (1000 UT) tablet Take 1,000 Units by mouth daily.    . Choline Fenofibrate (FENOFIBRIC ACID) 135 MG CPDR Take 135 mg by mouth daily.    Marland Kitchen FERROUS SULFATE PO Take 1 tablet by mouth every evening.    Marland Kitchen imipramine (TOFRANIL) 50 MG tablet Take 50 mg by mouth at bedtime.    Marland Kitchen levothyroxine (SYNTHROID, LEVOTHROID) 112 MCG tablet Take 112 mcg by mouth at bedtime.    . metFORMIN (GLUCOPHAGE) 500 MG tablet Take 500 mg by mouth 2 (two) times daily.    . metoprolol succinate (TOPROL-XL) 50 MG 24 hr tablet Take 1 tablet (50 mg total) by mouth daily. Take with or immediately following a meal. 30 tablet 6  . psyllium (REGULOID) 0.52 g capsule Take 0.52 g by mouth daily.     . traZODone (DESYREL) 150 MG tablet Take 150 mg by mouth at bedtime.    . triamterene-hydrochlorothiazide (MAXZIDE-25) 37.5-25 MG tablet Take 1 tablet by mouth daily.     No current facility-administered medications for this visit.    Allergies:   Patient has no known allergies.   Social History:  The patient  reports that she has never smoked. She has never used  smokeless tobacco. She reports current alcohol use. She reports that she does not use drugs.   Family History:  The patient's family history includes Breast cancer (age of onset: 25) in an other family member; Breast cancer (age of onset: 98) in her sister; Cervical cancer (age of onset: 51) in her sister; Leukemia (age of onset: 26) in her brother; Ovarian cancer (age of onset: 40) in her maternal aunt.  ROS:  Please see the history of present illness.  All other systems are reviewed and otherwise negative.   PHYSICAL EXAM:  VS:  BP 100/72   Pulse 99   Ht 5' 5.5" (1.664 m)   Wt 165 lb (74.8 kg)   BMI 27.04 kg/m  BMI: Body mass index  is 27.04 kg/m. Well nourished, well developed, in no acute distress  HEENT: normocephalic, atraumatic  Neck: no JVD, carotid bruits or masses Cardiac:  RRR; no significant murmurs, no rubs, or gallops Lungs:  CTA, no wheezing, rhonchi or rales  Abd: soft, nontender MS: no deformity or atrophy Ext: no edema  Skin: warm and dry, no rash Neuro:  No gross deficits appreciated Psych: euthymic mood, full affect   EKG:  Done today shows AFlutter 2:1, 99bpm   Mr Cardiac Morphology W Wo Contrast Result Date: 12/13/2018 CLINICAL DATA:  71F presents with AF, NSVT EXAM: CARDIAC MRI TECHNIQUE: The patient was scanned on a 1.5 Tesla Siemens magnet. A dedicated cardiac coil was used. Functional imaging was done using Fiesta sequences. 2,3, and 4 chamber views were done to assess for RWMA's. Modified Simpson's rule using a short axis stack was used to calculate an ejection fraction on a dedicated work Conservation officer, nature. The patient received 8 cc of Gadavist. After 10 minutes inversion recovery sequences were used to assess for infiltration and scar tissue. CONTRAST:  8 cc  of Gadavist FINDINGS: Left ventricle: - Normal size - Mild systolic dysfunction - Ratio noncompacted to compacted myocardium > 2.3 at end-diastole along basal to apical  anterior/anterolateral wall. - Inferior RV insertion site LGE LV EF:  52% (Normal 56-78%) Absolute volumes: LV EDV: 169m (Normal 52-141 mL) LV ESV: 641m(Normal 13-51 mL) LV SV: 714mNormal 33-97 mL) CO: 4.9L/min (Normal 2.7-6.0 L/min) Indexed volumes: LV EDV: 46m33m-m (Normal 41-81 mL/sq-m) LV ESV: 36mL60mm (Normal 12-21 mL/sq-m) LV SV: 38mL/62m (Normal 26-56 mL/sq-m) CI: 2.6L/min/sq-m (Normal 1.8-3.8 L/min/sq-m) Right ventricle: Normal size and systolic function RV EF: 55% (N55%al 47-80%) Absolute volumes: RV EDV: 131mL (67mal 58-154 mL) RV ESV: 59mL (N49ml 12-68 mL) RV SV: 72mL (No85m 35-98 mL) CO: 5.0L/min (Normal 2.7-6 L/min) Indexed volumes: RV EDV: 70mL/sq-m91mrmal 48-87 mL/sq-m) RV ESV: 32mL/sq-m 53mmal 11-28 mL/sq-m) RV SV: 39mL/sq-m (36mal 27-57 mL/sq-m) CI: 2.7L/min/sq-m (Normal 1.8-3.8 L/min/sq-m) Left atrium: Normal size Right atrium: Mild enlargement Mitral valve: Mild regurgitation Aortic valve: No regurgitation Tricuspid valve: No regurgitation Pericardium: Small to moderate effusion, measuring up to 10mm adjacen79m inferior LV wall IMPRESSION: 1. 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 Inferior RV insertion site late gadolinium enhancement, which is a nonspecific finding often seen in setting of elevated pulmonary pressures but has been described in LV noncompaction 3.  Normal LV size with mildly reduced systolic function (EF 52%) 4.  Norm37%RV size and systolic function (EF 55%) 5. Small48% moderate pericardial effusion, measuring up to 10mm adjacent23minferior LV wall Electronically Signed   By: Christopher  SOswaldo Milian7/2020 21:27     12/11/2018 TTE IMPRESSIONS 1. Left ventricular ejection fraction, by visual estimation, is 50 to 55%. The left ventricle has normal function. There is no left ventricular hypertrophy. 2. Global right ventricle has normal systolic function.The right ventricular size is normal. No  increase in right ventricular wall thickness. 3. Left atrial size was normal. 4. Right atrial size was normal. 5. Mild mitral valve prolapse. 6. The mitral valve is normal in structure. Mild mitral valve regurgitation. No evidence of mitral stenosis. 7. The tricuspid valve is normal in structure. Tricuspid valve regurgitation is trivial. 8. The aortic valve is normal in structure. Aortic valve regurgitation is trivial. Mild aortic valve sclerosis without stenosis. 9. The pulmonic valve was normal in structure. Pulmonic valve regurgitation is trivial. 10.  TR signal is inadequate for assessing pulmonary artery systolic pressure. 11. The inferior vena cava is normal in size with greater than 50% respiratory variability, suggesting right atrial pressure of 3 mmHg.   Recent Labs: 12/10/2018: ALT 15; TSH 0.895 12/15/2018: BUN 15; Creatinine, Ser 0.80; Hemoglobin 11.1; Magnesium 1.9; Platelets 255; Potassium 3.9; Sodium 142  12/11/2018: Cholesterol 135; HDL 48; LDL Cholesterol 71; Total CHOL/HDL Ratio 2.8; Triglycerides 81; VLDL 16   Estimated Creatinine Clearance: 62.2 mL/min (by C-G formula based on SCr of 0.8 mg/dL).   Wt Readings from Last 3 Encounters:  01/04/19 165 lb (74.8 kg)  12/15/18 168 lb 4.8 oz (76.3 kg)  09/22/18 161 lb 3.2 oz (73.1 kg)     Other studies reviewed: Additional studies/records reviewed today include: summarized above  ASSESSMENT AND PLAN:  1. New onset AFib w/RVR      CHA2DS2Vasc is 4, started on Eliquis,  appropriately dosed      She is in AFlutter today, looks typical, though was observed to have Afib as well in the hospital D/w Dr. Caryl Comes Even though she was in/out of fib/flutter, feels after 3 weeks of amio load, in flutter today, rather then monitoring to proceed with DCCV  Will plan for DCCV, discussed importance of not missing any eliquis doses AFib clinic follow up 3 weeks post DCCV  Recommend buying a pulse ox, her sinus rates by EKGs 60's, her  flutter rate today 99-100bpm.   She is instructed to monitor her HR daily with the pulse ox.  (rather then holter/event monitoring If routinely she gets HR 100 or greater would suspect she is out of rhythm. She is asymptomatic with her AFlutter today, and if no symptoms to reach out to the Afib clinic or [place]       Reduce amiodarone to 275m daily  BMET, CBC, TSH, and LFTs today    2. NSVT      LVEF normal     C.MRI noted above, ? LVNC, Dr. KCaryl Comesis not convinced of this diagnosis     reduce amiodarone to 2061mtoday (s/p 3 weeks 40063mID)     No recurrent weak or near syncope     Normal LVEF, no hx of syncope   3. HTN     a recheck of her BP is 110/78     No changes   4. Hematuria     Predates Eliquis, noted more persistently since     No urinary symptoms, no pain     She will again reach out to PMD   Disposition: F/u with as above  Current medicines are reviewed at length with the patient today.  The patient did not have any concerns regarding medicines.  SigVenetia NightA-C 01/04/2019 4:39 PM     [facility]  [phone])  [phone])

## 2019-01-04 ENCOUNTER — Encounter: Payer: Self-pay | Admitting: *Deleted

## 2019-01-04 ENCOUNTER — Ambulatory Visit: Payer: Medicare PPO | Admitting: Physician Assistant

## 2019-01-04 ENCOUNTER — Other Ambulatory Visit: Payer: Self-pay

## 2019-01-04 VITALS — BP 100/72 | HR 99 | Ht 65.5 in | Wt 165.0 lb

## 2019-01-04 DIAGNOSIS — Z79899 Other long term (current) drug therapy: Secondary | ICD-10-CM | POA: Diagnosis not present

## 2019-01-04 DIAGNOSIS — I472 Ventricular tachycardia, unspecified: Secondary | ICD-10-CM

## 2019-01-04 DIAGNOSIS — I480 Paroxysmal atrial fibrillation: Secondary | ICD-10-CM | POA: Diagnosis not present

## 2019-01-04 DIAGNOSIS — I483 Typical atrial flutter: Secondary | ICD-10-CM | POA: Diagnosis not present

## 2019-01-04 MED ORDER — AMIODARONE HCL 200 MG PO TABS: 200.0000 mg | ORAL_TABLET | Freq: Every day | ORAL | 1 refills | Status: DC

## 2019-01-04 NOTE — Addendum Note (Signed)
Addended by: Claude Manges on: 01/04/2019 05:08 PM   Modules accepted: Orders

## 2019-01-04 NOTE — Patient Instructions (Addendum)
Medication Instructions:   START TAKING:  AMIODARONE 200 MG ONCE A DAY   *If you need a refill on your cardiac medications before your next appointment, please call your pharmacy*  Lab Work:  BMET  CBC TSH LFT TODAY   If you have labs (blood work) drawn today and your tests are completely normal, you will receive your results only by: Marland Kitchen MyChart Message (if you have MyChart) OR . A paper copy in the mail If you have any lab test that is abnormal or we need to change your treatment, we will call you to review the results.  Testing/Procedures:   SEE LETTER Your physician has recommended that you have a Cardioversion (DCCV). Electrical Cardioversion uses a jolt of electricity to your heart either through paddles or wired patches attached to your chest. This is a controlled, usually prescheduled, procedure. Defibrillation is done under light anesthesia in the hospital, and you usually go home the day of the procedure. This is done to get your heart back into a normal rhythm. You are not awake for the procedure. Please see the instruction sheet given to you today.   Follow-Up: At [facility], you and your health needs are our priority.  As part of our continuing mission to provide you with exceptional heart care, we have created designated Provider Care Teams.  These Care Teams include your primary Cardiologist (physician) and Advanced Practice Providers (APPs -  Physician Assistants and Nurse Practitioners) who all work together to provide you with the care you need, when you need it.  Your next appointment:    3 week(s)   AFIB CLINIC AFTER 01-20-2019   The format for your next appointment:   In Person   Other Instructions    Purchase Pulse to Monitor Heart rate AND IF OVER 100 CONSISTENTLY please contact clinic to Further discuss

## 2019-01-05 ENCOUNTER — Other Ambulatory Visit: Payer: Medicare PPO | Admitting: *Deleted

## 2019-01-05 DIAGNOSIS — Z79899 Other long term (current) drug therapy: Secondary | ICD-10-CM | POA: Diagnosis not present

## 2019-01-05 LAB — HEPATIC FUNCTION PANEL
ALT: 11 IU/L (ref 0–32)
AST: 16 IU/L (ref 0–40)
Albumin: 4.1 g/dL (ref 3.7–4.7)
Alkaline Phosphatase: 39 IU/L (ref 39–117)
Bilirubin Total: 0.3 mg/dL (ref 0.0–1.2)
Bilirubin, Direct: 0.14 mg/dL (ref 0.00–0.40)
Total Protein: 6.5 g/dL (ref 6.0–8.5)

## 2019-01-05 LAB — BASIC METABOLIC PANEL
BUN/Creatinine Ratio: 18 (ref 12–28)
BUN: 18 mg/dL (ref 8–27)
CO2: 27 mmol/L (ref 20–29)
Calcium: 9.3 mg/dL (ref 8.7–10.3)
Chloride: 98 mmol/L (ref 96–106)
Creatinine, Ser: 1 mg/dL (ref 0.57–1.00)
GFR calc Af Amer: 64 mL/min/{1.73_m2} (ref >59)
GFR calc non Af Amer: 55 mL/min/{1.73_m2} — ABNORMAL LOW (ref >59)
Glucose: 95 mg/dL (ref 65–99)
Potassium: 3.9 mmol/L (ref 3.5–5.2)
Sodium: 139 mmol/L (ref 134–144)

## 2019-01-05 LAB — CBC
Hematocrit: 40.6 % (ref 34.0–46.6)
Hemoglobin: 13.4 g/dL (ref 11.1–15.9)
MCH: 30.5 pg (ref 26.6–33.0)
MCHC: 33 g/dL (ref 31.5–35.7)
MCV: 93 fL (ref 79–97)
Platelets: 393 10*3/uL (ref 150–450)
RBC: 4.39 x10E6/uL (ref 3.77–5.28)
RDW: 12.2 % (ref 11.7–15.4)
WBC: 6.4 10*3/uL (ref 3.4–10.8)

## 2019-01-05 LAB — TSH: TSH: 2.85 u[IU]/mL (ref 0.450–4.500)

## 2019-01-05 NOTE — Addendum Note (Signed)
Addended by: Claude Manges on: 01/05/2019 03:48 PM   Modules accepted: Orders

## 2019-01-14 ENCOUNTER — Inpatient Hospital Stay (HOSPITAL_COMMUNITY): Admission: RE | Admit: 2019-01-14 | Payer: Medicare PPO | Source: Ambulatory Visit

## 2019-01-14 ENCOUNTER — Telehealth: Payer: Self-pay | Admitting: *Deleted

## 2019-01-14 NOTE — Telephone Encounter (Signed)
Spoke with patient who has had a family emergency and needs to r/s covid  screen 01-14-19 and dccv  with Dr Ernst Bowler.Cynthia [name]  01-20-19.  Patient now r/s to 01-22-19 Covid screen  01-25-19 dccv  with Dr. Margaretann Loveless (dccv same time as 01-20-19) Patient agreeable with time and dates.

## 2019-01-22 ENCOUNTER — Other Ambulatory Visit (HOSPITAL_COMMUNITY)
Admission: RE | Admit: 2019-01-22 | Discharge: 2019-01-22 | Disposition: A | Discharge status: Home / Self Care | Payer: Medicare PPO | Source: Ambulatory Visit | Attending: Internal Medicine | Admitting: Internal Medicine

## 2019-01-22 DIAGNOSIS — Z20822 Contact with and (suspected) exposure to covid-19: Secondary | ICD-10-CM | POA: Insufficient documentation

## 2019-01-22 DIAGNOSIS — Z01812 Encounter for preprocedural laboratory examination: Secondary | ICD-10-CM | POA: Insufficient documentation

## 2019-01-22 LAB — SARS CORONAVIRUS 2 (TAT 6-24 HRS): SARS Coronavirus 2: NEGATIVE

## 2019-01-25 ENCOUNTER — Ambulatory Visit (HOSPITAL_COMMUNITY)
Admission: RE | Admit: 2019-01-25 | Discharge: 2019-01-25 | Disposition: A | Discharge status: Home / Self Care | Payer: Medicare PPO | Attending: Internal Medicine | Admitting: Internal Medicine

## 2019-01-25 ENCOUNTER — Encounter (HOSPITAL_COMMUNITY)
Admission: RE | Disposition: A | Discharge status: Home / Self Care | Payer: Self-pay | Source: Home / Self Care | Attending: Internal Medicine

## 2019-01-25 DIAGNOSIS — Z539 Procedure and treatment not carried out, unspecified reason: Secondary | ICD-10-CM | POA: Diagnosis not present

## 2019-01-25 SURGERY — CANCELLED PROCEDURE

## 2019-01-25 NOTE — Progress Notes (Signed)
Patient arrived to endo for cardioversion, upon checking her rhythm, she appears to be in NSR. EKG confirms sinus brady. Dr Margaretann Loveless aware. Patient discharged.

## 2019-01-31 DIAGNOSIS — R310 Gross hematuria: Secondary | ICD-10-CM | POA: Diagnosis not present

## 2019-02-08 ENCOUNTER — Ambulatory Visit (HOSPITAL_COMMUNITY): Payer: Medicare PPO | Admitting: Nurse Practitioner

## 2019-02-10 DIAGNOSIS — R310 Gross hematuria: Secondary | ICD-10-CM | POA: Diagnosis not present

## 2019-02-14 ENCOUNTER — Ambulatory Visit (HOSPITAL_COMMUNITY)
Admission: RE | Admit: 2019-02-14 | Discharge: 2019-02-14 | Disposition: A | Discharge status: Home / Self Care | Payer: Medicare PPO | Source: Ambulatory Visit | Attending: Nurse Practitioner | Admitting: Nurse Practitioner

## 2019-02-14 ENCOUNTER — Other Ambulatory Visit: Payer: Self-pay

## 2019-02-14 ENCOUNTER — Encounter (HOSPITAL_COMMUNITY): Payer: Self-pay | Admitting: Nurse Practitioner

## 2019-02-14 VITALS — BP 116/60 | HR 47 | Ht 65.5 in | Wt 168.0 lb

## 2019-02-14 DIAGNOSIS — Z853 Personal history of malignant neoplasm of breast: Secondary | ICD-10-CM | POA: Insufficient documentation

## 2019-02-14 DIAGNOSIS — M1990 Unspecified osteoarthritis, unspecified site: Secondary | ICD-10-CM | POA: Diagnosis not present

## 2019-02-14 DIAGNOSIS — I4510 Unspecified right bundle-branch block: Secondary | ICD-10-CM | POA: Insufficient documentation

## 2019-02-14 DIAGNOSIS — F329 Major depressive disorder, single episode, unspecified: Secondary | ICD-10-CM | POA: Insufficient documentation

## 2019-02-14 DIAGNOSIS — Z7984 Long term (current) use of oral hypoglycemic drugs: Secondary | ICD-10-CM | POA: Insufficient documentation

## 2019-02-14 DIAGNOSIS — I10 Essential (primary) hypertension: Secondary | ICD-10-CM | POA: Insufficient documentation

## 2019-02-14 DIAGNOSIS — Z923 Personal history of irradiation: Secondary | ICD-10-CM | POA: Insufficient documentation

## 2019-02-14 DIAGNOSIS — Z79890 Hormone replacement therapy (postmenopausal): Secondary | ICD-10-CM | POA: Diagnosis not present

## 2019-02-14 DIAGNOSIS — F419 Anxiety disorder, unspecified: Secondary | ICD-10-CM | POA: Diagnosis not present

## 2019-02-14 DIAGNOSIS — E119 Type 2 diabetes mellitus without complications: Secondary | ICD-10-CM | POA: Insufficient documentation

## 2019-02-14 DIAGNOSIS — D6869 Other thrombophilia: Secondary | ICD-10-CM | POA: Diagnosis not present

## 2019-02-14 DIAGNOSIS — I4891 Unspecified atrial fibrillation: Secondary | ICD-10-CM | POA: Insufficient documentation

## 2019-02-14 DIAGNOSIS — G2581 Restless legs syndrome: Secondary | ICD-10-CM | POA: Insufficient documentation

## 2019-02-14 DIAGNOSIS — E079 Disorder of thyroid, unspecified: Secondary | ICD-10-CM | POA: Diagnosis not present

## 2019-02-14 DIAGNOSIS — Z7901 Long term (current) use of anticoagulants: Secondary | ICD-10-CM | POA: Diagnosis not present

## 2019-02-14 DIAGNOSIS — E039 Hypothyroidism, unspecified: Secondary | ICD-10-CM | POA: Diagnosis not present

## 2019-02-14 DIAGNOSIS — Z79899 Other long term (current) drug therapy: Secondary | ICD-10-CM | POA: Diagnosis not present

## 2019-02-14 DIAGNOSIS — I480 Paroxysmal atrial fibrillation: Secondary | ICD-10-CM | POA: Diagnosis not present

## 2019-02-14 DIAGNOSIS — K219 Gastro-esophageal reflux disease without esophagitis: Secondary | ICD-10-CM | POA: Insufficient documentation

## 2019-02-14 DIAGNOSIS — M5136 Other intervertebral disc degeneration, lumbar region: Secondary | ICD-10-CM | POA: Insufficient documentation

## 2019-02-14 MED ORDER — METOPROLOL SUCCINATE ER 25 MG PO TB24: ORAL_TABLET | ORAL | 6 refills | Status: DC

## 2019-02-14 NOTE — Progress Notes (Addendum)
Primary Care Physician: [name], MD Referring Physician: Dr. Pat Kocher Cardiologist: Dr. Laural Roes Cynthia [name] is a 74 y.o. female with a h/o PMHx of HTN, DM type 2, hypothyroidism, and breast cancer s/p lumpectomy/XRT in 2018 on anastrozole now, and anxiety sought medical attention with new onset recurrent near syncope.  Initially saw her PMD though while there EKG showed new Afib as well as WCT concerning for VT and sent to the ER.  The patient was admitted started on diltiazem for rate control in the ER and heparin gtt for anticoagulation, initial labs unrevealing.  Given WCT dilt changed to BB and amiodarone was added. She was observed on telemetry to have a post termination pause, and in AFib isolated pause 5.6 seconds.  Early, this suspect to be her pre-syncope.  Though she described minutes of feeling weak, clammy. EP was called to the case, unclear initially if VT vs aberrancy.  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 TTE noted LVEF 50-55%, no LVH, no WMA, no significant VHD. Verapamil was changed to Toprol.  She continued to have PAfib though rate controlled, no pauses or significant bradycardia. 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  She was transitioned to PO amiodarone.  She had SR with PAFib though rates controlled.  She has not had further VT, no bradycardia or pauses  No ICD planned at this juncture.  In the afib clinic, today, she is in sinus brady at 47 bpm, feels some fatigue, but no further afib. She arrived for cardioversion, 01/25/19 but was in SR so it was cancelled.  She voices no other complaints today. She continues on amiodarone 200 mg daily. She has noted some hand tremor. Continues on eliquis 5 mg bid for a CHA2DS2VASc score of 5.  Today, she denies symptoms of palpitations, chest pain, shortness of breath, orthopnea, PND, lower extremity edema, dizziness, presyncope, syncope, or neurologic sequela. The patient is tolerating medications without difficulties and is otherwise without complaint today.   Past Medical History:  Diagnosis Date  . Anxiety   . Arthritis   . Cancer ([place])    breast cance  . DDD (degenerative disc disease), lumbar   . Depression   . Diabetes mellitus without complication ([place])    Takes metformin but has never been told she was diabetic  . Genetic testing 06/25/2016   [name] underwent genetic counseling and testing for hereditary cancer syndromes on 06/17/2016. Her results were negative for mutations in all 46 genes analyzed by [facility]'s 46-gene Common Hereditary Cancers Panel. Genes analyzed include: APC, ATM, AXIN2, BARD1, BMPR1A, BRCA1, BRCA2, BRIP1, CDH1, CDKN2A, CHEK2, CTNNA1, DICER1, EPCAM, GREM1, HOXB13, KIT, MEN1, MLH1, MSH2, MSH3, MSH6, MUTYH, NBN,  . GERD (gastroesophageal reflux disease)    OTC tums  . History of radiation therapy 05/22/2016 to 06/20/2016  . Hypertension   . Hypothyroidism   . IBS (irritable bowel syndrome)   . Insomnia   . Restless leg syndrome   . Thyroid disease    Past Surgical History:  Procedure Laterality Date  . BREAST LUMPECTOMY WITH RADIOACTIVE SEED AND SENTINEL LYMPH NODE BIOPSY Right 04/01/2016   Procedure: RADIOACTIVE SEED GUIDED RIGHT BREAST LUMPECTOMY WITH RIGHT AXILLARY SENTINEL LYMPH NODE BIOPSY.;  Surgeon: Fanny Skates, MD;  Location: [place];  Service: General;  Laterality: Right;  . CATARACT EXTRACTION    . CHOLECYSTECTOMY      Current Outpatient Medications  Medication Sig Dispense Refill  . ALPRAZolam (XANAX) 1 MG tablet Take 1 mg by mouth at bedtime.     Marland Kitchen amiodarone (PACERONE) 200 MG tablet Take 1 tablet (200 mg total) by mouth daily. 90 tablet 1  . anastrozole (ARIMIDEX) 1 MG tablet Take 1 tablet (1 mg total) by mouth daily. 90  tablet 4  . apixaban (ELIQUIS) 5 MG TABS tablet Take 1 tablet (5 mg total) by mouth 2 (two) times daily. 60 tablet 6  . cholecalciferol (VITAMIN D3) 25 MCG (1000 UT) tablet Take 1,000 Units by mouth daily.    . Choline Fenofibrate (FENOFIBRIC ACID) 135 MG CPDR Take 135 mg by mouth daily.    . ferrous sulfate 325 (65 FE) MG tablet Take 325 mg by mouth daily with breakfast.    . imipramine (TOFRANIL) 50 MG tablet Take 50 mg by mouth at bedtime.    Marland Kitchen levothyroxine (SYNTHROID, LEVOTHROID) 112 MCG tablet Take 112 mcg by mouth daily before breakfast.     . metFORMIN (GLUCOPHAGE) 500 MG tablet Take 500 mg by mouth 2 (two) times daily.    . metoprolol succinate (TOPROL-XL) 50 MG 24 hr tablet Take 1 tablet (50 mg total) by mouth daily. Take with or immediately following a meal. 30 tablet 6  . psyllium (REGULOID) 0.52 g capsule Take 2.6-3.12 g by mouth 2 (two) times daily.     . traZODone (DESYREL) 150 MG tablet Take 150 mg by mouth at bedtime.    . triamterene-hydrochlorothiazide (MAXZIDE-25) 37.5-25 MG tablet Take 1 tablet by mouth daily.     No current facility-administered medications for this encounter.    No Known Allergies  Social History   Socioeconomic History  . Marital status: Married    Spouse name: Not on file  . Number of children: Not on file  . Years of education: Not on file  . Highest education level: Not on file  Occupational History  . Not on file  Tobacco Use  . Smoking status: Never Smoker  . Smokeless tobacco: Never Used  Substance and Sexual Activity  . Alcohol use: Yes    Alcohol/week: 2.0 - 3.0 standard drinks    Types: 2 - 3 Standard drinks or equivalent per week    Comment: occasionally  . Drug use: No  . Sexual activity: Not on file  Other Topics Concern  . Not on file  Social History Narrative  . Not on file   Social Determinants of Health   Financial Resource Strain:   . Difficulty of Paying Living Expenses: Not on file  Food Insecurity:   .  Worried About Charity fundraiser in the Last Year: Not on file  . Ran Out of Food in the Last Year: Not on file  Transportation Needs:   . Lack of Transportation (Medical): Not on file  . Lack of Transportation (Non-Medical): Not on file  Physical Activity:   . Days of Exercise per Week: Not on file  . Minutes of Exercise per Session: Not on file  Stress:   . Feeling of Stress : Not on file  Social Connections:   . Frequency of Communication with Friends and Family: Not on file  . Frequency of Social Gatherings with Friends and Family: Not on file  . Attends Religious Services: Not on file  . Active Member of Clubs or Organizations: Not  on file  . Attends Archivist Meetings: Not on file  . Marital Status: Not on file  Intimate Partner Violence:   . Fear of Current or Ex-Partner: Not on file  . Emotionally Abused: Not on file  . Physically Abused: Not on file  . Sexually Abused: Not on file    Family History  Problem Relation Age of Onset  . Breast cancer Sister 15       d.54  . Leukemia Brother 22  . Cervical cancer Sister 10  . Ovarian cancer Maternal Aunt 92       d.92s  . Breast cancer Other 45    ROS- All systems are reviewed and negative except as per the HPI above  Physical Exam: Vitals:   02/14/19 1431  BP: 116/60  Pulse: (!) 47  Weight: 76.2 kg  Height: 5' 5.5" (1.664 m)   Wt Readings from Last 3 Encounters:  02/14/19 76.2 kg  01/04/19 74.8 kg  12/15/18 76.3 kg    Labs: Lab Results  Component Value Date   NA 139 01/05/2019   K 3.9 01/05/2019   CL 98 01/05/2019   CO2 27 01/05/2019   GLUCOSE 95 01/05/2019   BUN 18 01/05/2019   CREATININE 1.00 01/05/2019   CALCIUM 9.3 01/05/2019   MG 1.9 12/15/2018   No results found for: INR Lab Results  Component Value Date   CHOL 135 12/11/2018   HDL 48 12/11/2018   LDLCALC 71 12/11/2018   TRIG 81 12/11/2018     GEN- The patient is well appearing, alert and oriented x 3 today.   Head-  normocephalic, atraumatic Eyes-  Sclera clear, conjunctiva pink Ears- hearing intact Oropharynx- clear Neck- supple, no JVP Lymph- no cervical lymphadenopathy Lungs- Clear to ausculation bilaterally, normal work of breathing Heart- Regular rate and rhythm, no murmurs, rubs or gallops, PMI not laterally displaced GI- soft, NT, ND, + BS Extremities- no clubbing, cyanosis, or edema MS- no significant deformity or atrophy Skin- no rash or lesion Psych- euthymic mood, full affect Neuro- strength and sensation are intact  EKG-Sinus brady at 47 bpm with first degree AV block, pr int 238 ms, qrs int 96 ms, qtc 414 ms Epic records reviewed    Assessment and Plan: 1. New onset afib Now in SR after amiodarone loading Continue 200 mg daily Has noted a small hand tremor  Will continue to watch Will reduce metoprolol succinate 50 mg to 25 mg daily for bradycardia   2. HTN  Stable   3. CHA2DS2VASc score of 5 Continue eliquis 5 mg bid   Will request f/u with Dr. Caryl Comes in the next month   Butch Penny C. , [facility] [address] [place], [place] [address] [phone]

## 2019-02-16 ENCOUNTER — Telehealth: Payer: Self-pay | Admitting: Internal Medicine

## 2019-02-16 NOTE — Telephone Encounter (Signed)
   Primary Cardiologist: Elouise Munroe, MD  Chart reviewed as part of pre-operative protocol coverage. Patient admitted in 12/2018 for new onset atrial fibrillation and non-sustained VT and is being treated with Amiodarone and Metoprolol. She was recently seen by [name] on 02/14/2019 and was in sinus rhythm at that time with rate in the high 40's. Metoprolol was decreased. Patient was contacted today in regards to pre-operarive risk assessment and reports that she is doing well. She denies any palpitations, lightheadedness, dizziness, syncope, chest pain, shortness of breath, orthopnea, PND, or edema. Able to complete > 4.0 METS without any anginal symptoms.   Given past medical history and time since last visit, based on ACC/AHA guidelines, Cynthia [name] would be at acceptable risk for the planned procedure without further cardiovascular testing.   Per pharmacy and office protocol, "patient can hold Eliquis for 2 days prior to procedure." This should be restarted as soon as possible after procedure.   Given history of atrial fibrillation and non-sustained VT, recommend monitoring patient on telemetry throughout the peri-operative period. Also recommend continuing Amiodarone and Metoprolol in an effort to prevent any arrhythmias.   I will route this recommendation to the requesting party via Epic fax function and remove from pre-op pool.  Please call with questions.  Darreld Mclean, PA-C 02/16/2019, 11:46 AM

## 2019-02-16 NOTE — Telephone Encounter (Signed)
Patient with diagnosis of afib on Eliquis for anticoagulation.    Procedure: Cystoscopy, Ureteroscopy with possible Biopsy and possible Stent placement  Date of procedure: TBD  CHADS2-VASc score of  5 (HTN, AGE, DM2, AGE, female)  CrCl 48 ml/min  Per office protocol, patient can hold Eliquis for 2 days prior to procedure.

## 2019-02-16 NOTE — Telephone Encounter (Signed)
   [name] Pre-operative Risk Assessment    Request for surgical clearance:  1. What type of surgery is being performed? Cystoscopy, Ureteroscopy with possible Biopsy and possible Stent placement   2. When is this surgery scheduled? TBD   3. What type of clearance is required (medical clearance vs. Pharmacy clearance to hold med vs. Both)? Both   4. Are there any medications that need to be held prior to surgery and how long? Eliquis 2-3 days prior    5. Practice name and name of physician performing surgery? [facility], Dr. Harrell Gave Cynthia [name]   6. What is your office phone number [phone]   7.   What is your office fax number [phone]  8.   Anesthesia type (None, local, MAC, general) ? General    Cynthia [name] 02/16/2019, 9:09 AM  _________________________________________________________________   (provider comments below)

## 2019-02-21 ENCOUNTER — Other Ambulatory Visit: Payer: Self-pay | Admitting: Urology

## 2019-02-22 ENCOUNTER — Other Ambulatory Visit: Payer: Self-pay

## 2019-02-22 ENCOUNTER — Encounter (HOSPITAL_BASED_OUTPATIENT_CLINIC_OR_DEPARTMENT_OTHER): Payer: Self-pay | Admitting: Urology

## 2019-02-22 NOTE — Progress Notes (Addendum)
ADDENDUM:  Chart reviewed by anesthesia, Konrad Felix PA,  Ok to proceed barring acute status change, see Janett Billow progress note.  Refer to cardiac clearance note recommendation monitoring pt on telemetry throughout the peri-operative period due to Afib/ NSVT.   Spoke w/ via phone for pre-op interview--- PT Lab needs dos----  Istat 8             Lab results------ current ekg in chart/ epic COVID test ------ 02-25-2019 @ 1440  Arrive at ------- 0915 NPO after ------ MN Medications to take morning of surgery ----- Amiodarone, Toprol, Arimidex, Synthroid, and Pepcid w/ sips of water  Diabetic medication ----- do not take metformin morning of surgery Patient Special Instructions ----- no  Pre-Op special Istructions ----- pt has cardiac telephone clearance dated 02-16-2019 in epic/ chart.  Patient verbalized understanding of instructions that were given at this phone interview. Patient denies shortness of breath, chest pain, fever, cough a this phone interview.   Anesthesia Review:  New dx PAF w/ NSVT, 12-10-2018 (refer to admission in epic). HTN, DM2, Breast Cancer 2018 s/p sx and IMRT.  Pt denies any cardiac issues and no peripheral swelling.  PCP:  Dr [name]  Cardiologist :  Dr Margaretann Loveless ([place] 01-04-2019 epic)  Also followed AFib clinic ([place] 02-14-2019 epic) Oncologist:  Dr [name] ([place] 09-22-2018 epic)  Chest x-ray :  12-10-2018 epic EKG : 02-14-2019 epic Echo :  12-11-2018 epic Stress test:  Pt stated has never had a stress test Cardiac Cath :   no Sleep Study/ CPAP :  NO Fasting Blood Sugar :   / Checks Blood Sugar -- times a day:    Per pt does not check sugar at home  Blood Thinner/ Instructions Maryjane Hurter Dose: Eliquis/  Managed by cardiology,  Telephone clearance by Sande Rives PA dated 02-16-2019 in epic, may stop 2 days prior/  Per pt was given instructions by Dr [name] office to stop 2 days prior and pt stated last dose will be evening 02-26-2019 ASA / Instructions/  Last Dose :   NO

## 2019-02-23 NOTE — Progress Notes (Signed)
Anesthesia Chart Review   Case: [id number] Date/Time: 03/01/19 1100   Procedure: CYSTOSCOPY/LEFT RETROGRADE/URETEROSCOPY/ POSSIBLE BIOPSY/ POSSIBLE STENT (N/A )   Anesthesia type: General   Pre-op diagnosis: LEFT RENAL PELVIS MASS   Location: [facility] OR ROOM 2 / [place]   Surgeons: Ceasar Mons, MD      DISCUSSION:74 y.o. never smoker with h/o PAF (on Eliquis), HTN, GERD, DM II, hypothyroidism, MVP, right breast cancer s/p lumpectomy and radiation 2018, left renal pelvis mass scheduled for above procedure 03/01/19 with Dr. Harrell Gave [name].   Cleared by cardiology 02/16/19.  Per Sande Rives, PA-C, "Patient admitted in 12/2018 for new onset atrial fibrillation and non-sustained VT and is being treated with Amiodarone and Metoprolol. She was recently seen by the [facility] on 02/14/2019 and was in sinus rhythm at that time with rate in the high 40's. Metoprolol was decreased. Patient was contacted today in regards to pre-operarive risk assessment and reports that she is doing well. She denies any palpitations, lightheadedness, dizziness, syncope, chest pain, shortness of breath, orthopnea, PND, or edema. Able to complete > 4.0 METS without any anginal symptoms.  Given past medical history and time since last visit, based on ACC/AHA guidelines, Cynthia [name] would be at acceptable risk for the planned procedure without further cardiovascular testing.  Per pharmacy and office protocol, "patient can holdEliquisfor 2days prior to procedure." This should be restarted as soon as possible after procedure.  Given history of atrial fibrillation and non-sustained VT, recommend monitoring patient on telemetry throughout the peri-operative period. Also recommend continuing Amiodarone and Metoprolol in an effort to prevent any arrhythmias."  Anticipate pt can proceed with planned procedure barring acute status change.   VS: Ht 5' 5.5" (1.664 m)   Wt 73.5 kg   BMI  26.55 kg/m   PROVIDERS: [name], MD is PCP   Cherlynn Kaiser, MD is Cardiologist  LABS: Labs DOS (all labs ordered are listed, but only abnormal results are displayed)  Labs Reviewed - No data to display   IMAGES:   EKG: 02/14/19 Rate 47 bpm  Sinus bradycardia with 1st degree A-V block Incomplete right bundle branch block Borderline ECG Since the last tracing the rate is slower No significant change since last tracing   CV: Echo 12/11/2018 IMPRESSIONS    1. Left ventricular ejection fraction, by visual estimation, is 50 to  55%. The left ventricle has normal function. There is no left ventricular  hypertrophy.  2. Global right ventricle has normal systolic function.The right  ventricular size is normal. No increase in right ventricular wall  thickness.  3. Left atrial size was normal.  4. Right atrial size was normal.  5. Mild mitral valve prolapse.  6. The mitral valve is normal in structure. Mild mitral valve  regurgitation. No evidence of mitral stenosis.  7. The tricuspid valve is normal in structure. Tricuspid valve  regurgitation is trivial.  8. The aortic valve is normal in structure. Aortic valve regurgitation is  trivial. Mild aortic valve sclerosis without stenosis.  9. The pulmonic valve was normal in structure. Pulmonic valve  regurgitation is trivial.  10. TR signal is inadequate for assessing pulmonary artery systolic  pressure.  11. The inferior vena cava is normal in size with greater than 50%  respiratory variability, suggesting right atrial pressure of 3 mmHg. Past Medical History:  Diagnosis Date  . Anticoagulant long-term use    eliquis--- managed by cardiology  . Anxiety   . Arthritis   .  Bradycardia   . DDD (degenerative disc disease), lumbar   . Depression   . First degree heart block   . Genetic testing 06/25/2016   Ms. Nazir underwent genetic counseling and testing for hereditary cancer syndromes on 06/17/2016. Her  results were negative for mutations in all 46 genes analyzed by [facility]'s 46-gene Common Hereditary Cancers Panel. Genes analyzed include: APC, ATM, AXIN2, BARD1, BMPR1A, BRCA1, BRCA2, BRIP1, CDH1, CDKN2A, CHEK2, CTNNA1, DICER1, EPCAM, GREM1, HOXB13, KIT, MEN1, MLH1, MSH2, MSH3, MSH6, MUTYH, NBN,  . GERD (gastroesophageal reflux disease)    occasional,  takes pepcid  . Hematuria   . History of radiation therapy 05/22/2016 to 06/20/2016   right breast cancer  . Hypertension    followd by pcp---  (02-22-2019 per pt never had stress test)  . Hypothyroidism    followed by pcp  . IBS (irritable bowel syndrome)   . Incomplete right bundle branch block   . Insomnia   . Malignant neoplasm of upper-inner quadrant of right breast in female, estrogen receptor positive [facility]) oncologist--- dr [name]   dx 03/ 2018,  Stage IA, IDC, ER/PR +;  04-01-2016 s/p  right breast lumpectomy w/ node dissection's;  completed radiation 06-20-2016  . MVP (mitral valve prolapse)    per echo 12-11-2018 in epic, mild   . NSVT (nonsustained ventricular tachycardia) ([place]) followed by cardiology   12-10-2018  hospital admission ,  refer to discharge note 12-14-2018 for treatement  . PAF (paroxysmal atrial fibrillation) [facility]) primary cardiologist--- dr Margaretann Loveless   newly dx 12-10-2018  admission in epic, Afib w/ RVR and NSVT;   in epic TTE 12-11-2018 showed ef 50-55%, mild MVP,  mild AV sclerosis without stenosis;   Cardiac MRI 12-13-2018 in epic  . Renal mass, left    pelvis   . Restless leg syndrome   . Type 2 diabetes mellitus ([place])    followed by pcp---  (02-22-2019 does not check blood sugar's)    Past Surgical History:  Procedure Laterality Date  . BREAST LUMPECTOMY WITH RADIOACTIVE SEED AND SENTINEL LYMPH NODE BIOPSY Right 04/01/2016   Procedure: RADIOACTIVE SEED GUIDED RIGHT BREAST LUMPECTOMY WITH RIGHT AXILLARY SENTINEL LYMPH NODE BIOPSY.;  Surgeon: Fanny Skates, MD;  Location: [place];  Service: General;   Laterality: Right;  . CATARACT EXTRACTION W/ INTRAOCULAR LENS  IMPLANT, BILATERAL  1980s  . LAPAROSCOPIC CHOLECYSTECTOMY  1990s  . TONSILLECTOMY  age 44    MEDICATIONS: No current facility-administered medications for this encounter.   Marland Kitchen ALPRAZolam (XANAX) 1 MG tablet  . amiodarone (PACERONE) 200 MG tablet  . anastrozole (ARIMIDEX) 1 MG tablet  . apixaban (ELIQUIS) 5 MG TABS tablet  . cholecalciferol (VITAMIN D3) 25 MCG (1000 UT) tablet  . Choline Fenofibrate (FENOFIBRIC ACID) 135 MG CPDR  . famotidine (PEPCID) 10 MG tablet  . ferrous sulfate 325 (65 FE) MG tablet  . imipramine (TOFRANIL) 50 MG tablet  . levothyroxine (SYNTHROID, LEVOTHROID) 112 MCG tablet  . metFORMIN (GLUCOPHAGE) 500 MG tablet  . metoprolol succinate (TOPROL-XL) 25 MG 24 hr tablet  . psyllium (REGULOID) 0.52 g capsule  . traZODone (DESYREL) 150 MG tablet  . triamterene-hydrochlorothiazide (MAXZIDE-25) 37.5-25 MG tablet   Cynthia [name] [facility] Pre-Surgical Testing [phone] 02/23/19  2:50 PM

## 2019-02-25 ENCOUNTER — Inpatient Hospital Stay (HOSPITAL_COMMUNITY): Admission: RE | Admit: 2019-02-25 | Payer: Medicare PPO | Source: Ambulatory Visit

## 2019-02-28 ENCOUNTER — Other Ambulatory Visit (HOSPITAL_COMMUNITY)
Admission: RE | Admit: 2019-02-28 | Discharge: 2019-02-28 | Disposition: A | Discharge status: Home / Self Care | Payer: Medicare PPO | Source: Ambulatory Visit | Attending: Urology | Admitting: Urology

## 2019-02-28 DIAGNOSIS — Z01812 Encounter for preprocedural laboratory examination: Secondary | ICD-10-CM | POA: Insufficient documentation

## 2019-02-28 DIAGNOSIS — Z20822 Contact with and (suspected) exposure to covid-19: Secondary | ICD-10-CM | POA: Insufficient documentation

## 2019-02-28 LAB — SARS CORONAVIRUS 2 (TAT 6-24 HRS): SARS Coronavirus 2: NEGATIVE

## 2019-02-28 NOTE — Progress Notes (Signed)
Spoke with patient and rescheduled covid test for today, patient to call connie at [facility] and let her know she stopped plavix 02-27-2019

## 2019-02-28 NOTE — Progress Notes (Signed)
Spoke with patient by phone and made aware new surgery date is 03-02-2019, npo after midnight arrive 630 am follow all other pre op instructions given.

## 2019-03-01 NOTE — Anesthesia Preprocedure Evaluation (Addendum)
Anesthesia Evaluation  Patient identified by MRN, date of birth, ID band Patient awake    Reviewed: Allergy & Precautions, NPO status , Patient's Chart, lab work & pertinent test results  History of Anesthesia Complications Negative for: history of anesthetic complications  Airway Mallampati: II   Neck ROM: Full    Dental no notable dental hx. (+) Dental Advisory Given   Pulmonary neg pulmonary ROS,    Pulmonary exam normal        Cardiovascular hypertension, Normal cardiovascular exam+ dysrhythmias Atrial Fibrillation   12/20 Echo- Normal EF. Valves okay   Neuro/Psych PSYCHIATRIC DISORDERS Anxiety Depression RLS    GI/Hepatic Neg liver ROS, GERD  Medicated and Controlled,IBS   Endo/Other  diabetes, Oral Hypoglycemic AgentsHypothyroidism   Renal/GU negative Renal ROS  negative genitourinary   Musculoskeletal  (+) Arthritis ,   Abdominal   Peds  Hematology negative hematology ROS (+)   Anesthesia Other Findings   Reproductive/Obstetrics negative OB ROS                           Anesthesia Physical Anesthesia Plan  ASA: III  Anesthesia Plan: General   Post-op Pain Management:    Induction: Intravenous  PONV Risk Score and Plan: 3 and Dexamethasone, Ondansetron, Treatment may vary due to age or medical condition and Midazolam  Airway Management Planned: LMA  Additional Equipment: None  Intra-op Plan:   Post-operative Plan: Extubation in OR  Informed Consent: I have reviewed the patients History and Physical, chart, labs and discussed the procedure including the risks, benefits and alternatives for the proposed anesthesia with the patient or authorized representative who has indicated his/her understanding and acceptance.     Dental advisory given  Plan Discussed with: CRNA and Anesthesiologist  Anesthesia Plan Comments:        Anesthesia Quick Evaluation

## 2019-03-02 ENCOUNTER — Encounter (HOSPITAL_BASED_OUTPATIENT_CLINIC_OR_DEPARTMENT_OTHER): Payer: Self-pay | Admitting: Urology

## 2019-03-02 ENCOUNTER — Ambulatory Visit (HOSPITAL_BASED_OUTPATIENT_CLINIC_OR_DEPARTMENT_OTHER)
Admission: RE | Admit: 2019-03-02 | Discharge: 2019-03-02 | Disposition: A | Discharge status: Home / Self Care | Payer: Medicare PPO | Attending: Urology | Admitting: Urology

## 2019-03-02 ENCOUNTER — Ambulatory Visit (HOSPITAL_BASED_OUTPATIENT_CLINIC_OR_DEPARTMENT_OTHER): Payer: Medicare PPO | Admitting: Physician Assistant

## 2019-03-02 ENCOUNTER — Encounter (HOSPITAL_BASED_OUTPATIENT_CLINIC_OR_DEPARTMENT_OTHER)
Admission: RE | Disposition: A | Discharge status: Home / Self Care | Payer: Self-pay | Source: Home / Self Care | Attending: Urology

## 2019-03-02 DIAGNOSIS — Z79890 Hormone replacement therapy (postmenopausal): Secondary | ICD-10-CM | POA: Insufficient documentation

## 2019-03-02 DIAGNOSIS — C652 Malignant neoplasm of left renal pelvis: Secondary | ICD-10-CM | POA: Diagnosis not present

## 2019-03-02 DIAGNOSIS — F329 Major depressive disorder, single episode, unspecified: Secondary | ICD-10-CM | POA: Insufficient documentation

## 2019-03-02 DIAGNOSIS — K219 Gastro-esophageal reflux disease without esophagitis: Secondary | ICD-10-CM | POA: Diagnosis not present

## 2019-03-02 DIAGNOSIS — Z7901 Long term (current) use of anticoagulants: Secondary | ICD-10-CM | POA: Insufficient documentation

## 2019-03-02 DIAGNOSIS — R310 Gross hematuria: Secondary | ICD-10-CM | POA: Diagnosis not present

## 2019-03-02 DIAGNOSIS — Q631 Lobulated, fused and horseshoe kidney: Secondary | ICD-10-CM | POA: Insufficient documentation

## 2019-03-02 DIAGNOSIS — Z79899 Other long term (current) drug therapy: Secondary | ICD-10-CM | POA: Insufficient documentation

## 2019-03-02 DIAGNOSIS — I10 Essential (primary) hypertension: Secondary | ICD-10-CM | POA: Diagnosis not present

## 2019-03-02 DIAGNOSIS — D4112 Neoplasm of uncertain behavior of left renal pelvis: Secondary | ICD-10-CM | POA: Diagnosis not present

## 2019-03-02 DIAGNOSIS — F419 Anxiety disorder, unspecified: Secondary | ICD-10-CM | POA: Insufficient documentation

## 2019-03-02 DIAGNOSIS — C50211 Malignant neoplasm of upper-inner quadrant of right female breast: Secondary | ICD-10-CM | POA: Insufficient documentation

## 2019-03-02 DIAGNOSIS — I480 Paroxysmal atrial fibrillation: Secondary | ICD-10-CM | POA: Diagnosis not present

## 2019-03-02 DIAGNOSIS — E039 Hypothyroidism, unspecified: Secondary | ICD-10-CM | POA: Diagnosis not present

## 2019-03-02 DIAGNOSIS — Z79811 Long term (current) use of aromatase inhibitors: Secondary | ICD-10-CM | POA: Diagnosis not present

## 2019-03-02 DIAGNOSIS — N2889 Other specified disorders of kidney and ureter: Secondary | ICD-10-CM | POA: Diagnosis not present

## 2019-03-02 DIAGNOSIS — Z923 Personal history of irradiation: Secondary | ICD-10-CM | POA: Insufficient documentation

## 2019-03-02 DIAGNOSIS — E119 Type 2 diabetes mellitus without complications: Secondary | ICD-10-CM | POA: Insufficient documentation

## 2019-03-02 DIAGNOSIS — G4700 Insomnia, unspecified: Secondary | ICD-10-CM | POA: Insufficient documentation

## 2019-03-02 DIAGNOSIS — I4891 Unspecified atrial fibrillation: Secondary | ICD-10-CM | POA: Diagnosis not present

## 2019-03-02 DIAGNOSIS — Z7984 Long term (current) use of oral hypoglycemic drugs: Secondary | ICD-10-CM | POA: Diagnosis not present

## 2019-03-02 HISTORY — DX: Nonrheumatic mitral (valve) prolapse: I34.1

## 2019-03-02 HISTORY — DX: Other specified disorders of kidney and ureter: N28.89

## 2019-03-02 HISTORY — DX: Unspecified right bundle-branch block: I45.10

## 2019-03-02 HISTORY — DX: Type 2 diabetes mellitus without complications: E11.9

## 2019-03-02 HISTORY — DX: Long term (current) use of anticoagulants: Z79.01

## 2019-03-02 HISTORY — DX: Atrioventricular block, first degree: I44.0

## 2019-03-02 HISTORY — DX: Estrogen receptor positive status (ER+): Z17.0

## 2019-03-02 HISTORY — DX: Bradycardia, unspecified: R00.1

## 2019-03-02 HISTORY — DX: Paroxysmal atrial fibrillation: I48.0

## 2019-03-02 HISTORY — DX: Hematuria, unspecified: R31.9

## 2019-03-02 HISTORY — DX: Malignant neoplasm of upper-inner quadrant of right female breast: C50.211

## 2019-03-02 HISTORY — PX: CYSTOSCOPY/RETROGRADE/URETEROSCOPY: SHX5316

## 2019-03-02 HISTORY — DX: Other ventricular tachycardia: I47.29

## 2019-03-02 HISTORY — DX: Ventricular tachycardia: I47.2

## 2019-03-02 LAB — POCT I-STAT, CHEM 8
BUN: 21 mg/dL (ref 8–23)
Calcium, Ion: 1.23 mmol/L (ref 1.15–1.40)
Chloride: 99 mmol/L (ref 98–111)
Creatinine, Ser: 1 mg/dL (ref 0.44–1.00)
Glucose, Bld: 83 mg/dL (ref 70–99)
HCT: 40 % (ref 36.0–46.0)
Hemoglobin: 13.6 g/dL (ref 12.0–15.0)
Potassium: 3.7 mmol/L (ref 3.5–5.1)
Sodium: 140 mmol/L (ref 135–145)
TCO2: 30 mmol/L (ref 22–32)

## 2019-03-02 LAB — GLUCOSE, CAPILLARY: Glucose-Capillary: 75 mg/dL (ref 70–99)

## 2019-03-02 SURGERY — CYSTOSCOPY/RETROGRADE/URETEROSCOPY
Anesthesia: General

## 2019-03-02 MED ORDER — IOHEXOL 300 MG/ML  SOLN
INTRAMUSCULAR | Status: DC | PRN
  Administered 2019-03-02: 11:00:00 8 mL via URETHRAL

## 2019-03-02 MED ORDER — ONDANSETRON HCL 4 MG PO TABS: 4.0000 mg | ORAL_TABLET | Freq: Every day | ORAL | 1 refills | Status: DC | PRN

## 2019-03-02 MED ORDER — BELLADONNA ALKALOIDS-OPIUM 16.2-60 MG RE SUPP
RECTAL | Status: AC
  Filled 2019-03-02: qty 1

## 2019-03-02 MED ORDER — CEFAZOLIN SODIUM-DEXTROSE 2-4 GM/100ML-% IV SOLN
INTRAVENOUS | Status: AC
  Filled 2019-03-02: qty 100

## 2019-03-02 MED ORDER — ONDANSETRON HCL 4 MG/2ML IJ SOLN
INTRAMUSCULAR | Status: DC | PRN
  Administered 2019-03-02: 4 mg via INTRAVENOUS

## 2019-03-02 MED ORDER — ONDANSETRON HCL 4 MG/2ML IJ SOLN
4.0000 mg | Freq: Once | INTRAMUSCULAR | Status: DC | PRN
  Filled 2019-03-02: qty 2

## 2019-03-02 MED ORDER — LIDOCAINE 2% (20 MG/ML) 5 ML SYRINGE
INTRAMUSCULAR | Status: AC
  Filled 2019-03-02: qty 5

## 2019-03-02 MED ORDER — EPHEDRINE SULFATE 50 MG/ML IJ SOLN
INTRAMUSCULAR | Status: DC | PRN
  Administered 2019-03-02: 5 mg via INTRAVENOUS
  Administered 2019-03-02 (×3): 10 mg via INTRAVENOUS

## 2019-03-02 MED ORDER — CEPHALEXIN 500 MG PO CAPS: 500.0000 mg | ORAL_CAPSULE | Freq: Two times a day (BID) | ORAL | 0 refills | Status: AC

## 2019-03-02 MED ORDER — FENTANYL CITRATE (PF) 100 MCG/2ML IJ SOLN
INTRAMUSCULAR | Status: DC | PRN
  Administered 2019-03-02: 50 ug via INTRAVENOUS

## 2019-03-02 MED ORDER — SODIUM CHLORIDE 0.9 % IR SOLN
Status: DC | PRN
  Administered 2019-03-02: 3000 mL via INTRAVESICAL

## 2019-03-02 MED ORDER — PROPOFOL 10 MG/ML IV BOLUS
INTRAVENOUS | Status: AC
  Filled 2019-03-02: qty 20

## 2019-03-02 MED ORDER — ACETAMINOPHEN 500 MG PO TABS
ORAL_TABLET | ORAL | Status: AC
  Filled 2019-03-02: qty 2

## 2019-03-02 MED ORDER — ONDANSETRON HCL 4 MG/2ML IJ SOLN
INTRAMUSCULAR | Status: AC
  Filled 2019-03-02: qty 2

## 2019-03-02 MED ORDER — LACTATED RINGERS IV SOLN
INTRAVENOUS | Status: DC
  Filled 2019-03-02: qty 1000

## 2019-03-02 MED ORDER — DEXAMETHASONE SODIUM PHOSPHATE 10 MG/ML IJ SOLN
INTRAMUSCULAR | Status: DC | PRN
  Administered 2019-03-02: 5 mg via INTRAVENOUS

## 2019-03-02 MED ORDER — PROPOFOL 10 MG/ML IV BOLUS
INTRAVENOUS | Status: DC | PRN
  Administered 2019-03-02: 150 mg via INTRAVENOUS

## 2019-03-02 MED ORDER — ACETAMINOPHEN 500 MG PO TABS
1000.0000 mg | ORAL_TABLET | Freq: Once | ORAL | Status: AC
  Administered 2019-03-02: 08:00:00 1000 mg via ORAL
  Filled 2019-03-02: qty 2

## 2019-03-02 MED ORDER — FENTANYL CITRATE (PF) 100 MCG/2ML IJ SOLN
INTRAMUSCULAR | Status: AC
  Filled 2019-03-02: qty 2

## 2019-03-02 MED ORDER — LIDOCAINE 2% (20 MG/ML) 5 ML SYRINGE
INTRAMUSCULAR | Status: DC | PRN
  Administered 2019-03-02: 60 mg via INTRAVENOUS

## 2019-03-02 MED ORDER — OXYBUTYNIN CHLORIDE 5 MG PO TABS: 5.0000 mg | ORAL_TABLET | Freq: Three times a day (TID) | ORAL | 1 refills | Status: DC | PRN

## 2019-03-02 MED ORDER — HYDROCODONE-ACETAMINOPHEN 5-325 MG PO TABS: 1.0000 | ORAL_TABLET | ORAL | 0 refills | Status: DC | PRN

## 2019-03-02 MED ORDER — BELLADONNA ALKALOIDS-OPIUM 16.2-60 MG RE SUPP
RECTAL | Status: DC | PRN
  Administered 2019-03-02: 1 via RECTAL

## 2019-03-02 MED ORDER — FENTANYL CITRATE (PF) 100 MCG/2ML IJ SOLN
25.0000 ug | INTRAMUSCULAR | Status: DC | PRN
  Filled 2019-03-02: qty 1

## 2019-03-02 MED ORDER — DEXAMETHASONE SODIUM PHOSPHATE 10 MG/ML IJ SOLN
INTRAMUSCULAR | Status: AC
  Filled 2019-03-02: qty 1

## 2019-03-02 MED ORDER — CEFAZOLIN SODIUM-DEXTROSE 2-4 GM/100ML-% IV SOLN
2.0000 g | Freq: Once | INTRAVENOUS | Status: AC
  Administered 2019-03-02: 09:00:00 2 g via INTRAVENOUS
  Filled 2019-03-02: qty 100

## 2019-03-02 SURGICAL SUPPLY — 25 items
BAG DRAIN URO-CYSTO SKYTR STRL (DRAIN) ×3 IMPLANT
BASKET STONE 1.7 NGAGE (UROLOGICAL SUPPLIES) IMPLANT
BASKET ZERO TIP NITINOL 2.4FR (BASKET) ×3 IMPLANT
BENZOIN TINCTURE PRP APPL 2/3 (GAUZE/BANDAGES/DRESSINGS) IMPLANT
CATH URET 5FR 28IN OPEN ENDED (CATHETERS) IMPLANT
CLOSURE WOUND 1/2 X4 (GAUZE/BANDAGES/DRESSINGS)
CLOTH BEACON ORANGE TIMEOUT ST (SAFETY) ×3 IMPLANT
FIBER LASER FLEXIVA 365 (UROLOGICAL SUPPLIES) IMPLANT
FIBER LASER TRAC TIP (UROLOGICAL SUPPLIES) IMPLANT
GLOVE BIO SURGEON STRL SZ7.5 (GLOVE) ×3 IMPLANT
GOWN STRL REUS W/TWL XL LVL3 (GOWN DISPOSABLE) ×3 IMPLANT
GUIDEWIRE STR DUAL SENSOR (WIRE) ×2 IMPLANT
GUIDEWIRE ZIPWRE .038 STRAIGHT (WIRE) ×5 IMPLANT
IV NS IRRIG 3000ML ARTHROMATIC (IV SOLUTION) ×3 IMPLANT
KIT TURNOVER CYSTO (KITS) ×3 IMPLANT
MANIFOLD NEPTUNE II (INSTRUMENTS) ×3 IMPLANT
NS IRRIG 500ML POUR BTL (IV SOLUTION) ×3 IMPLANT
PACK CYSTO (CUSTOM PROCEDURE TRAY) ×3 IMPLANT
SHEATH URETERAL 12FRX28CM (UROLOGICAL SUPPLIES) ×2 IMPLANT
STENT URET 6FRX24 CONTOUR (STENTS) ×2 IMPLANT
STRIP CLOSURE SKIN 1/2X4 (GAUZE/BANDAGES/DRESSINGS) IMPLANT
SYR 10ML LL (SYRINGE) ×3 IMPLANT
TUBE CONNECTING 12'X1/4 (SUCTIONS)
TUBE CONNECTING 12X1/4 (SUCTIONS) IMPLANT
TUBING UROLOGY SET (TUBING) ×3 IMPLANT

## 2019-03-02 NOTE — Discharge Instructions (Signed)
CYSTOSCOPY HOME CARE INSTRUCTIONS  Activity: Rest for the remainder of the day.  Do not drive or operate equipment today.  You may resume normal activities in one to two days as instructed by your physician.   Meals: Drink plenty of liquids and eat light foods such as gelatin or soup this evening.  You may return to a normal meal plan tomorrow.  Return to Work: You may return to work in one to two days or as instructed by your physician.  Special Instructions / Symptoms: Call your physician if any of these symptoms occur:   -persistent or heavy bleeding  -bleeding which continues after first few urination  -large blood clots that are difficult to pass  -urine stream diminishes or stops completely  -fever equal to or higher than 101 degrees Farenheit.  -cloudy urine with a strong, foul odor  -severe pain  Females should always wipe from front to back after elimination.  You may feel some burning pain when you urinate.  This should disappear with time.  Applying moist heat to the lower abdomen or a hot tub bath may help relieve the pain. \  Follow-Up / Date of Return Visit to Your Physician:  As instructed Post Anesthesia Home Care Instructions  Activity: Get plenty of rest for the remainder of the day. A responsible individual must stay with you for 24 hours following the procedure.  For the next 24 hours, DO NOT: -Drive a car -Operate machinery -Drink alcoholic beverages -Take any medication unless instructed by your physician -Make any legal decisions or sign important papers.  Meals: Start with liquid foods such as gelatin or soup. Progress to regular foods as tolerated. Avoid greasy, spicy, heavy foods. If nausea and/or vomiting occur, drink only clear liquids until the nausea and/or vomiting subsides. Call your physician if vomiting continues.  Special Instructions/Symptoms: Your throat may feel dry or sore from the anesthesia or the breathing tube placed in your throat  during surgery. If this causes discomfort, gargle with warm salt water. The discomfort should disappear within 24 hours.  If you had a scopolamine patch placed behind your ear for the management of post- operative nausea and/or vomiting:  1. The medication in the patch is effective for 72 hours, after which it should be removed.  Wrap patch in a tissue and discard in the trash. Wash hands thoroughly with soap and water. 2. You may remove the patch earlier than 72 hours if you experience unpleasant side effects which may include dry mouth, dizziness or visual disturbances. 3. Avoid touching the patch. Wash your hands with soap and water after contact with the patch.     Call for an appointment to arrange follow-up.  Patient Signature:  ________________________________________________________  Nurse's Signature:  ________________________________________________________  

## 2019-03-02 NOTE — Anesthesia Postprocedure Evaluation (Signed)
Anesthesia Post Note  Patient: Cynthia [name]  Procedure(s) Performed: CYSTOSCOPY/LEFT RETROGRADE/URETEROSCOPY/  BIOPSY/  STENT (N/A )     Patient location during evaluation: PACU Anesthesia Type: General Level of consciousness: awake and alert, oriented and patient cooperative Pain management: pain level controlled Vital Signs Assessment: post-procedure vital signs reviewed and stable Respiratory status: spontaneous breathing, nonlabored ventilation and respiratory function stable Cardiovascular status: blood pressure returned to baseline and stable Postop Assessment: no apparent nausea or vomiting Anesthetic complications: no    Last Vitals:  Vitals:   03/02/19 0917 03/02/19 0930  BP: 138/74 129/65  Pulse: (!) 56 (!) 55  Resp: 14 14  Temp: (!) 36.3 C   SpO2:  100%    Last Pain:  Vitals:   03/02/19 0945  TempSrc:   PainSc: 0-No pain                 Pervis Hocking

## 2019-03-02 NOTE — Anesthesia Procedure Notes (Signed)
Procedure Name: LMA Insertion Performed by: West Pugh, CRNA Pre-anesthesia Checklist: Patient identified, Emergency Drugs available, Suction available, Patient being monitored and Timeout performed Patient Re-evaluated:Patient Re-evaluated prior to induction Oxygen Delivery Method: Circle system utilized Preoxygenation: Pre-oxygenation with 100% oxygen Induction Type: IV induction Ventilation: Mask ventilation without difficulty LMA: LMA inserted LMA Size: 4.0 Number of attempts: 1 Placement Confirmation: positive ETCO2 and breath sounds checked- equal and bilateral Tube secured with: Tape Dental Injury: Teeth and Oropharynx as per pre-operative assessment

## 2019-03-02 NOTE — Op Note (Signed)
Operative Note  Preoperative diagnosis:  1.  Solid and enhancing left renal pelvis mass 2.  Gross hematuria 3.  Horseshoe kidney  Postoperative diagnosis: 1.  Papillary left renal pelvis mass extensively involving the upper pole calyces 2.  Gross hematuria 3.  Horseshoe kidney  Procedure(s): 1.  Cystoscopy with diagnostic left ureteroscopy, left renal pelvis mass biopsy and left JJ stent placement 2.  Left retrograde pyelogram with intraoperative interpretation of fluoroscopic imaging  Surgeon: Ellison Hughs, MD  Assistants:  None  Anesthesia:  General  Complications:  None  EBL: Less than 5 mL  Specimens: 1.  Left renal pelvis mass biopsies 2.  Left renal pelvis washings  Drains/Catheters: 1.  Left 6 French, 24 cm JJ stent without tether  Intraoperative findings:   1. Extensive papillary tumor involving the upper pole calyces of the left horseshoe kidney 2. Left retrograde pyelogram revealed no filling defects along the entire length of the left ureter, but did demonstrate filling defects within the upper pole calyces of the left renal pelvis, consistent with the findings seen on recent cross-sectional imaging  Indication:  Cynthia [name] is a 74 y.o. female with a history of gross hematuria.  She had a CT urogram on 02/10/2019 that demonstrated a solid and enhancing left renal pelvis mass  with features concerning for urothelial carcinomaalong with a horseshoe kidney.  She has been consented for the above procedures, voices understanding and wishes to proceed.  Description of procedure:  After informed consent was obtained, the patient was brought to the operating room and general LMA anesthesia was administered. The patient was then placed in the dorsolithotomy position and prepped and draped in the usual sterile fashion. A timeout was performed. A 23 French rigid cystoscope was then inserted into the urethral meatus and advanced into the bladder under direct vision. A  complete bladder survey revealed no intravesical pathology.  A 5 French ureteral catheter was then inserted into the left ureteral orifice and a retrograde pyelogram was obtained, with the findings listed above.  A Glidewire was then used to intubate the lumen of the ureteral catheter and was advanced up to the left renal pelvis, under fluoroscopic guidance.  The catheter was then removed, leaving the wire in place.  An additional sensor wire was then advanced up the left ureter, under fluoroscopic guidance.  A 14 French, 24 cm ureteral access sheath was then advanced over the sensor wire and into position within the proximal aspects of the left ureter.  A flexible ureteroscope was then navigated into the left renal pelvis where multiple papillary tumors were seen extensively involving the uppermost left renal calyces, with gross features concerning for urothelial carcinoma.  A total of 2 biopsies were taken of the papillary mass and sent to pathology for permanent section.  Left renal pelvis washings were also obtained.  The flexible ureteroscope was then removed under direct vision, revealing no additional lesions or tumor involving the left ureter.  A 6 French, 24 cm JJ stent was then placed over the working wire and into position within the left collecting system, confirming placement via fluoroscopy.  Her bladder was then drained.  She tolerated the procedure well and was transferred to the postanesthesia in stable condition.  Plan:  Follow up in one week for office cystoscopy and stent removal and to discuss her pathology results.

## 2019-03-02 NOTE — Transfer of Care (Addendum)
Immediate Anesthesia Transfer of Care Note  Patient: Cynthia [name]  Procedure(s) Performed: CYSTOSCOPY/LEFT RETROGRADE/URETEROSCOPY/  BIOPSY/  STENT (N/A )  Patient Location: PACU  Anesthesia Type:General  Level of Consciousness: awake, alert , oriented and patient cooperative  Airway & Oxygen Therapy: Patient Spontanous Breathing and Patient connected to nasal cannula oxygen  Post-op Assessment: Report given to RN and Post -op Vital signs reviewed and stable  Post vital signs: Reviewed and stable  Last Vitals:  Vitals Value Taken Time  BP 138/74 03/02/19 0919  Temp    Pulse 56 03/02/19 0924  Resp 14 03/02/19 0924  SpO2 100 % 03/02/19 0924  Vitals shown include unvalidated device data.  Last Pain:  Vitals:   03/02/19 0729  TempSrc: Oral  PainSc: 0-No pain      Patients Stated Pain Goal: 2 ([id number])  Complications: No apparent anesthesia complications

## 2019-03-02 NOTE — H&P (Addendum)
Urology Preoperative H&P   Chief Complaint: left renal pelvis mass and hematuria   History of Present Illness: Cynthia [name] is a 74 y.o. female with the past medical history listed below who was initially seen for gross hematuria.  CT Urogram 02/10/19 revealed a horseshoe kidney with a solid and enhancing left renal pelvis mass with radiographic features concerning for urothelial carcinoma.  She is here today for a diagnostic ureteroscopy to assess the left renal pelvis lesion.    Past Medical History:  Diagnosis Date  . Anticoagulant long-term use    eliquis--- managed by cardiology  . Anxiety   . Arthritis   . Bradycardia   . DDD (degenerative disc disease), lumbar   . Depression   . First degree heart block   . Genetic testing 06/25/2016   Cynthia [name] underwent genetic counseling and testing for hereditary cancer syndromes on 06/17/2016. Her results were negative for mutations in all 46 genes analyzed by [facility]'s 46-gene Common Hereditary Cancers Panel. Genes analyzed include: APC, ATM, AXIN2, BARD1, BMPR1A, BRCA1, BRCA2, BRIP1, CDH1, CDKN2A, CHEK2, CTNNA1, DICER1, EPCAM, GREM1, HOXB13, KIT, MEN1, MLH1, MSH2, MSH3, MSH6, MUTYH, NBN,  . GERD (gastroesophageal reflux disease)    occasional,  takes pepcid  . Hematuria   . History of radiation therapy 05/22/2016 to 06/20/2016   right breast cancer  . Hypertension    followd by pcp---  (02-22-2019 per pt never had stress test)  . Hypothyroidism    followed by pcp  . IBS (irritable bowel syndrome)   . Incomplete right bundle branch block   . Insomnia   . Malignant neoplasm of upper-inner quadrant of right breast in female, estrogen receptor positive [facility]) oncologist--- dr [name]   dx 03/ 2018,  Stage IA, IDC, ER/PR +;  04-01-2016 s/p  right breast lumpectomy w/ node dissection's;  completed radiation 06-20-2016  . MVP (mitral valve prolapse)    per echo 12-11-2018 in epic, mild   . NSVT (nonsustained ventricular tachycardia) ([place])  followed by cardiology   12-10-2018  hospital admission ,  refer to discharge note 12-14-2018 for treatement  . PAF (paroxysmal atrial fibrillation) [facility]) primary cardiologist--- dr Margaretann Loveless   newly dx 12-10-2018  admission in epic, Afib w/ RVR and NSVT;   in epic TTE 12-11-2018 showed ef 50-55%, mild MVP,  mild AV sclerosis without stenosis;   Cardiac MRI 12-13-2018 in epic  . Renal mass, left    pelvis   . Restless leg syndrome   . Type 2 diabetes mellitus ([place])    followed by pcp---  (02-22-2019 does not check blood sugar's)    Past Surgical History:  Procedure Laterality Date  . BREAST LUMPECTOMY WITH RADIOACTIVE SEED AND SENTINEL LYMPH NODE BIOPSY Right 04/01/2016   Procedure: RADIOACTIVE SEED GUIDED RIGHT BREAST LUMPECTOMY WITH RIGHT AXILLARY SENTINEL LYMPH NODE BIOPSY.;  Surgeon: Fanny Skates, MD;  Location: [place];  Service: General;  Laterality: Right;  . CATARACT EXTRACTION W/ INTRAOCULAR LENS  IMPLANT, BILATERAL  1980s  . LAPAROSCOPIC CHOLECYSTECTOMY  1990s  . TONSILLECTOMY  age 89    Allergies: No Known Allergies  Family History  Problem Relation Age of Onset  . Breast cancer Sister 27       d.54  . Leukemia Brother 55  . Cervical cancer Sister 64  . Ovarian cancer Maternal Aunt 92       d.92s  . Breast cancer Other 59    Social History:  reports that she has never smoked. She has never used smokeless  tobacco. She reports current alcohol use. She reports that she does not use drugs.  ROS: A complete review of systems was performed.  All systems are negative except for pertinent findings as noted.  Physical Exam:  Vital signs in last 24 hours: Temp:  [97.5 F (36.4 C)] 97.5 F (36.4 C) (02/24 0729) Pulse Rate:  [60] 60 (02/24 0729) Resp:  [16] 16 (02/24 0729) BP: (137)/(74) 137/74 (02/24 0729) SpO2:  [99 %] 99 % (02/24 0729) Weight:  [74.3 kg] 74.3 kg (02/24 0729) Constitutional:  Alert and oriented, No acute distress Cardiovascular: Regular rate and rhythm,  No JVD Respiratory: Normal respiratory effort, Lungs clear bilaterally GI: Abdomen is soft, nontender, nondistended, no abdominal masses GU: No CVA tenderness Lymphatic: No lymphadenopathy Neurologic: Grossly intact, no focal deficits Psychiatric: Normal mood and affect  Laboratory Data:  Recent Labs    03/02/19 0751  HGB 13.6  HCT 40.0    Recent Labs    03/02/19 0751  NA 140  K 3.7  CL 99  GLUCOSE 83  BUN 21  CREATININE 1.00     Results for orders placed or performed during the hospital encounter of 03/02/19 (from the past 24 hour(s))  I-STAT, chem 8     Status: None   Collection Time: 03/02/19  7:51 AM  Result Value Ref Range   Sodium 140 135 - 145 mmol/L   Potassium 3.7 3.5 - 5.1 mmol/L   Chloride 99 98 - 111 mmol/L   BUN 21 8 - 23 mg/dL   Creatinine, Ser 1.00 0.44 - 1.00 mg/dL   Glucose, Bld 83 70 - 99 mg/dL   Calcium, Ion 1.23 1.15 - 1.40 mmol/L   TCO2 30 22 - 32 mmol/L   Hemoglobin 13.6 12.0 - 15.0 g/dL   HCT 40.0 36.0 - 46.0 %   Recent Results (from the past 240 hour(s))  SARS CORONAVIRUS 2 (TAT 6-24 HRS) Nasopharyngeal Nasopharyngeal Swab     Status: None   Collection Time: 02/28/19  9:31 AM   Specimen: Nasopharyngeal Swab  Result Value Ref Range Status   SARS Coronavirus 2 NEGATIVE NEGATIVE Final    Comment: (NOTE) SARS-CoV-2 target nucleic acids are NOT DETECTED. The SARS-CoV-2 RNA is generally detectable in upper and lower respiratory specimens during the acute phase of infection. Negative results do not preclude SARS-CoV-2 infection, do not rule out co-infections with other pathogens, and should not be used as the sole basis for treatment or other patient management decisions. Negative results must be combined with clinical observations, patient history, and epidemiological information. The expected result is Negative. Fact Sheet for Patients: SugarRoll.be Fact Sheet for Healthcare  Providers: https://www.woods-mathews.com/ This test is not yet approved or cleared by the [place] FDA and  has been authorized for detection and/or diagnosis of SARS-CoV-2 by FDA under an Emergency Use Authorization (EUA). This EUA will remain  in effect (meaning this test can be used) for the duration of the COVID-19 declaration under Section 56 4(b)(1) of the Act, 21 U.S.C. section 360bbb-3(b)(1), unless the authorization is terminated or revoked sooner. Performed at [facility], [place] [address], [place], [place] [address]     Renal Function: Recent Labs    03/02/19 0751  CREATININE 1.00   Estimated Creatinine Clearance: 48.8 mL/min (by C-G formula based on SCr of 1 mg/dL).  Radiologic Imaging: CLINICAL DATA: Gross hematuria  EXAM: CT ABDOMEN AND PELVIS WITHOUT AND WITH CONTRAST  TECHNIQUE: Multidetector CT imaging of the abdomen and pelvis was performed  following the standard protocol before and following the bolus administration of intravenous contrast.  CONTRAST: 125 cc Omnipaque 300  COMPARISON: None.  FINDINGS: Lower chest: Calcified subpleural nodule in the left lower lobe on image 9/3 favoring a granuloma. Mild scarring anteriorly in the right lower lobe and in the left lower lobe.  Left infrahilar calcified lymph node.  Left anterior descending and right coronary artery atherosclerotic calcification.  Hepatobiliary: Punctate calcifications in the liver compatible with old granulomatous disease. Surgically absent gallbladder.  Pancreas: Unremarkable  Spleen: Old granulomatous disease.  Adrenals/Urinary Tract: The adrenal glands appear normal.  Horseshoe kidney with parenchymal bridging in the midline.  Enhancing tumor in the left kidney upper pole collecting system tracking within a prominent calyx and within a distended infundibulum, tumor measuring about 5.0 by 1.9 by 1.5 cm in total size.  No other filling defect along  the urothelium is identified. Please note that much of the right ureter does not fill on delayed images despite repeat attempts. No other abnormal enhancement along the urothelium is present.  Stomach/Bowel: Prominent stool throughout the colon favors constipation.  Vascular/Lymphatic: Aortoiliac atherosclerotic vascular disease. No pathologic adenopathy is identified.  Reproductive: Suspected uterine fibroids. Otherwise unremarkable.  Other: No supplemental non-categorized findings.  Musculoskeletal: Grade 1 degenerative anterolisthesis at L4-5. Lumbar spondylosis and degenerative disc disease with left foraminal impingement at L1-2, L2-3, and L3-4; and right foraminal impingement at L1-2.  IMPRESSION: 1. Enhancing tumor in the left kidney upper pole collecting system extending down into the mid kidney collecting system, enhancing mass measures about 5.0 by 1.9 by 1.5 cm. 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 No adenopathy or distant metastatic lesions noted. 2. Horseshoe kidney with bridging parenchyma. 3. Other imaging findings of potential clinical significance: Old granulomatous disease. Coronary atherosclerosis. Aortic Atherosclerosis (ICD10-I70.0). Prominent stool throughout the colon favors constipation. Uterine fibroids. Multilevel lumbar impingement.   Electronically Signed By: Van Clines M.D. On: 02/10/2019 15:05  I independently reviewed the above imaging studies.  Assessment and Plan Myron Strzelecki is a 74 y.o. female with a horseshoe kidney along with a solid and enhancing left renal pelvis mass  The risks, benefits and alternatives of cystoscopy with LEFT ureteroscopy, laser lithotripsy and ureteral stent placement was discussed the patient.  Risks included, but are not limited to: bleeding, urinary tract infection, ureteral injury/avulsion, ureteral stricture formation, retained stone fragments, the possibility that multiple surgeries may  be required to treat the stone(s), MI, stroke, PE and the inherent risks of general anesthesia.  The patient voices understanding and wishes to proceed.      Ellison Hughs, MD 03/02/2019, 8:01 AM  [facility] Pager: [phone]

## 2019-03-04 LAB — CYTOLOGY - NON PAP

## 2019-03-04 LAB — SURGICAL PATHOLOGY

## 2019-03-10 DIAGNOSIS — C652 Malignant neoplasm of left renal pelvis: Secondary | ICD-10-CM | POA: Diagnosis not present

## 2019-03-10 DIAGNOSIS — Q631 Lobulated, fused and horseshoe kidney: Secondary | ICD-10-CM | POA: Diagnosis not present

## 2019-03-10 DIAGNOSIS — R310 Gross hematuria: Secondary | ICD-10-CM | POA: Diagnosis not present

## 2019-03-12 ENCOUNTER — Telehealth: Payer: Self-pay | Admitting: Physician Assistant

## 2019-03-12 NOTE — Telephone Encounter (Signed)
74 year old female with paroxysmal atrial fibrillation, ventricular tachycardia, hypertension, diabetes, breast cancer, renal carcinoma.  She is on Apixaban for anticoagulation.  She called the answering service because she stood earlier today to go to the bathroom.  This was shortly after awakening.  She felt nauseated and lightheaded/near syncopal.  She did not have syncope.  She checked her blood pressure it was 119/85.  Her recent heart rate was 69.  She has not had chest pain, shortness of breath, leg swelling, fevers, cough, melena.  She does have fairly persistent hematuria from her renal carcinoma. PLAN: She may have just experienced orthostasis from standing quickly.  It is concerning that she has had persistent hematuria and is on anticoagulation with Apixaban.  It is not clear that she had recurrent atrial fibrillation.   She is currently feeling well without any further lightheadedness.  I suspect this was just orthostasis.  Currently, her vital signs are stable.  I have advised her to push fluids today and continue to rest.  If she has recurrent symptoms or rapid palpitations, I would be concerned about significant anemia or recurrent atrial fibrillation.  At that point, I would recommend she go to the emergency room.  She verbalized understanding. I will make sure she has follow up. Richardson Dopp, PA-C    03/12/2019 3:48 PM

## 2019-03-13 ENCOUNTER — Ambulatory Visit (HOSPITAL_COMMUNITY)
Admission: EM | Admit: 2019-03-13 | Discharge: 2019-03-13 | Disposition: A | Discharge status: Home / Self Care | Payer: Medicare PPO | Attending: Emergency Medicine | Admitting: Emergency Medicine

## 2019-03-13 ENCOUNTER — Encounter (HOSPITAL_COMMUNITY): Payer: Self-pay | Admitting: Emergency Medicine

## 2019-03-13 ENCOUNTER — Other Ambulatory Visit: Payer: Self-pay

## 2019-03-13 ENCOUNTER — Telehealth (HOSPITAL_COMMUNITY): Payer: Self-pay | Admitting: Emergency Medicine

## 2019-03-13 DIAGNOSIS — R55 Syncope and collapse: Secondary | ICD-10-CM | POA: Diagnosis not present

## 2019-03-13 LAB — BASIC METABOLIC PANEL
Anion gap: 9 (ref 5–15)
BUN: 16 mg/dL (ref 8–23)
CO2: 29 mmol/L (ref 22–32)
Calcium: 9.4 mg/dL (ref 8.9–10.3)
Chloride: 97 mmol/L — ABNORMAL LOW (ref 98–111)
Creatinine, Ser: 0.96 mg/dL (ref 0.44–1.00)
GFR calc Af Amer: >60 mL/min (ref >60)
GFR calc non Af Amer: 57 mL/min — ABNORMAL LOW (ref >60)
Glucose, Bld: 89 mg/dL (ref 70–99)
Potassium: 4.2 mmol/L (ref 3.5–5.1)
Sodium: 135 mmol/L (ref 135–145)

## 2019-03-13 LAB — CBC
HCT: 36.4 % (ref 36.0–46.0)
Hemoglobin: 12 g/dL (ref 12.0–15.0)
MCH: 31.3 pg (ref 26.0–34.0)
MCHC: 33 g/dL (ref 30.0–36.0)
MCV: 95 fL (ref 80.0–100.0)
Platelets: 505 10*3/uL — ABNORMAL HIGH (ref 150–400)
RBC: 3.83 MIL/uL — ABNORMAL LOW (ref 3.87–5.11)
RDW: 13.7 % (ref 11.5–15.5)
WBC: 6.8 10*3/uL (ref 4.0–10.5)
nRBC: 0 % (ref 0.0–0.2)

## 2019-03-13 NOTE — ED Triage Notes (Signed)
Pt here for near syncope yesterday upon standing; pt with hx of similar and afib recently with cardioversion; pt denies feeling bad presently just wanted to have an EKG to make sure she wasn't back in afib

## 2019-03-13 NOTE — Discharge Instructions (Signed)
Please follow up with Cardiology If symptoms reoccuring, passing out follow up in emergency room  Blood work pending Continue to drink plenty of fluids

## 2019-03-13 NOTE — Telephone Encounter (Signed)
Attempted to call home and cell number to go over results from visit.  Left voicemail to return call.  CBC with stable hemoglobin, slightly elevated platelets.  Electrolytes stable, slightly low chloride, continue to drink plenty of fluids.  Glucose normal.  Labs reassuring, follow-up with PCP/cardiology as planned and for recheck of platelet levels.

## 2019-03-13 NOTE — ED Provider Notes (Signed)
[place]    CSN: [id number] Arrival date & time: 03/13/19  1523      History   Chief Complaint Chief Complaint  Patient presents with  . Near Syncope    HPI Cynthia [name] is a 74 y.o. female history of A. fib on Eliquis, hypertension, hypothyroidism, DM type II presenting today for evaluation of presyncope.  Patient states that yesterday she had an episode of presyncope upon standing.  Since that she has returned to feeling normal and denies any further episodes of syncope/lightheadedness.  She notes that she recently had a cardioversion from A. fib and is concerned about being in A. fib again.  Vital signs stable after initial incident of 116/85 and 69 which she checked at home.  Symptoms resolved after 15 to 20 minutes.  She has returned to normal and has not had recurrent sensations of lightheadedness.nausea yesterday upon waking, since has resolved. Denies chest pain, shortness of breath. Denies headache, vision changes.   HPI  Past Medical History:  Diagnosis Date  . Anticoagulant long-term use    eliquis--- managed by cardiology  . Anxiety   . Arthritis   . Bradycardia   . DDD (degenerative disc disease), lumbar   . Depression   . First degree heart block   . Genetic testing 06/25/2016   Ms. Fronek underwent genetic counseling and testing for hereditary cancer syndromes on 06/17/2016. Her results were negative for mutations in all 46 genes analyzed by [facility]'s 46-gene Common Hereditary Cancers Panel. Genes analyzed include: APC, ATM, AXIN2, BARD1, BMPR1A, BRCA1, BRCA2, BRIP1, CDH1, CDKN2A, CHEK2, CTNNA1, DICER1, EPCAM, GREM1, HOXB13, KIT, MEN1, MLH1, MSH2, MSH3, MSH6, MUTYH, NBN,  . GERD (gastroesophageal reflux disease)    occasional,  takes pepcid  . Hematuria   . History of radiation therapy 05/22/2016 to 06/20/2016   right breast cancer  . Hypertension    followd by pcp---  (02-22-2019 per pt never had stress test)  . Hypothyroidism    followed by pcp  .  IBS (irritable bowel syndrome)   . Incomplete right bundle branch block   . Insomnia   . Malignant neoplasm of upper-inner quadrant of right breast in female, estrogen receptor positive [facility]) oncologist--- dr [name]   dx 03/ 2018,  Stage IA, IDC, ER/PR +;  04-01-2016 s/p  right breast lumpectomy w/ node dissection's;  completed radiation 06-20-2016  . MVP (mitral valve prolapse)    per echo 12-11-2018 in epic, mild   . NSVT (nonsustained ventricular tachycardia) ([place]) followed by cardiology   12-10-2018  hospital admission ,  refer to discharge note 12-14-2018 for treatement  . PAF (paroxysmal atrial fibrillation) [facility]) primary cardiologist--- dr Margaretann Loveless   newly dx 12-10-2018  admission in epic, Afib w/ RVR and NSVT;   in epic TTE 12-11-2018 showed ef 50-55%, mild MVP,  mild AV sclerosis without stenosis;   Cardiac MRI 12-13-2018 in epic  . Renal mass, left    pelvis   . Restless leg syndrome   . Type 2 diabetes mellitus ([place])    followed by pcp---  (02-22-2019 does not check blood sugar's)    Patient Active Problem List   Diagnosis Date Noted  . New onset atrial fibrillation ([place]) 12/10/2018  . HTN (hypertension) 12/10/2018  . Diabetes type 2, controlled ([place]) 12/10/2018  . Hypothyroidism 12/10/2018  . Anxiety 12/10/2018  . Atrial fibrillation with RVR ([place]) 12/10/2018  . Genetic testing 06/25/2016  . Malignant neoplasm of upper-inner quadrant of right breast in female, estrogen receptor  positive ([place]) 03/18/2016    Past Surgical History:  Procedure Laterality Date  . BREAST LUMPECTOMY WITH RADIOACTIVE SEED AND SENTINEL LYMPH NODE BIOPSY Right 04/01/2016   Procedure: RADIOACTIVE SEED GUIDED RIGHT BREAST LUMPECTOMY WITH RIGHT AXILLARY SENTINEL LYMPH NODE BIOPSY.;  Surgeon: Fanny Skates, MD;  Location: [place];  Service: General;  Laterality: Right;  . CATARACT EXTRACTION W/ INTRAOCULAR LENS  IMPLANT, BILATERAL  1980s  . CYSTOSCOPY/RETROGRADE/URETEROSCOPY N/A 03/02/2019    Procedure: CYSTOSCOPY/LEFT RETROGRADE/URETEROSCOPY/  BIOPSY/  STENT;  Surgeon: Ceasar Mons, MD;  Location: [facility];  Service: Urology;  Laterality: N/A;  . LAPAROSCOPIC CHOLECYSTECTOMY  1990s  . TONSILLECTOMY  age 62    OB History   No obstetric history on file.      Home Medications    Prior to Admission medications   Medication Sig Start Date End Date Taking? Authorizing Provider  ALPRAZolam Duanne Moron) 1 MG tablet Take 1 mg by mouth at bedtime.  12/09/18   [provider]  amiodarone (PACERONE) 200 MG tablet Take 1 tablet (200 mg total) by mouth daily. Patient taking differently: Take 200 mg by mouth daily.  01/04/19   Baldwin Jamaica, PA-C  anastrozole (ARIMIDEX) 1 MG tablet Take 1 tablet (1 mg total) by mouth daily. Patient taking differently: Take 1 mg by mouth daily.  09/22/18   Magrinat, [name], MD  apixaban (ELIQUIS) 5 MG TABS tablet Take 1 tablet (5 mg total) by mouth 2 (two) times daily. Patient taking differently: Take 5 mg by mouth 2 (two) times daily.  12/15/18   Baldwin Jamaica, PA-C  cholecalciferol (VITAMIN D3) 25 MCG (1000 UT) tablet Take 1,000 Units by mouth daily.    [provider]  Choline Fenofibrate (FENOFIBRIC ACID) 135 MG CPDR Take 135 mg by mouth daily.  01/16/16   [provider]  famotidine (PEPCID) 10 MG tablet Take 10 mg by mouth 2 (two) times daily as needed for heartburn or indigestion.    [provider]  ferrous sulfate 325 (65 FE) MG tablet Take 325 mg by mouth daily with breakfast.     [provider]  HYDROcodone-acetaminophen (NORCO) 5-325 MG tablet Take 1 tablet by mouth every 4 (four) hours as needed for moderate pain. 03/02/19   Ceasar Mons, MD  imipramine (TOFRANIL) 50 MG tablet Take 50 mg by mouth at bedtime. 01/16/16   [provider]  levothyroxine (SYNTHROID, LEVOTHROID) 112 MCG tablet Take 112 mcg by mouth daily before breakfast.  03/13/16    [provider]  metFORMIN (GLUCOPHAGE) 500 MG tablet Take 500 mg by mouth 2 (two) times daily.  01/16/16   [provider]  metoprolol succinate (TOPROL-XL) 25 MG 24 hr tablet Take one tablet by mouth daily Patient taking differently: Take 25 mg by mouth daily. Take one tablet by mouth daily 02/14/19   Sherran Needs, NP  ondansetron [facility]) 4 MG tablet Take 1 tablet (4 mg total) by mouth daily as needed for nausea or vomiting. 03/02/19 03/01/20  Ceasar Mons, MD  oxybutynin (DITROPAN) 5 MG tablet Take 1 tablet (5 mg total) by mouth every 8 (eight) hours as needed for bladder spasms. 03/02/19   Ceasar Mons, MD  psyllium (REGULOID) 0.52 g capsule Take 2.6-3.12 g by mouth 2 (two) times daily.     [provider]  traZODone (DESYREL) 150 MG tablet Take 150 mg by mouth at bedtime. 03/01/16   [provider]  triamterene-hydrochlorothiazide (MAXZIDE-25) 37.5-25 MG tablet  Take 1 tablet by mouth daily.  01/24/16   [provider]    Family History Family History  Problem Relation Age of Onset  . Breast cancer Sister 35       d.54  . Leukemia Brother 24  . Cervical cancer Sister 67  . Ovarian cancer Maternal Aunt 92       d.92s  . Breast cancer Other 53    Social History Social History   Tobacco Use  . Smoking status: Never Smoker  . Smokeless tobacco: Never Used  Substance Use Topics  . Alcohol use: Yes    Comment: socially  . Drug use: Never     Allergies   Patient has no known allergies.   Review of Systems Review of Systems  Constitutional: Negative for fatigue and fever.  HENT: Negative for congestion, sinus pressure and sore throat.   Eyes: Negative for photophobia, pain and visual disturbance.  Respiratory: Negative for cough and shortness of breath.   Cardiovascular: Negative for chest pain.  Gastrointestinal: Negative for abdominal pain, nausea and vomiting.  Genitourinary: Negative for decreased  urine volume and hematuria.  Musculoskeletal: Negative for myalgias, neck pain and neck stiffness.  Neurological: Positive for light-headedness. Negative for dizziness, syncope, facial asymmetry, speech difficulty, weakness, numbness and headaches.     Physical Exam Triage Vital Signs ED Triage Vitals [03/13/19 1541]  Enc Vitals Group     BP 119/62     Pulse Rate 62     Resp 18     Temp 98.6 F (37 C)     Temp Source Oral     SpO2 97 %     Weight      Height      Head Circumference      Peak Flow      Pain Score 0     Pain Loc      Pain Edu?      Excl. in [place]?    Orthostatic VS for the past 24 hrs:  BP- Lying Pulse- Lying BP- Sitting Pulse- Sitting BP- Standing at 0 minutes Pulse- Standing at 0 minutes  03/13/19 1645 148/76 62 164/78 68 148/73 70    Updated Vital Signs BP 119/62 (BP Location: Right Arm)   Pulse 62   Temp 98.6 F (37 C) (Oral)   Resp 18   SpO2 97%    Orthostatic VS for the past 24 hrs:  BP- Lying Pulse- Lying BP- Sitting Pulse- Sitting BP- Standing at 0 minutes Pulse- Standing at 0 minutes  03/13/19 1645 148/76 62 164/78 68 148/73 70     Visual Acuity Right Eye Distance:   Left Eye Distance:   Bilateral Distance:    Right Eye Near:   Left Eye Near:    Bilateral Near:     Physical Exam Vitals and nursing note reviewed.  Constitutional:      Appearance: She is well-developed.     Comments: No acute distress  HENT:     Head: Normocephalic and atraumatic.     Nose: Nose normal.     Mouth/Throat:     Comments: Oral mucosa pink and moist, no tonsillar enlargement or exudate. Posterior pharynx patent and nonerythematous, no uvula deviation or swelling. Normal phonation.  Eyes:     Extraocular Movements: Extraocular movements intact.     Conjunctiva/sclera: Conjunctivae normal.     Pupils: Pupils are equal, round, and reactive to light.  Cardiovascular:     Rate and Rhythm: Normal rate and regular  rhythm.     Comments: No carotid bruits  auscultated Bilateral radial pulses 2+ and regular Pulmonary:     Effort: Pulmonary effort is normal. No respiratory distress.     Comments: Breathing comfortably at rest, CTABL, no wheezing, rales or other adventitious sounds auscultated  Abdominal:     General: There is no distension.  Musculoskeletal:        General: Normal range of motion.     Cervical back: Neck supple.  Skin:    General: Skin is warm and dry.  Neurological:     General: No focal deficit present.     Mental Status: She is alert and oriented to person, place, and time.     Comments: Patient A&O x3, cranial nerves II-XII grossly intact, strength at shoulders, hips and knees 5/5, equal bilaterally. Gait without abnormality.      UC Treatments / Results  Labs (all labs ordered are listed, but only abnormal results are displayed) Labs Reviewed  CBC - Abnormal; Notable for the following components:      Result Value   RBC 3.83 (*)    Platelets 505 (*)    All other components within normal limits  BASIC METABOLIC PANEL    EKG   Radiology No results found.  Procedures Procedures (including critical care time)  Medications Ordered in UC Medications - No data to display  Initial Impression / Assessment and Plan / UC Course  I have reviewed the triage vital signs and the nursing notes.  Pertinent labs & imaging results that were available during my care of the patient were reviewed by me and considered in my medical decision making (see chart for details).     EKG normal sinus rhythm with first-degree AV block, AV block is not new.  Does appear to have varying morphology of P waves in lead II, but this was present on prior EKG as well.  Baseline appears irregular in lead V2, but P waves present.  Do not suspect A. fib/flutter at this time.  Orthostatics stable.  Checking CBC for hemoglobin given patient's persistent hematuria from renal carcinoma, checking electrolytes and glucose.  Will call if  abnormal.  Will have follow-up with cardiology.  Discussed strict return precautions. Patient verbalized understanding and is agreeable with plan.  Final Clinical Impressions(s) / UC Diagnoses   Final diagnoses:  Near syncope     Discharge Instructions     Please follow up with Cardiology If symptoms reoccuring, passing out follow up in emergency room  Blood work pending Continue to drink plenty of fluids    ED Prescriptions    None     PDMP not reviewed this encounter.   Janith Lima, [place] 03/13/19 1733

## 2019-03-14 ENCOUNTER — Telehealth (HOSPITAL_COMMUNITY): Payer: Self-pay | Admitting: Emergency Medicine

## 2019-03-14 NOTE — Telephone Encounter (Signed)
noted 

## 2019-03-14 NOTE — Telephone Encounter (Signed)
Pt called and notified of pt lab results and provider instruction.

## 2019-03-16 ENCOUNTER — Telehealth: Payer: Self-pay | Admitting: Internal Medicine

## 2019-03-16 NOTE — Telephone Encounter (Signed)
New message   Received message from Kathleen Argue on 03.06.21 to call and get pt scheduled for an appt this week with [facility] or EP APP or afib clinic. Several attempts were made. Pt called back today stating that she went to urgent care on Sunday and had an EKG. She said there was no afib and she hasn't had any more issues. Pt states she thinks she will be ok to wait for an appt until 3/29 with Dr. Caryl Comes.

## 2019-03-22 DIAGNOSIS — C652 Malignant neoplasm of left renal pelvis: Secondary | ICD-10-CM | POA: Diagnosis not present

## 2019-03-24 ENCOUNTER — Other Ambulatory Visit: Payer: Self-pay | Admitting: Urology

## 2019-03-25 ENCOUNTER — Telehealth: Payer: Self-pay | Admitting: Internal Medicine

## 2019-03-25 NOTE — Telephone Encounter (Signed)
   [facility] Pre-operative Risk Assessment    Request for surgical clearance:  1. What type of surgery is being performed? L Kidney and Ureter Removal   2. When is this surgery scheduled? 04-27-19   3. What type of clearance is required (medical clearance vs. Pharmacy clearance to hold med vs. Both)? Both   4. Are there any medications that need to be held prior to surgery and how long? Eliquis 48 hrs prior to surgery   5. Practice name and name of physician performing surgery? Dr. Alexis Frock, [facility]  6. What is your office phone number: [phone]   7.   What is your office fax number: [phone]   8.   Anesthesia type (None, local, MAC, general) ? General    Cynthia [name] 03/25/2019, 11:09 AM  _________________________________________________________________   (provider comments below)

## 2019-03-25 NOTE — Telephone Encounter (Signed)
This patient has a follow up office appointment 3/26 after an ED visit for near syncope on 3/07. Her surgery is not until 4/21.  Clearance and anticoagulation can be addressed at the 3/26 office visit.  Kerin Ransom PA-C 03/25/2019 1:48 PM

## 2019-03-31 NOTE — Progress Notes (Addendum)
Cardiology Office Note Date:  04/01/2019  Patient ID:  Cynthia [name], Cynthia [name] [birth year], MRN [id number] PCP:  [name], MD  Cardiologist:  Dr. Margaretann Loveless (new last admission) Electrophysiologist: Dr. Caryl Comes, (new last admission)    Chief Complaint:  Pre-op clearance  History of Present Illness: Cynthia [name] is a 74 y.o. female with history of HTN, DM type 2, hypothyroidism, and breast cancer s/p lumpectomy/XRT in 2018 on anastrozole now, and anxiety, new onset AFib, VT.  12/10/2018 she was sent to the ER via her PMD where she went w/recurrent near syncope there EKG showed new Afib as well as WCT concerning for VT, she was admitted started on diltiazem for rate control in the ER and heparin gtt for anticoagulation, initial labs unrevealing.  Given WCT dilt changed to BB and amiodarone was added. She was observed on telemetry to have a post termination pause, and in AFib isolated pause 5.6 seconds.  Early, this suspect to be her pre-syncope.  Though she described minutes of feeling weak, clammy. EP was called to the case, unclear initially if VT vs aberrancy.  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 TTE noted LVEF 50-55%, no LVH, no WMA, no significant VHD. Verapamil was changed to Toprol.  She continued to have PAfib though rate controlled, no pauses or significant bradycardia. 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 (though Dr. Caryl Comes seemed unconvinced of this with plans for further d/w Dr. Gardiner Rhyme)  She was transitioned to PO amiodarone.  She had SR with PAFib though rates controlled.  She has not had further VT, no bradycardia or pauses.  She was discharged on amiodarone 445m BID until her follow up, planned for amio labs and titration at that time. No ICD was planned at this  juncture.  I saw her 01/04/2020 She felt well. No cardiac awareness at all.  No CP, palpitations or SOB.  She had not had any further weak or near syncopal episodes, never has had full syncope up to this time.  Since the hospital stay felt like she tired a bit more easy, but has no difficulties with her ADLs, no rest SOB.  She reported compliance with her Eliquis with no missed doses taking it BID.  Has been 3 weeks since her discharge on Eliquis.  She mentioned prior to the start of Eliquis about September she had noticed some blood tinged urine, and was treated for a UTI, since then has had an occasional episode of the same.  Since being on Eliquis, she has noted more frequently some blood in her urine.  No pain, no frequency or urgency, no fever or symptoms of illnes.  No frank blood, or clots. She has called her PMD for guidance, requesting urology referral.  She had not interrupted her Eliquis.  She was in a 2:1 Aflutter In d/w Dr. 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  Cystoscopy with diagnostic left ureteroscopy, left renal pelvis mass biopsy and left JJ stent placement 2.  Left retrograde  pyelogram with intraoperative interpretation of fluoroscopic imaging  03/12/2019 she called with a near syncopal event suspect to have been orthostatic occurring after standing to go to the bathroom.  She was urged to hydrate well, she was given recommendations to go to the ER if recurrent or development of palpitations, escalating symptoms in any way Dr. Caryl Comes was made aware  She went to the ER 03/13/19, with concerns she may have gone back into AFib when she had near syncope the day prior.  She had not had any  recurrent near syncope/symptoms. EKG noted SR H/H 12/36 BUN/Creat 16/0.96 K+ 4.2  She comes in today needs clearance for L nephrectomy, scheduled for 04/27/2019  She denies any exertional incapacities, no difficulties with her ADLs No CP, palpitations or cardiac awareness No syncope, no symptoms like those that sent her to the hos[pital in Dec when she was found with her arrhythmias She has had 2 episodes of lightheadedness, both occurred in the same scenario She had been sleeping, woke to use the bathroom, got up and as she is getting to the bathroom is lightheaded.  She instinctively layed on the floor feeling better, stayed there a few minutes, not because she was feeling poorly but nervous "because of what happened in December" She has a sensation of slight nausea  After one event when back to bed she checked her pulse was 123, though quickly returned to <100  She went to the [facility] the following day to reassure herself she has not gone into Afib  RCRI score is zero/0.4% DASI score 42.7/7.99METS  Past Medical History:  Diagnosis Date  . Anticoagulant long-term use    eliquis--- managed by cardiology  . Anxiety   . Arthritis   . Bradycardia   . DDD (degenerative disc disease), lumbar   . Depression   . First degree heart block   . Genetic testing 06/25/2016   Ms. Kidney underwent genetic counseling and testing for hereditary cancer syndromes on 06/17/2016. Her results were negative for mutations in all 46 genes analyzed by [facility]'s 46-gene Common Hereditary Cancers Panel. Genes analyzed include: APC, ATM, AXIN2, BARD1, BMPR1A, BRCA1, BRCA2, BRIP1, CDH1, CDKN2A, CHEK2, CTNNA1, DICER1, EPCAM, GREM1, HOXB13, KIT, MEN1, MLH1, MSH2, MSH3, MSH6, MUTYH, NBN,  . GERD (gastroesophageal reflux disease)    occasional,  takes pepcid  . Hematuria   . History of radiation therapy 05/22/2016 to 06/20/2016   right breast cancer  . Hypertension    followd by pcp---  (02-22-2019 per pt never had  stress test)  . Hypothyroidism    followed by pcp  . IBS (irritable bowel syndrome)   . Incomplete right bundle branch block   . Insomnia   . Malignant neoplasm of upper-inner quadrant of right breast in female, estrogen receptor positive [facility]) oncologist--- dr [name]   dx 03/ 2018,  Stage IA, IDC, ER/PR +;  04-01-2016 s/p  right breast lumpectomy w/ node dissection's;  completed radiation 06-20-2016  . MVP (mitral valve prolapse)    per echo 12-11-2018 in epic, mild   . NSVT (nonsustained ventricular tachycardia) ([place]) followed by cardiology   12-10-2018  hospital admission ,  refer to discharge note 12-14-2018 for treatement  . PAF (paroxysmal atrial fibrillation) [facility]) primary cardiologist--- dr Margaretann Loveless   newly dx 12-10-2018  admission in epic, Afib w/ RVR and NSVT;   in epic TTE 12-11-2018 showed ef 50-55%, mild MVP,  mild AV sclerosis without stenosis;   Cardiac MRI 12-13-2018 in epic  . Renal mass, left  pelvis   . Restless leg syndrome   . Type 2 diabetes mellitus ([place])    followed by pcp---  (02-22-2019 does not check blood sugar's)    Past Surgical History:  Procedure Laterality Date  . BREAST LUMPECTOMY WITH RADIOACTIVE SEED AND SENTINEL LYMPH NODE BIOPSY Right 04/01/2016   Procedure: RADIOACTIVE SEED GUIDED RIGHT BREAST LUMPECTOMY WITH RIGHT AXILLARY SENTINEL LYMPH NODE BIOPSY.;  Surgeon: Fanny Skates, MD;  Location: [place];  Service: General;  Laterality: Right;  . CATARACT EXTRACTION W/ INTRAOCULAR LENS  IMPLANT, BILATERAL  1980s  . CYSTOSCOPY/RETROGRADE/URETEROSCOPY N/A 03/02/2019   Procedure: CYSTOSCOPY/LEFT RETROGRADE/URETEROSCOPY/  BIOPSY/  STENT;  Surgeon: Ceasar Mons, MD;  Location: [facility];  Service: Urology;  Laterality: N/A;  . LAPAROSCOPIC CHOLECYSTECTOMY  1990s  . TONSILLECTOMY  age 30    Current Outpatient Medications  Medication Sig Dispense Refill  . ALPRAZolam (XANAX) 1 MG tablet Take 1 mg by mouth at bedtime.     Marland Kitchen  amiodarone (PACERONE) 200 MG tablet Take 1 tablet (200 mg total) by mouth daily. (Patient taking differently: Take 200 mg by mouth daily. ) 90 tablet 1  . anastrozole (ARIMIDEX) 1 MG tablet Take 1 tablet (1 mg total) by mouth daily. (Patient taking differently: Take 1 mg by mouth daily. ) 90 tablet 4  . apixaban (ELIQUIS) 5 MG TABS tablet Take 1 tablet (5 mg total) by mouth 2 (two) times daily. (Patient taking differently: Take 5 mg by mouth 2 (two) times daily. ) 60 tablet 6  . cholecalciferol (VITAMIN D3) 25 MCG (1000 UT) tablet Take 1,000 Units by mouth daily.    . Choline Fenofibrate (FENOFIBRIC ACID) 135 MG CPDR Take 135 mg by mouth daily.     . famotidine (PEPCID) 10 MG tablet Take 10 mg by mouth 2 (two) times daily as needed for heartburn or indigestion.    . ferrous sulfate 325 (65 FE) MG tablet Take 325 mg by mouth daily with breakfast.     . HYDROcodone-acetaminophen (NORCO) 5-325 MG tablet Take 1 tablet by mouth every 4 (four) hours as needed for moderate pain. 20 tablet 0  . imipramine (TOFRANIL) 50 MG tablet Take 50 mg by mouth at bedtime.    Marland Kitchen levothyroxine (SYNTHROID, LEVOTHROID) 112 MCG tablet Take 112 mcg by mouth daily before breakfast.     . metFORMIN (GLUCOPHAGE) 500 MG tablet Take 500 mg by mouth 2 (two) times daily.     . metoprolol succinate (TOPROL-XL) 25 MG 24 hr tablet Take one tablet by mouth daily (Patient taking differently: Take 25 mg by mouth daily. Take one tablet by mouth daily) 30 tablet 6  . ondansetron (ZOFRAN) 4 MG tablet Take 1 tablet (4 mg total) by mouth daily as needed for nausea or vomiting. 30 tablet 1  . oxybutynin (DITROPAN) 5 MG tablet Take 1 tablet (5 mg total) by mouth every 8 (eight) hours as needed for bladder spasms. 30 tablet 1  . psyllium (REGULOID) 0.52 g capsule Take 2.6-3.12 g by mouth 2 (two) times daily.     . traZODone (DESYREL) 150 MG tablet Take 150 mg by mouth at bedtime.    . triamterene-hydrochlorothiazide (MAXZIDE-25) 37.5-25 MG tablet  Take 1 tablet by mouth daily.      No current facility-administered medications for this visit.    Allergies:   Patient has no known allergies.   Social History:  The patient  reports that she has never smoked. She has never used smokeless tobacco. She reports  current alcohol use. She reports that she does not use drugs.   Family History:  The patient's family history includes Breast cancer (age of onset: 86) in an other family member; Breast cancer (age of onset: 41) in her sister; Cervical cancer (age of onset: 60) in her sister; Leukemia (age of onset: 13) in her brother; Ovarian cancer (age of onset: 68) in her maternal aunt.  ROS:  Please see the history of present illness.  All other systems are reviewed and otherwise negative.   PHYSICAL EXAM:  VS:  There were no vitals taken for this visit. BMI: There is no height or weight on file to calculate BMI. Well nourished, well developed, in no acute distress  HEENT: normocephalic, atraumatic  Neck: no JVD, carotid bruits or masses Cardiac:  RRR; no significant murmurs, no rubs, or gallops Lungs:  CTA, no wheezing, rhonchi or rales  Abd: soft, nontender MS: no deformity or atrophy Ext:  no edema  Skin: warm and dry, no rash Neuro:  No gross deficits appreciated Psych: euthymic mood, full affect   EKG:  Done today and reviewed by myself shows  SB 57bpm, 1st degree AVblock, PR 2351m, no significant change from prior   Mr Cardiac Morphology W Wo Contrast Result Date: 12/13/2018 CLINICAL DATA:  68F presents with AF, NSVT EXAM: CARDIAC MRI TECHNIQUE: The patient was scanned on a 1.5 Tesla Siemens magnet. A dedicated cardiac coil was used. Functional imaging was done using Fiesta sequences. 2,3, and 4 chamber views were done to assess for RWMA's. Modified Simpson's rule using a short axis stack was used to calculate an ejection fraction on a dedicated work sConservation officer, nature The patient received 8 cc of Gadavist. After 10  minutes inversion recovery sequences were used to assess for infiltration and scar tissue. CONTRAST:  8 cc  of Gadavist FINDINGS: Left ventricle: - Normal size - Mild systolic dysfunction - Ratio noncompacted to compacted myocardium > 2.3 at end-diastole along basal to apical anterior/anterolateral wall. - Inferior RV insertion site LGE LV EF:  52% (Normal 56-78%) Absolute volumes: LV EDV: 1348m(Normal 52-141 mL) LV ESV: 663mNormal 13-51 mL) LV SV: 22m10mormal 33-97 mL) CO: 4.9L/min (Normal 2.7-6.0 L/min) Indexed volumes: LV EDV: 74mL5mm (Normal 41-81 mL/sq-m) LV ESV: 36mL/5m (Normal 12-21 mL/sq-m) LV SV: 38mL/s54m(Normal 26-56 mL/sq-m) CI: 2.6L/min/sq-m (Normal 1.8-3.8 L/min/sq-m) Right ventricle: Normal size and systolic function RV EF: 55% (No02%l 47-80%) Absolute volumes: RV EDV: 131mL (N44ml 58-154 mL) RV ESV: 59mL (No65m 12-68 mL) RV SV: 72mL (Nor6m35-98 mL) CO: 5.0L/min (Normal 2.7-6 L/min) Indexed volumes: RV EDV: 70mL/sq-m 30mmal 48-87 mL/sq-m) RV ESV: 32mL/sq-m (33mal 11-28 mL/sq-m) RV SV: 39mL/sq-m (N58ml 27-57 mL/sq-m) CI: 2.7L/min/sq-m (Normal 1.8-3.8 L/min/sq-m) Left atrium: Normal size Right atrium: Mild enlargement Mitral valve: Mild regurgitation Aortic valve: No regurgitation Tricuspid valve: No regurgitation Pericardium: Small to moderate effusion, measuring up to 10mm adjacent5minferior LV wall IMPRESSION: 1. 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 Inferior RV insertion site late gadolinium enhancement, which is a nonspecific finding often seen in setting of elevated pulmonary pressures but has been described in LV noncompaction 3.  Normal LV size with mildly reduced systolic function (EF 52%) 4.  Norma58%V size and systolic function (EF 55%) 5. Small 52%moderate pericardial effusion, measuring up to 10mm adjacent 29mnferior LV wall Electronically Signed   By: Christopher  ScOswaldo Milian/2020 21:27     12/11/2018  TTE IMPRESSIONS 1. Left ventricular ejection fraction, by visual estimation, is 50 to 55%. The left ventricle has normal function. There is no left ventricular hypertrophy. 2. Global right ventricle has normal systolic function.The right ventricular size is normal. No increase in right ventricular wall thickness. 3. Left atrial size was normal. 4. Right atrial size was normal. 5. Mild mitral valve prolapse. 6. The mitral valve is normal in structure. Mild mitral valve regurgitation. No evidence of mitral stenosis. 7. The tricuspid valve is normal in structure. Tricuspid valve regurgitation is trivial. 8. The aortic valve is normal in structure. Aortic valve regurgitation is trivial. Mild aortic valve sclerosis without stenosis. 9. The pulmonic valve was normal in structure. Pulmonic valve regurgitation is trivial. 10. TR signal is inadequate for assessing pulmonary artery systolic pressure. 11. The inferior vena cava is normal in size with greater than 50% respiratory variability, suggesting right atrial pressure of 3 mmHg.   Recent Labs: 12/15/2018: Magnesium 1.9 01/05/2019: ALT 11; TSH 2.850 03/13/2019: BUN 16; Creatinine, Ser 0.96; Hemoglobin 12.0; Platelets 505; Potassium 4.2; Sodium 135  12/11/2018: Cholesterol 135; HDL 48; LDL Cholesterol 71; Total CHOL/HDL Ratio 2.8; Triglycerides 81; VLDL 16   CrCl cannot be calculated (Unknown ideal weight.).   Wt Readings from Last 3 Encounters:  03/02/19 163 lb 12.8 oz (74.3 kg)  02/14/19 168 lb (76.2 kg)  01/04/19 165 lb (74.8 kg)     Other studies reviewed: Additional studies/records reviewed today include: summarized above  ASSESSMENT AND PLAN:  1. New onset AFib w/RVR, Flutter     CHA2DS2Vasc is 4, on Eliquis,  appropriately dosed     on amiodarone   2. NSVT      LVEF normal     C.MRI noted above, ? LVNC, Dr. Caryl Comes is not convinced of this diagnosis     amiodarone     No recurrent symptoms like those in December       Normal LVEF  Lightheaded spells Both sounded orthostatic and occurred in the same context/situation She is instructed to stay adequately hydrated, not to pop up and out of bed.  To sit then stand more slowly especially after laying down for some time.  To notify if more or any escalation of change in behavior   3. HTN     No changes   4. Hematuria    Has been found with kidney mass    Pending laparoscopic nephroureterectomy 04/27/19   5. Pre-op     Not felt to be a high risk surgery, low cardiac risk score with good exertional capacity     I have discussed with Dr. Caryl Comes the case/planned surgery, OK to proceed      OK to hold her Eliquis 48 hours prior to surgery     Recommend her amiodarone and metoprolol be continued perioperatively   Disposition: See Dr. Caryl Comes in 3 mo, sooner if needed.  Will get TSH, LFTs  at her next visit   Current medicines are reviewed at length with the patient today.  The patient did not have any concerns regarding medicines.  Venetia Night, PA-C 04/01/2019 1:33 PM     [address] [place] [address] [phone] (office)  [phone] (fax)

## 2019-04-01 ENCOUNTER — Ambulatory Visit: Payer: Medicare PPO | Admitting: Physician Assistant

## 2019-04-01 VITALS — BP 140/74 | HR 57 | Ht 65.5 in | Wt 164.0 lb

## 2019-04-01 DIAGNOSIS — I472 Ventricular tachycardia, unspecified: Secondary | ICD-10-CM

## 2019-04-01 DIAGNOSIS — I10 Essential (primary) hypertension: Secondary | ICD-10-CM

## 2019-04-01 DIAGNOSIS — I480 Paroxysmal atrial fibrillation: Secondary | ICD-10-CM | POA: Diagnosis not present

## 2019-04-01 DIAGNOSIS — Z79899 Other long term (current) drug therapy: Secondary | ICD-10-CM

## 2019-04-01 DIAGNOSIS — Z01810 Encounter for preprocedural cardiovascular examination: Secondary | ICD-10-CM

## 2019-04-01 NOTE — Patient Instructions (Signed)
Medication Instructions:  Your physician recommends that you continue on your current medications as directed. Please refer to the Current Medication list given to you today.  *If you need a refill on your cardiac medications before your next appointment, please call your pharmacy*   Lab Work: [place]    If you have labs (blood work) drawn today and your tests are completely normal, you will receive your results only by: Marland Kitchen MyChart Message (if you have MyChart) OR . A paper copy in the mail If you have any lab test that is abnormal or we need to change your treatment, we will call you to review the results.   Testing/Procedures: NONE ORDERED  TODAY     Follow-Up: At [facility], you and your health needs are our priority.  As part of our continuing mission to provide you with exceptional heart care, we have created designated Provider Care Teams.  These Care Teams include your primary Cardiologist (physician) and Advanced Practice Providers (APPs -  Physician Assistants and Nurse Practitioners) who all work together to provide you with the care you need, when you need it.  We recommend signing up for the patient portal called "MyChart".  Sign up information is provided on this After Visit Summary.  MyChart is used to connect with patients for Virtual Visits (Telemedicine).  Patients are able to view lab/test results, encounter notes, upcoming appointments, etc.  Non-urgent messages can be sent to your provider as well.   To learn more about what you can do with MyChart, go to [email or web address].    Your next appointment:    3 month(s)  The format for your next appointment:   In Person  Provider:    You may see Dr. Caryl Comes      Other Instructions

## 2019-04-04 ENCOUNTER — Ambulatory Visit: Payer: Medicare PPO | Admitting: Internal Medicine

## 2019-04-07 NOTE — Patient Instructions (Addendum)
DUE TO COVID-19 ONLY ONE VISITOR IS ALLOWED TO COME WITH YOU AND STAY IN THE WAITING ROOM ONLY DURING PRE OP AND PROCEDURE DAY OF SURGERY. THE 1 VISITOR MAY VISIT WITH YOU AFTER SURGERY IN YOUR PRIVATE ROOM DURING VISITING HOURS ONLY!  YOU NEED TO HAVE A COVID 19 TEST ON: 04/23/19 @  12:20 PM_, THIS TEST MUST BE DONE BEFORE SURGERY, COME  [place], [place] , [address].  ([place]) ONCE YOUR COVID TEST IS COMPLETED, PLEASE BEGIN THE QUARANTINE INSTRUCTIONS AS OUTLINED IN YOUR HANDOUT.                Cynthia [name]    Your procedure is scheduled on: 04/27/19   Report to [facility] Main  Entrance   Report to admitting at: 10:30  AM     Call this number if you have problems the morning of surgery [phone]    Remember: Do not eat solid food :After Midnight. Clear liquid diet from midnight until 9:30 am  Your surgeon recommended to drink plenty fluids the day before surgery.   CLEAR LIQUID DIET   Foods Allowed                                                                     Foods Excluded  Coffee and tea, regular and decaf                             liquids that you cannot  Plain Jell-O any favor except red or purple                                           see through such as: Fruit ices (not with fruit pulp)                                     milk, soups, orange juice  Iced Popsicles                                    All solid food Carbonated beverages, regular and diet                                    Cranberry, grape and apple juices Sports drinks like Gatorade Lightly seasoned clear broth or consume(fat free) Sugar, honey syrup  Sample Menu Breakfast                                Lunch                                     Supper Cranberry juice  Beef broth                            Chicken broth Jell-O                                     Grape juice                           Apple juice Coffee or tea                         Jell-O                                      Popsicle                                                Coffee or tea                        Coffee or tea  _____________________________________________________________________   BRUSH YOUR TEETH MORNING OF SURGERY AND RINSE YOUR MOUTH OUT, NO CHEWING GUM CANDY OR MINTS.     Take these medicines the morning of surgery with A SIP OF WATER: amiodarone,famotadine,levothyroxine,metoprolol.Ditropan as needed.  How to Manage Your Diabetes Before and After Surgery  Why is it important to control my blood sugar before and after surgery? . Improving blood sugar levels before and after surgery helps healing and can limit problems. . A way of improving blood sugar control is eating a healthy diet by: o  Eating less sugar and carbohydrates o  Increasing activity/exercise o  Talking with your doctor about reaching your blood sugar goals . High blood sugars (greater than 180 mg/dL) can raise your risk of infections and slow your recovery, so you will need to focus on controlling your diabetes during the weeks before surgery. . Make sure that the doctor who takes care of your diabetes knows about your planned surgery including the date and location.  How do I manage my blood sugar before surgery? . Check your blood sugar at least 4 times a day, starting 2 days before surgery, to make sure that the level is not too high or low. o Check your blood sugar the morning of your surgery when you wake up and every 2 hours until you get to the Short Stay unit. . If your blood sugar is less than 70 mg/dL, you will need to treat for low blood sugar: o Do not take insulin. o Treat a low blood sugar (less than 70 mg/dL) with  cup of clear juice (cranberry or apple), 4 glucose tablets, OR glucose gel. o Recheck blood sugar in 15 minutes after treatment (to make sure it is greater than 70 mg/dL). If your blood sugar is not greater than 70 mg/dL on recheck, call  [phone] for further instructions. . Report your blood sugar to the short stay nurse when you get to Short Stay.  . If you are admitted to the hospital after surgery: o Your blood sugar will be checked by the staff and you will  probably be given insulin after surgery (instead of oral diabetes medicines) to make sure you have good blood sugar levels. o The goal for blood sugar control after surgery is 80-180 mg/dL.   WHAT DO I DO ABOUT MY DIABETES MEDICATION?  Marland Kitchen Do not take oral diabetes medicines (pills) the morning of surgery.  . THE DAY BEFORE SURGERY, take  METFORMIN AS USUAL.       . THE MORNING OF SURGERY, DO NOT TAKE METFORMIN.  DO NOT TAKE ANY DIABETIC MEDICATIONS DAY OF YOUR SURGERY                               You may not have any metal on your body including hair pins and              piercings  Do not wear jewelry, make-up, lotions, powders or perfumes, deodorant             Do not wear nail polish on your fingernails.  Do not shave  48 hours prior to surgery.           .   Do not bring valuables to the hospital. [place].  Contacts, dentures or bridgework may not be worn into surgery.  Leave suitcase in the car. After surgery it may be brought to your room.     Patients discharged the day of surgery will not be allowed to drive home. IF YOU ARE HAVING SURGERY AND GOING HOME THE SAME DAY, YOU MUST HAVE AN ADULT TO DRIVE YOU HOME AND BE WITH YOU FOR 24 HOURS. YOU MAY GO HOME BY TAXI OR UBER OR ORTHERWISE, BUT AN ADULT MUST ACCOMPANY YOU HOME AND STAY WITH YOU FOR 24 HOURS.  Name and phone number of your driver:  Special Instructions: N/A              Please read over the following fact sheets you were given: _____________________________________________________________________             [facility] - Preparing for Surgery Before surgery, you can play an important role.  Because skin is not sterile, your skin  needs to be as free of germs as possible.  You can reduce the number of germs on your skin by washing with CHG (chlorahexidine gluconate) soap before surgery.  CHG is an antiseptic cleaner which kills germs and bonds with the skin to continue killing germs even after washing. Please DO NOT use if you have an allergy to CHG or antibacterial soaps.  If your skin becomes reddened/irritated stop using the CHG and inform your nurse when you arrive at Short Stay. Do not shave (including legs and underarms) for at least 48 hours prior to the first CHG shower.  You may shave your face/neck. Please follow these instructions carefully:  1.  Shower with CHG Soap the night before surgery and the  morning of Surgery.  2.  If you choose to wash your hair, wash your hair first as usual with your  normal  shampoo.  3.  After you shampoo, rinse your hair and body thoroughly to remove the  shampoo.                           4.  Use CHG as you would any other  liquid soap.  You can apply chg directly  to the skin and wash                       Gently with a scrungie or clean washcloth.  5.  Apply the CHG Soap to your body ONLY FROM THE NECK DOWN.   Do not use on face/ open                           Wound or open sores. Avoid contact with eyes, ears mouth and genitals (private parts).                       Wash face,  Genitals (private parts) with your normal soap.             6.  Wash thoroughly, paying special attention to the area where your surgery  will be performed.  7.  Thoroughly rinse your body with warm water from the neck down.  8.  DO NOT shower/wash with your normal soap after using and rinsing off  the CHG Soap.                9.  Pat yourself dry with a clean towel.            10.  Wear clean pajamas.            11.  Place clean sheets on your bed the night of your first shower and do not  sleep with pets. Day of Surgery : Do not apply any lotions/deodorants the morning of surgery.  Please wear clean  clothes to the hospital/surgery center.  FAILURE TO FOLLOW THESE INSTRUCTIONS MAY RESULT IN THE CANCELLATION OF YOUR SURGERY PATIENT SIGNATURE_________________________________  NURSE SIGNATURE__________________________________  ________________________________________________________________________

## 2019-04-19 ENCOUNTER — Other Ambulatory Visit: Payer: Self-pay

## 2019-04-19 ENCOUNTER — Encounter (HOSPITAL_COMMUNITY): Payer: Self-pay

## 2019-04-19 ENCOUNTER — Encounter (HOSPITAL_COMMUNITY)
Admission: RE | Admit: 2019-04-19 | Discharge: 2019-04-19 | Disposition: A | Discharge status: Home / Self Care | Payer: Medicare PPO | Source: Ambulatory Visit | Attending: Urology | Admitting: Urology

## 2019-04-19 DIAGNOSIS — Z79890 Hormone replacement therapy (postmenopausal): Secondary | ICD-10-CM | POA: Diagnosis not present

## 2019-04-19 DIAGNOSIS — Z79899 Other long term (current) drug therapy: Secondary | ICD-10-CM | POA: Diagnosis not present

## 2019-04-19 DIAGNOSIS — K219 Gastro-esophageal reflux disease without esophagitis: Secondary | ICD-10-CM | POA: Insufficient documentation

## 2019-04-19 DIAGNOSIS — E039 Hypothyroidism, unspecified: Secondary | ICD-10-CM | POA: Diagnosis not present

## 2019-04-19 DIAGNOSIS — Z79811 Long term (current) use of aromatase inhibitors: Secondary | ICD-10-CM | POA: Diagnosis not present

## 2019-04-19 DIAGNOSIS — F419 Anxiety disorder, unspecified: Secondary | ICD-10-CM | POA: Insufficient documentation

## 2019-04-19 DIAGNOSIS — E119 Type 2 diabetes mellitus without complications: Secondary | ICD-10-CM | POA: Insufficient documentation

## 2019-04-19 DIAGNOSIS — I10 Essential (primary) hypertension: Secondary | ICD-10-CM | POA: Insufficient documentation

## 2019-04-19 DIAGNOSIS — Q631 Lobulated, fused and horseshoe kidney: Secondary | ICD-10-CM | POA: Diagnosis not present

## 2019-04-19 DIAGNOSIS — I480 Paroxysmal atrial fibrillation: Secondary | ICD-10-CM | POA: Diagnosis not present

## 2019-04-19 DIAGNOSIS — Z9842 Cataract extraction status, left eye: Secondary | ICD-10-CM | POA: Diagnosis not present

## 2019-04-19 DIAGNOSIS — Z01818 Encounter for other preprocedural examination: Secondary | ICD-10-CM | POA: Diagnosis not present

## 2019-04-19 DIAGNOSIS — Z7901 Long term (current) use of anticoagulants: Secondary | ICD-10-CM | POA: Diagnosis not present

## 2019-04-19 DIAGNOSIS — M1990 Unspecified osteoarthritis, unspecified site: Secondary | ICD-10-CM | POA: Insufficient documentation

## 2019-04-19 DIAGNOSIS — I341 Nonrheumatic mitral (valve) prolapse: Secondary | ICD-10-CM | POA: Insufficient documentation

## 2019-04-19 DIAGNOSIS — Z9841 Cataract extraction status, right eye: Secondary | ICD-10-CM | POA: Diagnosis not present

## 2019-04-19 DIAGNOSIS — R8271 Bacteriuria: Secondary | ICD-10-CM | POA: Diagnosis not present

## 2019-04-19 DIAGNOSIS — Z923 Personal history of irradiation: Secondary | ICD-10-CM | POA: Insufficient documentation

## 2019-04-19 DIAGNOSIS — C652 Malignant neoplasm of left renal pelvis: Secondary | ICD-10-CM | POA: Insufficient documentation

## 2019-04-19 DIAGNOSIS — Z961 Presence of intraocular lens: Secondary | ICD-10-CM | POA: Insufficient documentation

## 2019-04-19 DIAGNOSIS — F329 Major depressive disorder, single episode, unspecified: Secondary | ICD-10-CM | POA: Diagnosis not present

## 2019-04-19 DIAGNOSIS — Z7984 Long term (current) use of oral hypoglycemic drugs: Secondary | ICD-10-CM | POA: Insufficient documentation

## 2019-04-19 HISTORY — DX: Dyspnea, unspecified: R06.00

## 2019-04-19 NOTE — Progress Notes (Signed)
PCP - Dr. Justin Mend C. LOV: 04/01/19 Cardiologist - Dr. Kelvin Cellar. Clearance. 04/01/19. EPIC  Chest x-ray - 12/10/18 EKG - 07/02/19 Stress Test -  ECHO - 12/11/18 Cardiac Cath -   Sleep Study -  CPAP -   Fasting Blood Sugar -  Checks Blood Sugar _____ times a day  Blood Thinner Instructions: RN instructed pt. To contact cardiologist for instruction on Eliquis before surgery. Aspirin Instructions: Last Dose:  Anesthesia review: Hx. DIA, HTN, New Afib.  Patient denies shortness of breath, fever, cough and chest pain at PAT appointment   Patient verbalized understanding of instructions that were given to them at the PAT appointment. Patient was also instructed that they will need to review over the PAT instructions again at home before surgery.

## 2019-04-20 ENCOUNTER — Encounter (HOSPITAL_COMMUNITY)
Admission: RE | Admit: 2019-04-20 | Discharge: 2019-04-20 | Disposition: A | Discharge status: Home / Self Care | Payer: Medicare PPO | Source: Ambulatory Visit | Attending: Urology | Admitting: Urology

## 2019-04-20 DIAGNOSIS — C652 Malignant neoplasm of left renal pelvis: Secondary | ICD-10-CM | POA: Diagnosis not present

## 2019-04-20 DIAGNOSIS — I10 Essential (primary) hypertension: Secondary | ICD-10-CM | POA: Diagnosis not present

## 2019-04-20 DIAGNOSIS — Z7901 Long term (current) use of anticoagulants: Secondary | ICD-10-CM | POA: Diagnosis not present

## 2019-04-20 DIAGNOSIS — I480 Paroxysmal atrial fibrillation: Secondary | ICD-10-CM | POA: Diagnosis not present

## 2019-04-20 DIAGNOSIS — Z01818 Encounter for other preprocedural examination: Secondary | ICD-10-CM | POA: Diagnosis not present

## 2019-04-20 DIAGNOSIS — Z79899 Other long term (current) drug therapy: Secondary | ICD-10-CM | POA: Diagnosis not present

## 2019-04-20 DIAGNOSIS — E039 Hypothyroidism, unspecified: Secondary | ICD-10-CM | POA: Diagnosis not present

## 2019-04-20 DIAGNOSIS — E119 Type 2 diabetes mellitus without complications: Secondary | ICD-10-CM | POA: Diagnosis not present

## 2019-04-20 DIAGNOSIS — Q631 Lobulated, fused and horseshoe kidney: Secondary | ICD-10-CM | POA: Diagnosis not present

## 2019-04-20 LAB — HEMOGLOBIN A1C
Hgb A1c MFr Bld: 5.5 % (ref 4.8–5.6)
Mean Plasma Glucose: 111.15 mg/dL

## 2019-04-20 LAB — BASIC METABOLIC PANEL
Anion gap: 10 (ref 5–15)
BUN: 21 mg/dL (ref 8–23)
CO2: 28 mmol/L (ref 22–32)
Calcium: 9.4 mg/dL (ref 8.9–10.3)
Chloride: 102 mmol/L (ref 98–111)
Creatinine, Ser: 0.91 mg/dL (ref 0.44–1.00)
GFR calc Af Amer: >60 mL/min (ref >60)
GFR calc non Af Amer: >60 mL/min (ref >60)
Glucose, Bld: 112 mg/dL — ABNORMAL HIGH (ref 70–99)
Potassium: 4.2 mmol/L (ref 3.5–5.1)
Sodium: 140 mmol/L (ref 135–145)

## 2019-04-20 LAB — CBC
HCT: 41.9 % (ref 36.0–46.0)
Hemoglobin: 13.1 g/dL (ref 12.0–15.0)
MCH: 30.4 pg (ref 26.0–34.0)
MCHC: 31.3 g/dL (ref 30.0–36.0)
MCV: 97.2 fL (ref 80.0–100.0)
Platelets: 377 10*3/uL (ref 150–400)
RBC: 4.31 MIL/uL (ref 3.87–5.11)
RDW: 13.4 % (ref 11.5–15.5)
WBC: 5.4 10*3/uL (ref 4.0–10.5)
nRBC: 0 % (ref 0.0–0.2)

## 2019-04-20 LAB — GLUCOSE, CAPILLARY
Glucose-Capillary: 68 mg/dL — ABNORMAL LOW (ref 70–99)
Glucose-Capillary: 78 mg/dL (ref 70–99)

## 2019-04-21 NOTE — Progress Notes (Cosign Needed)
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 never smoker with h/o PAF (on Eliquis), HTN, GERD, DM II, hypothyroidism, MVP, left renal pelvis and horseshoe kidney scheduled for above procedure 04/27/2019 with Dr. Alexis Frock.   Pt seen by cardiology 04/01/2019.  Per OV note, "Not felt to be a high risk surgery, low cardiac risk score with good exertional capacity.  I have discussed with Dr. Caryl Comes the case/planned surgery, OK to proceed. OK to hold her Eliquis 48 hours prior to surgery.  Recommend her amiodarone and metoprolol be continued perioperatively"  Anticipate pt can proceed with planned procedure barring acute status change.   VS: BP (!) 143/76   Pulse 60   Temp 37 C (Oral)   Resp 18   Ht 5' 5"  (1.651 m)   Wt 73.3 kg   SpO2 98%   BMI 26.89 kg/m   PROVIDERS: [name], MD is PCP   Virl Axe, MD is Cardiologist  LABS: Labs reviewed: Acceptable for surgery. (all labs ordered are listed, but only abnormal results are displayed)  Labs Reviewed  BASIC METABOLIC PANEL - Abnormal; Notable for the following components:      Result Value   Glucose, Bld 112 (*)    All other components within normal limits  GLUCOSE, CAPILLARY - Abnormal; Notable for the following components:   Glucose-Capillary 68 (*)    All other components within normal limits  HEMOGLOBIN A1C  CBC  GLUCOSE, CAPILLARY     IMAGES:   EKG: 04/01/2019 Rate 57 bpm   CV: Echo 12/11/2018  IMPRESSIONS    1. Left ventricular ejection fraction, by visual estimation, is 50 to  55%. The left ventricle has normal function. There is no left ventricular  hypertrophy.  2. Global right ventricle has normal systolic  function.The right  ventricular size is normal. No increase in right ventricular wall  thickness.  3. Left atrial size was normal.  4. Right atrial size was normal.  5. Mild mitral valve prolapse.  6. The mitral valve is normal in structure. Mild mitral valve  regurgitation. No evidence of mitral stenosis.  7. The tricuspid valve is normal in structure. Tricuspid valve  regurgitation is trivial.  8. The aortic valve is normal in structure. Aortic valve regurgitation is  trivial. Mild aortic valve sclerosis without stenosis.  9. The pulmonic valve was normal in structure. Pulmonic valve  regurgitation is trivial.  10. TR signal is inadequate for assessing pulmonary artery systolic  pressure.  11. The inferior vena cava is normal in size with greater than 50%  respiratory variability, suggesting right atrial pressure of 3 mmHg. Past Medical History:  Diagnosis Date  . Anticoagulant long-term use    eliquis--- managed by cardiology  . Anxiety   . Arthritis   . Bradycardia   . DDD (degenerative disc disease), lumbar   . Depression   . Dyspnea    ON EXERTION  . First degree heart block   . Genetic testing 06/25/2016   Ms. Garzon underwent genetic counseling and testing for hereditary cancer syndromes on 06/17/2016. Her results were negative for mutations in all 46 genes analyzed by [facility]'s 46-gene Common Hereditary Cancers Panel. Genes analyzed include: APC,  ATM, AXIN2, BARD1, BMPR1A, BRCA1, BRCA2, BRIP1, CDH1, CDKN2A, CHEK2, CTNNA1, DICER1, EPCAM, GREM1, HOXB13, KIT, MEN1, MLH1, MSH2, MSH3, MSH6, MUTYH, NBN,  . GERD (gastroesophageal reflux disease)    occasional,  takes pepcid  . Hematuria   . History of radiation therapy 05/22/2016 to 06/20/2016   right breast cancer  . Hypertension    followd by pcp---  (02-22-2019 per pt never had stress test)  . Hypothyroidism    followed by pcp  . IBS (irritable bowel syndrome)   . Incomplete right bundle branch block   . Insomnia    . Malignant neoplasm of upper-inner quadrant of right breast in female, estrogen receptor positive [facility]) oncologist--- dr [name]   dx 03/ 2018,  Stage IA, IDC, ER/PR +;  04-01-2016 s/p  right breast lumpectomy w/ node dissection's;  completed radiation 06-20-2016  . MVP (mitral valve prolapse)    per echo 12-11-2018 in epic, mild   . NSVT (nonsustained ventricular tachycardia) ([place]) followed by cardiology   12-10-2018  hospital admission ,  refer to discharge note 12-14-2018 for treatement  . PAF (paroxysmal atrial fibrillation) [facility]) primary cardiologist--- dr Margaretann Loveless   newly dx 12-10-2018  admission in epic, Afib w/ RVR and NSVT;   in epic TTE 12-11-2018 showed ef 50-55%, mild MVP,  mild AV sclerosis without stenosis;   Cardiac MRI 12-13-2018 in epic  . Renal mass, left    pelvis   . Restless leg syndrome   . Type 2 diabetes mellitus ([place])    followed by pcp---  (02-22-2019 does not check blood sugar's)    Past Surgical History:  Procedure Laterality Date  . BREAST LUMPECTOMY WITH RADIOACTIVE SEED AND SENTINEL LYMPH NODE BIOPSY Right 04/01/2016   Procedure: RADIOACTIVE SEED GUIDED RIGHT BREAST LUMPECTOMY WITH RIGHT AXILLARY SENTINEL LYMPH NODE BIOPSY.;  Surgeon: Fanny Skates, MD;  Location: [facility];  Service: General;  Laterality: Right;  . CATARACT EXTRACTION W/ INTRAOCULAR LENS  IMPLANT, BILATERAL  1980s  . CYSTOSCOPY/RETROGRADE/URETEROSCOPY N/A 03/02/2019   Procedure: CYSTOSCOPY/LEFT RETROGRADE/URETEROSCOPY/  BIOPSY/  STENT;  Surgeon: Ceasar Mons, MD;  Location: [facility];  Service: Urology;  Laterality: N/A;  . LAPAROSCOPIC CHOLECYSTECTOMY  1990s  . TONSILLECTOMY  age 29    MEDICATIONS: . ALPRAZolam (XANAX) 1 MG tablet  . amiodarone (PACERONE) 200 MG tablet  . anastrozole (ARIMIDEX) 1 MG tablet  . apixaban (ELIQUIS) 5 MG TABS tablet  . Cholecalciferol (VITAMIN D) 50 MCG (2000 UT) CAPS  . Choline Fenofibrate (FENOFIBRIC ACID) 135 MG CPDR  .  ferrous sulfate 325 (65 FE) MG tablet  . imipramine (TOFRANIL) 50 MG tablet  . levothyroxine (SYNTHROID, LEVOTHROID) 112 MCG tablet  . metFORMIN (GLUCOPHAGE) 500 MG tablet  . metoprolol succinate (TOPROL-XL) 25 MG 24 hr tablet  . psyllium (REGULOID) 0.52 g capsule  . traZODone (DESYREL) 150 MG tablet  . triamterene-hydrochlorothiazide (MAXZIDE-25) 37.5-25 MG tablet   No current facility-administered medications for this encounter.    Maia Plan [facility] Pre-Surgical Testing [phone] 04/21/19  1:18 PM

## 2019-04-22 NOTE — Telephone Encounter (Signed)
Selita from [facility] calling to follow up on the patient's clearance. [phone]

## 2019-04-23 ENCOUNTER — Other Ambulatory Visit (HOSPITAL_COMMUNITY)
Admission: RE | Admit: 2019-04-23 | Discharge: 2019-04-23 | Disposition: A | Discharge status: Home / Self Care | Payer: Medicare PPO | Source: Ambulatory Visit | Attending: Urology | Admitting: Urology

## 2019-04-23 DIAGNOSIS — Z20822 Contact with and (suspected) exposure to covid-19: Secondary | ICD-10-CM | POA: Insufficient documentation

## 2019-04-23 DIAGNOSIS — Z01812 Encounter for preprocedural laboratory examination: Secondary | ICD-10-CM | POA: Insufficient documentation

## 2019-04-23 LAB — SARS CORONAVIRUS 2 (TAT 6-24 HRS): SARS Coronavirus 2: NEGATIVE

## 2019-04-25 NOTE — Telephone Encounter (Signed)
   Primary Cardiologist: Elouise Munroe, MD  EP: Dr. Caryl Comes  The patient was seen by Tommye Standard, PA 04/01/19 and cleared for surgery   " Not felt to be a high risk surgery, low cardiac risk score with good exertional capacity     I have discussed with Dr. Caryl Comes the case/planned surgery, OK to proceed      OK to hold her Eliquis 48 hours prior to surgery     Recommend her amiodarone and metoprolol be continued perioperatively".  I will route this recommendation to the requesting party via Epic fax function and remove from pre-op pool.  Please call with questions.  [employer], [place] 04/25/2019, 9:30 AM

## 2019-04-27 ENCOUNTER — Encounter (HOSPITAL_COMMUNITY)
Admission: RE | Disposition: A | Discharge status: Home / Self Care | Payer: Self-pay | Source: Home / Self Care | Attending: Urology

## 2019-04-27 ENCOUNTER — Inpatient Hospital Stay (HOSPITAL_COMMUNITY): Payer: Medicare PPO

## 2019-04-27 ENCOUNTER — Inpatient Hospital Stay (HOSPITAL_COMMUNITY)
Admission: RE | Admit: 2019-04-27 | Discharge: 2019-04-29 | DRG: 657 | Disposition: A | Discharge status: Home / Self Care | Payer: Medicare PPO | Attending: Urology | Admitting: Urology

## 2019-04-27 ENCOUNTER — Encounter (HOSPITAL_COMMUNITY): Payer: Self-pay | Admitting: Urology

## 2019-04-27 ENCOUNTER — Other Ambulatory Visit: Payer: Self-pay

## 2019-04-27 ENCOUNTER — Inpatient Hospital Stay (HOSPITAL_COMMUNITY): Payer: Medicare PPO | Admitting: Physician Assistant

## 2019-04-27 DIAGNOSIS — R319 Hematuria, unspecified: Secondary | ICD-10-CM | POA: Diagnosis present

## 2019-04-27 DIAGNOSIS — Z8049 Family history of malignant neoplasm of other genital organs: Secondary | ICD-10-CM | POA: Diagnosis not present

## 2019-04-27 DIAGNOSIS — I480 Paroxysmal atrial fibrillation: Secondary | ICD-10-CM | POA: Diagnosis not present

## 2019-04-27 DIAGNOSIS — Z803 Family history of malignant neoplasm of breast: Secondary | ICD-10-CM | POA: Diagnosis not present

## 2019-04-27 DIAGNOSIS — I472 Ventricular tachycardia: Secondary | ICD-10-CM | POA: Diagnosis not present

## 2019-04-27 DIAGNOSIS — K219 Gastro-esophageal reflux disease without esophagitis: Secondary | ICD-10-CM | POA: Diagnosis present

## 2019-04-27 DIAGNOSIS — Z7984 Long term (current) use of oral hypoglycemic drugs: Secondary | ICD-10-CM

## 2019-04-27 DIAGNOSIS — D4959 Neoplasm of unspecified behavior of other genitourinary organ: Secondary | ICD-10-CM | POA: Diagnosis present

## 2019-04-27 DIAGNOSIS — Z79899 Other long term (current) drug therapy: Secondary | ICD-10-CM | POA: Diagnosis not present

## 2019-04-27 DIAGNOSIS — F329 Major depressive disorder, single episode, unspecified: Secondary | ICD-10-CM | POA: Diagnosis present

## 2019-04-27 DIAGNOSIS — E039 Hypothyroidism, unspecified: Secondary | ICD-10-CM | POA: Diagnosis not present

## 2019-04-27 DIAGNOSIS — M1990 Unspecified osteoarthritis, unspecified site: Secondary | ICD-10-CM | POA: Diagnosis present

## 2019-04-27 DIAGNOSIS — Q631 Lobulated, fused and horseshoe kidney: Secondary | ICD-10-CM | POA: Diagnosis not present

## 2019-04-27 DIAGNOSIS — Z7901 Long term (current) use of anticoagulants: Secondary | ICD-10-CM

## 2019-04-27 DIAGNOSIS — I10 Essential (primary) hypertension: Secondary | ICD-10-CM | POA: Diagnosis present

## 2019-04-27 DIAGNOSIS — Z8041 Family history of malignant neoplasm of ovary: Secondary | ICD-10-CM

## 2019-04-27 DIAGNOSIS — E119 Type 2 diabetes mellitus without complications: Secondary | ICD-10-CM | POA: Diagnosis not present

## 2019-04-27 DIAGNOSIS — Z20822 Contact with and (suspected) exposure to covid-19: Secondary | ICD-10-CM | POA: Diagnosis not present

## 2019-04-27 DIAGNOSIS — F419 Anxiety disorder, unspecified: Secondary | ICD-10-CM | POA: Diagnosis present

## 2019-04-27 DIAGNOSIS — Z806 Family history of leukemia: Secondary | ICD-10-CM

## 2019-04-27 DIAGNOSIS — Z923 Personal history of irradiation: Secondary | ICD-10-CM | POA: Diagnosis not present

## 2019-04-27 DIAGNOSIS — C662 Malignant neoplasm of left ureter: Secondary | ICD-10-CM | POA: Diagnosis present

## 2019-04-27 DIAGNOSIS — C652 Malignant neoplasm of left renal pelvis: Secondary | ICD-10-CM | POA: Diagnosis not present

## 2019-04-27 DIAGNOSIS — I440 Atrioventricular block, first degree: Secondary | ICD-10-CM | POA: Diagnosis present

## 2019-04-27 DIAGNOSIS — Z7289 Other problems related to lifestyle: Secondary | ICD-10-CM

## 2019-04-27 DIAGNOSIS — Z853 Personal history of malignant neoplasm of breast: Secondary | ICD-10-CM

## 2019-04-27 HISTORY — PX: ROBOT ASSITED LAPAROSCOPIC NEPHROURETERECTOMY: SHX6077

## 2019-04-27 LAB — ABO/RH: ABO/RH(D): A NEG

## 2019-04-27 LAB — TYPE AND SCREEN
ABO/RH(D): A NEG
Antibody Screen: NEGATIVE

## 2019-04-27 LAB — GLUCOSE, CAPILLARY
Glucose-Capillary: 76 mg/dL (ref 70–99)
Glucose-Capillary: 76 mg/dL (ref 70–99)
Glucose-Capillary: 148 mg/dL — ABNORMAL HIGH (ref 70–99)

## 2019-04-27 LAB — HEMOGLOBIN AND HEMATOCRIT, BLOOD
HCT: 34.3 % — ABNORMAL LOW (ref 36.0–46.0)
Hemoglobin: 10.7 g/dL — ABNORMAL LOW (ref 12.0–15.0)

## 2019-04-27 SURGERY — NEPHROURETERECTOMY, ROBOT-ASSISTED, LAPAROSCOPIC
Anesthesia: General | Laterality: Left

## 2019-04-27 MED ORDER — LACTATED RINGERS IV SOLN: INTRAVENOUS | Status: DC

## 2019-04-27 MED ORDER — SUGAMMADEX SODIUM 200 MG/2ML IV SOLN
INTRAVENOUS | Status: DC | PRN
  Administered 2019-04-27: 300 mg via INTRAVENOUS

## 2019-04-27 MED ORDER — SODIUM CHLORIDE (PF) 0.9 % IJ SOLN
INTRAMUSCULAR | Status: DC | PRN
  Administered 2019-04-27: 20 mL

## 2019-04-27 MED ORDER — DOCUSATE SODIUM 100 MG PO CAPS
100.0000 mg | ORAL_CAPSULE | Freq: Two times a day (BID) | ORAL | Status: DC
  Administered 2019-04-27 – 2019-04-29 (×4): 100 mg via ORAL
  Filled 2019-04-27 (×4): qty 1

## 2019-04-27 MED ORDER — FENTANYL CITRATE (PF) 250 MCG/5ML IJ SOLN
INTRAMUSCULAR | Status: AC
  Filled 2019-04-27: qty 5

## 2019-04-27 MED ORDER — ACETAMINOPHEN 500 MG PO TABS
1000.0000 mg | ORAL_TABLET | Freq: Once | ORAL | Status: AC
  Administered 2019-04-27: 1000 mg via ORAL
  Filled 2019-04-27: qty 2

## 2019-04-27 MED ORDER — DIPHENHYDRAMINE HCL 12.5 MG/5ML PO ELIX: 12.5000 mg | ORAL_SOLUTION | Freq: Four times a day (QID) | ORAL | Status: DC | PRN

## 2019-04-27 MED ORDER — CEFAZOLIN SODIUM-DEXTROSE 2-4 GM/100ML-% IV SOLN
2.0000 g | INTRAVENOUS | Status: AC
  Administered 2019-04-27: 2 g via INTRAVENOUS
  Filled 2019-04-27: qty 100

## 2019-04-27 MED ORDER — BELLADONNA ALKALOIDS-OPIUM 16.2-60 MG RE SUPP: 1.0000 | Freq: Four times a day (QID) | RECTAL | Status: DC | PRN

## 2019-04-27 MED ORDER — TRAZODONE HCL 50 MG PO TABS
150.0000 mg | ORAL_TABLET | Freq: Every day | ORAL | Status: DC
  Administered 2019-04-27 – 2019-04-28 (×2): 150 mg via ORAL
  Filled 2019-04-27 (×2): qty 1

## 2019-04-27 MED ORDER — STERILE WATER FOR IRRIGATION IR SOLN
Status: DC | PRN
  Administered 2019-04-27: 1000 mL

## 2019-04-27 MED ORDER — FERROUS SULFATE 325 (65 FE) MG PO TABS
325.0000 mg | ORAL_TABLET | Freq: Every day | ORAL | Status: DC
  Administered 2019-04-28 – 2019-04-29 (×2): 325 mg via ORAL
  Filled 2019-04-27 (×2): qty 1

## 2019-04-27 MED ORDER — FAMOTIDINE 10 MG PO TABS
10.0000 mg | ORAL_TABLET | Freq: Two times a day (BID) | ORAL | Status: DC
  Administered 2019-04-27 – 2019-04-29 (×4): 10 mg via ORAL
  Filled 2019-04-27 (×5): qty 1

## 2019-04-27 MED ORDER — PHENYLEPHRINE HCL (PRESSORS) 10 MG/ML IV SOLN
INTRAVENOUS | Status: AC
  Filled 2019-04-27: qty 1

## 2019-04-27 MED ORDER — LIDOCAINE 2% (20 MG/ML) 5 ML SYRINGE
INTRAMUSCULAR | Status: AC
  Filled 2019-04-27: qty 5

## 2019-04-27 MED ORDER — PHENYLEPHRINE HCL-NACL 10-0.9 MG/250ML-% IV SOLN
INTRAVENOUS | Status: DC | PRN
  Administered 2019-04-27: 15 ug/min via INTRAVENOUS

## 2019-04-27 MED ORDER — CELECOXIB 200 MG PO CAPS
200.0000 mg | ORAL_CAPSULE | Freq: Once | ORAL | Status: AC
  Administered 2019-04-27: 200 mg via ORAL
  Filled 2019-04-27: qty 1

## 2019-04-27 MED ORDER — BUPIVACAINE LIPOSOME 1.3 % IJ SUSP
20.0000 mL | Freq: Once | INTRAMUSCULAR | Status: AC
  Administered 2019-04-27: 40 mL
  Filled 2019-04-27: qty 20

## 2019-04-27 MED ORDER — ACETAMINOPHEN 500 MG PO TABS
1000.0000 mg | ORAL_TABLET | Freq: Four times a day (QID) | ORAL | Status: AC
  Administered 2019-04-28 (×3): 1000 mg via ORAL
  Filled 2019-04-27 (×3): qty 2

## 2019-04-27 MED ORDER — ALPRAZOLAM 1 MG PO TABS
1.0000 mg | ORAL_TABLET | Freq: Every day | ORAL | Status: DC
  Administered 2019-04-27 – 2019-04-28 (×2): 1 mg via ORAL
  Filled 2019-04-27 (×2): qty 1

## 2019-04-27 MED ORDER — FENTANYL CITRATE (PF) 250 MCG/5ML IJ SOLN
INTRAMUSCULAR | Status: DC | PRN
  Administered 2019-04-27: 50 ug via INTRAVENOUS

## 2019-04-27 MED ORDER — DIPHENHYDRAMINE HCL 50 MG/ML IJ SOLN: 12.5000 mg | Freq: Four times a day (QID) | INTRAMUSCULAR | Status: DC | PRN

## 2019-04-27 MED ORDER — ONDANSETRON HCL 4 MG/2ML IJ SOLN
INTRAMUSCULAR | Status: AC
  Filled 2019-04-27: qty 2

## 2019-04-27 MED ORDER — ONDANSETRON HCL 4 MG/2ML IJ SOLN
INTRAMUSCULAR | Status: DC | PRN
  Administered 2019-04-27: 4 mg via INTRAVENOUS

## 2019-04-27 MED ORDER — HYDROCODONE-ACETAMINOPHEN 5-325 MG PO TABS: 1.0000 | ORAL_TABLET | Freq: Four times a day (QID) | ORAL | 0 refills | Status: DC | PRN

## 2019-04-27 MED ORDER — LIDOCAINE 2% (20 MG/ML) 5 ML SYRINGE
INTRAMUSCULAR | Status: DC | PRN
  Administered 2019-04-27: 1.5 mg/kg/h via INTRAVENOUS

## 2019-04-27 MED ORDER — HYDROMORPHONE HCL 1 MG/ML IJ SOLN: 0.2500 mg | INTRAMUSCULAR | Status: DC | PRN

## 2019-04-27 MED ORDER — PROPOFOL 10 MG/ML IV BOLUS
INTRAVENOUS | Status: DC | PRN
  Administered 2019-04-27: 80 mg via INTRAVENOUS

## 2019-04-27 MED ORDER — SODIUM CHLORIDE (PF) 0.9 % IJ SOLN
INTRAMUSCULAR | Status: AC
  Filled 2019-04-27: qty 20

## 2019-04-27 MED ORDER — IMIPRAMINE HCL 50 MG PO TABS
50.0000 mg | ORAL_TABLET | Freq: Every day | ORAL | Status: DC
  Administered 2019-04-27 – 2019-04-28 (×2): 50 mg via ORAL
  Filled 2019-04-27 (×3): qty 1

## 2019-04-27 MED ORDER — ROCURONIUM BROMIDE 10 MG/ML (PF) SYRINGE
PREFILLED_SYRINGE | INTRAVENOUS | Status: AC
  Filled 2019-04-27: qty 10

## 2019-04-27 MED ORDER — AMIODARONE HCL 200 MG PO TABS
200.0000 mg | ORAL_TABLET | Freq: Every day | ORAL | Status: DC
  Administered 2019-04-28 – 2019-04-29 (×2): 200 mg via ORAL
  Filled 2019-04-27 (×2): qty 1

## 2019-04-27 MED ORDER — OXYCODONE HCL 5 MG PO TABS
5.0000 mg | ORAL_TABLET | ORAL | Status: DC | PRN
  Administered 2019-04-27 – 2019-04-29 (×3): 5 mg via ORAL
  Filled 2019-04-27 (×3): qty 1

## 2019-04-27 MED ORDER — MAGNESIUM CITRATE PO SOLN: 1.0000 | Freq: Once | ORAL | Status: DC

## 2019-04-27 MED ORDER — ANASTROZOLE 1 MG PO TABS
1.0000 mg | ORAL_TABLET | Freq: Every day | ORAL | Status: DC
  Administered 2019-04-27 – 2019-04-29 (×3): 1 mg via ORAL
  Filled 2019-04-27 (×6): qty 1

## 2019-04-27 MED ORDER — ALBUMIN HUMAN 5 % IV SOLN: INTRAVENOUS | Status: DC | PRN

## 2019-04-27 MED ORDER — LACTATED RINGERS IV SOLN: INTRAVENOUS | Status: DC | PRN

## 2019-04-27 MED ORDER — HYDROMORPHONE HCL 1 MG/ML IJ SOLN: 0.5000 mg | INTRAMUSCULAR | Status: DC | PRN

## 2019-04-27 MED ORDER — DEXAMETHASONE SODIUM PHOSPHATE 10 MG/ML IJ SOLN
INTRAMUSCULAR | Status: AC
  Filled 2019-04-27: qty 1

## 2019-04-27 MED ORDER — INSULIN ASPART 100 UNIT/ML ~~LOC~~ SOLN: 0.0000 [IU] | Freq: Three times a day (TID) | SUBCUTANEOUS | Status: DC

## 2019-04-27 MED ORDER — SODIUM CHLORIDE 0.45 % IV SOLN: INTRAVENOUS | Status: DC

## 2019-04-27 MED ORDER — PHENYLEPHRINE 40 MCG/ML (10ML) SYRINGE FOR IV PUSH (FOR BLOOD PRESSURE SUPPORT)
PREFILLED_SYRINGE | INTRAVENOUS | Status: DC | PRN
  Administered 2019-04-27 (×2): 80 ug via INTRAVENOUS

## 2019-04-27 MED ORDER — ONDANSETRON HCL 4 MG/2ML IJ SOLN: 4.0000 mg | INTRAMUSCULAR | Status: DC | PRN

## 2019-04-27 MED ORDER — ALBUMIN HUMAN 5 % IV SOLN
INTRAVENOUS | Status: AC
  Filled 2019-04-27: qty 250

## 2019-04-27 MED ORDER — EPHEDRINE SULFATE-NACL 50-0.9 MG/10ML-% IV SOSY
PREFILLED_SYRINGE | INTRAVENOUS | Status: DC | PRN
  Administered 2019-04-27 (×3): 10 mg via INTRAVENOUS

## 2019-04-27 MED ORDER — LIDOCAINE 2% (20 MG/ML) 5 ML SYRINGE
INTRAMUSCULAR | Status: DC | PRN
  Administered 2019-04-27: 60 mg via INTRAVENOUS

## 2019-04-27 MED ORDER — LEVOTHYROXINE SODIUM 112 MCG PO TABS
112.0000 ug | ORAL_TABLET | Freq: Every day | ORAL | Status: DC
  Administered 2019-04-28: 112 ug via ORAL
  Filled 2019-04-27: qty 1

## 2019-04-27 MED ORDER — METOPROLOL SUCCINATE ER 25 MG PO TB24
25.0000 mg | ORAL_TABLET | Freq: Every day | ORAL | Status: DC
  Administered 2019-04-28 – 2019-04-29 (×2): 25 mg via ORAL
  Filled 2019-04-27 (×2): qty 1

## 2019-04-27 MED ORDER — DEXAMETHASONE SODIUM PHOSPHATE 10 MG/ML IJ SOLN
INTRAMUSCULAR | Status: DC | PRN
  Administered 2019-04-27: 4 mg via INTRAVENOUS

## 2019-04-27 MED ORDER — LACTATED RINGERS IR SOLN
Status: DC | PRN
  Administered 2019-04-27: 1000 mL

## 2019-04-27 MED ORDER — ROCURONIUM BROMIDE 10 MG/ML (PF) SYRINGE
PREFILLED_SYRINGE | INTRAVENOUS | Status: DC | PRN
  Administered 2019-04-27: 60 mg via INTRAVENOUS
  Administered 2019-04-27 (×2): 20 mg via INTRAVENOUS

## 2019-04-27 SURGICAL SUPPLY — 67 items
BAG LAPAROSCOPIC 12 15 PORT 16 (BASKET) IMPLANT
BAG RETRIEVAL 12/15 (BASKET)
BAG RETRIEVAL 12/15MM (BASKET)
CHLORAPREP W/TINT 26 (MISCELLANEOUS) ×3 IMPLANT
CLIP SUT LAPRA TY ABSORB (SUTURE) ×6 IMPLANT
CLIP VESOLOCK LG 6/CT PURPLE (CLIP) ×3 IMPLANT
CLIP VESOLOCK MED LG 6/CT (CLIP) ×6 IMPLANT
CLIP VESOLOCK XL 6/CT (CLIP) ×3 IMPLANT
COVER SURGICAL LIGHT HANDLE (MISCELLANEOUS) ×3 IMPLANT
COVER TIP SHEARS 8 DVNC (MISCELLANEOUS) ×1 IMPLANT
COVER TIP SHEARS 8MM DA VINCI (MISCELLANEOUS) ×2
COVER WAND RF STERILE (DRAPES) IMPLANT
CUTTER ECHEON FLEX ENDO 45 340 (ENDOMECHANICALS) ×3 IMPLANT
DECANTER SPIKE VIAL GLASS SM (MISCELLANEOUS) ×3 IMPLANT
DERMABOND ADVANCED (GAUZE/BANDAGES/DRESSINGS) ×2
DERMABOND ADVANCED .7 DNX12 (GAUZE/BANDAGES/DRESSINGS) ×1 IMPLANT
DRAIN CHANNEL 15F RND FF 3/16 (WOUND CARE) ×3 IMPLANT
DRAPE ARM DVNC X/XI (DISPOSABLE) ×4 IMPLANT
DRAPE COLUMN DVNC XI (DISPOSABLE) ×1 IMPLANT
DRAPE DA VINCI XI ARM (DISPOSABLE) ×8
DRAPE DA VINCI XI COLUMN (DISPOSABLE) ×2
DRAPE INCISE IOBAN 66X45 STRL (DRAPES) ×3 IMPLANT
DRAPE SHEET LG 3/4 BI-LAMINATE (DRAPES) ×3 IMPLANT
ELECT PENCIL ROCKER SW 15FT (MISCELLANEOUS) ×3 IMPLANT
ELECT REM PT RETURN 15FT ADLT (MISCELLANEOUS) ×3 IMPLANT
EVACUATOR SILICONE 100CC (DRAIN) ×3 IMPLANT
GLOVE BIO SURGEON STRL SZ 6.5 (GLOVE) ×2 IMPLANT
GLOVE BIO SURGEONS STRL SZ 6.5 (GLOVE) ×1
GLOVE BIOGEL M STRL SZ7.5 (GLOVE) ×6 IMPLANT
GOWN STRL REUS W/TWL LRG LVL3 (GOWN DISPOSABLE) ×9 IMPLANT
HEMOSTAT SURGICEL 4X8 (HEMOSTASIS) ×3 IMPLANT
IRRIG SUCT STRYKERFLOW 2 WTIP (MISCELLANEOUS) ×3
IRRIGATION SUCT STRKRFLW 2 WTP (MISCELLANEOUS) ×1 IMPLANT
KIT BASIN (CUSTOM PROCEDURE TRAY) ×3 IMPLANT
KIT TURNOVER KIT A (KITS) IMPLANT
LOOP VESSEL MAXI BLUE (MISCELLANEOUS) ×3 IMPLANT
MARKER SKIN DUAL TIP RULER LAB (MISCELLANEOUS) ×3 IMPLANT
NEEDLE INSUFFLATION 14GA 120MM (NEEDLE) ×3 IMPLANT
NS IRRIG 1000ML POUR BTL (IV SOLUTION) ×3 IMPLANT
PENCIL SMOKE EVACUATOR (MISCELLANEOUS) IMPLANT
PORT ACCESS TROCAR AIRSEAL 12 (TROCAR) ×1 IMPLANT
PORT ACCESS TROCAR AIRSEAL 5M (TROCAR) ×2
PROTECTOR NERVE ULNAR (MISCELLANEOUS) ×6 IMPLANT
SEAL CANN UNIV 5-8 DVNC XI (MISCELLANEOUS) ×4 IMPLANT
SEAL XI 5MM-8MM UNIVERSAL (MISCELLANEOUS) ×8
SET TRI-LUMEN FLTR TB AIRSEAL (TUBING) ×3 IMPLANT
SOLUTION ELECTROLUBE (MISCELLANEOUS) ×3 IMPLANT
STAPLE RELOAD 45 WHT (STAPLE) ×9 IMPLANT
STAPLE RELOAD 45MM WHITE (STAPLE) ×18
SUT ETHILON 3 0 PS 1 (SUTURE) ×3 IMPLANT
SUT MNCRL AB 4-0 PS2 18 (SUTURE) ×6 IMPLANT
SUT PDS AB 1 CT1 27 (SUTURE) ×9 IMPLANT
SUT VIC AB 0 CT1 27 (SUTURE) ×8
SUT VIC AB 0 CT1 27XBRD ANTBC (SUTURE) ×4 IMPLANT
SUT VIC AB 2-0 SH 27 (SUTURE) ×4
SUT VIC AB 2-0 SH 27X BRD (SUTURE) ×2 IMPLANT
SUT VLOC BARB 180 ABS3/0GR12 (SUTURE) ×3
SUTURE VLOC BRB 180 ABS3/0GR12 (SUTURE) ×1 IMPLANT
TOWEL OR 17X26 10 PK STRL BLUE (TOWEL DISPOSABLE) ×3 IMPLANT
TOWEL OR NON WOVEN STRL DISP B (DISPOSABLE) ×3 IMPLANT
TRAY FOLEY MTR SLVR 14FR STAT (SET/KITS/TRAYS/PACK) ×3 IMPLANT
TRAY FOLEY MTR SLVR 16FR STAT (SET/KITS/TRAYS/PACK) IMPLANT
TRAY LAPAROSCOPIC (CUSTOM PROCEDURE TRAY) ×3 IMPLANT
TROCAR BLADELESS OPT 5 100 (ENDOMECHANICALS) IMPLANT
TROCAR UNIVERSAL OPT 12M 100M (ENDOMECHANICALS) ×3 IMPLANT
TROCAR XCEL 12X100 BLDLESS (ENDOMECHANICALS) ×3 IMPLANT
WATER STERILE IRR 1000ML POUR (IV SOLUTION) ×3 IMPLANT

## 2019-04-27 NOTE — Brief Op Note (Signed)
04/27/2019  4:19 PM  PATIENT:  Cynthia [name]  74 y.o. 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  PROCEDURE:  Procedure(s) with comments: XI ROBOT ASSITED LAPAROSCOPIC NEPHROURETERECTOMY (Left) - 3.5 HRS  SURGEON:  Surgeon(s) and Role:    Alexis Frock, MD - Primary  PHYSICIAN ASSISTANT:   ASSISTANTS: Debbrah Alar PA   ANESTHESIA:   local and general  EBL:  100 mL   BLOOD ADMINISTERED:none  DRAINS: 1 -JP to bulb; 2 - Foley to gravity   LOCAL MEDICATIONS USED:  MARCAINE     SPECIMEN:  Source of Specimen:  1 - left kidney + ureter + baldder cuff en bloc  DISPOSITION OF SPECIMEN:  PATHOLOGY  COUNTS:  YES  TOURNIQUET:  * No tourniquets in log *  DICTATION: .Other Dictation: Dictation Number  [id number]  PLAN OF CARE: Admit to inpatient   PATIENT DISPOSITION:  PACU - hemodynamically stable.   Delay start of Pharmacological VTE agent (>24hrs) due to surgical blood loss or risk of bleeding: yes

## 2019-04-27 NOTE — Anesthesia Preprocedure Evaluation (Addendum)
Anesthesia Evaluation  Patient identified by MRN, date of birth, ID band Patient awake    Reviewed: Allergy & Precautions, H&P , NPO status , Patient's Chart, lab work & pertinent test results, reviewed documented beta blocker date and time   Airway Mallampati: III  TM Distance: >3 FB Neck ROM: Full    Dental no notable dental hx. (+) Teeth Intact, Dental Advisory Given   Pulmonary neg pulmonary ROS,    Pulmonary exam normal breath sounds clear to auscultation       Cardiovascular hypertension, Pt. on medications and Pt. on home beta blockers + dysrhythmias Atrial Fibrillation  Rhythm:Irregular Rate:Normal     Neuro/Psych Anxiety Depression negative neurological ROS     GI/Hepatic Neg liver ROS, GERD  ,  Endo/Other  diabetes, Type 2, Oral Hypoglycemic AgentsHypothyroidism   Renal/GU negative Renal ROS  negative genitourinary   Musculoskeletal  (+) Arthritis , Osteoarthritis,    Abdominal   Peds  Hematology negative hematology ROS (+)   Anesthesia Other Findings   Reproductive/Obstetrics negative OB ROS                            Anesthesia Physical Anesthesia Plan  ASA: III  Anesthesia Plan: General   Post-op Pain Management:    Induction: Intravenous  PONV Risk Score and Plan: 4 or greater and Ondansetron, Dexamethasone and Midazolam  Airway Management Planned: Oral ETT  Additional Equipment:   Intra-op Plan:   Post-operative Plan: Extubation in OR  Informed Consent: I have reviewed the patients History and Physical, chart, labs and discussed the procedure including the risks, benefits and alternatives for the proposed anesthesia with the patient or authorized representative who has indicated his/her understanding and acceptance.     Dental advisory given  Plan Discussed with: CRNA  Anesthesia Plan Comments:         Anesthesia Quick Evaluation

## 2019-04-27 NOTE — Anesthesia Procedure Notes (Addendum)
Procedure Name: Intubation Date/Time: 04/27/2019 1:06 PM Performed by: Mitzie Na, CRNA Pre-anesthesia Checklist: Patient identified, Emergency Drugs available, Suction available and Patient being monitored Patient Re-evaluated:Patient Re-evaluated prior to induction Oxygen Delivery Method: Circle system utilized Preoxygenation: Pre-oxygenation with 100% oxygen Induction Type: IV induction Ventilation: Mask ventilation without difficulty Laryngoscope Size: Mac and 3 Grade View: Grade II Tube type: Oral Tube size: 7.0 mm Number of attempts: 1 Airway Equipment and Method: Stylet Placement Confirmation: ETT inserted through vocal cords under direct vision,  positive ETCO2 and breath sounds checked- equal and bilateral Secured at: 23 cm Tube secured with: Tape Dental Injury: Teeth and Oropharynx as per pre-operative assessment

## 2019-04-27 NOTE — H&P (Signed)
Cynthia [name] is an 74 y.o. female.    Chief Complaint: Pre-OP LEFT Nephroureterectomy  HPI:   1 - High Grade Left Renal Pelvis Cancer - large volume left renal pelvis cancer by cT and ureteroscopic biopsy 2021 on eval hematuria.   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 Dr. Rogene [name] cards). 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 Her PCP is Cynthia Small MD    Today " Cynthia [name] " is seen to proceed with LEFT Nephroureterectomy for high grade left renal cancer in horshoe kidney. C19 screen negative. Hgb 13. Cr 0.9. She has held eliquus by cards recs.    Past Medical History:  Diagnosis Date  . Anticoagulant long-term use    eliquis--- managed by cardiology  . Anxiety   . Arthritis   . Bradycardia   . DDD (degenerative disc disease), lumbar   . Depression   . Dyspnea    ON EXERTION  . First degree heart block   . Genetic testing 06/25/2016   Ms. Weppler underwent genetic counseling and testing for hereditary cancer syndromes on 06/17/2016. Her results were negative for mutations in all 46 genes analyzed by [facility]'s 46-gene Common Hereditary Cancers Panel. Genes analyzed include: APC, ATM, AXIN2, BARD1, BMPR1A, BRCA1, BRCA2, BRIP1, CDH1, CDKN2A, CHEK2, CTNNA1, DICER1, EPCAM, GREM1, HOXB13, KIT, MEN1, MLH1, MSH2, MSH3, MSH6, MUTYH, NBN,  . GERD (gastroesophageal reflux disease)    occasional,  takes pepcid  . Hematuria   . History of radiation therapy 05/22/2016 to 06/20/2016   right breast cancer  . Hypertension    followd by pcp---  (02-22-2019 per pt never had stress test)  . Hypothyroidism    followed by pcp  . IBS (irritable bowel  syndrome)   . Incomplete right bundle branch block   . Insomnia   . Malignant neoplasm of upper-inner quadrant of right breast in female, estrogen receptor positive [facility]) oncologist--- dr Cynthia [name]   dx 03/ 2018,  Stage IA, IDC, ER/PR +;  04-01-2016 s/p  right breast lumpectomy w/ node dissection's;  completed radiation 06-20-2016  . MVP (mitral valve prolapse)    per echo 12-11-2018 in epic, mild   . NSVT (nonsustained ventricular tachycardia) ([place]) followed by cardiology   12-10-2018  hospital admission ,  refer to discharge note 12-14-2018 for treatement  . PAF (paroxysmal atrial fibrillation) [facility]) primary cardiologist--- dr Cynthia [name]   newly dx 12-10-2018  admission in epic, Afib w/ RVR and NSVT;   in epic TTE 12-11-2018 showed ef 50-55%, mild MVP,  mild AV sclerosis without stenosis;   Cardiac MRI 12-13-2018 in epic  . Renal mass, left    pelvis   . Restless leg syndrome   . Type 2 diabetes mellitus ([place])    followed by pcp---  (02-22-2019 does not check blood sugar's)    Past Surgical History:  Procedure Laterality Date  . BREAST LUMPECTOMY WITH RADIOACTIVE SEED AND SENTINEL LYMPH NODE BIOPSY Right 04/01/2016   Procedure: RADIOACTIVE SEED GUIDED RIGHT BREAST LUMPECTOMY WITH RIGHT AXILLARY SENTINEL LYMPH NODE BIOPSY.;  Surgeon: Fanny Skates, MD;  Location: [address];  Service: General;  Laterality: Right;  .  CATARACT EXTRACTION W/ INTRAOCULAR LENS  IMPLANT, BILATERAL  1980s  . CYSTOSCOPY/RETROGRADE/URETEROSCOPY N/A 03/02/2019   Procedure: CYSTOSCOPY/LEFT RETROGRADE/URETEROSCOPY/  BIOPSY/  STENT;  Surgeon: Ceasar Mons, MD;  Location: [facility];  Service: Urology;  Laterality: N/A;  . LAPAROSCOPIC CHOLECYSTECTOMY  1990s  . TONSILLECTOMY  age 34    Family History  Problem Relation Age of Onset  . Breast cancer Sister 61       d.54  . Leukemia Brother 66  . Cervical cancer Sister 79  . Ovarian cancer Maternal Aunt 92       d.92s  . Breast cancer Other 45    Social History:  reports that she has never smoked. She has never used smokeless tobacco. She reports current alcohol use. She reports that she does not use drugs.  Allergies: No Known Allergies  No medications prior to admission.    No results found for this or any previous visit (from the past 48 hour(s)). No results found.  Review of Systems  Constitutional: Negative.   HENT: Negative.   Eyes: Negative.   Respiratory: Negative.   Cardiovascular: Negative.   Endocrine: Negative.   Genitourinary: Positive for hematuria.  Allergic/Immunologic: Negative.   Neurological: Negative.   Hematological: Negative.     There were no vitals taken for this visit. Physical Exam  Constitutional: She appears well-developed.  HENT:  Head: Normocephalic.  Eyes: Pupils are equal, round, and reactive to light.  Cardiovascular: Normal rate.  Respiratory: Effort normal.  GI: Soft.  Genitourinary:    Genitourinary Comments: No CVAT at present.    Musculoskeletal:        General: Normal range of motion.  Neurological: She is alert.  Skin: Skin is warm.     Assessment/Plan  Proceed as planned with LEFT nephroureterectomy. She understands that her baseline comorbidity and varient anatomy make this more risky than typical espeically with higer risk blood loss and subsequent CV risk. Risks, benefits, alternatives, expected peri-op course discussed previously and reiterated today.   Cynthia Frock, MD 04/27/2019, 5:44 AM

## 2019-04-27 NOTE — Anesthesia Postprocedure Evaluation (Signed)
Anesthesia Post Note  Patient: Cynthia [name]  Procedure(s) Performed: XI ROBOT ASSITED LAPAROSCOPIC NEPHROURETERECTOMY (Left )     Patient location during evaluation: PACU Anesthesia Type: General Level of consciousness: awake and alert Pain management: pain level controlled Vital Signs Assessment: post-procedure vital signs reviewed and stable Respiratory status: spontaneous breathing, nonlabored ventilation and respiratory function stable Cardiovascular status: blood pressure returned to baseline and stable Postop Assessment: no apparent nausea or vomiting Anesthetic complications: no    Last Vitals:  Vitals:   04/27/19 1715 04/27/19 1730  BP: 125/68 114/70  Pulse: (!) 58 (!) 59  Resp: 20 15  Temp:    SpO2: 100% 100%    Last Pain:  Vitals:   04/27/19 1730  TempSrc:   PainSc: 0-No pain                 Kymoni Monday,W. EDMOND

## 2019-04-27 NOTE — Transfer of Care (Signed)
Immediate Anesthesia Transfer of Care Note  Patient: Heloise Akel  Procedure(s) Performed: XI ROBOT ASSITED LAPAROSCOPIC NEPHROURETERECTOMY (Left )  Patient Location: PACU  Anesthesia Type:General  Level of Consciousness: awake, alert , oriented and patient cooperative  Airway & Oxygen Therapy: Patient Spontanous Breathing and Patient connected to face mask oxygen  Post-op Assessment: Report given to RN, Post -op Vital signs reviewed and stable and Patient moving all extremities  Post vital signs: Reviewed and stable  Last Vitals:  Vitals Value Taken Time  BP 132/64 04/27/19 1634  Temp    Pulse    Resp 16 04/27/19 1637  SpO2    Vitals shown include unvalidated device data.  Last Pain:  Vitals:   04/27/19 1122  TempSrc:   PainSc: 0-No pain         Complications: No apparent anesthesia complications

## 2019-04-28 LAB — BASIC METABOLIC PANEL
Anion gap: 9 (ref 5–15)
BUN: 15 mg/dL (ref 8–23)
CO2: 25 mmol/L (ref 22–32)
Calcium: 8.2 mg/dL — ABNORMAL LOW (ref 8.9–10.3)
Chloride: 101 mmol/L (ref 98–111)
Creatinine, Ser: 1.04 mg/dL — ABNORMAL HIGH (ref 0.44–1.00)
GFR calc Af Amer: >60 mL/min (ref >60)
GFR calc non Af Amer: 52 mL/min — ABNORMAL LOW (ref >60)
Glucose, Bld: 114 mg/dL — ABNORMAL HIGH (ref 70–99)
Potassium: 4.1 mmol/L (ref 3.5–5.1)
Sodium: 135 mmol/L (ref 135–145)

## 2019-04-28 LAB — GLUCOSE, CAPILLARY
Glucose-Capillary: 84 mg/dL (ref 70–99)
Glucose-Capillary: 100 mg/dL — ABNORMAL HIGH (ref 70–99)
Glucose-Capillary: 67 mg/dL — ABNORMAL LOW (ref 70–99)
Glucose-Capillary: 98 mg/dL (ref 70–99)
Glucose-Capillary: 131 mg/dL — ABNORMAL HIGH (ref 70–99)

## 2019-04-28 LAB — HEMOGLOBIN AND HEMATOCRIT, BLOOD
HCT: 34.2 % — ABNORMAL LOW (ref 36.0–46.0)
Hemoglobin: 10.8 g/dL — ABNORMAL LOW (ref 12.0–15.0)

## 2019-04-28 MED ORDER — CHLORHEXIDINE GLUCONATE CLOTH 2 % EX PADS
6.0000 | MEDICATED_PAD | Freq: Every day | CUTANEOUS | Status: DC
  Administered 2019-04-28 – 2019-04-29 (×2): 6 via TOPICAL

## 2019-04-28 NOTE — Op Note (Signed)
NAMELATIFA, VOORHIS MEDICAL RECORD [id number] ACCOUNT [id number] DATE OF BIRTH:10-25-1945 FACILITY: [facility] LOCATION: [facility] PHYSICIAN:Guila Owensby Tresa Moore, MD  OPERATIVE REPORT  DATE OF PROCEDURE:  04/27/2019  SURGEON:  Alexis Frock, MD  PREOPERATIVE DIAGNOSIS:  Large volume left renal pelvis cancer and horseshoe kidney.  PROCEDURE:  Left robotic-assisted laparoscopic nephroureterectomy.  ESTIMATED BLOOD LOSS:  100 mL.  COMPLICATIONS:  None.  SPECIMEN:  Left kidney, plus ureter, plus bladder cuff with partial isthmus en block.  ASSISTANT:  Debbrah Alar, PA  FINDINGS: 1.  Very aberrant left renal vascular anatomy as anticipated with a dominant single artery 2 vein at the level of the hilum, accessory artery and vein of the left isthmus lower pole area. 2.  Horseshoe kidney.  DRAINS:   1.  Jackson-Pratt drain to bulb suction. 2.  Foley to straight drain.  INDICATIONS:  The patient is a pleasant 74 year old woman who was found on workup of hematuria to have large volume left renal pelvis cancer.  This was high grade, not amenable to endoscopic resection.  Also has notably a horseshoe kidney with  significant isthmus.  Options were discussed for further management, including recommended path of left nephroureterectomy and she wished to proceed.  Informed consent was obtained and placed in the medical record.  DESCRIPTION OF PROCEDURE:  The patient being identified, the procedure being left nephroureterectomy was confirmed.  Procedure  timeout was performed.  Intravenous antibiotics administered.  General endotracheal anesthesia induced.  The patient was  placed into full flank position, left side up and pulling 15 degrees of table flexion, superior arm elevator, axillary roll, sequential compression devices with bottom leg bent, top leg straight.  She was further fastened to the operative table using  3-inch tape over foam padding across the supraxiphoid chest and her pelvis.  A  sterile field was created using chlorhexidine gluconate.  Next, high-flow, low-pressure pneumoperitoneum was obtained using Veress technique in the left lower quadrant, having  passed the aspiration and drop test after Foley catheter was placed free to straight drain.  An 8 mm robotic camera port was then placed in position approximately 4 fingerbreadths superolateral to the umbilicus.  Laparoscopic examination of the  peritoneal cavity revealed  no significant adhesions, no visceral injury.  Additional ports were placed as follows:  Left subcostal 8 mm robotic port, left far lateral 8 mm robotic port approximately 3 fingerbreadths superomedial to the anterior iliac  spine, left paramedian inferior robotic port approximately 4 fingerbreadths superior to the pubic ramus and two 12 mm assistant port sites in the midline, 1 in the supraumbilical crease and another 2 fingerbreadths above the area of the camera port.  The  robot was then docked and passed electronic checks.  Initial attention was directed at development of the left retroperitoneum.  Incision was made lateral to the descending colon from the area of the splenic flexure towards the area of the internal ring  and the colon was carefully swept medially.  Lateral splenic attachments were released, allowing the spleen to rotate medially and the plane between the posterior aspect of the pancreas and Gerota fascia further development.  The mid pole of the left  renal area was identified, placed on gentle lateral traction.  Dissection proceeded medial to this.  The left ureter was encountered, marked with a vessel loop and dissection proceeded medial to this aspect.  The aorta was encountered, as was the psoas  musculature and dissection proceeded within this triangle superiorly towards the renal hilum.  The renal hilum  was quite complex as expected, encompassing a 2 vein single artery left renal vascular anatomy. The superior vein was controlled using  vascular  stapler, which opened a window to better visualize the artery which was controlled using an extra-large Hem-o-Lok clip proximal, vascular stapler distal and finally the more inferior vein using vascular stapler, resulting in excellent hemostatic control  of the dominant aspect of the hilum.  Superior attachments were taken down using cautery scissors, performing a partial adrenal sparing fashion and the lateral attachments were also taken down to allow rotation of the kidney so that the posterior aspect  was carefully inspected and no additional vessels were noted.  Attention was directed to identification of the isthmus and known isthmus vessels.  The isthmus area was identified  of the lower pole of the kidney and circumferentially mobilized and  left-sided isthmus vessels were noted as anticipated with a single small artery and a larger vein was controlled using vascular stapler.  Attention was then directed to division of the isthmus.  A partial nephrectomy technique was prepared for with a  bolster, including parenchymal apposition sutures and parenchymal apposition sutures which were made on the side table and very careful division was performed of the isthmus using cautery scissors.  This actually resulted in amazingly good hemostasis with  taking of the vessels and cautery scissors alone.  An additional point coagulation current was applied.  It was felt that additional suturing would be simply point unless hemostasis was essentially complete.  A bolster was placed in the area of the  isthmus resection, which was   just to the right of the border of the aorta.  The ureter was then circumferentially mobilized distally to the area of the true pelvis, past the area of the iliac crossing towards the area of the ureterovesical  junction.  A medial stay suture of 3-0 V-Loc was applied and final dissection was performed after placing a Hem-o-lok clip across the ureter to prevent antegrade tumor  spillage, encompassing a small area of the bladder cuff in the left ureter, plus  kidney specimen was placed in an EndoCatch bag for later retrieval.  The 3-0 stay sutures were then used to close the small cystotomy which was visibly reapproximated.  Hemostasis appeared excellent.  All sponge and needle counts were correct.  We  achieved the goals of surgery today.  A closed suction drain was brought through the previous left lateral most robotic port site near the peritoneal cavity and sutured in place with nylon.  The specimen was retrieved by extending the previous assistant  port sites in the midline, connecting them, removing the left kidney, plus ureter, plus bladder cuff specimen en bloc, setting aside for permanent pathology.  Extraction site was closed at the fascia using figure-of-eight PDS x6, followed by  reapproximation of Scarpa's with a running Vicryl.  All incision sites were infiltrated with dilute lipolyzed Marcaine and closed at the level of the skin using subcuticular Monocryl and Dermabond.  Procedure was then terminated.  The patient tolerated  the procedure well.  No immediate perioperative complications.  The patient was taken to the postanesthesia care unit in stable condition. Will plan for inpatient admission.  Please note, first assistant Debbrah Alar was crucial for all portions of the surgery today.  She provided invaluable vascular clipping, vascular stapling, specimen manipulation, retraction and general first assistance.  VN/NUANCE  D:04/27/2019 T:04/27/2019 JOB:[id number]

## 2019-04-28 NOTE — Plan of Care (Signed)
  Problem: Clinical Measurements: Goal: Postoperative complications will be avoided or minimized Outcome: Progressing   Problem: Education: Goal: Knowledge of General Education information will improve Description: Including pain rating scale, medication(s)/side effects and non-pharmacologic comfort measures Outcome: Progressing   Problem: Clinical Measurements: Goal: Will remain free from infection Outcome: Progressing   Problem: Nutrition: Goal: Adequate nutrition will be maintained Outcome: Progressing   Problem: Pain Managment: Goal: General experience of comfort will improve Outcome: Progressing   Problem: Safety: Goal: Ability to remain free from injury will improve Outcome: Progressing   Problem: Skin Integrity: Goal: Risk for impaired skin integrity will decrease Outcome: Progressing

## 2019-04-28 NOTE — Progress Notes (Signed)
1 Day Post-Op   Subjective/Chief Complaint:  1 - Left Renal Pelvis Cancer in Horsehoe Kidney - underwent LEFT robotic nephroureteretomy / isthmus division on 04/27/19, the day of admission.  Today "Cynthia [name]" is stable. Hgb and Cr acceptable. NO events overnight.    Objective: Vital signs in last 24 hours: Temp:  [97.5 F (36.4 C)-98.8 F (37.1 C)] 98.2 F (36.8 C) (04/22 0522) Pulse Rate:  [57-68] 61 (04/22 0522) Resp:  [14-20] 18 (04/22 0522) BP: (106-140)/(60-70) 106/60 (04/22 0522) SpO2:  [94 %-100 %] 96 % (04/22 0522) Weight:  [73.3 kg] 73.3 kg (04/21 1041) Last BM Date: 04/26/19  Intake/Output from previous day: 04/21 0701 - 04/22 0700 In: 3260 [I.V.:2910; IV Piggyback:350] Out: 1180 [Urine:850; Drains:230; Blood:100] Intake/Output this shift: No intake/output data recorded.  General appearance: alert, cooperative and appears stated age Eyes: negative Neck: supple, symmetrical, trachea midline Back: symmetric, no curvature. ROM normal. No CVA tenderness. Resp: non-labored on room air.  Cardio: Nl rate GI: soft, non-tender; bowel sounds normal; no masses,  no organomegaly and recent port sites c/d/i. JP seronguinous and non-foul. Foley with medium yellow urine.  Extremities: extremities normal, atraumatic, no cyanosis or edema Pulses: 2+ and symmetric Lymph nodes: Cervical, supraclavicular, and axillary nodes normal. Neurologic: Grossly normal  Lab Results:  Recent Labs    04/27/19 1704 04/28/19 0510  HGB 10.7* 10.8*  HCT 34.3* 34.2*   BMET Recent Labs    04/28/19 0510  NA 135  K 4.1  CL 101  CO2 25  GLUCOSE 114*  BUN 15  CREATININE 1.04*  CALCIUM 8.2*   PT/INR No results for input(s): LABPROT, INR in the last 72 hours. ABG No results for input(s): PHART, HCO3 in the last 72 hours.  Invalid input(s): PCO2, PO2  Studies/Results: No results found.  Anti-infectives: Anti-infectives (From admission, onward)   Start     Dose/Rate Route Frequency  Ordered Stop   04/27/19 1040  ceFAZolin (ANCEF) IVPB 2g/100 mL premix     2 g 200 mL/hr over 30 Minutes Intravenous 30 min pre-op 04/27/19 1040 04/27/19 1304      Assessment/Plan:  Ambulate, adv diet, slow IVF. Goals for DC discussed, likely tomorrow based on current progress.   Cynthia [name] 04/28/2019

## 2019-04-29 LAB — GLUCOSE, CAPILLARY
Glucose-Capillary: 84 mg/dL (ref 70–99)
Glucose-Capillary: 111 mg/dL — ABNORMAL HIGH (ref 70–99)
Glucose-Capillary: 128 mg/dL — ABNORMAL HIGH (ref 70–99)

## 2019-04-29 LAB — CREATININE, FLUID (PLEURAL, PERITONEAL, JP DRAINAGE): Creat, Fluid: 0.9 mg/dL

## 2019-04-29 MED ORDER — CEPHALEXIN 500 MG PO CAPS: 500.0000 mg | ORAL_CAPSULE | Freq: Two times a day (BID) | ORAL | 0 refills | Status: AC

## 2019-04-29 NOTE — Care Management Important Message (Signed)
Important Message  Patient Details IM Letter given to Dessa Phi RN Case Manager to present to the Patient Name: Cynthia [name] MRN: [id number] Date of Birth: Jul 10, 1945   Medicare Important Message Given:  Yes     Kerin Salen 04/29/2019, 11:55 AM

## 2019-04-29 NOTE — Progress Notes (Signed)
Went over discharge papers with patient and daughter.  All questions answered.  VSS. Foley and leg bag teaching completed with teach back.  Pt wheeled out via Therapist, sports.  AVS given to patient.

## 2019-04-29 NOTE — Discharge Instructions (Signed)
1- Drain Sites - You may have some mild persistent drainage from old drain site for several days, this is normal. This can be covered with cotton gauze for convenience.  2 - Stiches - Your stitches are all dissolvable. You may notice a "loose thread" at your incisions, these are normal and require no intervention. You may cut them flush to the skin with fingernail clippers if needed for comfort.  3 - Diet - No restrictions  4 - Activity - No heavy lifting / straining (any activities that require valsalva or "bearing down") x 4 weeks. Otherwise, no restrictions.  5 - Bathing - You may shower immediately. Do not take a bath or get into swimming pool where incision sites are submersed in water x 4 weeks.   6 - Catheter - Will remain in place until removed at your next appointment. It may be cleaned with soap and water in the shower. It may be disconnected from the drain bag while in the shower to avoid tripping over the tube. You may apply Neosporin or Vaseline ointment as needed to the tip of the penis where the catheter inserts to reduce friction and irritation in this spot.   7 - When to Call the Doctor - Call MD for any fever >102, any acute wound problems, or any severe nausea / vomiting. You can call the [facility] [phone]) 24 hours a day 365 days a year. It will roll-over to the answering service and on-call physician after hours.   You may resume Eliquis  after catheter removed.

## 2019-04-29 NOTE — Discharge Summary (Signed)
Physician Discharge Summary  Patient ID: Cynthia [name] MRN: [id number] DOB/AGE: 07-May-1945 74 y.o.  Admit date: 04/27/2019 Discharge date: 04/29/2019  Admission Diagnoses: Left renal pelvis cancer  Discharge Diagnoses:  Active Problems:   Ureteral neoplasm   Discharged Condition: good  Hospital Course: Pt underwent LEFT robotic nephroureterectomy of horseshoe kidney on 04/27/19, the day of admission, without acute complication. She was admitted to the 4th floor Urology service post-op. By the afternoon of POD 2 she is ambulatory, pain controlled on PO meds, tollerating PO nutrition, and felt to be adequate for discharge with foley. JP removed as Cr same as serum (0.9). Cr 1.04, Hgb 10.8, pathology pending at discharge.   Consults: None  Significant Diagnostic Studies: labs: as per above.  Treatments: surgery: as per above.   Discharge Exam: Blood pressure 111/72, pulse 72, temperature 98 F (36.7 C), temperature source Oral, resp. rate 17, height 5\' 5"  (1.651 m), weight 73.3 kg, SpO2 95 %. General appearance: alert, cooperative and very pleasant and spry.  Eyes: negative Nose: Nares normal. Septum midline. Mucosa normal. No drainage or sinus tenderness. Throat: lips, mucosa, and tongue normal; teeth and gums normal Neck: supple, symmetrical, trachea midline Back: symmetric, no curvature. ROM normal. No CVA tenderness. Resp: non-labored on room air.  Cardio: Nl rate GI: soft, non-tender; bowel sounds normal; no masses,  no organomegaly Pelvic: external genitalia normal and foley in palce wtih yellow non-foul urine.  Extremities: extremities normal, atraumatic, no cyanosis or edema Pulses: 2+ and symmetric Lymph nodes: Cervical, supraclavicular, and axillary nodes normal. Neurologic: Grossly normal Recent port and extraction sites c/d/i. JP removed and dry dressing placed, outpur non-foul.   Disposition:    Allergies as of 04/29/2019   No Known Allergies     Medication List     STOP taking these medications   apixaban 5 MG Tabs tablet Commonly known as: ELIQUIS   Vitamin D 50 MCG (2000 UT) Caps     TAKE these medications   ALPRAZolam 1 MG tablet Commonly known as: XANAX Take 1 mg by mouth at bedtime.   amiodarone 200 MG tablet Commonly known as: PACERONE Take 1 tablet (200 mg total) by mouth daily.   anastrozole 1 MG tablet Commonly known as: ARIMIDEX Take 1 tablet (1 mg total) by mouth daily.   cephALEXin 500 MG capsule Commonly known as: KEFLEX Take 1 capsule (500 mg total) by mouth 2 (two) times daily for 3 days. Begin day before appointment for catheter removal.   famotidine 10 MG tablet Commonly known as: PEPCID Take 10 mg by mouth 2 (two) times daily.   Fenofibric Acid 135 MG Cpdr Take 135 mg by mouth daily.   ferrous sulfate 325 (65 FE) MG tablet Take 325 mg by mouth daily with breakfast.   HYDROcodone-acetaminophen 5-325 MG tablet Commonly known as: Norco Take 1-2 tablets by mouth every 6 (six) hours as needed for moderate pain.   imipramine 50 MG tablet Commonly known as: TOFRANIL Take 50 mg by mouth at bedtime.   levothyroxine 112 MCG tablet Commonly known as: SYNTHROID Take 112 mcg by mouth at bedtime.   metFORMIN 500 MG tablet Commonly known as: GLUCOPHAGE Take 500 mg by mouth 2 (two) times daily.   metoprolol succinate 25 MG 24 hr tablet Commonly known as: TOPROL-XL Take one tablet by mouth daily What changed:   how much to take  how to take this  when to take this   psyllium 0.52 g capsule Commonly known as: REGULOID Take 2.6-3.12  g by mouth 2 (two) times daily.   traZODone 150 MG tablet Commonly known as: DESYREL Take 150 mg by mouth at bedtime.   triamterene-hydrochlorothiazide 37.5-25 MG tablet Commonly known as: MAXZIDE-25 Take 1 tablet by mouth daily.      Follow-up Information    Alexis Frock, MD On 05/16/2019.   Specialty: Urology Why: at 3:30 for MD visit and catheter removal.   Contact information: [place] [address] [phone]           Signed: Alexis Frock 04/29/2019, 2:26 PM

## 2019-05-02 LAB — SURGICAL PATHOLOGY

## 2019-05-06 DIAGNOSIS — R310 Gross hematuria: Secondary | ICD-10-CM | POA: Diagnosis not present

## 2019-05-13 ENCOUNTER — Emergency Department (HOSPITAL_BASED_OUTPATIENT_CLINIC_OR_DEPARTMENT_OTHER)
Admission: EM | Admit: 2019-05-13 | Discharge: 2019-05-13 | Disposition: A | Discharge status: Home / Self Care | Payer: Medicare PPO | Attending: Emergency Medicine | Admitting: Emergency Medicine

## 2019-05-13 ENCOUNTER — Telehealth: Payer: Self-pay | Admitting: Internal Medicine

## 2019-05-13 ENCOUNTER — Encounter (HOSPITAL_BASED_OUTPATIENT_CLINIC_OR_DEPARTMENT_OTHER): Payer: Self-pay

## 2019-05-13 ENCOUNTER — Other Ambulatory Visit: Payer: Self-pay

## 2019-05-13 DIAGNOSIS — R531 Weakness: Secondary | ICD-10-CM | POA: Diagnosis not present

## 2019-05-13 DIAGNOSIS — E871 Hypo-osmolality and hyponatremia: Secondary | ICD-10-CM | POA: Diagnosis not present

## 2019-05-13 DIAGNOSIS — Z923 Personal history of irradiation: Secondary | ICD-10-CM | POA: Diagnosis not present

## 2019-05-13 DIAGNOSIS — Z7984 Long term (current) use of oral hypoglycemic drugs: Secondary | ICD-10-CM | POA: Insufficient documentation

## 2019-05-13 DIAGNOSIS — Z79899 Other long term (current) drug therapy: Secondary | ICD-10-CM | POA: Diagnosis not present

## 2019-05-13 DIAGNOSIS — E119 Type 2 diabetes mellitus without complications: Secondary | ICD-10-CM | POA: Insufficient documentation

## 2019-05-13 DIAGNOSIS — Z8505 Personal history of malignant neoplasm of liver: Secondary | ICD-10-CM | POA: Diagnosis not present

## 2019-05-13 DIAGNOSIS — R42 Dizziness and giddiness: Secondary | ICD-10-CM | POA: Diagnosis not present

## 2019-05-13 DIAGNOSIS — E039 Hypothyroidism, unspecified: Secondary | ICD-10-CM | POA: Insufficient documentation

## 2019-05-13 DIAGNOSIS — E860 Dehydration: Secondary | ICD-10-CM | POA: Insufficient documentation

## 2019-05-13 DIAGNOSIS — I10 Essential (primary) hypertension: Secondary | ICD-10-CM | POA: Insufficient documentation

## 2019-05-13 DIAGNOSIS — Z853 Personal history of malignant neoplasm of breast: Secondary | ICD-10-CM | POA: Diagnosis not present

## 2019-05-13 DIAGNOSIS — R55 Syncope and collapse: Secondary | ICD-10-CM | POA: Insufficient documentation

## 2019-05-13 DIAGNOSIS — Z85528 Personal history of other malignant neoplasm of kidney: Secondary | ICD-10-CM | POA: Diagnosis not present

## 2019-05-13 HISTORY — DX: Malignant neoplasm of unspecified kidney, except renal pelvis: C64.9

## 2019-05-13 LAB — BASIC METABOLIC PANEL
Anion gap: 10 (ref 5–15)
BUN: 15 mg/dL (ref 8–23)
CO2: 26 mmol/L (ref 22–32)
Calcium: 8.6 mg/dL — ABNORMAL LOW (ref 8.9–10.3)
Chloride: 90 mmol/L — ABNORMAL LOW (ref 98–111)
Creatinine, Ser: 1.11 mg/dL — ABNORMAL HIGH (ref 0.44–1.00)
GFR calc Af Amer: 56 mL/min — ABNORMAL LOW (ref >60)
GFR calc non Af Amer: 48 mL/min — ABNORMAL LOW (ref >60)
Glucose, Bld: 109 mg/dL — ABNORMAL HIGH (ref 70–99)
Potassium: 3.7 mmol/L (ref 3.5–5.1)
Sodium: 126 mmol/L — ABNORMAL LOW (ref 135–145)

## 2019-05-13 LAB — CBC WITH DIFFERENTIAL/PLATELET
Abs Immature Granulocytes: 0.05 10*3/uL (ref 0.00–0.07)
Basophils Absolute: 0.1 10*3/uL (ref 0.0–0.1)
Basophils Relative: 1 %
Eosinophils Absolute: 0.1 10*3/uL (ref 0.0–0.5)
Eosinophils Relative: 1 %
HCT: 35 % — ABNORMAL LOW (ref 36.0–46.0)
Hemoglobin: 11.6 g/dL — ABNORMAL LOW (ref 12.0–15.0)
Immature Granulocytes: 0 %
Lymphocytes Relative: 8 %
Lymphs Abs: 1 10*3/uL (ref 0.7–4.0)
MCH: 30.1 pg (ref 26.0–34.0)
MCHC: 33.1 g/dL (ref 30.0–36.0)
MCV: 90.7 fL (ref 80.0–100.0)
Monocytes Absolute: 0.8 10*3/uL (ref 0.1–1.0)
Monocytes Relative: 6 %
Neutro Abs: 10.1 10*3/uL — ABNORMAL HIGH (ref 1.7–7.7)
Neutrophils Relative %: 84 %
Platelets: 548 10*3/uL — ABNORMAL HIGH (ref 150–400)
RBC: 3.86 MIL/uL — ABNORMAL LOW (ref 3.87–5.11)
RDW: 13.2 % (ref 11.5–15.5)
WBC: 12 10*3/uL — ABNORMAL HIGH (ref 4.0–10.5)
nRBC: 0 % (ref 0.0–0.2)

## 2019-05-13 LAB — MAGNESIUM: Magnesium: 1.8 mg/dL (ref 1.7–2.4)

## 2019-05-13 MED ORDER — SODIUM CHLORIDE 0.9 % IV BOLUS
1000.0000 mL | Freq: Once | INTRAVENOUS | Status: AC
  Administered 2019-05-13: 18:00:00 1000 mL via INTRAVENOUS

## 2019-05-13 NOTE — Telephone Encounter (Signed)
STAT if patient feels like he/she is going to faint   1) Are you dizzy now? no  2) Do you feel faint or have you passed out? Passed out this morning about 45 minutes ago.   3) Do you have any other symptoms? no  4) Have you checked your HR and BP (record if available)? 110/72 HR 66 this morning after fainting and drinking water  Patient states she fainted this morning about 45 minutes ago when she stood up from going to the bathroom. She states does not feel dizzy for lightheaded now. She states she is sitting and drank some water. She states she is unsure if she is dehydrated and that's why she fainted. She states she has had trouble passing urine lately, but last night was urinating for frequently. Please advise.

## 2019-05-13 NOTE — Discharge Instructions (Signed)
See your kidney doctor on Monday.  Tell them they need to recheck your lab work because your sodium was low here today.  Eat and drink as well as you can for the next 48 hours.  Please return for repeat event if you start having chest pain or trouble breathing or if you are unable to eat and drink.  The cardiology office should get in touch with you on Monday as well to see about wearing a monitor around the house.

## 2019-05-13 NOTE — ED Provider Notes (Signed)
[facility] EMERGENCY DEPARTMENT Provider Note   CSN: [id number] Arrival date & time: 05/13/19  1724     History Chief Complaint  Patient presents with  . Loss of Consciousness    Cynthia [name] is a 74 y.o. female.  74 yo F with a chief complaints of a syncopal event.  The patient states that she has been feeling a little bit agitated for the past couple days.  She thinks that is because she had one of her kidneys removed recently.  States that she got up to go to the bathroom a few times last night without issue and then this morning had to go to the bathroom when she got up she felt a bit lightheaded use the restroom and then when she stood up she felt lightheaded and ended up falling on the ground.  She denies any injury in the fall.  States that she felt very weak afterwards.  Now feels back to baseline.  She denied chest pain or shortness of breath denied cough congestion or fever denied nausea vomiting or diarrhea denied dark stool or blood in her stool.  Denied recent medication change.  Patient states that she has been eating and drinking a little bit less and she is not sure why.  Denies abdominal pain.  She does have a history of feeling lightheaded frequently.  Was attributed to a cardiac arrhythmia in the past.  She had a hospitalization where she was then A. fib with aberrancy versus ventricular tachycardia.  The history is provided by the patient.  Loss of Consciousness Episode history:  Single Most recent episode:  Today Duration:  5 minutes Timing:  Rare Progression:  Resolved Chronicity:  Recurrent Witnessed: yes   Relieved by:  Nothing Worsened by:  Nothing Ineffective treatments:  None tried Associated symptoms: no chest pain, no dizziness, no fever, no headaches, no nausea, no palpitations, no shortness of breath and no vomiting        Past Medical History:  Diagnosis Date  . Anticoagulant long-term use    eliquis--- managed by cardiology  .  Anxiety   . Arthritis   . Bradycardia   . Cancer of kidney ([place])   . DDD (degenerative disc disease), lumbar   . Depression   . Dyspnea    ON EXERTION  . First degree heart block   . Genetic testing 06/25/2016   Ms. Macpherson underwent genetic counseling and testing for hereditary cancer syndromes on 06/17/2016. Her results were negative for mutations in all 46 genes analyzed by [facility]'s 46-gene Common Hereditary Cancers Panel. Genes analyzed include: APC, ATM, AXIN2, BARD1, BMPR1A, BRCA1, BRCA2, BRIP1, CDH1, CDKN2A, CHEK2, CTNNA1, DICER1, EPCAM, GREM1, HOXB13, KIT, MEN1, MLH1, MSH2, MSH3, MSH6, MUTYH, NBN,  . GERD (gastroesophageal reflux disease)    occasional,  takes pepcid  . Hematuria   . History of radiation therapy 05/22/2016 to 06/20/2016   right breast cancer  . Hypertension    followd by pcp---  (02-22-2019 per pt never had stress test)  . Hypothyroidism    followed by pcp  . IBS (irritable bowel syndrome)   . Incomplete right bundle branch block   . Insomnia   . Liver cancer ([place])   . Malignant neoplasm of upper-inner quadrant of right breast in female, estrogen receptor positive [facility]) oncologist--- dr [name]   dx 03/ 2018,  Stage IA, IDC, ER/PR +;  04-01-2016 s/p  right breast lumpectomy w/ node dissection's;  completed radiation 06-20-2016  . MVP (mitral valve prolapse)  per echo 12-11-2018 in epic, mild   . NSVT (nonsustained ventricular tachycardia) ([place]) followed by cardiology   12-10-2018  hospital admission ,  refer to discharge note 12-14-2018 for treatement  . PAF (paroxysmal atrial fibrillation) [facility]) primary cardiologist--- dr Margaretann Loveless   newly dx 12-10-2018  admission in epic, Afib w/ RVR and NSVT;   in epic TTE 12-11-2018 showed ef 50-55%, mild MVP,  mild AV sclerosis without stenosis;   Cardiac MRI 12-13-2018 in epic  . Renal mass, left    pelvis   . Restless leg syndrome   . Type 2 diabetes mellitus ([place])    followed by pcp---  (02-22-2019 does not check  blood sugar's)    Patient Active Problem List   Diagnosis Date Noted  . 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 New onset atrial fibrillation ([place]) 12/10/2018  . HTN (hypertension) 12/10/2018  . Diabetes type 2, controlled ([place]) 12/10/2018  . Hypothyroidism 12/10/2018  . Anxiety 12/10/2018  . Atrial fibrillation with RVR ([place]) 12/10/2018  . Genetic testing 06/25/2016  . Malignant neoplasm of upper-inner quadrant of right breast in female, estrogen receptor positive ([place]) 03/18/2016    Past Surgical History:  Procedure Laterality Date  . BREAST LUMPECTOMY WITH RADIOACTIVE SEED AND SENTINEL LYMPH NODE BIOPSY Right 04/01/2016   Procedure: RADIOACTIVE SEED GUIDED RIGHT BREAST LUMPECTOMY WITH RIGHT AXILLARY SENTINEL LYMPH NODE BIOPSY.;  Surgeon: Fanny Skates, MD;  Location: [facility];  Service: General;  Laterality: Right;  . CATARACT EXTRACTION W/ INTRAOCULAR LENS  IMPLANT, BILATERAL  1980s  . CYSTOSCOPY/RETROGRADE/URETEROSCOPY N/A 03/02/2019   Procedure: CYSTOSCOPY/LEFT RETROGRADE/URETEROSCOPY/  BIOPSY/  STENT;  Surgeon: Ceasar Mons, MD;  Location: [facility];  Service: Urology;  Laterality: N/A;  . LAPAROSCOPIC CHOLECYSTECTOMY  1990s  . ROBOT ASSITED LAPAROSCOPIC NEPHROURETERECTOMY Left 04/27/2019   Procedure: XI ROBOT ASSITED LAPAROSCOPIC NEPHROURETERECTOMY;  Surgeon: Alexis Frock, MD;  Location: [facility];  Service: Urology;  Laterality: Left;  3.5 HRS  . TONSILLECTOMY  age 2     OB History   No obstetric history on file.     Family History  Problem Relation Age of Onset  . Breast cancer Sister 107       d.54  . Leukemia Brother 14  . Cervical cancer Sister 2  . Ovarian cancer Maternal Aunt 92       d.92s  . Breast cancer Other 66    Social History   Tobacco Use  . Smoking status: Never Smoker  . Smokeless tobacco: Never Used  Substance Use Topics  . Alcohol use: Yes    Comment: socially  . Drug use: Never    Home Medications Prior to  Admission medications   Medication Sig Start Date End Date Taking? Authorizing Provider  ALPRAZolam Duanne Moron) 1 MG tablet Take 1 mg by mouth at bedtime.  12/09/18   [provider]  amiodarone (PACERONE) 200 MG tablet Take 1 tablet (200 mg total) by mouth daily. Patient taking differently: Take 200 mg by mouth daily.  01/04/19   Baldwin Jamaica, PA-C  anastrozole (ARIMIDEX) 1 MG tablet Take 1 tablet (1 mg total) by mouth daily. Patient taking differently: Take 1 mg by mouth daily.  09/22/18   Magrinat, [name], MD  Choline Fenofibrate (FENOFIBRIC ACID) 135 MG CPDR Take 135 mg by mouth daily.  01/16/16   [provider]  famotidine (PEPCID) 10 MG tablet Take 10 mg by mouth 2 (two) times daily.    [provider]  ferrous sulfate 325 (65  FE) MG tablet Take 325 mg by mouth daily with breakfast.     [provider]  HYDROcodone-acetaminophen (NORCO) 5-325 MG tablet Take 1-2 tablets by mouth every 6 (six) hours as needed for moderate pain. 04/27/19   Debbrah Alar, PA-C  imipramine (TOFRANIL) 50 MG tablet Take 50 mg by mouth at bedtime. 01/16/16   [provider]  levothyroxine (SYNTHROID, LEVOTHROID) 112 MCG tablet Take 112 mcg by mouth at bedtime.  03/13/16   [provider]  metFORMIN (GLUCOPHAGE) 500 MG tablet Take 500 mg by mouth 2 (two) times daily.  01/16/16   [provider]  metoprolol succinate (TOPROL-XL) 25 MG 24 hr tablet Take one tablet by mouth daily Patient taking differently: Take 25 mg by mouth daily. Take one tablet by mouth daily 02/14/19   Sherran Needs, NP  psyllium (REGULOID) 0.52 g capsule Take 2.6-3.12 g by mouth 2 (two) times daily.     [provider]  traZODone (DESYREL) 150 MG tablet Take 150 mg by mouth at bedtime. 03/01/16   [provider]  triamterene-hydrochlorothiazide (MAXZIDE-25) 37.5-25 MG tablet Take 1 tablet by mouth daily.    [provider]    Allergies    Patient has no known  allergies.  Review of Systems   Review of Systems  Constitutional: Negative for chills and fever.  HENT: Negative for congestion and rhinorrhea.   Eyes: Negative for redness and visual disturbance.  Respiratory: Negative for shortness of breath and wheezing.   Cardiovascular: Positive for syncope. Negative for chest pain and palpitations.  Gastrointestinal: Negative for nausea and vomiting.  Genitourinary: Negative for dysuria and urgency.  Musculoskeletal: Negative for arthralgias and myalgias.  Skin: Negative for pallor and wound.  Neurological: Positive for syncope. Negative for dizziness and headaches.    Physical Exam Updated Vital Signs BP 136/71 (BP Location: Right Arm) Comment: Simultaneous filing. User may not have seen previous data.  Pulse 60 Comment: Simultaneous filing. User may not have seen previous data.  Temp 97.6 F (36.4 C) (Oral)   Resp 15 Comment: Simultaneous filing. User may not have seen previous data.  Ht 5' 5"  (1.651 m)   Wt 73 kg   SpO2 99% Comment: Simultaneous filing. User may not have seen previous data.  BMI 26.79 kg/m   Physical Exam Vitals and nursing note reviewed.  Constitutional:      General: She is not in acute distress.    Appearance: She is well-developed. She is not diaphoretic.  HENT:     Head: Normocephalic and atraumatic.  Eyes:     Pupils: Pupils are equal, round, and reactive to light.  Cardiovascular:     Rate and Rhythm: Normal rate and regular rhythm.     Heart sounds: No murmur. No friction rub. No gallop.   Pulmonary:     Effort: Pulmonary effort is normal.     Breath sounds: No wheezing or rales.  Abdominal:     General: There is distension (mild).     Palpations: Abdomen is soft.     Tenderness: There is no abdominal tenderness.  Musculoskeletal:        General: No tenderness.     Cervical back: Normal range of motion and neck supple.  Skin:    General: Skin is warm and dry.  Neurological:     Mental Status:  She is alert and oriented to person, place, and time.  Psychiatric:        Behavior: Behavior normal.  ED Results / Procedures / Treatments   Labs (all labs ordered are listed, but only abnormal results are displayed) Labs Reviewed  CBC WITH DIFFERENTIAL/PLATELET - Abnormal; Notable for the following components:      Result Value   WBC 12.0 (*)    RBC 3.86 (*)    Hemoglobin 11.6 (*)    HCT 35.0 (*)    Platelets 548 (*)    Neutro Abs 10.1 (*)    All other components within normal limits  BASIC METABOLIC PANEL - Abnormal; Notable for the following components:   Sodium 126 (*)    Chloride 90 (*)    Glucose, Bld 109 (*)    Creatinine, Ser 1.11 (*)    Calcium 8.6 (*)    GFR calc non Af Amer 48 (*)    GFR calc Af Amer 56 (*)    All other components within normal limits  MAGNESIUM    EKG EKG Interpretation  Date/Time:  Friday May 13 2019 17:50:00 EDT Ventricular Rate:  60 PR Interval:    QRS Duration: 107 QT Interval:  446 QTC Calculation: 446 R Axis:   52 Text Interpretation: Sinus rhythm Atrial premature complex Prolonged PR interval no wpw prolonged qt or brugada Otherwise no significant change Confirmed by Deno Etienne [id number]) on 05/13/2019 5:56:25 PM   Radiology No results found.  Procedures Procedures (including critical care time)  Medications Ordered in ED Medications  sodium chloride 0.9 % bolus 1,000 mL (1,000 mLs Intravenous New Bag/Given 05/13/19 1825)    ED Course  I have reviewed the triage vital signs and the nursing notes.  Pertinent labs & imaging results that were available during my care of the patient were reviewed by me and considered in my medical decision making (see chart for details).    MDM Rules/Calculators/A&P                      74 yo F with a chief complaints of a syncopal event.  Sounds vasovagal versus orthostasis by history.  Patient unfortunately has a history of a complicated tach arrhythmia.  Lab work here is concerning for  dehydration which is consistent with her history.  Her sodium is 126 which is quite a bit below her baseline.  Given a bolus of IV fluids upon arrival.  Patient is nonsymptomatic as far as her sodium level.  Feeling much better and able to ambulate independently without issue.  Discussed inpatient versus outpatient therapy and she is requesting to go home.  I did discuss the case with Dr. Meda Coffee, cardiology.  She do not feel that her past history of complicated tachyarrhythmia would require her to come into the hospital especially based on her history of her present illness.  They will contact her on Monday for possible Holter.  Patient already has scheduled follow-up with her nephrologist on Monday.  I suggested that they recheck her labs.  8:04 PM:  I have discussed the diagnosis/risks/treatment options with the patient and believe the pt to be eligible for discharge home to follow-up with PCP, nephrology, cards. We also discussed returning to the ED immediately if new or worsening sx occur. We discussed the sx which are most concerning (e.g., sudden worsening pain, fever, inability to tolerate by mouth, repeat syncope, chest pain) that necessitate immediate return. Medications administered to the patient during their visit and any new prescriptions provided to the patient are listed below.  Medications given during this visit Medications  sodium chloride 0.9 %  bolus 1,000 mL (1,000 mLs Intravenous New Bag/Given 05/13/19 1825)     The patient appears reasonably screen and/or stabilized for discharge and I doubt any other medical condition or other [facility] requiring further screening, evaluation, or treatment in the ED at this time prior to discharge.   Final Clinical Impression(s) / ED Diagnoses Final diagnoses:  Syncope and collapse  Vasovagal syncope  Hyponatremia  Dehydration    Rx / DC Orders ED Discharge Orders    None       Deno Etienne, DO 05/13/19 2004

## 2019-05-13 NOTE — ED Triage Notes (Signed)
Pt states she passed out in her BR ~930am-pt states she had multiple near syncopal episodes since Dec 2020-was dx with "afib and vtach"-pt states she had left nephrectomy 2 weeks ago for kidney cancer-pt NAD-steady gait

## 2019-05-13 NOTE — Telephone Encounter (Signed)
Spoke with pt who states she fainted earlier this morning upon standing after going to the bathroom.  Pt states she had a nephrectomy 2 weeks ago and remains off her Eliquis.  Pt states she is lying down now.  She has only eaten a half of a banana today and has taken her Metformin.  Pt reports HR and B/P are WNL and she does not feel as if she is in AFIB.  She states she could possibly be dehydrated but unsure.  Pt denies CP, SOB or current dizziness.   Pt advised she should go to ED for further evaluation especially since she completely lost consciousness.  Pt states she does not want to go to ED but asks if she can go to [facility] ED as this is where she had her surgery.  Pt advised she needs evaluation and it is her choice what ED she goes to.  Pt asking about making an appointment with AFIB clinic.  Advised pt most likely any provider is going to recommend she go to ED at this point and time.  Instructed pt if she is opposed to going to ED she should at the very least contact her PCP about being seen.   Pt verbalizes understanding and agrees she will follow up d/t her syncopal episode.

## 2019-05-17 ENCOUNTER — Telehealth: Payer: Self-pay | Admitting: Internal Medicine

## 2019-05-17 NOTE — Telephone Encounter (Signed)
Pt c/o BP issue: STAT if pt c/o blurred vision, one-sided weakness or slurred speech  1. What are your last 5 BP readings?  5/10  100/66 11am   135/76 10:30pm  130/80 11:30pm 5/11 109/65 11:25am    2. Are you having any other symptoms (ex. Dizziness, headache, blurred vision, passed out)? Dizzy, lightheaded, she has fainted a couple of times.   3. What is your BP issue? She states she was put on a new BP medication by Dr. Caryl Comes. She states she did not take her BP medication yesterday or today and she feels much better today.  She states she takes a sleeping pill at night, 1mg  of Xanax.

## 2019-05-17 NOTE — Telephone Encounter (Signed)
Attempted phone call to pt.  Someone answered phone and then hung up.  Attempted call back and line was busy.  Will forward information to Dr Jens Som for review and recommendation.

## 2019-05-17 NOTE — Telephone Encounter (Signed)
I also tried to call her and got no answer   We can see her on 5/21   probably needs linq

## 2019-05-19 NOTE — Telephone Encounter (Signed)
Follow Up:     Pt was calling back to see what was decided from her call on 05-17-19

## 2019-05-20 NOTE — Telephone Encounter (Signed)
Spoke with pt and advised per Dr Caryl Comes pt should consider loop implant for monitoring purposes.  Pt states she has stopped her metoprolol at the recommendation of her renal MD d/t B/P being 104/60 at her last office appointment.  Pt states her B/P is now running around 120/70.  Pt is agreeable to loop implant.  Appointment scheduled for 05/27/2019 at 930am with Dr Caryl Comes.  Encouraged pt to continue daily monitoring of her B/P and bring readings with her to appointment.  Pt states she has started back on her Eliquis.  Pt verbalizes understanding and agrees with current plan.

## 2019-05-24 ENCOUNTER — Telehealth: Payer: Self-pay | Admitting: Internal Medicine

## 2019-05-24 NOTE — Telephone Encounter (Signed)
New Message:    Alvie Heidelberg from  Cardiovascular called. She says she needs to talk to somebody medical today to do a Peer to Peer for pt;s Loop Recorder.

## 2019-05-25 DIAGNOSIS — R350 Frequency of micturition: Secondary | ICD-10-CM | POA: Diagnosis not present

## 2019-05-25 DIAGNOSIS — R300 Dysuria: Secondary | ICD-10-CM | POA: Diagnosis not present

## 2019-05-27 ENCOUNTER — Encounter: Payer: Self-pay | Admitting: Internal Medicine

## 2019-05-27 ENCOUNTER — Other Ambulatory Visit: Payer: Self-pay

## 2019-05-27 ENCOUNTER — Ambulatory Visit: Payer: Medicare PPO | Admitting: Internal Medicine

## 2019-05-27 VITALS — BP 120/72 | HR 74 | Ht 65.0 in | Wt 159.0 lb

## 2019-05-27 DIAGNOSIS — E032 Hypothyroidism due to medicaments and other exogenous substances: Secondary | ICD-10-CM | POA: Diagnosis not present

## 2019-05-27 DIAGNOSIS — R55 Syncope and collapse: Secondary | ICD-10-CM | POA: Diagnosis not present

## 2019-05-27 DIAGNOSIS — Z79899 Other long term (current) drug therapy: Secondary | ICD-10-CM | POA: Diagnosis not present

## 2019-05-27 LAB — COMPREHENSIVE METABOLIC PANEL
ALT: 17 IU/L (ref 0–32)
AST: 26 IU/L (ref 0–40)
Albumin/Globulin Ratio: 1.5 (ref 1.2–2.2)
Albumin: 4.2 g/dL (ref 3.7–4.7)
Alkaline Phosphatase: 45 IU/L — ABNORMAL LOW (ref 48–121)
BUN/Creatinine Ratio: 13 (ref 12–28)
BUN: 15 mg/dL (ref 8–27)
Bilirubin Total: 0.4 mg/dL (ref 0.0–1.2)
CO2: 24 mmol/L (ref 20–29)
Calcium: 9.6 mg/dL (ref 8.7–10.3)
Chloride: 90 mmol/L — ABNORMAL LOW (ref 96–106)
Creatinine, Ser: 1.15 mg/dL — ABNORMAL HIGH (ref 0.57–1.00)
GFR calc Af Amer: 53 mL/min/{1.73_m2} — ABNORMAL LOW (ref >59)
GFR calc non Af Amer: 46 mL/min/{1.73_m2} — ABNORMAL LOW (ref >59)
Globulin, Total: 2.8 g/dL (ref 1.5–4.5)
Glucose: 83 mg/dL (ref 65–99)
Potassium: 3.6 mmol/L (ref 3.5–5.2)
Sodium: 128 mmol/L — ABNORMAL LOW (ref 134–144)
Total Protein: 7 g/dL (ref 6.0–8.5)

## 2019-05-27 LAB — TSH: TSH: 0.654 u[IU]/mL (ref 0.450–4.500)

## 2019-05-27 NOTE — Patient Instructions (Addendum)
Medication Instructions:  Your physician recommends that you continue on your current medications as directed. Please refer to the Current Medication list given to you today.  Labwork: TSH and CMET  Testing/Procedures: Implant of Loop Recorder today  Follow-Up: Your physician wants you to follow-up on 06/07/2019 at 930am here at [facility]. Office.  Remote monitoring is used to monitor your Pacemaker of ICD from home. This monitoring reduces the number of office visits required to check your device to one time per year. It allows [place] to keep an eye on the functioning of your device to ensure it is working properly.   Any Other Special Instructions Will Be Listed Below (If Applicable).  You may shower tomorrow night 05/28/2019 with the large bandage on.    Remove the large bandage on 06/01/2019 and leave the steri-strips on.  You will allow these to fall off.   Wound Care, Adult Taking care of your wound properly can help to prevent pain, infection, and scarring. It can also help your wound to heal more quickly. How to care for your wound Wound care      Follow instructions from your health care provider about how to take care of your wound. Make sure you: ? Wash your hands with soap and water before you change the bandage (dressing). If soap and water are not available, use hand sanitizer. ? Change your dressing as told by your health care provider. ? These skin closures may need to stay in place for 2 weeks or longer. If adhesive strip edges start to loosen and curl up, you may trim the loose edges. Do not remove adhesive strips completely unless your health care provider tells you to do that.  Check your wound area every day for signs of infection. Check for: ? Redness, swelling, or pain. ? Fluid or blood. ? Warmth. ? Pus or a bad smell.  Ask your health care provider if you should clean the wound with mild soap and water. Doing this may include: ? Using a clean towel to  pat the wound dry after cleaning it. Do not rub or scrub the wound. Covering the incision with a clean dressing.  Ask your health care provider when you can leave the wound uncovered.  Keep the dressing dry until your health care provider says it can be removed. Do not take baths, swim, use a hot tub, or do anything that would put the wound underwater until your health care provider approves. Ask your health care provider if you can take showers. You may only be allowed to take sponge baths. Medicines   If you were prescribed an antibiotic medicine, cream, or ointment, take or use the antibiotic as told by your health care provider. Do not stop taking or using the antibiotic even if your condition improves.  Take over-the-counter and prescription medicines only as told by your health care provider. If you were prescribed pain medicine, take it 30 or more minutes before you do any wound care or as told by your health care provider. General instructions  Return to your normal activities as told by your health care provider. Ask your health care provider what activities are safe.  Do not scratch or pick at the wound.  Do not use any products that contain nicotine or tobacco, such as cigarettes and e-cigarettes. These may delay wound healing. If you need help quitting, ask your health care provider.  Keep all follow-up visits as told by your health care provider. This is  important.  Eat a diet that includes protein, vitamin A, vitamin C, and other nutrient-rich foods to help the wound heal. ? Foods rich in protein include meat, dairy, beans, nuts, and other sources. ? Foods rich in vitamin A include carrots and dark green, leafy vegetables. ? Foods rich in vitamin C include citrus, tomatoes, and other fruits and vegetables. ? Nutrient-rich foods have protein, carbohydrates, fat, vitamins, or minerals. Eat a variety of healthy foods including vegetables, fruits, and whole grains. Contact a  health care provider if:  You received a tetanus shot and you have swelling, severe pain, redness, or bleeding at the injection site.  Your pain is not controlled with medicine.  You have redness, swelling, or pain around the wound.  You have fluid or blood coming from the wound.  Your wound feels warm to the touch.  You have pus or a bad smell coming from the wound.  You have a fever or chills.  You are nauseous or you vomit.  You are dizzy. Get help right away if:  You have a red streak going away from your wound.  The edges of the wound open up and separate.  Your wound is bleeding, and the bleeding does not stop with gentle pressure.  You have a rash.  You faint.  You have trouble breathing. Summary  Always wash your hands with soap and water before changing your bandage (dressing).  To help with healing, eat foods that are rich in protein, vitamin A, vitamin C, and other nutrients.  Check your wound every day for signs of infection. Contact your health care provider if you suspect that your wound is infected. This information is not intended to replace advice given to you by your health care provider. Make sure you discuss any questions you have with your health care provider. Document Revised: 04/12/2018 Document Reviewed: 07/10/2015 Elsevier Patient Education  [employer].  If you need a refill on your cardiac medications before your next appointment, please call your pharmacy.

## 2019-05-27 NOTE — Progress Notes (Signed)
Patient Care Team: [name], MD as PCP - General (Family Medicine) Elouise Munroe, MD as PCP - Cardiology (Cardiology) Fanny Skates, MD as Consulting Physician (General Surgery) Magrinat, [name], MD as Consulting Physician (Oncology) Kyung Rudd, MD as Consulting Physician (Radiation Oncology) Delice Bison Charlestine Massed, NP as Nurse Practitioner (Hematology and Oncology)   HPI  Cynthia [name] is a 74 y.o. female Seen for consideration of loop recorder insertion.  She has a history of near syncope in the context of wide-complex tachycardia.  She now has known and recurrent atrial fibrillation and fascicular VT for which she was ultimately treated with amiodarone.  Cardiac MRI suggested left ventricular noncompaction  She is also had some orthostatic hypotension  Recurrent syncope 5/21 occurring while standing going to the bathroom    LH is worse in the am or after long time on the couch  Nephrology had noted low blood pressures it is stopped her metoprolol.  Patient denies symptoms of GI intolerance, sun sensitivity, neurological symptoms attributable to amiodarone.      DATE TEST EF   12/20 Echo   50-55 %   12/20 cMRI  *52* %         Date Cr K Hgb TSH LFTs  12/20     2.85 21  5/21 1.11 3.7 11.9<<13.1         Records and Results Reviewed   Past Medical History:  Diagnosis Date  . Anticoagulant long-term use    eliquis--- managed by cardiology  . Anxiety   . Arthritis   . Bradycardia   . Cancer of kidney ([place])   . DDD (degenerative disc disease), lumbar   . Depression   . Dyspnea    ON EXERTION  . First degree heart block   . Genetic testing 06/25/2016   Cynthia [name] underwent genetic counseling and testing for hereditary cancer syndromes on 06/17/2016. Her results were negative for mutations in all 46 genes analyzed by [facility]'s 46-gene Common Hereditary Cancers Panel. Genes analyzed include: APC, ATM, AXIN2, BARD1, BMPR1A, BRCA1, BRCA2, BRIP1, CDH1,  CDKN2A, CHEK2, CTNNA1, DICER1, EPCAM, GREM1, HOXB13, KIT, MEN1, MLH1, MSH2, MSH3, MSH6, MUTYH, NBN,  . GERD (gastroesophageal reflux disease)    occasional,  takes pepcid  . Hematuria   . History of radiation therapy 05/22/2016 to 06/20/2016   right breast cancer  . Hypertension    followd by pcp---  (02-22-2019 per pt never had stress test)  . Hypothyroidism    followed by pcp  . IBS (irritable bowel syndrome)   . Incomplete right bundle branch block   . Insomnia   . Liver cancer ([place])   . Malignant neoplasm of upper-inner quadrant of right breast in female, estrogen receptor positive [facility]) oncologist--- dr [name]   dx 03/ 2018,  Stage IA, IDC, ER/PR +;  04-01-2016 s/p  right breast lumpectomy w/ node dissection's;  completed radiation 06-20-2016  . MVP (mitral valve prolapse)    per echo 12-11-2018 in epic, mild   . NSVT (nonsustained ventricular tachycardia) ([place]) followed by cardiology   12-10-2018  hospital admission ,  refer to discharge note 12-14-2018 for treatement  . PAF (paroxysmal atrial fibrillation) [facility]) primary cardiologist--- dr Margaretann Loveless   newly dx 12-10-2018  admission in epic, Afib w/ RVR and NSVT;   in epic TTE 12-11-2018 showed ef 50-55%, mild MVP,  mild AV sclerosis without stenosis;   Cardiac MRI 12-13-2018 in epic  . Renal mass, left    pelvis   .  Restless leg syndrome   . Type 2 diabetes mellitus ([place])    followed by pcp---  (02-22-2019 does not check blood sugar's)    Past Surgical History:  Procedure Laterality Date  . BREAST LUMPECTOMY WITH RADIOACTIVE SEED AND SENTINEL LYMPH NODE BIOPSY Right 04/01/2016   Procedure: RADIOACTIVE SEED GUIDED RIGHT BREAST LUMPECTOMY WITH RIGHT AXILLARY SENTINEL LYMPH NODE BIOPSY.;  Surgeon: Fanny Skates, MD;  Location: [place];  Service: General;  Laterality: Right;  . CATARACT EXTRACTION W/ INTRAOCULAR LENS  IMPLANT, BILATERAL  1980s  . CYSTOSCOPY/RETROGRADE/URETEROSCOPY N/A 03/02/2019   Procedure: CYSTOSCOPY/LEFT  RETROGRADE/URETEROSCOPY/  BIOPSY/  STENT;  Surgeon: Ceasar Mons, MD;  Location: [facility];  Service: Urology;  Laterality: N/A;  . LAPAROSCOPIC CHOLECYSTECTOMY  1990s  . ROBOT ASSITED LAPAROSCOPIC NEPHROURETERECTOMY Left 04/27/2019   Procedure: XI ROBOT ASSITED LAPAROSCOPIC NEPHROURETERECTOMY;  Surgeon: Alexis Frock, MD;  Location: [facility];  Service: Urology;  Laterality: Left;  3.5 HRS  . TONSILLECTOMY  age 59    Current Meds  Medication Sig  . ALPRAZolam (XANAX) 1 MG tablet Take 1 mg by mouth at bedtime.   Marland Kitchen amiodarone (PACERONE) 200 MG tablet Take 1 tablet (200 mg total) by mouth daily.  Marland Kitchen anastrozole (ARIMIDEX) 1 MG tablet Take 1 tablet (1 mg total) by mouth daily.  . Choline Fenofibrate (FENOFIBRIC ACID) 135 MG CPDR Take 135 mg by mouth daily.   Marland Kitchen ELIQUIS 5 MG TABS tablet Take 5 mg by mouth 2 (two) times daily.  . famotidine (PEPCID) 10 MG tablet Take 10 mg by mouth 2 (two) times daily.  . ferrous sulfate 325 (65 FE) MG tablet Take 325 mg by mouth daily with breakfast.   . imipramine (TOFRANIL) 50 MG tablet Take 50 mg by mouth at bedtime.  Marland Kitchen levothyroxine (SYNTHROID, LEVOTHROID) 112 MCG tablet Take 112 mcg by mouth at bedtime.   . metFORMIN (GLUCOPHAGE) 500 MG tablet Take 500 mg by mouth 2 (two) times daily.   . psyllium (REGULOID) 0.52 g capsule Take 2.6-3.12 g by mouth 2 (two) times daily.   . traZODone (DESYREL) 150 MG tablet Take 150 mg by mouth at bedtime.  . triamterene-hydrochlorothiazide (MAXZIDE-25) 37.5-25 MG tablet Take 1 tablet by mouth daily.    No Known Allergies    Review of Systems negative except from HPI and PMH  Physical Exam BP 120/72   Pulse 74   Ht 5' 5"  (1.651 m)   Wt 159 lb (72.1 kg)   SpO2 98%   BMI 26.46 kg/m  Well developed and well nourished in no acute distress HENT normal E scleral and icterus clear Neck Supple JVP flat; carotids brisk and full Clear   Regular rate and rhythm, no murmurs gallops or rub Soft  with active bowel sounds No clubbing cyanosis  Edema Alert and oriented, grossly normal motor and sensory function Skin Warm and Dry   Estimated Creatinine Clearance: 42.9 mL/min (A) (by C-G formula based on SCr of 1.11 mg/dL (H)).   Assessment and  Plan Atrial fibrillation with a rapid rate  Presyncope/syncope -probably orthostatic  Atrial fibrillation pauses/posttermination pauses  Wide-complex tachycardia-regular >> fasicular VT  Hypertension  With recurrent syncope and potentially contributing arrhythmia have recommended ILR for monitoring  We discussed the physiology of orthostatic intolerance including gravitational fluid shifts and the impact of hypertensive vascular disease on orthostasis and treatment options.  We discussed pharmacological options including midodrine, .  We discussed nonpharmacological options including raising the HOB, isometric contraction upon standing, abdominal binders  thigh sleeves. We emphasized the importance of recognizing the prodrome and sitting prior to falling, safety in the shower and in the bathroom and the avoidance of dehydration and the raising of HOB, am volume repletion, and isometric contraction  We will begin with a non pharmacological approach As above .   She is agreeable  Pre op Dx syncope Post op Dx Same  Procedure  Loop Recorder implantation  After routine prep and drape of the left parasternal area, a small incision was created. A Medtronic LINQ Reveal Loop Recorder  Serial Number  [id number]  was inserted.    SteriStrip dressing was  applied.  The patient tolerated the procedure without apparent complication.  EBL < 10cc     Current medicines are reviewed at length with the patient today .  The patient does not  have concerns regarding medicines.

## 2019-06-07 ENCOUNTER — Telehealth: Payer: Self-pay | Admitting: *Deleted

## 2019-06-07 ENCOUNTER — Ambulatory Visit (INDEPENDENT_AMBULATORY_CARE_PROVIDER_SITE_OTHER): Payer: Medicare PPO | Admitting: Emergency Medicine

## 2019-06-07 ENCOUNTER — Other Ambulatory Visit: Payer: Self-pay

## 2019-06-07 DIAGNOSIS — R55 Syncope and collapse: Secondary | ICD-10-CM

## 2019-06-07 LAB — CUP PACEART INCLINIC DEVICE CHECK
Date Time Interrogation Session: 20210601171645
Implantable Pulse Generator Implant Date: 20210521

## 2019-06-07 NOTE — Progress Notes (Signed)
ILR wound check in clinic. Steri strips removed. Wound well healed. Home monitor transmission not received. Will have patient sent from home today to troubleshoot monitor.1 AF episode that was 5 hrs , 8 minutes, coincides with period of dizziness and BLE weakness  on 06/05/19 between 11:30- 12 pm. + Eliquis. Questions answered.

## 2019-06-07 NOTE — Telephone Encounter (Signed)
Patient called for assistance with [employer] monitor. Unable to troubleshoot successfully. Prolonged hold times for tech services this afternoon, so patient agrees to call tech services tomorrow for assistance. Will check back later this week to ensure monitor is updating.

## 2019-06-08 DIAGNOSIS — R300 Dysuria: Secondary | ICD-10-CM | POA: Diagnosis not present

## 2019-06-08 DIAGNOSIS — R8279 Other abnormal findings on microbiological examination of urine: Secondary | ICD-10-CM | POA: Diagnosis not present

## 2019-06-10 NOTE — Telephone Encounter (Signed)
Spoke with patient. She reports that [employer] tech support was unable to troubleshoot her monitor, so she is currently awaiting a new monitor. Per [employer], it was shipped on 06/10/19. Pt agrees to call DC for assistance with setup when new monitor received. Direct number provided. Pt denies further questions at this time.

## 2019-06-16 ENCOUNTER — Other Ambulatory Visit: Payer: Self-pay

## 2019-06-16 ENCOUNTER — Telehealth: Payer: Self-pay | Admitting: Internal Medicine

## 2019-06-16 DIAGNOSIS — N289 Disorder of kidney and ureter, unspecified: Secondary | ICD-10-CM

## 2019-06-16 NOTE — Telephone Encounter (Signed)
New Message   Pt is calling and is wondering if she set up her device is correctly. She says she can get a call back today or tomorrow after 12 pm    Please call

## 2019-06-16 NOTE — Telephone Encounter (Signed)
Confirmed that transmission was received from monitor and no new findings.

## 2019-06-16 NOTE — Progress Notes (Addendum)
Order placed for repeat CMET per Dr Caryl Comes

## 2019-06-17 NOTE — Telephone Encounter (Signed)
Home monitor up to date as of 06/17/19.

## 2019-06-17 NOTE — Addendum Note (Signed)
Addended by: Thora Lance on: 06/17/2019 09:20 AM   Modules accepted: Orders

## 2019-06-29 ENCOUNTER — Ambulatory Visit (INDEPENDENT_AMBULATORY_CARE_PROVIDER_SITE_OTHER): Payer: Medicare PPO | Admitting: *Deleted

## 2019-06-29 DIAGNOSIS — R55 Syncope and collapse: Secondary | ICD-10-CM

## 2019-06-29 LAB — CUP PACEART REMOTE DEVICE CHECK
Date Time Interrogation Session: 20210623100858
Implantable Pulse Generator Implant Date: 20210521

## 2019-06-30 NOTE — Progress Notes (Signed)
[employer] Summary Report / Loop Recorder 

## 2019-07-01 ENCOUNTER — Telehealth: Payer: Self-pay | Admitting: Internal Medicine

## 2019-07-01 NOTE — Telephone Encounter (Signed)
STAT if patient feels like he/she is going to faint   1) Are you dizzy now? no  2) Do you feel faint or have you passed out? No. Pt laid down and feels better   3) Do you have any other symptoms? no  4) Have you checked your HR and BP (record if available)? HR 75  Pt just wanted to know if her home remote transmission caught the reason she just felt faint

## 2019-07-04 ENCOUNTER — Other Ambulatory Visit: Payer: Self-pay

## 2019-07-04 DIAGNOSIS — E559 Vitamin D deficiency, unspecified: Secondary | ICD-10-CM | POA: Diagnosis not present

## 2019-07-04 DIAGNOSIS — I10 Essential (primary) hypertension: Secondary | ICD-10-CM | POA: Diagnosis not present

## 2019-07-04 DIAGNOSIS — R7303 Prediabetes: Secondary | ICD-10-CM | POA: Diagnosis not present

## 2019-07-04 DIAGNOSIS — M25551 Pain in right hip: Secondary | ICD-10-CM | POA: Diagnosis not present

## 2019-07-04 DIAGNOSIS — M47816 Spondylosis without myelopathy or radiculopathy, lumbar region: Secondary | ICD-10-CM | POA: Diagnosis not present

## 2019-07-04 DIAGNOSIS — Z5181 Encounter for therapeutic drug level monitoring: Secondary | ICD-10-CM | POA: Diagnosis not present

## 2019-07-04 DIAGNOSIS — N289 Disorder of kidney and ureter, unspecified: Secondary | ICD-10-CM

## 2019-07-04 DIAGNOSIS — E785 Hyperlipidemia, unspecified: Secondary | ICD-10-CM | POA: Diagnosis not present

## 2019-07-04 DIAGNOSIS — F330 Major depressive disorder, recurrent, mild: Secondary | ICD-10-CM | POA: Diagnosis not present

## 2019-07-04 DIAGNOSIS — Q600 Renal agenesis, unilateral: Secondary | ICD-10-CM | POA: Diagnosis not present

## 2019-07-06 ENCOUNTER — Other Ambulatory Visit: Payer: Self-pay | Admitting: Physician Assistant

## 2019-07-06 DIAGNOSIS — C652 Malignant neoplasm of left renal pelvis: Secondary | ICD-10-CM | POA: Diagnosis not present

## 2019-07-06 DIAGNOSIS — Z905 Acquired absence of kidney: Secondary | ICD-10-CM | POA: Diagnosis not present

## 2019-07-07 ENCOUNTER — Other Ambulatory Visit: Payer: Self-pay | Admitting: Physician Assistant

## 2019-07-07 NOTE — Telephone Encounter (Signed)
Eliquis 5mg  refill request received. Patient is 74 years old, weight-72.1kg, Crea-1.15 on 05/27/2019, Diagnosis-Afib, and last seen by Dr. Caryl Comes on 05/27/2019. Dose is appropriate based on dosing criteria. Will send in refill to requested pharmacy.

## 2019-07-12 ENCOUNTER — Other Ambulatory Visit: Payer: Self-pay | Admitting: Family Medicine

## 2019-07-12 ENCOUNTER — Ambulatory Visit
Admission: RE | Admit: 2019-07-12 | Discharge: 2019-07-12 | Disposition: A | Discharge status: Home / Self Care | Payer: Medicare PPO | Source: Ambulatory Visit | Attending: Family Medicine | Admitting: Family Medicine

## 2019-07-12 DIAGNOSIS — M25551 Pain in right hip: Secondary | ICD-10-CM

## 2019-07-12 DIAGNOSIS — M4316 Spondylolisthesis, lumbar region: Secondary | ICD-10-CM | POA: Diagnosis not present

## 2019-07-12 DIAGNOSIS — M47816 Spondylosis without myelopathy or radiculopathy, lumbar region: Secondary | ICD-10-CM | POA: Diagnosis not present

## 2019-07-12 DIAGNOSIS — M47896 Other spondylosis, lumbar region: Secondary | ICD-10-CM

## 2019-07-12 DIAGNOSIS — M4186 Other forms of scoliosis, lumbar region: Secondary | ICD-10-CM | POA: Diagnosis not present

## 2019-07-12 DIAGNOSIS — M47817 Spondylosis without myelopathy or radiculopathy, lumbosacral region: Secondary | ICD-10-CM | POA: Diagnosis not present

## 2019-07-12 DIAGNOSIS — M1611 Unilateral primary osteoarthritis, right hip: Secondary | ICD-10-CM | POA: Diagnosis not present

## 2019-07-18 DIAGNOSIS — R55 Syncope and collapse: Secondary | ICD-10-CM | POA: Insufficient documentation

## 2019-07-19 ENCOUNTER — Other Ambulatory Visit: Payer: Self-pay

## 2019-07-19 ENCOUNTER — Ambulatory Visit: Payer: Medicare PPO | Admitting: Internal Medicine

## 2019-07-19 ENCOUNTER — Encounter: Payer: Self-pay | Admitting: Internal Medicine

## 2019-07-19 ENCOUNTER — Other Ambulatory Visit: Payer: Medicare PPO

## 2019-07-19 VITALS — BP 130/70 | HR 77 | Ht 65.0 in | Wt 155.0 lb

## 2019-07-19 DIAGNOSIS — I4891 Unspecified atrial fibrillation: Secondary | ICD-10-CM

## 2019-07-19 DIAGNOSIS — Z79899 Other long term (current) drug therapy: Secondary | ICD-10-CM | POA: Diagnosis not present

## 2019-07-19 DIAGNOSIS — R55 Syncope and collapse: Secondary | ICD-10-CM

## 2019-07-19 MED ORDER — AMIODARONE HCL 200 MG PO TABS: ORAL_TABLET | ORAL | 3 refills | Status: DC

## 2019-07-19 NOTE — Progress Notes (Signed)
Patient Care Team: [name], MD as PCP - General (Family Medicine) Elouise Munroe, MD as PCP - Cardiology (Cardiology) Fanny Skates, MD as Consulting Physician (General Surgery) Magrinat, [name], MD as Consulting Physician (Oncology) Kyung Rudd, MD as Consulting Physician (Radiation Oncology) Delice Bison, Charlestine Massed, NP as Nurse Practitioner (Hematology and Oncology)   HPI  Cynthia [name] is a 74 y.o. female Seen in followup for syncope w hx of WCT and known afib rx w Amio She has a history of near syncope in the context of wide-complex tachycardia.       Cardiac MRI suggested left ventricular noncompaction  She is also had some orthostatic hypotension  Recurrent syncope 5/21 occurring while standing going to the bathroom prior to loop recorder insertion  LH is worse in the am or after long time on the couch  Nephrology had noted low blood pressures it is stopped her metoprolol. And her blood pressure has been doing well.  Had an episode late May with atrial fibrillation.  No interval syncope or presyncope  Patient denies symptoms of GI intolerance, sun sensitivity, neurological symptoms attributable to amiodarone.    DATE TEST EF   12/20 Echo   50-55 %   12/20 cMRI  *52* %         Date Cr K Hgb TSH LFTs  12/20     2.85 21  5/21 1.11 3.7 11.9<<13.1      6/21 1.17 3.8  0.654      Records and Results Reviewed   Past Medical History:  Diagnosis Date  . Anticoagulant long-term use    eliquis--- managed by cardiology  . Anxiety   . Arthritis   . Bradycardia   . Cancer of kidney ([place])   . DDD (degenerative disc disease), lumbar   . Depression   . Dyspnea    ON EXERTION  . First degree heart block   . Genetic testing 06/25/2016   Ms. Repetto underwent genetic counseling and testing for hereditary cancer syndromes on 06/17/2016. Her results were negative for mutations in all 46 genes analyzed by [facility]'s 46-gene Common Hereditary Cancers Panel.  Genes analyzed include: APC, ATM, AXIN2, BARD1, BMPR1A, BRCA1, BRCA2, BRIP1, CDH1, CDKN2A, CHEK2, CTNNA1, DICER1, EPCAM, GREM1, HOXB13, KIT, MEN1, MLH1, MSH2, MSH3, MSH6, MUTYH, NBN,  . GERD (gastroesophageal reflux disease)    occasional,  takes pepcid  . Hematuria   . History of radiation therapy 05/22/2016 to 06/20/2016   right breast cancer  . Hypertension    followd by pcp---  (02-22-2019 per pt never had stress test)  . Hypothyroidism    followed by pcp  . IBS (irritable bowel syndrome)   . Incomplete right bundle branch block   . Insomnia   . Liver cancer ([place])   . Malignant neoplasm of upper-inner quadrant of right breast in female, estrogen receptor positive [facility]) oncologist--- dr [name]   dx 03/ 2018,  Stage IA, IDC, ER/PR +;  04-01-2016 s/p  right breast lumpectomy w/ node dissection's;  completed radiation 06-20-2016  . MVP (mitral valve prolapse)    per echo 12-11-2018 in epic, mild   . NSVT (nonsustained ventricular tachycardia) ([place]) followed by cardiology   12-10-2018  hospital admission ,  refer to discharge note 12-14-2018 for treatement  . PAF (paroxysmal atrial fibrillation) [facility]) primary cardiologist--- dr Margaretann Loveless   newly dx 12-10-2018  admission in epic, Afib w/ RVR and NSVT;   in epic TTE 12-11-2018 showed ef 50-55%, mild MVP,  mild AV sclerosis without stenosis;   Cardiac MRI 12-13-2018 in epic  . Renal mass, left    pelvis   . Restless leg syndrome   . Type 2 diabetes mellitus ([facility])    followed by pcp---  (02-22-2019 does not check blood sugar's)    Past Surgical History:  Procedure Laterality Date  . BREAST LUMPECTOMY WITH RADIOACTIVE SEED AND SENTINEL LYMPH NODE BIOPSY Right 04/01/2016   Procedure: RADIOACTIVE SEED GUIDED RIGHT BREAST LUMPECTOMY WITH RIGHT AXILLARY SENTINEL LYMPH NODE BIOPSY.;  Surgeon: Haywood Ingram, MD;  Location: [facility];  Service: General;  Laterality: Right;  . CATARACT EXTRACTION W/ INTRAOCULAR LENS  IMPLANT, BILATERAL  1980s  .  CYSTOSCOPY/RETROGRADE/URETEROSCOPY N/A 03/02/2019   Procedure: CYSTOSCOPY/LEFT RETROGRADE/URETEROSCOPY/  BIOPSY/  STENT;  Surgeon: Winter, Christopher Aaron, MD;  Location: [facility];  Service: Urology;  Laterality: N/A;  . LAPAROSCOPIC CHOLECYSTECTOMY  1990s  . ROBOT ASSITED LAPAROSCOPIC NEPHROURETERECTOMY Left 04/27/2019   Procedure: XI ROBOT ASSITED LAPAROSCOPIC NEPHROURETERECTOMY;  Surgeon: Manny, Theodore, MD;  Location: [facility];  Service: Urology;  Laterality: Left;  3.5 HRS  . TONSILLECTOMY  age 5    Current Meds  Medication Sig  . ALPRAZolam (XANAX) 1 MG tablet Take 1 mg by mouth at bedtime.   . amiodarone (PACERONE) 200 MG tablet TAKE 1 TABLET BY MOUTH EVERY DAY  . anastrozole (ARIMIDEX) 1 MG tablet Take 1 tablet (1 mg total) by mouth daily.  . Choline Fenofibrate (FENOFIBRIC ACID) 135 MG CPDR Take 135 mg by mouth daily.   . ELIQUIS 5 MG TABS tablet TAKE 1 TABLET BY MOUTH TWICE A DAY  . famotidine (PEPCID) 10 MG tablet Take 10 mg by mouth 2 (two) times daily.  . ferrous sulfate 325 (65 FE) MG tablet Take 325 mg by mouth daily with breakfast.   . imipramine (TOFRANIL) 50 MG tablet Take 50 mg by mouth at bedtime.  . levothyroxine (SYNTHROID, LEVOTHROID) 112 MCG tablet Take 112 mcg by mouth at bedtime.   . metFORMIN (GLUCOPHAGE) 500 MG tablet Take 500 mg by mouth 2 (two) times daily.   . mirabegron ER (MYRBETRIQ) 25 MG TB24 tablet Take 25 mg by mouth daily.  . psyllium (REGULOID) 0.52 g capsule Take 2.6-3.12 g by mouth 2 (two) times daily.   . traMADol (ULTRAM) 50 MG tablet Take 1 tablet by mouth daily as needed for pain.  . traZODone (DESYREL) 150 MG tablet Take 150 mg by mouth at bedtime.  . triamterene-hydrochlorothiazide (MAXZIDE-25) 37.5-25 MG tablet Take 1 tablet by mouth daily.    No Known Allergies    Review of Systems negative except from HPI and PMH  Physical Exam BP 130/70   Pulse 77   Ht 5' 5" (1.651 m)   Wt 155 lb (70.3 kg)   SpO2 97%   BMI  25.79 kg/m  Well developed and well nourished in no acute distress HENT normal E scleral and icterus clear Neck Supple JVP flat; carotids brisk and full Clear   Regular rate and rhythm, no murmurs gallops or rub Soft with active bowel sounds No clubbing cyanosis  Edema Alert and oriented, grossly normal motor and sensory function Skin Warm and Dry  ECG demonstrates sinus at 69 Interval 21/10/41  Assessment and  Plan Atrial fibrillation with a rapid rate  Presyncope/syncope -probably orthostatic  Atrial fibrillation pauses/posttermination pauses  Wide-complex tachycardia-regular >> fasicular VT  Hypertension  Dysgraphia  Psychosocial stress-husband with brain cancer   No interval syncope.  We will continue to   monitor.  Presyncope has not shown to correlate with syncope on monitoring.  Has had some atrial fibrillation with rapid rates.  Not associated with lightheadedness.  Continue to monitor.  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  On Anticoagulation;  No bleeding issues     pwp    Current medicines are reviewed at length with the patient today .  The patient does not  have concerns regarding medicines.

## 2019-07-19 NOTE — Patient Instructions (Signed)
Medication Instructions:  Your physician has recommended you make the following change in your medication:   Decrease your Amiodarone 200mg  to 1 tablet by mouth Monday - Friday.   Labwork: None ordered.  Testing/Procedures: None ordered.  Follow-Up: Your physician wants you to follow-up in: 12 months with Dr Caryl Comes. You will receive a reminder letter in the mail two months in advance. If you don't receive a letter, please call our office to schedule the follow-up appointment.  Remote monitoring is used to monitor your Pacemaker of ICD from home. This monitoring reduces the number of office visits required to check your device to one time per year. It allows [place] to keep an eye on the functioning of your device to ensure it is working properly.    Any Other Special Instructions Will Be Listed Below (If Applicable).  If you need a refill on your cardiac medications before your next appointment, please call your pharmacy.

## 2019-07-20 LAB — HEPATIC FUNCTION PANEL
ALT: 25 IU/L (ref 0–32)
AST: 33 IU/L (ref 0–40)
Albumin: 4.2 g/dL (ref 3.7–4.7)
Alkaline Phosphatase: 47 IU/L — ABNORMAL LOW (ref 48–121)
Bilirubin Total: 0.3 mg/dL (ref 0.0–1.2)
Bilirubin, Direct: 0.17 mg/dL (ref 0.00–0.40)
Total Protein: 7 g/dL (ref 6.0–8.5)

## 2019-07-20 LAB — TSH: TSH: 0.606 u[IU]/mL (ref 0.450–4.500)

## 2019-07-21 ENCOUNTER — Telehealth: Payer: Self-pay | Admitting: Internal Medicine

## 2019-07-21 NOTE — Telephone Encounter (Signed)
Patient c/o Palpitations:  High priority if patient c/o lightheadedness, shortness of breath, or chest pain  1) How long have you had palpitations/irregular HR/ Afib? Are you having the symptoms now? Afib - No symptoms currently  2) Are you currently experiencing lightheadedness, SOB or CP? Lightheadedness  3) Do you have a history of afib (atrial fibrillation) or irregular heart rhythm? Yes  4) Have you checked your BP or HR? (document readings if available):  HR: 77   HR: 115       5) Are you experiencing any other symptoms? No   Patient states she took a 50 mg xanax last night, 07/20/19, around 6:00 PM and afib subsided.

## 2019-07-22 LAB — CUP PACEART INCLINIC DEVICE CHECK
Date Time Interrogation Session: 20210713150000
Implantable Pulse Generator Implant Date: 20210521

## 2019-07-22 NOTE — Telephone Encounter (Signed)
Transmission received. I told her the nurse will review the transmission and give her a call back.

## 2019-07-22 NOTE — Telephone Encounter (Signed)
LMOVM for pt to send a manual transmission.

## 2019-07-22 NOTE — Telephone Encounter (Signed)
2 new AF episodes noted on 7/14 at 19:53, duration 10 min, and 7/15 at 00:15, duration 16 min. Episode storage is programmed longest episode only. 2 symptom episodes, most recent from 07/20/19 at 12:34, ECG shows AF. See ECGs and Cardiac Compass below. Increase in avg V rate noted for past 2 days.

## 2019-07-25 NOTE — Telephone Encounter (Signed)
Noted On anticoagulation

## 2019-07-27 ENCOUNTER — Other Ambulatory Visit: Payer: Self-pay | Admitting: *Deleted

## 2019-07-27 DIAGNOSIS — C50211 Malignant neoplasm of upper-inner quadrant of right female breast: Secondary | ICD-10-CM

## 2019-08-01 ENCOUNTER — Ambulatory Visit (INDEPENDENT_AMBULATORY_CARE_PROVIDER_SITE_OTHER): Payer: Medicare PPO | Admitting: *Deleted

## 2019-08-01 DIAGNOSIS — R55 Syncope and collapse: Secondary | ICD-10-CM

## 2019-08-02 LAB — CUP PACEART REMOTE DEVICE CHECK
Date Time Interrogation Session: 20210726101329
Implantable Pulse Generator Implant Date: 20210521

## 2019-08-04 NOTE — Progress Notes (Signed)
[employer] Summary Report / Loop Recorder 

## 2019-08-17 DIAGNOSIS — R351 Nocturia: Secondary | ICD-10-CM | POA: Diagnosis not present

## 2019-08-17 DIAGNOSIS — R350 Frequency of micturition: Secondary | ICD-10-CM | POA: Diagnosis not present

## 2019-08-17 DIAGNOSIS — R300 Dysuria: Secondary | ICD-10-CM | POA: Diagnosis not present

## 2019-08-24 DIAGNOSIS — Z853 Personal history of malignant neoplasm of breast: Secondary | ICD-10-CM | POA: Diagnosis not present

## 2019-08-24 DIAGNOSIS — R928 Other abnormal and inconclusive findings on diagnostic imaging of breast: Secondary | ICD-10-CM | POA: Diagnosis not present

## 2019-09-04 LAB — CUP PACEART REMOTE DEVICE CHECK
Date Time Interrogation Session: 20210828101739
Implantable Pulse Generator Implant Date: 20210521

## 2019-09-05 ENCOUNTER — Ambulatory Visit (INDEPENDENT_AMBULATORY_CARE_PROVIDER_SITE_OTHER): Payer: Medicare PPO | Admitting: *Deleted

## 2019-09-05 DIAGNOSIS — R55 Syncope and collapse: Secondary | ICD-10-CM

## 2019-09-07 NOTE — Progress Notes (Signed)
[employer] Summary Report / Loop Recorder 

## 2019-09-13 DIAGNOSIS — M419 Scoliosis, unspecified: Secondary | ICD-10-CM | POA: Diagnosis not present

## 2019-09-15 ENCOUNTER — Telehealth: Payer: Self-pay

## 2019-09-15 NOTE — Telephone Encounter (Signed)
Called patient in regards to inform per Dr. Caryl Comes, she no longer need monthly remote monitoring.   Per Dr. Caryl Comes, the device will be checked on next OV. Follow up in 1 year from last visit.   No answer, LMOVM.

## 2019-09-16 NOTE — Telephone Encounter (Signed)
LMOM that Dr Caryl Comes is not in the office. Will reach out to Dr Caryl Comes with question.

## 2019-09-16 NOTE — Telephone Encounter (Signed)
The pt returning nurse call. I told her per the nurse phone note she no longer need monthly remote monitoring per Dr. Caryl Comes. The device can be checked on next OV. Follow up in 1 year from last visit. The pt asked when she go to [place] do she needs to take the home monitor in case she has an episode? I told her I will let the nurse give her a call back. She also wanted the nurse to explain to her about no longer needing to be monitored. She states if she is not home leave a detail message on the answering machine.

## 2019-09-19 NOTE — Telephone Encounter (Signed)
Cynthia [name]  you are correct  lets continue to monitor for syncope Thanks SK  s

## 2019-09-20 NOTE — Telephone Encounter (Signed)
LMOM for patient to call office.  Per Dr Caryl Comes she does need to continue to be monitored due to syncope.

## 2019-09-21 NOTE — Telephone Encounter (Signed)
I let the pt know per Jenny Reichmann, rn that Dr. Caryl Comes do want her to continued to be monitored due to syncope. She asked is it okay to take the monitor with her to [place]? I told her yes. All she has to do is plug the monitor up and it will work as normal. The pt verbalized understanding and thanked me for the help.

## 2019-09-22 ENCOUNTER — Inpatient Hospital Stay: Payer: Medicare PPO | Attending: Oncology | Admitting: Oncology

## 2019-09-22 ENCOUNTER — Inpatient Hospital Stay: Payer: Medicare PPO

## 2019-09-22 ENCOUNTER — Other Ambulatory Visit: Payer: Self-pay

## 2019-09-22 VITALS — BP 125/77 | HR 74 | Temp 97.1°F | Resp 18 | Ht 65.0 in | Wt 155.6 lb

## 2019-09-22 DIAGNOSIS — I10 Essential (primary) hypertension: Secondary | ICD-10-CM | POA: Diagnosis not present

## 2019-09-22 DIAGNOSIS — Z7984 Long term (current) use of oral hypoglycemic drugs: Secondary | ICD-10-CM | POA: Insufficient documentation

## 2019-09-22 DIAGNOSIS — I480 Paroxysmal atrial fibrillation: Secondary | ICD-10-CM | POA: Diagnosis not present

## 2019-09-22 DIAGNOSIS — M5136 Other intervertebral disc degeneration, lumbar region: Secondary | ICD-10-CM | POA: Diagnosis not present

## 2019-09-22 DIAGNOSIS — E119 Type 2 diabetes mellitus without complications: Secondary | ICD-10-CM | POA: Diagnosis not present

## 2019-09-22 DIAGNOSIS — E039 Hypothyroidism, unspecified: Secondary | ICD-10-CM | POA: Diagnosis not present

## 2019-09-22 DIAGNOSIS — Z803 Family history of malignant neoplasm of breast: Secondary | ICD-10-CM | POA: Diagnosis not present

## 2019-09-22 DIAGNOSIS — Z923 Personal history of irradiation: Secondary | ICD-10-CM | POA: Diagnosis not present

## 2019-09-22 DIAGNOSIS — K219 Gastro-esophageal reflux disease without esophagitis: Secondary | ICD-10-CM | POA: Diagnosis not present

## 2019-09-22 DIAGNOSIS — Z905 Acquired absence of kidney: Secondary | ICD-10-CM | POA: Diagnosis not present

## 2019-09-22 DIAGNOSIS — Z7901 Long term (current) use of anticoagulants: Secondary | ICD-10-CM | POA: Insufficient documentation

## 2019-09-22 DIAGNOSIS — I341 Nonrheumatic mitral (valve) prolapse: Secondary | ICD-10-CM | POA: Insufficient documentation

## 2019-09-22 DIAGNOSIS — G4700 Insomnia, unspecified: Secondary | ICD-10-CM | POA: Diagnosis not present

## 2019-09-22 DIAGNOSIS — R0602 Shortness of breath: Secondary | ICD-10-CM | POA: Insufficient documentation

## 2019-09-22 DIAGNOSIS — C50211 Malignant neoplasm of upper-inner quadrant of right female breast: Secondary | ICD-10-CM | POA: Insufficient documentation

## 2019-09-22 DIAGNOSIS — Z806 Family history of leukemia: Secondary | ICD-10-CM | POA: Insufficient documentation

## 2019-09-22 DIAGNOSIS — G2581 Restless legs syndrome: Secondary | ICD-10-CM | POA: Diagnosis not present

## 2019-09-22 DIAGNOSIS — I4891 Unspecified atrial fibrillation: Secondary | ICD-10-CM | POA: Insufficient documentation

## 2019-09-22 DIAGNOSIS — F418 Other specified anxiety disorders: Secondary | ICD-10-CM | POA: Insufficient documentation

## 2019-09-22 DIAGNOSIS — K589 Irritable bowel syndrome without diarrhea: Secondary | ICD-10-CM | POA: Insufficient documentation

## 2019-09-22 DIAGNOSIS — Z79899 Other long term (current) drug therapy: Secondary | ICD-10-CM | POA: Diagnosis not present

## 2019-09-22 DIAGNOSIS — Z170 Estrogen receptor positive status [ER+]: Secondary | ICD-10-CM | POA: Insufficient documentation

## 2019-09-22 DIAGNOSIS — C642 Malignant neoplasm of left kidney, except renal pelvis: Secondary | ICD-10-CM | POA: Insufficient documentation

## 2019-09-22 DIAGNOSIS — C689 Malignant neoplasm of urinary organ, unspecified: Secondary | ICD-10-CM | POA: Diagnosis not present

## 2019-09-22 DIAGNOSIS — Z79811 Long term (current) use of aromatase inhibitors: Secondary | ICD-10-CM | POA: Insufficient documentation

## 2019-09-22 LAB — CBC WITH DIFFERENTIAL/PLATELET
Abs Immature Granulocytes: 0.01 10*3/uL (ref 0.00–0.07)
Basophils Absolute: 0 10*3/uL (ref 0.0–0.1)
Basophils Relative: 1 %
Eosinophils Absolute: 0.6 10*3/uL — ABNORMAL HIGH (ref 0.0–0.5)
Eosinophils Relative: 11 %
HCT: 38.4 % (ref 36.0–46.0)
Hemoglobin: 12.2 g/dL (ref 12.0–15.0)
Immature Granulocytes: 0 %
Lymphocytes Relative: 27 %
Lymphs Abs: 1.5 10*3/uL (ref 0.7–4.0)
MCH: 30.1 pg (ref 26.0–34.0)
MCHC: 31.8 g/dL (ref 30.0–36.0)
MCV: 94.8 fL (ref 80.0–100.0)
Monocytes Absolute: 0.4 10*3/uL (ref 0.1–1.0)
Monocytes Relative: 7 %
Neutro Abs: 3 10*3/uL (ref 1.7–7.7)
Neutrophils Relative %: 54 %
Platelets: 333 10*3/uL (ref 150–400)
RBC: 4.05 MIL/uL (ref 3.87–5.11)
RDW: 13.8 % (ref 11.5–15.5)
WBC: 5.6 10*3/uL (ref 4.0–10.5)
nRBC: 0 % (ref 0.0–0.2)

## 2019-09-22 LAB — COMPREHENSIVE METABOLIC PANEL
ALT: 19 U/L (ref 0–44)
AST: 25 U/L (ref 15–41)
Albumin: 3.5 g/dL (ref 3.5–5.0)
Alkaline Phosphatase: 40 U/L (ref 38–126)
Anion gap: 6 (ref 5–15)
BUN: 20 mg/dL (ref 8–23)
CO2: 31 mmol/L (ref 22–32)
Calcium: 9.4 mg/dL (ref 8.9–10.3)
Chloride: 101 mmol/L (ref 98–111)
Creatinine, Ser: 1.24 mg/dL — ABNORMAL HIGH (ref 0.44–1.00)
GFR calc Af Amer: 49 mL/min — ABNORMAL LOW (ref >60)
GFR calc non Af Amer: 42 mL/min — ABNORMAL LOW (ref >60)
Glucose, Bld: 101 mg/dL — ABNORMAL HIGH (ref 70–99)
Potassium: 3.4 mmol/L — ABNORMAL LOW (ref 3.5–5.1)
Sodium: 138 mmol/L (ref 135–145)
Total Bilirubin: 0.4 mg/dL (ref 0.3–1.2)
Total Protein: 7 g/dL (ref 6.5–8.1)

## 2019-09-22 MED ORDER — ANASTROZOLE 1 MG PO TABS: 1.0000 mg | ORAL_TABLET | Freq: Every day | ORAL | 4 refills | Status: DC

## 2019-09-22 NOTE — Progress Notes (Signed)
Cynthia [name]  [phone] [phone]     ID: Cynthia [name] DOB: 21-Sep-1945  MR#: [id number]  EPP#:[id number]  Patient Care Team: Cynthia Small, MD as PCP - General (Family Medicine) Cynthia Munroe, MD as PCP - Cardiology (Cardiology) Cynthia Skates, MD as Consulting Physician (General Surgery) Cynthia [name], Cynthia Dad, MD as Consulting Physician (Oncology) Cynthia Rudd, MD as Consulting Physician (Radiation Oncology) Cynthia [name], Cynthia Massed, NP as Nurse Practitioner (Hematology and Oncology) Cynthia Frock, MD as Consulting Physician (Urology) Cynthia Broad, MD as Consulting Physician (Physical Medicine and Rehabilitation) Cynthia Cruel, MD OTHER MD:  CHIEF COMPLAINT: Estrogen receptor positive breast cancer  CURRENT TREATMENT: Anastrozole   INTERVAL HISTORY: Cynthia [name] returns today for follow-up of her estrogen receptor positive breast cancer.   She continues on anastrozole, which she tolerates without any significant side effects. Her most recent DEXA scan was at [facility] 11/02/2017 with a positive T score (1.4).  Since her last visit, she underwent bilateral diagnostic mammography with tomography at [facility] on 08/24/2019 showing: breast density category B; no evidence of malignancy in either breast.   She also underwent cystoscopy with stent placement and biopsy of a left renal mass on 03/02/2019 under Cynthia [name]. Pathology from the procedure ([id number]) showed: [name] fragments of high grade papillary urothelial carcinoma.  She proceeded to nephroureterectomy on 04/27/2019 under Dr. Tresa [name]. 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  The biopsied lymph node was benign (0/1).   REVIEW OF SYSTEMS: Cynthia [name] was found to have atrial fibrillation December 2020.  She is followed by Cynthia [name] for  that.  That appears to be well controlled.  She is on Eliquis with no significant bleeding issues.  She received the [place] vaccine x2 with no side effects.  Her husband is now being followed here by Dr. Mickeal [name] for his slow-growing astrocytoma.  She will have significant back pain.  She is seeing Dr. Nelva [name] for epidurals which she hopes will allow her to walk a little bit more.  Right now she is not exercising regularly.  A detailed review of systems was otherwise stable.   BREAST CANCER HISTORY: From the original intake note:  Cynthia [name] had bilateral screening mammography with tomosynthesis at [facility] 02/26/2016. There was a new area of asymmetry in the right breast at the 4:00 position. She was recalled for diagnostic mammography 03/07/2016. The breast density was category B. This found a new oval mass in the right breast upper inner quadrant with indistinct margins. Ultrasound of the right breast was performed the same day and confirmed a 0.6 cm oval mass in the right breast correlating with mammography findings. There was also a benign 3 mm cyst in the right breast upper inner quadrant middle depth  Biopsy of the right breast mass in question 03/10/2016 found ([id number]) invasive ductal carcinoma, grade 1, estrogen receptor 1 or percent positive, progesterone receptor 95% positive, both with strong staining intensity, with an MIB-1 of 10%, and no HER-2 implication, the signals ratio being 1.40 and the number per cell 2.10.  Her subsequent history is as detailed below.   PAST MEDICAL HISTORY: Past Medical History:  Diagnosis Date  . Anticoagulant long-term use    eliquis--- managed by cardiology  . Anxiety   . Arthritis   . Bradycardia   . Cancer of kidney ([place])   . DDD (degenerative disc disease), lumbar   . Depression   .  Dyspnea    ON EXERTION  . First degree heart block   . Genetic testing 06/25/2016   Ms. Alleva underwent genetic counseling and testing for hereditary cancer syndromes on  06/17/2016. Her results were negative for mutations in all 46 genes analyzed by [facility]'s 46-gene Common Hereditary Cancers Panel. Genes analyzed include: APC, ATM, AXIN2, BARD1, BMPR1A, BRCA1, BRCA2, BRIP1, CDH1, CDKN2A, CHEK2, CTNNA1, DICER1, EPCAM, GREM1, HOXB13, KIT, MEN1, MLH1, MSH2, MSH3, MSH6, MUTYH, NBN,  . GERD (gastroesophageal reflux disease)    occasional,  takes pepcid  . Hematuria   . History of radiation therapy 05/22/2016 to 06/20/2016   right breast cancer  . Hypertension    followd by pcp---  (02-22-2019 per pt never had stress test)  . Hypothyroidism    followed by pcp  . IBS (irritable bowel syndrome)   . Incomplete right bundle branch block   . Insomnia   . Liver cancer ([place])   . Malignant neoplasm of upper-inner quadrant of right breast in female, estrogen receptor positive [facility]) oncologist--- dr Cynthia [name]   dx 03/ 2018,  Stage IA, IDC, ER/PR +;  04-01-2016 s/p  right breast lumpectomy w/ node dissection's;  completed radiation 06-20-2016  . MVP (mitral valve prolapse)    per echo 12-11-2018 in epic, mild   . NSVT (nonsustained ventricular tachycardia) ([place]) followed by cardiology   12-10-2018  hospital admission ,  refer to discharge note 12-14-2018 for treatement  . PAF (paroxysmal atrial fibrillation) [facility]) primary cardiologist--- dr Margaretann Loveless   newly dx 12-10-2018  admission in epic, Afib w/ RVR and NSVT;   in epic TTE 12-11-2018 showed ef 50-55%, mild MVP,  mild AV sclerosis without stenosis;   Cardiac MRI 12-13-2018 in epic  . Renal mass, left    pelvis   . Restless leg syndrome   . Type 2 diabetes mellitus ([place])    followed by pcp---  (02-22-2019 does not check blood sugar's)    PAST SURGICAL HISTORY: Past Surgical History:  Procedure Laterality Date  . BREAST LUMPECTOMY WITH RADIOACTIVE SEED AND SENTINEL LYMPH NODE BIOPSY Right 04/01/2016   Procedure: RADIOACTIVE SEED GUIDED RIGHT BREAST LUMPECTOMY WITH RIGHT AXILLARY SENTINEL LYMPH NODE BIOPSY.;  Surgeon:  Cynthia Skates, MD;  Location: [facility];  Service: General;  Laterality: Right;  . CATARACT EXTRACTION W/ INTRAOCULAR LENS  IMPLANT, BILATERAL  1980s  . CYSTOSCOPY/RETROGRADE/URETEROSCOPY N/A 03/02/2019   Procedure: CYSTOSCOPY/LEFT RETROGRADE/URETEROSCOPY/  BIOPSY/  STENT;  Surgeon: Ceasar Mons, MD;  Location: [facility];  Service: Urology;  Laterality: N/A;  . LAPAROSCOPIC CHOLECYSTECTOMY  1990s  . ROBOT ASSITED LAPAROSCOPIC NEPHROURETERECTOMY Left 04/27/2019   Procedure: XI ROBOT ASSITED LAPAROSCOPIC NEPHROURETERECTOMY;  Surgeon: Cynthia Frock, MD;  Location: [facility];  Service: Urology;  Laterality: Left;  3.5 HRS  . TONSILLECTOMY  age 26    FAMILY HISTORY Family History  Problem Relation Age of Onset  . Breast cancer Sister 82       d.54  . Leukemia Brother 69  . Cervical cancer Sister 57  . Ovarian cancer Maternal Aunt 92       d.92s  . Breast cancer Other 85  Patient's father died at the age of 79 following a fall. He had a history of cerebral palsy. The patient's mother died from congestive heart failure at the age of 25. The patient had 2 sisters. One was diagnosed with colon cancer at age 10. She survives. The second one died from breast cancer diagnosed age 27. She neglected the disease and  it may have been present before she was 36. One of the patient's brothers was diagnosed with leukemia at age 44. The patient also has one niece diagnosed with breast cancer in her early 37s   GYNECOLOGIC HISTORY:  No LMP recorded. Patient is postmenopausal.  Menarche age 35, first live birth age 1, the patient is Cynthia [name] P2, she stopped having periods about 20 years ago, didn't take hormone replacement for many years, stopping approximately 2005.   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 Her husband Cynthia [name] (goes by "Cynthia [name]") was in [employer], worked in the police department until he was run over by car, then worked for the [employer] on [employer] side, but is now retired. They have been married 42 years as of 2018. Cynthia [name] has a daughter, Cynthia [name], from an earlier marriage. 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 Zuriel and Cynthia [name] share a son, Cynthia [name], who lives in [place], and works for [employer]. The patient has 2 grandchildren. She attends a [employer].    ADVANCED DIRECTIVES: In place   HEALTH MAINTENANCE: Social History   Tobacco Use  . Smoking status: Never Smoker  . Smokeless tobacco: Never Used  Vaping Use  . Vaping Use: Never used  Substance Use Topics  . Alcohol use: Yes    Comment: socially  . Drug use: Never     Colonoscopy:2012, in [place]  PAP: 2012   Bone density: 2015: Normal   No Known Allergies  Current Outpatient Medications  Medication Sig Dispense Refill  . ALPRAZolam (XANAX) 1 MG tablet Take 1 mg by mouth at bedtime.     Marland Kitchen amiodarone (PACERONE) 200 MG tablet Take 1 tablet (200 mg total) by mouth daily. Take 1 tablet by mouth Monday - Friday 90 tablet 3  . anastrozole (ARIMIDEX) 1 MG tablet Take 1 tablet (1 mg total) by mouth daily. 90 tablet 4  . Choline Fenofibrate (FENOFIBRIC ACID) 135 MG CPDR Take 135 mg by mouth daily.     Marland Kitchen ELIQUIS 5 MG TABS tablet TAKE 1 TABLET BY MOUTH TWICE A DAY 60 tablet 10  . famotidine (PEPCID) 10 MG tablet Take 10 mg by mouth 2 (two) times daily.    . ferrous sulfate 325 (65 FE) MG tablet Take 325 mg by mouth daily with breakfast.     . imipramine (TOFRANIL) 50 MG tablet Take 50 mg by mouth at bedtime.    Marland Kitchen levothyroxine (SYNTHROID, LEVOTHROID) 112 MCG tablet Take 112 mcg by mouth at bedtime.     . metFORMIN (GLUCOPHAGE) 500 MG tablet Take 500 mg by mouth 2 (two) times daily.     . psyllium (REGULOID) 0.52 g capsule Take 2.6-3.12 g by mouth 2 (two) times daily.     . traMADol (ULTRAM) 50 MG tablet Take 1 tablet by mouth daily as needed for pain.    . traZODone (DESYREL) 150 MG tablet Take  0.5 tablets (75 mg total) by mouth at bedtime.    . triamterene-hydrochlorothiazide (MAXZIDE-25) 37.5-25 MG tablet Take 1 tablet by mouth daily.     No current facility-administered medications for this visit.    OBJECTIVE: White woman who appears stated age  38:   09/22/19 1518  BP: 125/77  Pulse: 74  Resp: 18  Temp: (!) 97.1 F (36.2 C)  SpO2: 100%     Body mass index is 25.89 kg/m.    ECOG FS:0 -  Asymptomatic  Sclerae unicteric, EOMs intact Wearing a mask No cervical or supraclavicular adenopathy Lungs no rales or rhonchi Heart regular rate and rhythm Abd soft, nontender, positive bowel sounds MSK no focal spinal tenderness, no upper extremity lymphedema Neuro: nonfocal, well oriented, appropriate affect Breasts: She is status post right lumpectomy and radiation, with no evidence of disease recurrence.  The left breast and both axillae are unremarkable.   LAB RESULTS:  CMP     Component Value Date/Time   NA 138 09/22/2019 1458   NA 128 (L) 05/27/2019 1151   NA 141 03/19/2016 1216   K 3.4 (L) 09/22/2019 1458   K 3.5 03/19/2016 1216   CL 101 09/22/2019 1458   CO2 31 09/22/2019 1458   CO2 29 03/19/2016 1216   GLUCOSE 101 (H) 09/22/2019 1458   GLUCOSE 109 03/19/2016 1216   BUN 20 09/22/2019 1458   BUN 15 05/27/2019 1151   BUN 19.2 03/19/2016 1216   CREATININE 1.24 (H) 09/22/2019 1458   CREATININE 0.89 07/07/2017 1333   CREATININE 1.4 (H) 03/19/2016 1216   CALCIUM 9.4 09/22/2019 1458   CALCIUM 9.9 03/19/2016 1216   PROT 7.0 09/22/2019 1458   PROT 7.0 07/19/2019 1423   PROT 7.2 03/19/2016 1216   ALBUMIN 3.5 09/22/2019 1458   ALBUMIN 4.2 07/19/2019 1423   ALBUMIN 3.7 03/19/2016 1216   AST 25 09/22/2019 1458   AST 16 07/07/2017 1333   AST 15 03/19/2016 1216   ALT 19 09/22/2019 1458   ALT 11 07/07/2017 1333   ALT 10 03/19/2016 1216   ALKPHOS 40 09/22/2019 1458   ALKPHOS 45 03/19/2016 1216   BILITOT 0.4 09/22/2019 1458   BILITOT 0.3 07/19/2019 1423    BILITOT 0.4 07/07/2017 1333   BILITOT 0.42 03/19/2016 1216   GFRNONAA 42 (L) 09/22/2019 1458   GFRNONAA >60 07/07/2017 1333   GFRAA 49 (L) 09/22/2019 1458   GFRAA >60 07/07/2017 1333    INo results found for: SPEP, UPEP  Lab Results  Component Value Date   WBC 5.6 09/22/2019   NEUTROABS 3.0 09/22/2019   HGB 12.2 09/22/2019   HCT 38.4 09/22/2019   MCV 94.8 09/22/2019   PLT 333 09/22/2019      Chemistry      Component Value Date/Time   NA 138 09/22/2019 1458   NA 128 (L) 05/27/2019 1151   NA 141 03/19/2016 1216   K 3.4 (L) 09/22/2019 1458   K 3.5 03/19/2016 1216   CL 101 09/22/2019 1458   CO2 31 09/22/2019 1458   CO2 29 03/19/2016 1216   BUN 20 09/22/2019 1458   BUN 15 05/27/2019 1151   BUN 19.2 03/19/2016 1216   CREATININE 1.24 (H) 09/22/2019 1458   CREATININE 0.89 07/07/2017 1333   CREATININE 1.4 (H) 03/19/2016 1216      Component Value Date/Time   CALCIUM 9.4 09/22/2019 1458   CALCIUM 9.9 03/19/2016 1216   ALKPHOS 40 09/22/2019 1458   ALKPHOS 45 03/19/2016 1216   AST 25 09/22/2019 1458   AST 16 07/07/2017 1333   AST 15 03/19/2016 1216   ALT 19 09/22/2019 1458   ALT 11 07/07/2017 1333   ALT 10 03/19/2016 1216   BILITOT 0.4 09/22/2019 1458   BILITOT 0.3 07/19/2019 1423   BILITOT 0.4 07/07/2017 1333   BILITOT 0.42 03/19/2016 1216       No results found for: LABCA2  No components found for: LABCA125  No results for input(s): INR in the last 168 hours.  Urinalysis  Component Value Date/Time   COLORURINE YELLOW 12/10/2018 2015   APPEARANCEUR CLEAR 12/10/2018 2015   LABSPEC 1.011 12/10/2018 2015   PHURINE 6.0 12/10/2018 2015   GLUCOSEU NEGATIVE 12/10/2018 2015   HGBUR [name] (A) 12/10/2018 2015   BILIRUBINUR NEGATIVE 12/10/2018 2015   [place] NEGATIVE 12/10/2018 2015   PROTEINUR NEGATIVE 12/10/2018 2015   NITRITE NEGATIVE 12/10/2018 2015   LEUKOCYTESUR NEGATIVE 12/10/2018 2015     STUDIES: CUP PACEART REMOTE DEVICE CHECK  Result Date:  09/04/2019 Carelink summary report received. Battery status OK. Normal device function. No new symptom episodes, tachy episodes, brady, or pause episodes. One new AF episode detected, it lasted 64 minutes, the patient is on [place]. Monthly summary reports and ROV/PRN Kathy Breach, RN,. CCDS, [employer]     ELIGIBLE FOR AVAILABLE RESEARCH PROTOCOL: *no  ASSESSMENT: 74 y.o.  woman status post right breast upper inner quadrant biopsy 03/10/2016 for a clinical T1b N0, stage IA invasive ductal carcinoma, grade 1, estrogen and progesterone receptor positive, HER-2 nonamplified, with an MIB-1 of 10%  (1)Status post right lumpectomy and sentinel lymph node sampling 04/01/2016 for a pT1b pN0, stage IA invasive ductal carcinoma, grade 1, with negative margins  (2) given the patient's excellent anatomic prognosis and age, no Oncotype and no chemotherapy is planned  (3) adjuvant radiation 05/22/2016 to 06/20/2016 1. The Right breast was treated to 42.5 Gy in 17 fractions in 2.5 Gy per fraction. 2. The Right breast was boosted to 7.5 Gy in 3 fractions at 2.5 Gy per fraction.   (4) anastrozole started 07/15/2016  (a) bone density July 2019 normal  (5) Genetics testing 06/23/2016 through [facility]'s Common Hereditary Cancers Panel found no deleterious mutations in APC, ATM, AXIN2, BARD1, BMPR1A, BRCA1, BRCA2, BRIP1, CDH1, CDKN2A, CHEK2, CTNNA1, DICER1, EPCAM, GREM1, HOXB13, KIT, MEN1, MLH1, MSH2, MSH3, MSH6, MUTYH, NBN, NF1, NTHL1, PALB2, PDGFRA, PMS2, POLD1, POLE, PTEN, RAD50, RAD51C, RAD51D, SDHA, SDHB, SDHC, SDHD, SMAD4, SMARCA4, STK11, TP53, TSC1, TSC2, and VHL.  (6) status post left nephrectomy 04/27/2019 for a high-grade papillary urothelial tumor arising from the left renal pelvis, p T2 pN0, with negative margins   PLAN: Cynthia [name] is now about 3 and half years out from definitive surgery for her breast cancer with no evidence of disease recurrence.  This is very favorable.  She is  tolerating anastrozole well and the plan will be to continue that a total of 5 years.  She will be due for repeat bone density when she has her next mammogram at [facility] next year.  I have entered that order  Otherwise she will return to see me in 1 year.  She knows to call for any other issue that may develop before then  Total encounter time 25 minutes.*  Krysta Bloomfield, Cynthia Dad, MD  09/22/19 3:50 PM Medical Oncology and Hematology [facility] [place], [place] [address] Tel. [phone]    Fax. [phone]   I, Wilburn Mylar, am acting as scribe for Dr. Virgie [name]. Kaven Cumbie.  I, Lurline Del MD, have reviewed the above documentation for accuracy and completeness, and I agree with the above.   *Total Encounter Time as defined by the Centers for Medicare and Medicaid Services includes, in addition to the face-to-face time of a patient visit (documented in the note above) non-face-to-face time: obtaining and reviewing outside history, ordering and reviewing medications, tests or procedures, care coordination (communications with other health care professionals or caregivers) and documentation in the medical record.

## 2019-09-30 DIAGNOSIS — M545 Low back pain: Secondary | ICD-10-CM | POA: Diagnosis not present

## 2019-10-04 DIAGNOSIS — M545 Low back pain: Secondary | ICD-10-CM | POA: Diagnosis not present

## 2019-10-10 ENCOUNTER — Ambulatory Visit (INDEPENDENT_AMBULATORY_CARE_PROVIDER_SITE_OTHER): Payer: Medicare PPO

## 2019-10-10 DIAGNOSIS — R55 Syncope and collapse: Secondary | ICD-10-CM

## 2019-10-10 LAB — CUP PACEART REMOTE DEVICE CHECK
Date Time Interrogation Session: 20210930102049
Implantable Pulse Generator Implant Date: 20210521

## 2019-10-12 DIAGNOSIS — M545 Low back pain, unspecified: Secondary | ICD-10-CM | POA: Diagnosis not present

## 2019-10-12 NOTE — Progress Notes (Signed)
[employer] Summary Report / Loop Recorder 

## 2019-10-17 DIAGNOSIS — M545 Low back pain, unspecified: Secondary | ICD-10-CM | POA: Diagnosis not present

## 2019-10-31 DIAGNOSIS — M545 Low back pain, unspecified: Secondary | ICD-10-CM | POA: Diagnosis not present

## 2019-11-03 DIAGNOSIS — M545 Low back pain, unspecified: Secondary | ICD-10-CM | POA: Diagnosis not present

## 2019-11-07 DIAGNOSIS — C652 Malignant neoplasm of left renal pelvis: Secondary | ICD-10-CM | POA: Diagnosis not present

## 2019-11-08 DIAGNOSIS — M545 Low back pain, unspecified: Secondary | ICD-10-CM | POA: Diagnosis not present

## 2019-11-11 LAB — CUP PACEART REMOTE DEVICE CHECK
Date Time Interrogation Session: 20211102102628
Implantable Pulse Generator Implant Date: 20210521

## 2019-11-14 ENCOUNTER — Ambulatory Visit (INDEPENDENT_AMBULATORY_CARE_PROVIDER_SITE_OTHER): Payer: Medicare PPO

## 2019-11-14 DIAGNOSIS — R55 Syncope and collapse: Secondary | ICD-10-CM

## 2019-11-14 DIAGNOSIS — Z905 Acquired absence of kidney: Secondary | ICD-10-CM | POA: Diagnosis not present

## 2019-11-14 DIAGNOSIS — C652 Malignant neoplasm of left renal pelvis: Secondary | ICD-10-CM | POA: Diagnosis not present

## 2019-11-14 DIAGNOSIS — K432 Incisional hernia without obstruction or gangrene: Secondary | ICD-10-CM | POA: Diagnosis not present

## 2019-11-14 DIAGNOSIS — R350 Frequency of micturition: Secondary | ICD-10-CM | POA: Diagnosis not present

## 2019-11-15 NOTE — Progress Notes (Signed)
[employer] Summary Report / Loop Recorder 

## 2019-11-16 DIAGNOSIS — M71572 Other bursitis, not elsewhere classified, left ankle and foot: Secondary | ICD-10-CM | POA: Diagnosis not present

## 2019-11-16 DIAGNOSIS — M2580 Other specified joint disorders, unspecified joint: Secondary | ICD-10-CM | POA: Diagnosis not present

## 2019-11-17 ENCOUNTER — Encounter (HOSPITAL_COMMUNITY): Payer: Self-pay

## 2019-11-17 ENCOUNTER — Other Ambulatory Visit: Payer: Self-pay

## 2019-11-17 ENCOUNTER — Emergency Department (HOSPITAL_COMMUNITY)
Admission: EM | Admit: 2019-11-17 | Discharge: 2019-11-17 | Disposition: A | Discharge status: Home / Self Care | Payer: Medicare PPO | Attending: Emergency Medicine | Admitting: Emergency Medicine

## 2019-11-17 DIAGNOSIS — Z8719 Personal history of other diseases of the digestive system: Secondary | ICD-10-CM | POA: Diagnosis not present

## 2019-11-17 DIAGNOSIS — Z8505 Personal history of malignant neoplasm of liver: Secondary | ICD-10-CM | POA: Insufficient documentation

## 2019-11-17 DIAGNOSIS — R112 Nausea with vomiting, unspecified: Secondary | ICD-10-CM | POA: Diagnosis not present

## 2019-11-17 DIAGNOSIS — Z923 Personal history of irradiation: Secondary | ICD-10-CM | POA: Diagnosis not present

## 2019-11-17 DIAGNOSIS — Z85528 Personal history of other malignant neoplasm of kidney: Secondary | ICD-10-CM | POA: Insufficient documentation

## 2019-11-17 DIAGNOSIS — E039 Hypothyroidism, unspecified: Secondary | ICD-10-CM | POA: Diagnosis not present

## 2019-11-17 DIAGNOSIS — I10 Essential (primary) hypertension: Secondary | ICD-10-CM | POA: Diagnosis not present

## 2019-11-17 DIAGNOSIS — Z7984 Long term (current) use of oral hypoglycemic drugs: Secondary | ICD-10-CM | POA: Insufficient documentation

## 2019-11-17 DIAGNOSIS — R197 Diarrhea, unspecified: Secondary | ICD-10-CM | POA: Insufficient documentation

## 2019-11-17 DIAGNOSIS — N390 Urinary tract infection, site not specified: Secondary | ICD-10-CM | POA: Diagnosis not present

## 2019-11-17 DIAGNOSIS — Z79899 Other long term (current) drug therapy: Secondary | ICD-10-CM | POA: Insufficient documentation

## 2019-11-17 DIAGNOSIS — R0902 Hypoxemia: Secondary | ICD-10-CM | POA: Diagnosis not present

## 2019-11-17 DIAGNOSIS — Z7901 Long term (current) use of anticoagulants: Secondary | ICD-10-CM | POA: Insufficient documentation

## 2019-11-17 DIAGNOSIS — Z853 Personal history of malignant neoplasm of breast: Secondary | ICD-10-CM | POA: Diagnosis not present

## 2019-11-17 DIAGNOSIS — I440 Atrioventricular block, first degree: Secondary | ICD-10-CM | POA: Diagnosis not present

## 2019-11-17 DIAGNOSIS — E860 Dehydration: Secondary | ICD-10-CM | POA: Diagnosis not present

## 2019-11-17 DIAGNOSIS — Z8551 Personal history of malignant neoplasm of bladder: Secondary | ICD-10-CM | POA: Insufficient documentation

## 2019-11-17 DIAGNOSIS — Z1509 Genetic susceptibility to other malignant neoplasm: Secondary | ICD-10-CM | POA: Insufficient documentation

## 2019-11-17 LAB — URINALYSIS, ROUTINE W REFLEX MICROSCOPIC
Bilirubin Urine: NEGATIVE
Glucose, UA: NEGATIVE mg/dL
Ketones, ur: NEGATIVE mg/dL
Nitrite: POSITIVE — AB
Protein, ur: NEGATIVE mg/dL
Specific Gravity, Urine: 1.009 (ref 1.005–1.030)
WBC, UA: >50 WBC/hpf — ABNORMAL HIGH (ref 0–5)
pH: 5 (ref 5.0–8.0)

## 2019-11-17 LAB — CBC
HCT: 41.1 % (ref 36.0–46.0)
Hemoglobin: 13.3 g/dL (ref 12.0–15.0)
MCH: 31.1 pg (ref 26.0–34.0)
MCHC: 32.4 g/dL (ref 30.0–36.0)
MCV: 96 fL (ref 80.0–100.0)
Platelets: 359 10*3/uL (ref 150–400)
RBC: 4.28 MIL/uL (ref 3.87–5.11)
RDW: 13.5 % (ref 11.5–15.5)
WBC: 16.3 10*3/uL — ABNORMAL HIGH (ref 4.0–10.5)
nRBC: 0 % (ref 0.0–0.2)

## 2019-11-17 LAB — COMPREHENSIVE METABOLIC PANEL
ALT: 22 U/L (ref 0–44)
AST: 28 U/L (ref 15–41)
Albumin: 3.9 g/dL (ref 3.5–5.0)
Alkaline Phosphatase: 35 U/L — ABNORMAL LOW (ref 38–126)
Anion gap: 10 (ref 5–15)
BUN: 26 mg/dL — ABNORMAL HIGH (ref 8–23)
CO2: 25 mmol/L (ref 22–32)
Calcium: 9.5 mg/dL (ref 8.9–10.3)
Chloride: 104 mmol/L (ref 98–111)
Creatinine, Ser: 1.15 mg/dL — ABNORMAL HIGH (ref 0.44–1.00)
GFR, Estimated: 49 mL/min — ABNORMAL LOW (ref >60)
Glucose, Bld: 86 mg/dL (ref 70–99)
Potassium: 4.6 mmol/L (ref 3.5–5.1)
Sodium: 139 mmol/L (ref 135–145)
Total Bilirubin: 0.5 mg/dL (ref 0.3–1.2)
Total Protein: 7.6 g/dL (ref 6.5–8.1)

## 2019-11-17 LAB — LIPASE, BLOOD: Lipase: 55 U/L — ABNORMAL HIGH (ref 11–51)

## 2019-11-17 MED ORDER — SODIUM CHLORIDE 0.9 % IV BOLUS
1000.0000 mL | Freq: Once | INTRAVENOUS | Status: AC
  Administered 2019-11-17: 1000 mL via INTRAVENOUS

## 2019-11-17 MED ORDER — CEPHALEXIN 500 MG PO CAPS: 500.0000 mg | ORAL_CAPSULE | Freq: Three times a day (TID) | ORAL | 0 refills | Status: DC

## 2019-11-17 MED ORDER — ONDANSETRON HCL 4 MG PO TABS: 4.0000 mg | ORAL_TABLET | Freq: Three times a day (TID) | ORAL | 0 refills | Status: DC | PRN

## 2019-11-17 NOTE — ED Notes (Signed)
Pt able to tolerate ginger ale and gram crackers with no issues.

## 2019-11-17 NOTE — ED Triage Notes (Addendum)
BIBA, states she had 4 explosive bouts of diarrhea  Also reports nausea and two episodes of vomiting  12 lead normal 2 imodium  133/77 P 72 cbg 89

## 2019-11-17 NOTE — ED Notes (Signed)
Assumed care of patient at this time, nad noted, sr up x2, bed locked and low, call bell w/I reach.  Will continue to monitor. ° °

## 2019-11-17 NOTE — ED Provider Notes (Signed)
[facility] DEPT Provider Note   CSN: [id number] Arrival date & time: 11/17/19  1600     History Chief Complaint  Patient presents with  . Diarrhea    Cynthia [name] is a 74 y.o. female.  The history is provided by the patient and medical records. No language interpreter was used.     74 year old female significant history of breast cancer, atrial fibrillation currently on Eliquis, diabetes, hypertension, presenting complaining of nausea vomiting diarrhea.  Patient reported she went out and ate at [employer] [facility] last night.  She slept fine, woke up this morning without any symptoms however approximately 5 hours ago she developed nausea, vomited 4 separate episodes as well as as having explosive diarrhea for several episodes.  She report mouth feels dry.  She feels dehydrated.  She does not complain of any fever chills no runny nose sneezing or coughing no chest pain or shortness of breath no loss of taste or smell no abdominal pain or dysuria or back pain.  No one else at home that is sick.  She has been fully vaccinated for COVID-19.  She denies any recent medication changes.  She does have history of IBS-C but was well controlled with her medication.  She did take 2 Imodium earlier today.  She is reporting feeling thirsty at this moment.  Past Medical History:  Diagnosis Date  . Anticoagulant long-term use    eliquis--- managed by cardiology  . Anxiety   . Arthritis   . Bradycardia   . Cancer of kidney ([place])   . DDD (degenerative disc disease), lumbar   . Depression   . Dyspnea    ON EXERTION  . First degree heart block   . Genetic testing 06/25/2016   Ms. Breeding underwent genetic counseling and testing for hereditary cancer syndromes on 06/17/2016. Her results were negative for mutations in all 46 genes analyzed by [facility]'s 46-gene Common Hereditary Cancers Panel. Genes analyzed include: APC, ATM, AXIN2, BARD1, BMPR1A, BRCA1, BRCA2, BRIP1, CDH1, CDKN2A, CHEK2,  CTNNA1, DICER1, EPCAM, GREM1, HOXB13, KIT, MEN1, MLH1, MSH2, MSH3, MSH6, MUTYH, NBN,  . GERD (gastroesophageal reflux disease)    occasional,  takes pepcid  . Hematuria   . History of radiation therapy 05/22/2016 to 06/20/2016   right breast cancer  . Hypertension    followd by pcp---  (02-22-2019 per pt never had stress test)  . Hypothyroidism    followed by pcp  . IBS (irritable bowel syndrome)   . Incomplete right bundle branch block   . Insomnia   . Liver cancer ([place])   . Malignant neoplasm of upper-inner quadrant of right breast in female, estrogen receptor positive [facility]) oncologist--- dr [name]   dx 03/ 2018,  Stage IA, IDC, ER/PR +;  04-01-2016 s/p  right breast lumpectomy w/ node dissection's;  completed radiation 06-20-2016  . MVP (mitral valve prolapse)    per echo 12-11-2018 in epic, mild   . NSVT (nonsustained ventricular tachycardia) ([place]) followed by cardiology   12-10-2018  hospital admission ,  refer to discharge note 12-14-2018 for treatement  . PAF (paroxysmal atrial fibrillation) [facility]) primary cardiologist--- dr Margaretann Loveless   newly dx 12-10-2018  admission in epic, Afib w/ RVR and NSVT;   in epic TTE 12-11-2018 showed ef 50-55%, mild MVP,  mild AV sclerosis without stenosis;   Cardiac MRI 12-13-2018 in epic  . Renal mass, left    pelvis   . Restless leg syndrome   . Type 2 diabetes mellitus ([place])  followed by pcp---  (02-22-2019 does not check blood sugar's)    Patient Active Problem List   Diagnosis Date Noted  . Urothelial cancer ([place]) 09/22/2019  . Pre-syncope 07/18/2019  . 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 New onset atrial fibrillation ([place]) 12/10/2018  . HTN (hypertension) 12/10/2018  . Diabetes type 2, controlled ([place]) 12/10/2018  . Hypothyroidism 12/10/2018  . Anxiety 12/10/2018  . Atrial fibrillation with RVR ([place]) 12/10/2018  . Genetic testing 06/25/2016  . Malignant neoplasm of upper-inner quadrant of right breast in female, estrogen receptor positive  ([place]) 03/18/2016    Past Surgical History:  Procedure Laterality Date  . BREAST LUMPECTOMY WITH RADIOACTIVE SEED AND SENTINEL LYMPH NODE BIOPSY Right 04/01/2016   Procedure: RADIOACTIVE SEED GUIDED RIGHT BREAST LUMPECTOMY WITH RIGHT AXILLARY SENTINEL LYMPH NODE BIOPSY.;  Surgeon: Fanny Skates, MD;  Location: [facility];  Service: General;  Laterality: Right;  . CATARACT EXTRACTION W/ INTRAOCULAR LENS  IMPLANT, BILATERAL  1980s  . CYSTOSCOPY/RETROGRADE/URETEROSCOPY N/A 03/02/2019   Procedure: CYSTOSCOPY/LEFT RETROGRADE/URETEROSCOPY/  BIOPSY/  STENT;  Surgeon: Ceasar Mons, MD;  Location: [facility];  Service: Urology;  Laterality: N/A;  . LAPAROSCOPIC CHOLECYSTECTOMY  1990s  . ROBOT ASSITED LAPAROSCOPIC NEPHROURETERECTOMY Left 04/27/2019   Procedure: XI ROBOT ASSITED LAPAROSCOPIC NEPHROURETERECTOMY;  Surgeon: Alexis Frock, MD;  Location: [facility];  Service: Urology;  Laterality: Left;  3.5 HRS  . TONSILLECTOMY  age 50     OB History   No obstetric history on file.     Family History  Problem Relation Age of Onset  . Breast cancer Sister 57       d.54  . Leukemia Brother 56  . Cervical cancer Sister 4  . Ovarian cancer Maternal Aunt 92       d.92s  . Breast cancer Other 72    Social History   Tobacco Use  . Smoking status: Never Smoker  . Smokeless tobacco: Never Used  Vaping Use  . Vaping Use: Never used  Substance Use Topics  . Alcohol use: Yes    Comment: socially  . Drug use: Never    Home Medications Prior to Admission medications   Medication Sig Start Date End Date Taking? Authorizing Provider  ALPRAZolam Duanne Moron) 1 MG tablet Take 1 mg by mouth at bedtime.  12/09/18   [provider]  amiodarone (PACERONE) 200 MG tablet Take 1 tablet (200 mg total) by mouth daily. Take 1 tablet by mouth Monday - Friday 09/22/19   Magrinat, [name], MD  anastrozole (ARIMIDEX) 1 MG tablet Take 1 tablet (1 mg total) by mouth daily. 09/22/19   Magrinat,  [name], MD  Choline Fenofibrate (FENOFIBRIC ACID) 135 MG CPDR Take 135 mg by mouth daily.  01/16/16   [provider]  ELIQUIS 5 MG TABS tablet TAKE 1 TABLET BY MOUTH TWICE A DAY 07/07/19   Deboraha Sprang, MD  famotidine (PEPCID) 10 MG tablet Take 10 mg by mouth 2 (two) times daily.    [provider]  ferrous sulfate 325 (65 FE) MG tablet Take 325 mg by mouth daily with breakfast.     [provider]  imipramine (TOFRANIL) 50 MG tablet Take 50 mg by mouth at bedtime. 01/16/16   [provider]  levothyroxine (SYNTHROID, LEVOTHROID) 112 MCG tablet Take 112 mcg by mouth at bedtime.  03/13/16   [provider]  metFORMIN (GLUCOPHAGE) 500 MG tablet Take 500 mg by mouth 2 (two) times daily.  01/16/16   [provider]  psyllium (REGULOID) 0.52 g capsule Take 2.6-3.12 g by mouth 2 (two) times daily.     [provider]  traMADol (ULTRAM) 50 MG tablet Take 1 tablet by mouth daily as needed for pain. 07/04/19   [provider]  traZODone (DESYREL) 150 MG tablet Take 0.5 tablets (75 mg total) by mouth at bedtime. 09/22/19   Magrinat, [name], MD  triamterene-hydrochlorothiazide (MAXZIDE-25) 37.5-25 MG tablet Take 1 tablet by mouth daily.    [provider]    Allergies    Patient has no known allergies.  Review of Systems   Review of Systems  All other systems reviewed and are negative.   Physical Exam Updated Vital Signs BP 115/69 (BP Location: Left Arm)   Pulse 77   Temp 98.2 F (36.8 C) (Oral)   Resp 16   SpO2 100%   Physical Exam Vitals and nursing note reviewed.  Constitutional:      General: She is not in acute distress.    Appearance: She is well-developed.  HENT:     Head: Atraumatic.     Mouth/Throat:     Mouth: Mucous membranes are moist.  Eyes:     Conjunctiva/sclera: Conjunctivae normal.  Cardiovascular:     Rate and Rhythm: Normal rate and regular rhythm.     Pulses: Normal pulses.     Heart  sounds: Normal heart sounds.  Pulmonary:     Effort: Pulmonary effort is normal.     Breath sounds: Normal breath sounds.  Abdominal:     General: Abdomen is flat.     Palpations: Abdomen is soft.     Tenderness: There is no abdominal tenderness.  Musculoskeletal:     Cervical back: Neck supple.  Skin:    Findings: No rash.  Neurological:     Mental Status: She is alert and oriented to person, place, and time.  Psychiatric:        Mood and Affect: Mood normal.     ED Results / Procedures / Treatments   Labs (all labs ordered are listed, but only abnormal results are displayed) Labs Reviewed  LIPASE, BLOOD - Abnormal; Notable for the following components:      Result Value   Lipase 55 (*)    All other components within normal limits  COMPREHENSIVE METABOLIC PANEL - Abnormal; Notable for the following components:   BUN 26 (*)    Creatinine, Ser 1.15 (*)    Alkaline Phosphatase 35 (*)    GFR, Estimated 49 (*)    All other components within normal limits  CBC - Abnormal; Notable for the following components:   WBC 16.3 (*)    All other components within normal limits  URINALYSIS, ROUTINE W REFLEX MICROSCOPIC - Abnormal; Notable for the following components:   APPearance HAZY (*)    Hgb urine dipstick SMALL (*)    Nitrite POSITIVE (*)    Leukocytes,Ua LARGE (*)    WBC, UA >50 (*)    Bacteria, UA MANY (*)    All other components within normal limits    EKG None  Radiology No results found.  Procedures Procedures (including critical care time)  Medications Ordered in ED Medications  sodium chloride 0.9 % bolus 1,000 mL (0 mLs Intravenous Stopped 11/17/19 1819)    ED Course  I have reviewed the triage vital signs and the nursing notes.  Pertinent labs & imaging results that were available during my care of the patient were reviewed by me and considered  in my medical decision making (see chart for details).    MDM Rules/Calculators/A&P                            BP 131/79 (BP Location: Left Arm)   Pulse 68   Temp 98.1 F (36.7 C) (Oral)   Resp 12   SpO2 100%   Final Clinical Impression(s) / ED Diagnoses Final diagnoses:  Nausea vomiting and diarrhea  Acute lower UTI    Rx / DC Orders ED Discharge Orders         Ordered    cephALEXin (KEFLEX) 500 MG capsule  3 times daily        11/17/19 2043    ondansetron (ZOFRAN) 4 MG tablet  Every 8 hours PRN        11/17/19 2043         5:01 PM Patient with acute onset of nausea vomiting diarrhea that started earlier today.  No abdominal pain no fever and no other complaint.  Symptoms seems to abate but patient does report feeling dehydrated.  Vital signs stable, she is afebrile.  I suspect this is likely to be a viral GI cause.  I have low suspicion for acute abdominal pathology such as diverticulitis.  Will check basic labs, give IV fluid and will perform p.o. trial.  8:39 PM  Labs remarkable for mild AKI with BUN 26, creatinine 1.15 IV fluid given.  Mild elevated lipase of 55, WBC 16.3 likely stress reaction.  UA shows positive nitrite large leukocyte esterase many bacteria and greater than 50 WBC consistent with UTI.  Will treat with Keflex.  Patient at this time request to be discharged.  On reexamination abdomen is entirely nontender.  She tolerates p.o. and felt much better.     Domenic Moras, PA-C 11/17/19 2043    Blanchie Dessert, MD 11/30/19 [phone]

## 2019-11-17 NOTE — Discharge Instructions (Signed)
Your nausea vomiting diarrhea is likely due to a viral stomach bug.  Your urine shows signs of urinary tract infection.  Please take Keflex as prescribed for treatment of UTI.  Take Zofran as needed for nausea.  Follow-up closely with your doctor for further care.  Return if you have any concern.

## 2019-11-25 DIAGNOSIS — M71572 Other bursitis, not elsewhere classified, left ankle and foot: Secondary | ICD-10-CM | POA: Diagnosis not present

## 2019-12-05 DIAGNOSIS — R300 Dysuria: Secondary | ICD-10-CM | POA: Diagnosis not present

## 2019-12-05 DIAGNOSIS — Z23 Encounter for immunization: Secondary | ICD-10-CM | POA: Diagnosis not present

## 2019-12-05 DIAGNOSIS — R7303 Prediabetes: Secondary | ICD-10-CM | POA: Diagnosis not present

## 2019-12-05 DIAGNOSIS — I10 Essential (primary) hypertension: Secondary | ICD-10-CM | POA: Diagnosis not present

## 2019-12-05 DIAGNOSIS — E785 Hyperlipidemia, unspecified: Secondary | ICD-10-CM | POA: Diagnosis not present

## 2019-12-05 DIAGNOSIS — E039 Hypothyroidism, unspecified: Secondary | ICD-10-CM | POA: Diagnosis not present

## 2019-12-05 DIAGNOSIS — E559 Vitamin D deficiency, unspecified: Secondary | ICD-10-CM | POA: Diagnosis not present

## 2019-12-05 DIAGNOSIS — F330 Major depressive disorder, recurrent, mild: Secondary | ICD-10-CM | POA: Diagnosis not present

## 2019-12-18 LAB — CUP PACEART REMOTE DEVICE CHECK
Date Time Interrogation Session: 20211205093129
Implantable Pulse Generator Implant Date: 20210521

## 2019-12-19 ENCOUNTER — Ambulatory Visit (INDEPENDENT_AMBULATORY_CARE_PROVIDER_SITE_OTHER): Payer: Medicare PPO

## 2019-12-19 DIAGNOSIS — R55 Syncope and collapse: Secondary | ICD-10-CM | POA: Diagnosis not present

## 2019-12-27 DIAGNOSIS — Z03818 Encounter for observation for suspected exposure to other biological agents ruled out: Secondary | ICD-10-CM | POA: Diagnosis not present

## 2020-01-03 NOTE — Progress Notes (Signed)
[employer] Summary Report / Loop Recorder 

## 2020-01-13 ENCOUNTER — Ambulatory Visit (INDEPENDENT_AMBULATORY_CARE_PROVIDER_SITE_OTHER): Payer: Medicare PPO

## 2020-01-13 DIAGNOSIS — R55 Syncope and collapse: Secondary | ICD-10-CM

## 2020-01-14 LAB — CUP PACEART REMOTE DEVICE CHECK
Date Time Interrogation Session: 20220107093218
Implantable Pulse Generator Implant Date: 20210521

## 2020-01-17 ENCOUNTER — Ambulatory Visit: Payer: Self-pay | Admitting: Surgery

## 2020-01-17 ENCOUNTER — Telehealth: Payer: Self-pay | Admitting: *Deleted

## 2020-01-17 DIAGNOSIS — K432 Incisional hernia without obstruction or gangrene: Secondary | ICD-10-CM | POA: Diagnosis not present

## 2020-01-17 NOTE — Telephone Encounter (Signed)
   Primary Cardiologist: Elouise Munroe, MD / Dr. Caryl Comes  Chart reviewed as part of pre-operative protocol coverage. Patient was contacted 01/17/2020 in reference to pre-operative risk assessment for pending surgery as outlined below.  The surgery is for incisional hernia repair.  She says she developed incisional hernia after kidney surgery earlier last year. Cynthia [name] was last seen on 07/19/2019 by Dr. Caryl Comes.  Since that day, Cynthia [name] has done well without chest pain or worsening dyspnea on exertion.  She did mention she has been feeling poorly for the past 2 days, she is pending a COVID test.  From the cardiac perspective she is stable to proceed with incisional hernia repair.  Therefore, based on ACC/AHA guidelines, the patient would be at acceptable risk for the planned procedure without further cardiovascular testing.   She will need to hold her Eliquis for 2 days prior to the procedure and restart as soon as possible afterward.  The patient was advised that if she develops new symptoms prior to surgery to contact our office to arrange for a follow-up visit, and she verbalized understanding.  I will route this recommendation to the requesting party via Epic fax function and remove from pre-op pool. Please call with questions.  Almyra Deforest, [place] 01/17/2020, 5:31 PM

## 2020-01-17 NOTE — Telephone Encounter (Signed)
Patient with diagnosis of afib on Eliquis for anticoagulation.    Procedure: HERNIA REPAIR  Date of procedure: TBD    CHA2DS2-VASc Score = 4  This indicates a 4.8% annual risk of stroke. The patient's score is based upon: CHF History: No HTN History: No Diabetes History: Yes Stroke History: No Vascular Disease History: No Age Score: 2 Gender Score: 1     CrCl 37 ml/min  Per office protocol, patient can hold Eliquis for 2 days prior to procedure.

## 2020-01-17 NOTE — Telephone Encounter (Signed)
   [facility] Pre-operative Risk Assessment    HEARTCARE STAFF: - Please ensure there is not already an duplicate clearance open for this procedure. - Under Visit Info/Reason for Call, type in Other and utilize the format Clearance MM/DD/YY or Clearance TBD. Do not use dashes or single digits. - If request is for dental extraction, please clarify the # of teeth to be extracted.  Request for surgical clearance:  1. What type of surgery is being performed? HERNIA REPAIR   2. When is this surgery scheduled? TBD   3. What type of clearance is required (medical clearance vs. Pharmacy clearance to hold med vs. Both)? BOTH  4. Are there any medications that need to be held prior to surgery and how long? ELIQUIS x 2 DAYS PRIOR   5. Practice name and name of physician performing surgery? [facility]; DR. Brantley Stage   6. What is the office phone number? [phone]   7.   What is the office fax number? [place]: [place], CMA   8.   Anesthesia type (None, local, MAC, general) ? GENERAL   Julaine Hua 01/17/2020, 11:51 AM  _________________________________________________________________   (provider comments below)

## 2020-01-17 NOTE — Telephone Encounter (Signed)
Clinical pharmacist to review Eliquis 

## 2020-01-17 NOTE — H&P (Signed)
Cynthia [name] Appointment: 01/17/2020 9:40 AM Location: [facility] Patient #: [id number] DOB: November 30, 1945 Married / Language: Cynthia [name] / Race: White Female  History of Present Illness (Cynthia [name] A. Tavon Magnussen MD; 01/17/2020 11:03 AM) Patient words: Patient sent at the request of Dr. Tresa Moore of urology for incisional hernia. She underwent a nephrectomy in April 2021. At the incision site she noticed a bulge especially over the last 3 months. The bulge has gotten larger. There is no nausea or vomiting. There is no pain. She was felt to have a hernia and presents today for evaluation.  The patient is a 75 year old female.   Past Surgical History Cynthia Coke, RN; 01/17/2020 9:56 AM) Gallbladder Surgery - Laparoscopic  Allergies (Cynthia Herrin, RN; 01/17/2020 9:57 AM) No Known Drug Allergies [04/24/2016]: Allergies Reconciled  Medication History (Cynthia Herrin, RN; 01/17/2020 9:59 AM) Anastrozole (1MG  Tablet, Oral) Active. ALPRAZolam (1MG  Tablet, Oral) Active. Benefiber (Oral) Active. Triamterene-HCTZ (37.5-25MG  Tablet, Oral) Active. TraZODone HCl (150MG  Tablet, Oral) Active. Levothyroxine Sodium (112MCG Tablet, Oral) Active. Imipramine HCl (50MG  Tablet, Oral) Active. Fenofibric Acid (135MG  Capsule DR, Oral) Active. Atenolol (25MG  Tablet, Oral) Active. Illusions C Breast Prosthesis (1 (one)) Active. (L8000 Post-surgical bras - to hold breast prosthesis ; QTY: 6; Length of use:3 Months 3 Refills) Eliquis (5MG  Tablet, Oral) Active. traMADol HCl (50MG  Tablet, Oral) Active. Amiodarone HCl (200MG  Tablet, Oral) Active. metFORMIN HCl (500MG  Tablet, Oral) Active. Medications Reconciled  Social History Cynthia Coke, RN; 01/17/2020 9:56 AM) Tobacco use Never smoker.  Family History Cynthia Coke, RN; 01/17/2020 9:56 AM) Arthritis Mother. Colon Cancer Sister. Depression Mother. Heart Disease Mother.  Pregnancy / Birth History Cynthia Coke, RN; 01/17/2020 9:56  AM) Age of menopause 65-50  Other Problems Cynthia Coke, RN; 01/17/2020 9:56 AM) Anxiety Disorder Back Pain Breast Cancer Cancer Gastroesophageal Reflux Disease Thyroid Disease     Review of Systems (Cynthia Herrin RN; 01/17/2020 9:56 AM) Skin Present- Dryness. Not Present- Change in Wart/Mole, Hives, Jaundice, New Lesions, Non-Healing Wounds, Rash and Ulcer. HEENT Present- Wears glasses/contact lenses. Not Present- Earache, Hearing Loss, Hoarseness, Nose Bleed, Oral Ulcers, Ringing in the Ears, Seasonal Allergies, Sinus Pain, Sore Throat, Visual Disturbances and Yellow Eyes. Respiratory Present- Chronic Cough. Not Present- Bloody sputum, Difficulty Breathing, Snoring and Wheezing. Breast Not Present- Breast Mass, Breast Pain, Nipple Discharge and Skin Changes. Cardiovascular Not Present- Chest Pain, Difficulty Breathing Lying Down, Leg Cramps, Palpitations, Rapid Heart Rate, Shortness of Breath and Swelling of Extremities. Gastrointestinal Present- Constipation. Not Present- Abdominal Pain, Bloating, Bloody Stool, Change in Bowel Habits, Chronic diarrhea, Difficulty Swallowing, Excessive gas, Gets full quickly at meals, Hemorrhoids, Indigestion, Nausea, Rectal Pain and Vomiting. Endocrine Not Present- Cold Intolerance, Excessive Hunger, Hair Changes, Heat Intolerance, Hot flashes and New Diabetes. Hematology Present- Blood Thinners and Easy Bruising. Not Present- Excessive bleeding, Gland problems, HIV and Persistent Infections.  Vitals (Cynthia Herrin RN; 01/17/2020 9:59 AM) 01/17/2020 9:59 AM Weight: 156.25 lb Height: 65.5in Body Surface Area: 1.79 m Body Mass Index: 25.61 kg/m  Temp.: 97.70F  Pulse: 92 (Regular)  P.OX: 96% (Room air) BP: 142/74(Sitting, Left Arm, Standard)        Physical Exam (Cynthia [name] A. Monetta Lick MD; 01/17/2020 11:04 AM)  General Mental Status-Alert. General Appearance-Consistent with stated age. Hydration-Well  hydrated. Voice-Normal.  Head and Neck Head-normocephalic, atraumatic with no lesions or palpable masses. Trachea-midline.  Cardiovascular Cardiovascular examination reveals -normal heart sounds, regular rate and rhythm with no murmurs and normal pedal pulses bilaterally.  Abdomen Note: Midline incision noted. There is a reducible  hernia. It is at the incision site. It is nontender.  Neurologic Neurologic evaluation reveals -alert and oriented x 3 with no impairment of recent or remote memory. Mental Status-Normal.  Musculoskeletal Normal Exam - Left-Upper Extremity Strength Normal and Lower Extremity Strength Normal. Normal Exam - Right-Upper Extremity Strength Normal, Lower Extremity Weakness.    Assessment & Plan (Cynthia [name] A. Sidrah Harden MD; 01/17/2020 11:07 AM)  INCISIONAL HERNIA, WITHOUT OBSTRUCTION OR GANGRENE (K43.2) Impression: Reducible nonobstructing incisional hernia. Discussed laparoscopic and open approaches as well as use of mesh and the pros and cons of this. Recommend repeat CT scanning for surgical planning to determine if she is better open or laparoscopic candidate. We discussed both techniques and I suspect she may require possible transversus abdominis released to facilitate closure. This can be accomplished laparoscopically or open. Mesh use and potential issues discussed. Complications discussed. Once imaging is complete and further plan her next step. She also needs cardiac clearance.  The risk of hernia repair include bleeding, infection, organ injury, bowel injury, bladder injury, nerve injury recurrent hernia, blood clots, worsening of underlying condition, chronic pain, mesh use, open surgery, death, and the need for other operations. Pt agrees to proceed   total time 45 minutes  Current Plans Pt Education - [facility] Free Text Education/Instructions: discussed with patient and provided information. Pt Education - [facility] Mesh education: discussed with  patient and provided information. Pt Education - [facility] General Post-op HCI Pt Education - [facility] Mesh education: discussed with patient and provided information. [facility]CS Consent - Hernia Repair - Ventral/Incisional/Umbilical (Gross): discussed with patient and provided information. You are being scheduled for surgery- Our schedulers will call you.  You should hear from our office's scheduling department within 5 working days about the location, date, and time of surgery. We try to make accommodations for patient's preferences in scheduling surgery, but sometimes the OR schedule or the surgeon's schedule prevents [place] from making those accommodations.  If you have not heard from our office [phone]) in 5 working days, call the office and ask for your surgeon's nurse.  If you have other questions about your diagnosis, plan, or surgery, call the office and ask for your surgeon's nurse.  Pt Education - Pamphlet Given - Hernia Surgery: discussed with patient and provided information. CCS Consent - Hernia Repair - Ventral/Incisional/Umbilical : discussed with patient and provided information. Pt Education - [facility] Mesh education: discussed with patient and provided informa

## 2020-01-19 ENCOUNTER — Other Ambulatory Visit: Payer: Self-pay | Admitting: Surgery

## 2020-01-19 DIAGNOSIS — K432 Incisional hernia without obstruction or gangrene: Secondary | ICD-10-CM

## 2020-01-27 NOTE — Progress Notes (Signed)
[employer] Summary Report / Loop Recorder 

## 2020-02-06 ENCOUNTER — Other Ambulatory Visit: Payer: Medicare PPO

## 2020-02-15 ENCOUNTER — Ambulatory Visit (INDEPENDENT_AMBULATORY_CARE_PROVIDER_SITE_OTHER): Payer: Medicare PPO

## 2020-02-15 DIAGNOSIS — M79672 Pain in left foot: Secondary | ICD-10-CM | POA: Diagnosis not present

## 2020-02-15 DIAGNOSIS — L84 Corns and callosities: Secondary | ICD-10-CM | POA: Diagnosis not present

## 2020-02-15 DIAGNOSIS — R55 Syncope and collapse: Secondary | ICD-10-CM | POA: Diagnosis not present

## 2020-02-16 LAB — CUP PACEART REMOTE DEVICE CHECK
Date Time Interrogation Session: 20220209093635
Implantable Pulse Generator Implant Date: 20210521

## 2020-02-20 ENCOUNTER — Other Ambulatory Visit: Payer: Self-pay

## 2020-02-20 ENCOUNTER — Ambulatory Visit
Admission: RE | Admit: 2020-02-20 | Discharge: 2020-02-20 | Disposition: A | Discharge status: Home / Self Care | Payer: Medicare PPO | Source: Ambulatory Visit | Attending: Surgery | Admitting: Surgery

## 2020-02-20 DIAGNOSIS — D259 Leiomyoma of uterus, unspecified: Secondary | ICD-10-CM | POA: Diagnosis not present

## 2020-02-20 DIAGNOSIS — K432 Incisional hernia without obstruction or gangrene: Secondary | ICD-10-CM

## 2020-02-20 DIAGNOSIS — K5730 Diverticulosis of large intestine without perforation or abscess without bleeding: Secondary | ICD-10-CM | POA: Diagnosis not present

## 2020-02-20 NOTE — Progress Notes (Signed)
[employer] Summary Report / Loop Recorder 

## 2020-02-21 ENCOUNTER — Ambulatory Visit: Payer: Self-pay | Admitting: Surgery

## 2020-03-19 ENCOUNTER — Ambulatory Visit (INDEPENDENT_AMBULATORY_CARE_PROVIDER_SITE_OTHER): Payer: Medicare PPO

## 2020-03-19 DIAGNOSIS — R55 Syncope and collapse: Secondary | ICD-10-CM

## 2020-03-21 LAB — CUP PACEART REMOTE DEVICE CHECK
Date Time Interrogation Session: 20220314104011
Implantable Pulse Generator Implant Date: 20210521

## 2020-03-26 ENCOUNTER — Other Ambulatory Visit (HOSPITAL_COMMUNITY)
Admission: RE | Admit: 2020-03-26 | Discharge: 2020-03-26 | Disposition: A | Discharge status: Home / Self Care | Payer: Medicare PPO | Source: Ambulatory Visit | Attending: Surgery | Admitting: Surgery

## 2020-03-26 DIAGNOSIS — Z01812 Encounter for preprocedural laboratory examination: Secondary | ICD-10-CM | POA: Insufficient documentation

## 2020-03-26 DIAGNOSIS — Z20822 Contact with and (suspected) exposure to covid-19: Secondary | ICD-10-CM | POA: Insufficient documentation

## 2020-03-27 LAB — SARS CORONAVIRUS 2 (TAT 6-24 HRS): SARS Coronavirus 2: NEGATIVE

## 2020-03-27 NOTE — Progress Notes (Signed)
[employer] Summary Report / Loop Recorder 

## 2020-03-28 ENCOUNTER — Encounter (HOSPITAL_COMMUNITY): Payer: Self-pay | Admitting: Surgery

## 2020-03-28 NOTE — Anesthesia Preprocedure Evaluation (Addendum)
Anesthesia Evaluation  Patient identified by MRN, date of birth, ID band Patient awake    Reviewed: Allergy & Precautions, NPO status , Patient's Chart, lab work & pertinent test results  Airway Mallampati: III  TM Distance: >3 FB Neck ROM: Full    Dental no notable dental hx. (+) Teeth Intact, Dental Advisory Given   Pulmonary shortness of breath and with exertion,    Pulmonary exam normal breath sounds clear to auscultation       Cardiovascular hypertension, Pt. on medications Normal cardiovascular exam+ dysrhythmias (amio, eliquis) Atrial Fibrillation + Valvular Problems/Murmurs (mild MR) MR  Rhythm:Regular Rate:Normal  Has Medtronic Reveal LINQ Loop Recorder  MRI Cardiac 12/13/2018 IMPRESSION: 1. 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 Inferior RV insertion site late gadolinium enhancement, which is a nonspecific finding often seen in setting of elevated pulmonary pressures but has been described in LV noncompaction 3.  Normal LV size with mildly reduced systolic function (EF 61%) 4.  Normal RV size and systolic function (EF 44%) 5. Small to moderate pericardial effusion, measuring up to 30mm adjacent to inferior LV wall   Echo 12/11/2018  IMPRESSIONS  1. Left ventricular ejection fraction, by visual estimation, is 50 to  55%. The left ventricle has normal function. There is no left ventricular  hypertrophy.  2. Global right ventricle has normal systolic function.The right  ventricular size is normal. No increase in right ventricular wall  thickness.  3. Left atrial size was normal.  4. Right atrial size was normal.  5. Mild mitral valve prolapse.  6. The mitral valve is normal in structure. Mild mitral valve  regurgitation. No evidence of mitral stenosis.  7. The tricuspid valve is normal in structure. Tricuspid valve  regurgitation is trivial.  8. The  aortic valve is normal in structure. Aortic valve regurgitation is  trivial. Mild aortic valve sclerosis without stenosis.  9. The pulmonic valve was normal in structure. Pulmonic valve  regurgitation is trivial.  10. TR signal is inadequate for assessing pulmonary artery systolic  pressure.  11. The inferior vena cava is normal in size with greater than 50%  respiratory variability, suggesting right atrial pressure of 3 mmHg.   Echo 12/2018: 1. Left ventricular ejection fraction, by visual estimation, is 50 to  55%. The left ventricle has normal function. There is no left ventricular  hypertrophy.  2. Global right ventricle has normal systolic function.The right  ventricular size is normal. No increase in right ventricular wall  thickness.  3. Left atrial size was normal.  4. Right atrial size was normal.  5. Mild mitral valve prolapse.  6. The mitral valve is normal in structure. Mild mitral valve  regurgitation. No evidence of mitral stenosis.  7. The tricuspid valve is normal in structure. Tricuspid valve  regurgitation is trivial.  8. The aortic valve is normal in structure. Aortic valve regurgitation is  trivial. Mild aortic valve sclerosis without stenosis.  9. The pulmonic valve was normal in structure. Pulmonic valve  regurgitation is trivial.  10. TR signal is inadequate for assessing pulmonary artery systolic  pressure.  11. The inferior vena cava is normal in size with greater than 50%  respiratory variability, suggesting right atrial pressure of 3 mmHg.   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; s/p Medtronic Reveal LINQ loop recorder 05/27/19),    Neuro/Psych PSYCHIATRIC DISORDERS Anxiety Depression  Neuromuscular disease (RLS)    GI/Hepatic Neg liver ROS, GERD  Controlled and Medicated,IBS   Endo/Other  diabetes, Well Controlled, Type 2, Oral Hypoglycemic AgentsHypothyroidism a1c 5.5  Renal/GU Renal  disease (hx renal ca s/p nephroureterectomy 04/2019)  negative genitourinary   Musculoskeletal  (+) Arthritis , Osteoarthritis,    Abdominal   Peds  Hematology negative hematology ROS (+)   Anesthesia Other Findings Incisional hernia   Hx R breast ca 2018 w/ LN bx  Reproductive/Obstetrics negative OB ROS                           Anesthesia Physical Anesthesia Plan  ASA: III  Anesthesia Plan: General   Post-op Pain Management:    Induction: Intravenous  PONV Risk Score and Plan: 4 or greater and Ondansetron, Dexamethasone and Treatment may vary due to age or medical condition  Airway Management Planned: Oral ETT  Additional Equipment: None  Intra-op Plan:   Post-operative Plan: Extubation in OR  Informed Consent: I have reviewed the patients History and Physical, chart, labs and discussed the procedure including the risks, benefits and alternatives for the proposed anesthesia with the patient or authorized representative who has indicated his/her understanding and acceptance.     Dental advisory given  Plan Discussed with: CRNA  Anesthesia Plan Comments: (Magnet available for ICD)       Anesthesia Quick Evaluation

## 2020-03-28 NOTE — Progress Notes (Signed)
Anesthesia Chart Review: Cynthia [name]   Case: [id number] Date/Time: 03/29/20 0715   Procedure: REPAIR OF INCISIONAL HERNIA WITH MESH (N/A )   Anesthesia type: General   Pre-op diagnosis: INCISIONAL HERNIA   Location: [facility] ROOM 01 / [facility]   Surgeons: Erroll Luna, MD      DISCUSSION: Patient is a 75 year old female scheduled for the above procedure.  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  Preoperative cardiology input outlined on 01/17/2020 by Almyra Deforest, PA, ".Marland KitchenMarland KitchenFrom the cardiac perspective she is stable to proceed with incisional hernia repair.  Therefore, based on ACC/AHA guidelines, the patient would be at acceptable risk for the planned procedure without further cardiovascular testing.   She will need to hold her Eliquis for 2 days prior to the procedure and restart as soon as possible afterward..."  Loop recorder interrogation 02/16/20-03/19/20: ILR summary report received. Battery status OK. Normal device function. No new symptom, tachy, brady, or pause episodes. AF burden 0.1%. History of AF and on amiodarone and apixaban. Monthly summary reports and ROV/PRN.  03/26/2020 presurgical COVID-19 test negative. She is a same-day work-up, so labs and anesthesia team evaluation on the day of surgery.   VS:  BP Readings from Last 3 Encounters:  11/17/19 123/70  09/22/19 125/77  07/19/19 130/70   Pulse Readings from Last 3  Encounters:  11/17/19 74  09/22/19 74  07/19/19 77    PROVIDERS: [name], MD is PCP  Cherlynn Kaiser, MD is cardiologist Virl Axe, MD is EP cardiologist  Magrinat, Sarajane Jews, MD is Baker Pierini, MD is RAD-[facility] Ellison Hughs, MD Alexis Frock, MD are urologists   LABS: For day of surgery. H/H 13.3/41.1 and Cr 1.15 11/17/19.   IMAGES: CT Abd/pelvis 02/20/20: IMPRESSION: - Status post left nephrectomy and cholecystectomy. - Anterior abdominal wall hernia with a loop of transverse colon within. No incarceration is noted. - Diverticulosis without diverticulitis.   EKG: 07/19/19: NSR, first degree AV block (PR 212 ms). Non-specific ST/T wave changes.    CV: MRI Cardiac 12/13/2018 IMPRESSION: 1. 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 Inferior RV insertion site late gadolinium enhancement, which is a nonspecific finding often seen in setting of elevated pulmonary pressures but has been described in LV noncompaction 3.  Normal LV size with mildly reduced systolic function (EF 81%) 4.  Normal RV size and systolic function (EF 01%) 5. Small to moderate pericardial effusion, measuring up to 71mm adjacent to inferior LV wall   Echo 12/11/2018  IMPRESSIONS  1. Left ventricular ejection fraction, by visual estimation, is 50 to  55%. The left ventricle has normal function. There is no left ventricular  hypertrophy.  2. Global right ventricle has normal systolic function.The right  ventricular size is normal. No increase in right ventricular wall  thickness.  3. Left atrial size was normal.  4. Right atrial size was normal.  5. Mild mitral valve prolapse.  6. The mitral valve is normal in structure. Mild mitral valve  regurgitation. No evidence of mitral stenosis.  7. The tricuspid valve is  normal in structure. Tricuspid valve  regurgitation is trivial.  8. The aortic valve is normal in  structure. Aortic valve regurgitation is  trivial. Mild aortic valve sclerosis without stenosis.  9. The pulmonic valve was normal in structure. Pulmonic valve  regurgitation is trivial.  10. TR signal is inadequate for assessing pulmonary artery systolic  pressure.  11. The inferior vena cava is normal in size with greater than 50%  respiratory variability, suggesting right atrial pressure of 3 mmHg.   Past Medical History:  Diagnosis Date  . AICD (automatic cardioverter/defibrillator) present    Medtronic  . Anticoagulant long-term use    eliquis--- managed by cardiology  . Anxiety   . Arthritis   . Bradycardia   . Cancer of kidney ([place])   . DDD (degenerative disc disease), lumbar   . Depression   . Dyspnea    ON EXERTION  . First degree heart block   . Genetic testing 06/25/2016   Ms. Torok underwent genetic counseling and testing for hereditary cancer syndromes on 06/17/2016. Her results were negative for mutations in all 46 genes analyzed by [facility]'s 46-gene Common Hereditary Cancers Panel. Genes analyzed include: APC, ATM, AXIN2, BARD1, BMPR1A, BRCA1, BRCA2, BRIP1, CDH1, CDKN2A, CHEK2, CTNNA1, DICER1, EPCAM, GREM1, HOXB13, KIT, MEN1, MLH1, MSH2, MSH3, MSH6, MUTYH, NBN,  . GERD (gastroesophageal reflux disease)    occasional,  takes pepcid  . Hematuria   . History of radiation therapy 05/22/2016 to 06/20/2016   right breast cancer  . Hypertension    followd by pcp---  (02-22-2019 per pt never had stress test)  . Hypothyroidism    followed by pcp  . IBS (irritable bowel syndrome)   . Incomplete right bundle branch block   . Insomnia   . Malignant neoplasm of upper-inner quadrant of right breast in female, estrogen receptor positive [facility]) oncologist--- dr [name]   dx 03/ 2018,  Stage IA, IDC, ER/PR +;  04-01-2016 s/p  right breast lumpectomy w/ node dissection's;  completed radiation 06-20-2016  . MVP (mitral valve prolapse)    per echo 12-11-2018 in epic, mild   .  NSVT (nonsustained ventricular tachycardia) ([place]) followed by cardiology   12-10-2018  hospital admission ,  refer to discharge note 12-14-2018 for treatement  . PAF (paroxysmal atrial fibrillation) [facility]) primary cardiologist--- dr Margaretann Loveless   newly dx 12-10-2018  admission in epic, Afib w/ RVR and NSVT;   in epic TTE 12-11-2018 showed ef 50-55%, mild MVP,  mild AV sclerosis without stenosis;   Cardiac MRI 12-13-2018 in epic  . Renal mass, left    pelvis   . Restless leg syndrome    occasional  . Type 2 diabetes mellitus ([place])    followed by pcp---  (02-22-2019 does not check blood sugar's)    Past Surgical History:  Procedure Laterality Date  . BREAST LUMPECTOMY WITH RADIOACTIVE SEED AND SENTINEL LYMPH NODE BIOPSY Right 04/01/2016   Procedure: RADIOACTIVE SEED GUIDED RIGHT BREAST LUMPECTOMY WITH RIGHT AXILLARY SENTINEL LYMPH NODE BIOPSY.;  Surgeon: Fanny Skates, MD;  Location: [facility];  Service: General;  Laterality: Right;  . CATARACT EXTRACTION W/ INTRAOCULAR LENS  IMPLANT, BILATERAL  1980s  . CYSTOSCOPY/RETROGRADE/URETEROSCOPY N/A 03/02/2019   Procedure: CYSTOSCOPY/LEFT RETROGRADE/URETEROSCOPY/  BIOPSY/  STENT;  Surgeon: Ceasar Mons, MD;  Location: [facility];  Service: Urology;  Laterality: N/A;  . LAPAROSCOPIC CHOLECYSTECTOMY  1990s  . ROBOT ASSITED LAPAROSCOPIC NEPHROURETERECTOMY Left 04/27/2019   Procedure: XI ROBOT ASSITED LAPAROSCOPIC NEPHROURETERECTOMY;  Surgeon: Alexis Frock, MD;  Location: [facility];  Service: Urology;  Laterality: Left;  3.5 HRS  . TONSILLECTOMY  age 8    MEDICATIONS: No current facility-administered medications for this encounter.   Marland Kitchen ALPRAZolam (XANAX) 1 MG tablet  . amiodarone (PACERONE) 200 MG tablet  . anastrozole (ARIMIDEX) 1 MG tablet  . Cholecalciferol (VITAMIN D) 50 MCG (2000 UT) CAPS  . Choline Fenofibrate (FENOFIBRIC ACID) 135 MG CPDR  . ELIQUIS 5 MG TABS tablet  . ferrous sulfate 325 (65 FE) MG tablet  . imipramine  (TOFRANIL) 50 MG tablet  . levothyroxine (SYNTHROID, LEVOTHROID) 112 MCG tablet  . metFORMIN (GLUCOPHAGE) 500 MG tablet  . psyllium (REGULOID) 0.52 g capsule  . traZODone (DESYREL) 150 MG tablet  . triamterene-hydrochlorothiazide (MAXZIDE-25) 37.5-25 MG tablet  . cephALEXin (KEFLEX) 500 MG capsule  . ondansetron (ZOFRAN) 4 MG tablet    Myra Gianotti, PA-C Surgical Short Stay/Anesthesiology [facility] Phone [phone] [facility] Phone [phone] 03/28/2020 2:08 PM

## 2020-03-28 NOTE — Progress Notes (Signed)
Patient denies shortness of breath, fever, cough or chest pain.  PCP - Dr [name] Cardiologist - Dr Virl Axe Urologist - Dr Tresa Moore  Chest x-ray - n/a EKG - 07/19/19 Stress Test - n/a ECHO - n/a Cardiac Cath - 12/11/18  ICD Pacemaker/Loop - Medtronic.  Last remote check was on 03/19/20.  Email sent to [employer] and [employer] Reps.  Perioperative Prescription for ICD Programming was faxed.  [place].  Fasting Blood Sugar - Unknown Checks Blood Sugar does not check blood sugar  DM type 2   Blood Thinner Instructions:  Last dose of  Eliquis was on 03/26/20.  ERAS: Clear liquids until 4:30 am day of surgery.  Anesthesia review: Yes  STOP now taking any Aspirin (unless otherwise instructed by your surgeon), Aleve, Naproxen, Ibuprofen, Motrin, Advil, Goody's, BC's, all herbal medications, fish oil, and all vitamins.   Coronavirus Screening Covid test on 03/26/20 was negative.  Patient verbalized understanding of instructions that were given via phone.

## 2020-03-29 ENCOUNTER — Ambulatory Visit (HOSPITAL_COMMUNITY): Payer: Medicare PPO | Admitting: Vascular Surgery

## 2020-03-29 ENCOUNTER — Other Ambulatory Visit: Payer: Self-pay

## 2020-03-29 ENCOUNTER — Encounter (HOSPITAL_COMMUNITY)
Admission: RE | Disposition: A | Discharge status: Home / Self Care | Payer: Self-pay | Source: Home / Self Care | Attending: Surgery

## 2020-03-29 ENCOUNTER — Encounter (HOSPITAL_COMMUNITY): Payer: Self-pay | Admitting: Surgery

## 2020-03-29 ENCOUNTER — Ambulatory Visit (HOSPITAL_COMMUNITY)
Admission: RE | Admit: 2020-03-29 | Discharge: 2020-03-29 | Disposition: A | Discharge status: Home / Self Care | Payer: Medicare PPO | Attending: Surgery | Admitting: Surgery

## 2020-03-29 DIAGNOSIS — Z905 Acquired absence of kidney: Secondary | ICD-10-CM | POA: Diagnosis not present

## 2020-03-29 DIAGNOSIS — Z8249 Family history of ischemic heart disease and other diseases of the circulatory system: Secondary | ICD-10-CM | POA: Insufficient documentation

## 2020-03-29 DIAGNOSIS — I480 Paroxysmal atrial fibrillation: Secondary | ICD-10-CM | POA: Diagnosis not present

## 2020-03-29 DIAGNOSIS — Z818 Family history of other mental and behavioral disorders: Secondary | ICD-10-CM | POA: Insufficient documentation

## 2020-03-29 DIAGNOSIS — Z8261 Family history of arthritis: Secondary | ICD-10-CM | POA: Diagnosis not present

## 2020-03-29 DIAGNOSIS — K432 Incisional hernia without obstruction or gangrene: Secondary | ICD-10-CM | POA: Diagnosis not present

## 2020-03-29 DIAGNOSIS — I10 Essential (primary) hypertension: Secondary | ICD-10-CM | POA: Diagnosis not present

## 2020-03-29 DIAGNOSIS — Z923 Personal history of irradiation: Secondary | ICD-10-CM | POA: Insufficient documentation

## 2020-03-29 DIAGNOSIS — Z7901 Long term (current) use of anticoagulants: Secondary | ICD-10-CM | POA: Diagnosis not present

## 2020-03-29 DIAGNOSIS — E119 Type 2 diabetes mellitus without complications: Secondary | ICD-10-CM | POA: Insufficient documentation

## 2020-03-29 DIAGNOSIS — Z95810 Presence of automatic (implantable) cardiac defibrillator: Secondary | ICD-10-CM | POA: Insufficient documentation

## 2020-03-29 DIAGNOSIS — Z79899 Other long term (current) drug therapy: Secondary | ICD-10-CM | POA: Diagnosis not present

## 2020-03-29 DIAGNOSIS — Z800 Family history of malignant neoplasm of digestive organs: Secondary | ICD-10-CM | POA: Insufficient documentation

## 2020-03-29 DIAGNOSIS — Z7984 Long term (current) use of oral hypoglycemic drugs: Secondary | ICD-10-CM | POA: Diagnosis not present

## 2020-03-29 DIAGNOSIS — Z853 Personal history of malignant neoplasm of breast: Secondary | ICD-10-CM | POA: Insufficient documentation

## 2020-03-29 DIAGNOSIS — Z79811 Long term (current) use of aromatase inhibitors: Secondary | ICD-10-CM | POA: Insufficient documentation

## 2020-03-29 DIAGNOSIS — I440 Atrioventricular block, first degree: Secondary | ICD-10-CM | POA: Diagnosis not present

## 2020-03-29 DIAGNOSIS — Z8553 Personal history of malignant neoplasm of renal pelvis: Secondary | ICD-10-CM | POA: Insufficient documentation

## 2020-03-29 HISTORY — PX: INCISIONAL HERNIA REPAIR: SHX193

## 2020-03-29 HISTORY — DX: Presence of automatic (implantable) cardiac defibrillator: Z95.810

## 2020-03-29 LAB — COMPREHENSIVE METABOLIC PANEL
ALT: 18 U/L (ref 0–44)
AST: 23 U/L (ref 15–41)
Albumin: 3.5 g/dL (ref 3.5–5.0)
Alkaline Phosphatase: 39 U/L (ref 38–126)
Anion gap: 7 (ref 5–15)
BUN: 17 mg/dL (ref 8–23)
CO2: 29 mmol/L (ref 22–32)
Calcium: 9.3 mg/dL (ref 8.9–10.3)
Chloride: 95 mmol/L — ABNORMAL LOW (ref 98–111)
Creatinine, Ser: 1 mg/dL (ref 0.44–1.00)
GFR, Estimated: 58 mL/min — ABNORMAL LOW (ref >60)
Glucose, Bld: 100 mg/dL — ABNORMAL HIGH (ref 70–99)
Potassium: 3.7 mmol/L (ref 3.5–5.1)
Sodium: 131 mmol/L — ABNORMAL LOW (ref 135–145)
Total Bilirubin: 0.4 mg/dL (ref 0.3–1.2)
Total Protein: 6.8 g/dL (ref 6.5–8.1)

## 2020-03-29 LAB — CBC WITH DIFFERENTIAL/PLATELET
Abs Immature Granulocytes: 0.03 10*3/uL (ref 0.00–0.07)
Basophils Absolute: 0.1 10*3/uL (ref 0.0–0.1)
Basophils Relative: 1 %
Eosinophils Absolute: 0.2 10*3/uL (ref 0.0–0.5)
Eosinophils Relative: 3 %
HCT: 35.7 % — ABNORMAL LOW (ref 36.0–46.0)
Hemoglobin: 11.7 g/dL — ABNORMAL LOW (ref 12.0–15.0)
Immature Granulocytes: 1 %
Lymphocytes Relative: 30 %
Lymphs Abs: 1.9 10*3/uL (ref 0.7–4.0)
MCH: 30.2 pg (ref 26.0–34.0)
MCHC: 32.8 g/dL (ref 30.0–36.0)
MCV: 92 fL (ref 80.0–100.0)
Monocytes Absolute: 0.6 10*3/uL (ref 0.1–1.0)
Monocytes Relative: 10 %
Neutro Abs: 3.6 10*3/uL (ref 1.7–7.7)
Neutrophils Relative %: 55 %
Platelets: 379 10*3/uL (ref 150–400)
RBC: 3.88 MIL/uL (ref 3.87–5.11)
RDW: 13.4 % (ref 11.5–15.5)
WBC: 6.4 10*3/uL (ref 4.0–10.5)
nRBC: 0 % (ref 0.0–0.2)

## 2020-03-29 LAB — GLUCOSE, CAPILLARY: Glucose-Capillary: 130 mg/dL — ABNORMAL HIGH (ref 70–99)

## 2020-03-29 SURGERY — REPAIR, HERNIA, INCISIONAL
Anesthesia: General | Site: Abdomen

## 2020-03-29 MED ORDER — HEMOSTATIC AGENTS (NO CHARGE) OPTIME
TOPICAL | Status: DC | PRN
  Administered 2020-03-29: 1 via TOPICAL

## 2020-03-29 MED ORDER — HYDROMORPHONE HCL 1 MG/ML IJ SOLN: 0.2500 mg | INTRAMUSCULAR | Status: DC | PRN

## 2020-03-29 MED ORDER — OXYCODONE HCL 5 MG PO TABS
ORAL_TABLET | ORAL | Status: AC
  Filled 2020-03-29: qty 1

## 2020-03-29 MED ORDER — 0.9 % SODIUM CHLORIDE (POUR BTL) OPTIME
TOPICAL | Status: DC | PRN
  Administered 2020-03-29: 1000 mL

## 2020-03-29 MED ORDER — ONDANSETRON HCL 4 MG/2ML IJ SOLN
INTRAMUSCULAR | Status: DC | PRN
  Administered 2020-03-29: 4 mg via INTRAVENOUS

## 2020-03-29 MED ORDER — OXYCODONE HCL 5 MG PO TABS: 5.0000 mg | ORAL_TABLET | Freq: Four times a day (QID) | ORAL | 0 refills | Status: DC | PRN

## 2020-03-29 MED ORDER — OXYCODONE HCL 5 MG PO TABS
5.0000 mg | ORAL_TABLET | Freq: Once | ORAL | Status: AC | PRN
  Administered 2020-03-29: 5 mg via ORAL

## 2020-03-29 MED ORDER — ACETAMINOPHEN 500 MG PO TABS: 1000.0000 mg | ORAL_TABLET | ORAL | Status: AC

## 2020-03-29 MED ORDER — PHENYLEPHRINE 40 MCG/ML (10ML) SYRINGE FOR IV PUSH (FOR BLOOD PRESSURE SUPPORT)
PREFILLED_SYRINGE | INTRAVENOUS | Status: AC
  Filled 2020-03-29: qty 10

## 2020-03-29 MED ORDER — LIDOCAINE HCL (CARDIAC) PF 100 MG/5ML IV SOSY
PREFILLED_SYRINGE | INTRAVENOUS | Status: DC | PRN
  Administered 2020-03-29: 60 mg via INTRAVENOUS

## 2020-03-29 MED ORDER — BUPIVACAINE LIPOSOME 1.3 % IJ SUSP
INTRAMUSCULAR | Status: DC | PRN
  Administered 2020-03-29: 10 mL

## 2020-03-29 MED ORDER — PROPOFOL 10 MG/ML IV BOLUS
INTRAVENOUS | Status: AC
  Filled 2020-03-29: qty 40

## 2020-03-29 MED ORDER — BUPIVACAINE HCL (PF) 0.25 % IJ SOLN
INTRAMUSCULAR | Status: AC
  Filled 2020-03-29: qty 30

## 2020-03-29 MED ORDER — GABAPENTIN 300 MG PO CAPS
ORAL_CAPSULE | ORAL | Status: AC
  Administered 2020-03-29: 300 mg via ORAL
  Filled 2020-03-29: qty 1

## 2020-03-29 MED ORDER — FENTANYL CITRATE (PF) 250 MCG/5ML IJ SOLN
INTRAMUSCULAR | Status: AC
  Filled 2020-03-29: qty 5

## 2020-03-29 MED ORDER — ACETAMINOPHEN 500 MG PO TABS
ORAL_TABLET | ORAL | Status: AC
  Administered 2020-03-29: 1000 mg via ORAL
  Filled 2020-03-29: qty 2

## 2020-03-29 MED ORDER — PHENYLEPHRINE HCL (PRESSORS) 10 MG/ML IV SOLN
INTRAVENOUS | Status: DC | PRN
  Administered 2020-03-29 (×7): 80 ug via INTRAVENOUS

## 2020-03-29 MED ORDER — OXYCODONE HCL 5 MG/5ML PO SOLN: 5.0000 mg | Freq: Once | ORAL | Status: AC | PRN

## 2020-03-29 MED ORDER — VANCOMYCIN HCL 500 MG IV SOLR
INTRAVENOUS | Status: AC
  Filled 2020-03-29: qty 500

## 2020-03-29 MED ORDER — BUPIVACAINE-EPINEPHRINE 0.25% -1:200000 IJ SOLN
INTRAMUSCULAR | Status: DC | PRN
  Administered 2020-03-29: 15 mL

## 2020-03-29 MED ORDER — DEXAMETHASONE SODIUM PHOSPHATE 10 MG/ML IJ SOLN
INTRAMUSCULAR | Status: AC
  Filled 2020-03-29: qty 1

## 2020-03-29 MED ORDER — CHLORHEXIDINE GLUCONATE CLOTH 2 % EX PADS: 6.0000 | MEDICATED_PAD | Freq: Once | CUTANEOUS | Status: DC

## 2020-03-29 MED ORDER — EPHEDRINE 5 MG/ML INJ
INTRAVENOUS | Status: AC
  Filled 2020-03-29: qty 10

## 2020-03-29 MED ORDER — FENTANYL CITRATE (PF) 100 MCG/2ML IJ SOLN
INTRAMUSCULAR | Status: DC | PRN
  Administered 2020-03-29 (×2): 50 ug via INTRAVENOUS

## 2020-03-29 MED ORDER — LACTATED RINGERS IV SOLN: INTRAVENOUS | Status: DC

## 2020-03-29 MED ORDER — CHLORHEXIDINE GLUCONATE 0.12 % MT SOLN
OROMUCOSAL | Status: AC
  Administered 2020-03-29: 15 mL via OROMUCOSAL
  Filled 2020-03-29: qty 15

## 2020-03-29 MED ORDER — PROPOFOL 10 MG/ML IV BOLUS
INTRAVENOUS | Status: DC | PRN
  Administered 2020-03-29: 150 mg via INTRAVENOUS

## 2020-03-29 MED ORDER — LIDOCAINE 2% (20 MG/ML) 5 ML SYRINGE: INTRAMUSCULAR | Status: DC | PRN

## 2020-03-29 MED ORDER — LIDOCAINE 2% (20 MG/ML) 5 ML SYRINGE
INTRAMUSCULAR | Status: AC
  Filled 2020-03-29: qty 5

## 2020-03-29 MED ORDER — ONDANSETRON HCL 4 MG/2ML IJ SOLN: 4.0000 mg | Freq: Once | INTRAMUSCULAR | Status: DC | PRN

## 2020-03-29 MED ORDER — EPHEDRINE SULFATE 50 MG/ML IJ SOLN
INTRAMUSCULAR | Status: DC | PRN
  Administered 2020-03-29: 20 mg via INTRAVENOUS
  Administered 2020-03-29 (×3): 5 mg via INTRAVENOUS
  Administered 2020-03-29: 10 mg via INTRAVENOUS
  Administered 2020-03-29 (×3): 5 mg via INTRAVENOUS
  Administered 2020-03-29: 10 mg via INTRAVENOUS

## 2020-03-29 MED ORDER — CEFAZOLIN SODIUM-DEXTROSE 2-4 GM/100ML-% IV SOLN
INTRAVENOUS | Status: AC
  Filled 2020-03-29: qty 100

## 2020-03-29 MED ORDER — BUPIVACAINE LIPOSOME 1.3 % IJ SUSP
INTRAMUSCULAR | Status: AC
  Filled 2020-03-29: qty 20

## 2020-03-29 MED ORDER — CHLORHEXIDINE GLUCONATE 0.12 % MT SOLN: 15.0000 mL | Freq: Once | OROMUCOSAL | Status: AC

## 2020-03-29 MED ORDER — ROCURONIUM BROMIDE 10 MG/ML (PF) SYRINGE
PREFILLED_SYRINGE | INTRAVENOUS | Status: DC | PRN
  Administered 2020-03-29: 60 mg via INTRAVENOUS
  Administered 2020-03-29: 10 mg via INTRAVENOUS

## 2020-03-29 MED ORDER — ORAL CARE MOUTH RINSE: 15.0000 mL | Freq: Once | OROMUCOSAL | Status: AC

## 2020-03-29 MED ORDER — ONDANSETRON HCL 4 MG/2ML IJ SOLN
INTRAMUSCULAR | Status: AC
  Filled 2020-03-29: qty 2

## 2020-03-29 MED ORDER — GABAPENTIN 300 MG PO CAPS: 300.0000 mg | ORAL_CAPSULE | ORAL | Status: AC

## 2020-03-29 MED ORDER — CEFAZOLIN SODIUM-DEXTROSE 2-4 GM/100ML-% IV SOLN
2.0000 g | INTRAVENOUS | Status: AC
  Administered 2020-03-29: 2 g via INTRAVENOUS

## 2020-03-29 MED ORDER — SUGAMMADEX SODIUM 200 MG/2ML IV SOLN
INTRAVENOUS | Status: DC | PRN
  Administered 2020-03-29: 200 mg via INTRAVENOUS

## 2020-03-29 MED ORDER — SODIUM CHLORIDE 0.9 % IV SOLN
INTRAVENOUS | Status: AC
  Administered 2020-03-29: 500 mL
  Filled 2020-03-29: qty 10

## 2020-03-29 MED ORDER — DEXAMETHASONE SODIUM PHOSPHATE 10 MG/ML IJ SOLN
INTRAMUSCULAR | Status: DC | PRN
  Administered 2020-03-29: 10 mg via INTRAVENOUS

## 2020-03-29 MED ORDER — VANCOMYCIN HCL 500 MG IV SOLR
INTRAVENOUS | Status: DC | PRN
  Administered 2020-03-29: 500 mg

## 2020-03-29 SURGICAL SUPPLY — 51 items
BINDER ABDOMINAL 12 ML 46-62 (SOFTGOODS) IMPLANT
BIOPATCH RED 1 DISK 7.0 (GAUZE/BANDAGES/DRESSINGS) ×2 IMPLANT
CANISTER SUCT 3000ML PPV (MISCELLANEOUS) ×2 IMPLANT
CHLORAPREP W/TINT 26 (MISCELLANEOUS) ×2 IMPLANT
CNTNR URN SCR LID CUP LEK RST (MISCELLANEOUS) ×1 IMPLANT
CONT SPEC 4OZ STRL OR WHT (MISCELLANEOUS) ×1
COVER SURGICAL LIGHT HANDLE (MISCELLANEOUS) ×2 IMPLANT
COVER WAND RF STERILE (DRAPES) ×2 IMPLANT
DERMABOND ADVANCED (GAUZE/BANDAGES/DRESSINGS) ×1
DERMABOND ADVANCED .7 DNX12 (GAUZE/BANDAGES/DRESSINGS) ×1 IMPLANT
DRAIN CHANNEL 19F RND (DRAIN) ×2 IMPLANT
DRAPE LAPAROSCOPIC ABDOMINAL (DRAPES) ×2 IMPLANT
DRSG PAD ABDOMINAL 8X10 ST (GAUZE/BANDAGES/DRESSINGS) IMPLANT
DRSG TEGADERM 4X4.75 (GAUZE/BANDAGES/DRESSINGS) ×2 IMPLANT
ELECT CAUTERY BLADE 6.4 (BLADE) ×2 IMPLANT
ELECT REM PT RETURN 9FT ADLT (ELECTROSURGICAL) ×2
ELECTRODE REM PT RTRN 9FT ADLT (ELECTROSURGICAL) ×1 IMPLANT
EVACUATOR SILICONE 100CC (DRAIN) ×2 IMPLANT
GAUZE SPONGE 4X4 12PLY STRL (GAUZE/BANDAGES/DRESSINGS) IMPLANT
GLOVE BIO SURGEON STRL SZ8 (GLOVE) ×2 IMPLANT
GLOVE SRG 8 PF TXTR STRL LF DI (GLOVE) ×1 IMPLANT
GLOVE SURG UNDER POLY LF SZ8 (GLOVE) ×1
GOWN STRL REUS W/ TWL LRG LVL3 (GOWN DISPOSABLE) ×1 IMPLANT
GOWN STRL REUS W/ TWL XL LVL3 (GOWN DISPOSABLE) ×1 IMPLANT
GOWN STRL REUS W/TWL LRG LVL3 (GOWN DISPOSABLE) ×1
GOWN STRL REUS W/TWL XL LVL3 (GOWN DISPOSABLE) ×1
HEMOSTAT ARISTA ABSORB 3G PWDR (HEMOSTASIS) ×2 IMPLANT
KIT BASIN OR (CUSTOM PROCEDURE TRAY) ×2 IMPLANT
KIT TURNOVER KIT B (KITS) ×2 IMPLANT
MESH ULTRAPRO 6X6 15CM15CM (Mesh General) ×2 IMPLANT
NEEDLE HYPO 25GX1X1/2 BEV (NEEDLE) ×2 IMPLANT
NS IRRIG 1000ML POUR BTL (IV SOLUTION) ×2 IMPLANT
PACK GENERAL/GYN (CUSTOM PROCEDURE TRAY) ×2 IMPLANT
PAD ARMBOARD 7.5X6 YLW CONV (MISCELLANEOUS) ×2 IMPLANT
PENCIL SMOKE EVACUATOR (MISCELLANEOUS) ×2 IMPLANT
STAPLER VISISTAT 35W (STAPLE) IMPLANT
SUT ETHILON 2 0 FS 18 (SUTURE) ×2 IMPLANT
SUT MON AB 4-0 PC3 18 (SUTURE) ×2 IMPLANT
SUT NOVA NAB DX-16 0-1 5-0 T12 (SUTURE) ×8 IMPLANT
SUT PDS AB 1 CTX 36 (SUTURE) ×4 IMPLANT
SUT VIC AB 0 CT1 27 (SUTURE) ×3
SUT VIC AB 0 CT1 27XBRD ANBCTR (SUTURE) ×3 IMPLANT
SUT VIC AB 2-0 SH 18 (SUTURE) ×2 IMPLANT
SUT VIC AB 2-0 SH 27 (SUTURE) ×1
SUT VIC AB 2-0 SH 27XBRD (SUTURE) ×1 IMPLANT
SUT VIC AB 3-0 SH 27 (SUTURE) ×1
SUT VIC AB 3-0 SH 27X BRD (SUTURE) ×1 IMPLANT
SYR CONTROL 10ML LL (SYRINGE) IMPLANT
TOWEL GREEN STERILE (TOWEL DISPOSABLE) ×2 IMPLANT
TOWEL GREEN STERILE FF (TOWEL DISPOSABLE) ×2 IMPLANT
TRAY FOLEY MTR SLVR 16FR STAT (SET/KITS/TRAYS/PACK) IMPLANT

## 2020-03-29 NOTE — Anesthesia Procedure Notes (Addendum)
Procedure Name: Intubation Date/Time: 03/29/2020 7:42 AM Performed by: Kathryne Hitch, CRNA Pre-anesthesia Checklist: Patient identified, Emergency Drugs available, Suction available and Patient being monitored Patient Re-evaluated:Patient Re-evaluated prior to induction Oxygen Delivery Method: Circle system utilized Preoxygenation: Pre-oxygenation with 100% oxygen Induction Type: IV induction Ventilation: Mask ventilation without difficulty Laryngoscope Size: Glidescope Grade View: Grade I Tube type: Oral Tube size: 7.0 mm Number of attempts: 2 (DL x1 by SRNA) Airway Equipment and Method: Stylet and Oral airway Placement Confirmation: ETT inserted through vocal cords under direct vision,  positive ETCO2 and breath sounds checked- equal and bilateral Secured at: 21 cm Tube secured with: Tape Dental Injury: Teeth and Oropharynx as per pre-operative assessment

## 2020-03-29 NOTE — H&P (Signed)
Cynthia [name]  Location: [facility] Patient #: [id number] DOB: 10/09/1945 Married / Language: English / Race: White Female  History of Present IllnessPatient words: Patient sent at the request of Dr. Tresa Moore of urology for incisional hernia. She underwent a nephrectomy in April 2021. At the incision site she noticed a bulge especially over the last 3 months. The bulge has gotten larger. There is no nausea or vomiting. There is no pain. She was felt to have a hernia and presents today for evaluation.  The patient is a 75 year old female.   Past Surgical History (Gallbladder Surgery - Laparoscopic  Allergies No Known Drug Allergies [04/24/2016]: Allergies Reconciled  Medication History M) Anastrozole (1MG  Tablet, Oral) Active. ALPRAZolam (1MG  Tablet, Oral) Active. Benefiber (Oral) Active. Triamterene-HCTZ (37.5-25MG  Tablet, Oral) Active. TraZODone HCl (150MG  Tablet, Oral) Active. Levothyroxine Sodium (112MCG Tablet, Oral) Active. Imipramine HCl (50MG  Tablet, Oral) Active. Fenofibric Acid (135MG  Capsule DR, Oral) Active. Atenolol (25MG  Tablet, Oral) Active. Illusions C Breast Prosthesis (1 (one)) Active. (L8000 Post-surgical bras - to hold breast prosthesis ; QTY: 6; Length of use:3 Months 3 Refills) Eliquis (5MG  Tablet, Oral) Active. traMADol HCl (50MG  Tablet, Oral) Active. Amiodarone HCl (200MG  Tablet, Oral) Active. metFORMIN HCl (500MG  Tablet, Oral) Active. Medications Reconciled  Social History Tobacco use Never smoker.  Family History  Arthritis Mother. Colon Cancer Sister. Depression Mother. Heart Disease Mother.  Pregnancy / Birth History  Age of menopause [age]  Other Problems Anxiety Disorder Back Pain Breast Cancer Cancer Gastroesophageal Reflux Disease Thyroid Disease     Review of Systems (Diane Herrin RN; 01/17/2020 9:56 AM) Skin Present- Dryness. Not Present- Change in Wart/Mole, Hives,  Jaundice, New Lesions, Non-Healing Wounds, Rash and Ulcer. HEENT Present- Wears glasses/contact lenses. Not Present- Earache, Hearing Loss, Hoarseness, Nose Bleed, Oral Ulcers, Ringing in the Ears, Seasonal Allergies, Sinus Pain, Sore Throat, Visual Disturbances and Yellow Eyes. Respiratory Present- Chronic Cough. Not Present- Bloody sputum, Difficulty Breathing, Snoring and Wheezing. Breast Not Present- Breast Mass, Breast Pain, Nipple Discharge and Skin Changes. Cardiovascular Not Present- Chest Pain, Difficulty Breathing Lying Down, Leg Cramps, Palpitations, Rapid Heart Rate, Shortness of Breath and Swelling of Extremities. Gastrointestinal Present- Constipation. Not Present- Abdominal Pain, Bloating, Bloody Stool, Change in Bowel Habits, Chronic diarrhea, Difficulty Swallowing, Excessive gas, Gets full quickly at meals, Hemorrhoids, Indigestion, Nausea, Rectal Pain and Vomiting. Endocrine Not Present- Cold Intolerance, Excessive Hunger, Hair Changes, Heat Intolerance, Hot flashes and New Diabetes. Hematology Present- Blood Thinners and Easy Bruising. Not Present- Excessive bleeding, Gland problems, HIV and Persistent Infections.  Vitals Weight: 156.25 lb Height: 65.5in Body Surface Area: 1.79 m Body Mass Index: 25.61 kg/m  Temp.: 97.68F  Pulse: 92 (Regular)  P.OX: 96% (Room air) BP: 142/74(Sitting, Left Arm, Standard)        Physical Exam (  General Mental Status-Alert. General Appearance-Consistent with stated age. Hydration-Well hydrated. Voice-Normal.  Head and Neck Head-normocephalic, atraumatic with no lesions or palpable masses. Trachea-midline.  Cardiovascular Cardiovascular examination reveals -normal heart sounds, regular rate and rhythm with no murmurs and normal pedal pulses bilaterally.  Abdomen Note: Midline incision noted. There is a reducible hernia. It is at the incision site. It is nontender.  Neurologic Neurologic  evaluation reveals -alert and oriented x 3 with no impairment of recent or remote memory. Mental Status-Normal.  Musculoskeletal Normal Exam - Left-Upper Extremity Strength Normal and Lower Extremity Strength Normal. Normal Exam - Right-Upper Extremity Strength Normal, Lower Extremity Weakness.    Assessment & Plan (Thom INCISIONAL HERNIA, WITHOUT OBSTRUCTION  OR GANGRENE (K43.2) Impression: Reducible nonobstructing incisional hernia. Discussed laparoscopic and open approaches as well as use of mesh and the pros and cons of this. Recommend repeat CT scanning for surgical planning to determine if she is better open or laparoscopic candidate. We discussed both techniques and I suspect she may require possible transversus abdominis released to facilitate closure. This can be accomplished laparoscopically or open. Mesh use and potential issues discussed. Complications discussed. Once imaging is complete and further plan her next step. She also needs cardiac clearance.  The risk of hernia repair include bleeding, infection, organ injury, bowel injury, bladder injury, nerve injury recurrent hernia, blood clots, worsening of underlying condition, chronic pain, mesh use, open surgery, death, and the need for other operations. Pt agrees to proceed   total time 45 minutes  Current Plans Pt Education - CCS Free Text Education/Instructions: discussed with patient and provided information. Pt Education - CCS Mesh education: discussed with patient and provided information. Pt Education - CCS General Post-op HCI Pt Education - CCS Mesh education: discussed with patient and provided information. CCS Consent - Hernia Repair - Ventral/Incisional/Umbilical (Gross): discussed with patient and provided information. You are being scheduled for surgery- Our schedulers will call you.  You should hear from our office's scheduling department within 5 working days about the location, date, and  time of surgery. We try to make accommodations for patient's preferences in scheduling surgery, but sometimes the OR schedule or the surgeon's schedule prevents [place] from making those accommodations.  If you have not heard from our office [phone]) in 5 working days, call the office and ask for your surgeon's nurse.  If you have other questions about your diagnosis, plan, or surgery, call the office and ask for your surgeon's nurse.  Pt Education - Pamphlet Given - Hernia Surgery: discussed with patient and provided information. CCS Consent - Hernia Repair - Ventral/Incisional/Umbilical : discussed with patient and provided information. Pt Education - CCS Mesh education: discussed with patient and provided informa

## 2020-03-29 NOTE — Op Note (Signed)
Preoperative diagnosis: Incisional hernia without obstruction or gangrene  Postoperative diagnosis: Same  Procedure: Open repair of incisional hernia with ultra Pro mesh in a submuscular position  Surgeon: Erroll Luna, MD  Anesthesia: General with Exparel diluted by 20 cc for local regional block as well as bupivacaine 0.25% plain  EBL: Minimal  Specimen: None  Drains: 19 round drain  IV fluids: Per anesthesia record  Indications for procedure: Patient 75 year old female with a previous laparoscopic nephrectomy.  She is developed incisional hernia in her upper midline incision.  Examination revealed this to feel to be about 3 cm in maximal diameter.  CT scan showed this to be slightly larger.  We discussed laparoscopic and open techniques.  After discussion of both the pros and cons of each as well as the use of mesh and the pros and cons of this, she opted for open repair of her incisional hernia.  Risks and benefits were discussed.  Long-term expectations, recovery and complications were discussed as well.The risk of hernia repair include bleeding,  Infection,   Recurrence of the hernia,  Mesh use, chronic pain,  Organ injury,  Bowel injury,  Bladder injury,   nerve injury with numbness around the incision,  Death,  and worsening of preexisting  medical problems.  The alternatives to surgery have been discussed as well..  Long term expectations of both operative and non operative treatments have been discussed.   The patient agrees to proceed.    Description of procedure: The patient was met in the holding area and questions were answered.  She was then taken back to the operating place upon upon the OR table.  After induction of general anesthesia, the abdomen was prepped and draped in sterile fashion.  She received appropriate preoperative antibiotics.  Timeout was performed.  Local anesthetic was infiltrated into the previous midline scar in the upper abdomen.  This was 0.25% Marcaine  plain.  Incision was made.  Dissection was carried down into the subcutaneous fatty tissues.  Large hernia sac was identified and the hernia was actually much larger than anticipated with CT scanning or exam.  It actually was most of the incision which was about 8 cm.  Once I mobilized the sac identified the fascia.  I cleared off the fascia with cautery.  I then used Coker's to grab the fascia to pick it up.  I then incised the fascia and identified the rectus abdominis muscles bilaterally.  The plane posterior that was developed bluntly.  This helped mobilize the posterior sheath to give me a more ability to close.  The entire posterior sheath was mobilized on both sides provide 8 cm to 10 cm in a craniocaudal direction and lateral until the transversus abdominis fascia was identified at the lateral border of the rectus muscle.  This was done bilaterally.  This plane was developed with blunt dissection carefully with good hemostasis.  Once this plane was developed I then closed the posterior sheath with #0 Vicryl taking small bites and care not to injure the internal viscera.  This was done in a running fashion.  Once this fascia was closed, 15 cm x 15 cm a piece of ultra Pro mesh was brought on the field.  I cut this down to be closer to 12 x 10.  This was placed in a submuscular position anterior to the posterior sheath and posterior to the muscle itself.  Slits were cut on the superior caudal portion of this to fit the space nicely.  We  then used interrupted 0 Novafil's to tack the mesh laterally taking care not to injure internal viscera.  This laid nicely.  Inspection was done for hemostasis.  Antibiotic irrigation was used and space was irrigated and suctioned out and was hemostatic.  I then closed the anterior sheath over this with interrupted 0 Novafil sutures.  I mobilized the anterior sheath off the subcutaneous fat with cautery circumferentially for 3 more centimeters which brought it together with  minimal tension.  Vancomycin powder 40 mg was applied to the wound.  Arista was also used to control some oozing from the subcutaneous fat.  Through separate stab incision 19 round drain was placed.  The tip of the drain was placed in between sutures so that it communicated with the deep space where the mesh was located for appropriate decompression.  This was secured to the skin with 2-0 nylon.  A regional block was placed in the rectus muscle using Exparel circumferentially which was diluted by 20 cc.  Once this was done we inspected for hemostasis it was excellent.  We closed the subcutaneous fatty tissue with 2-0 Vicryl.  4 Monocryl was used to close the skin in a subcuticular fashion.  The drain is placed to bulb suction.  Good seal was noted.  All counts are correct.  Dermabond applied.  Breast binder placed.  The patient was then awoke extubated taken to recovery in satisfactory condition.

## 2020-03-29 NOTE — Anesthesia Postprocedure Evaluation (Signed)
Anesthesia Post Note  Patient: Cynthia [name]  Procedure(s) Performed: REPAIR OF INCISIONAL HERNIA WITH MESH (N/A Abdomen)     Patient location during evaluation: PACU Anesthesia Type: General Level of consciousness: awake and alert, oriented and patient cooperative Pain management: pain level controlled Vital Signs Assessment: post-procedure vital signs reviewed and stable Respiratory status: spontaneous breathing, nonlabored ventilation and respiratory function stable Cardiovascular status: blood pressure returned to baseline and stable Postop Assessment: no apparent nausea or vomiting Anesthetic complications: no   No complications documented.  Last Vitals:  Vitals:   03/29/20 0627 03/29/20 0945  BP: (!) 170/72 107/83  Pulse: 66 61  Resp: 17 18  Temp: 37.1 C 36.7 C  SpO2: 100% 100%    Last Pain:  Vitals:   03/29/20 0945  TempSrc:   PainSc: [place]

## 2020-03-29 NOTE — Transfer of Care (Addendum)
Immediate Anesthesia Transfer of Care Note  Patient: Cynthia [name]  Procedure(s) Performed: REPAIR OF INCISIONAL HERNIA WITH MESH (N/A Abdomen)  Patient Location: PACU  Anesthesia Type:General  Level of Consciousness: awake, alert  and oriented  Airway & Oxygen Therapy: Patient Spontanous Breathing and Patient connected to face mask  Post-op Assessment: Report given to RN and Post -op Vital signs reviewed and stable  Post vital signs: Reviewed and stable  Last Vitals:  Vitals Value Taken Time  BP 107/83 03/29/20 0942  Temp    Pulse 61 03/29/20 0945  Resp 14 03/29/20 0945  SpO2 100 % 03/29/20 0945  Vitals shown include unvalidated device data.  Last Pain:  Vitals:   03/29/20 0647  TempSrc:   PainSc: 0-No pain         Complications: No complications documented.

## 2020-03-29 NOTE — Interval H&P Note (Signed)
History and Physical Interval Note:  03/29/2020 7:17 AM  Cynthia [name]  has presented today for surgery, with the diagnosis of INCISIONAL HERNIA.  The various methods of treatment have been discussed with the patient and family. After consideration of risks, benefits and other options for treatment, the patient has consented to  Procedure(s): [name] (N/A) as a surgical intervention.  The patient's history has been reviewed, patient examined, no change in status, stable for surgery.  I have reviewed the patient's chart and labs.  Questions were answered to the patient's satisfaction.     [place]

## 2020-03-29 NOTE — Discharge Instructions (Signed)
[facility]  UMBILICAL OR INGUINAL HERNIA REPAIR: POST OP INSTRUCTIONS  Always review your discharge instruction sheet given to you by the facility where your surgery was performed. IF YOU HAVE DISABILITY OR FAMILY LEAVE FORMS, YOU MUST BRING THEM TO THE OFFICE FOR PROCESSING.   DO NOT GIVE THEM TO YOUR DOCTOR.  1. A  prescription for pain medication may be given to you upon discharge.  Take your pain medication as prescribed, if needed.  If narcotic pain medicine is not needed, then you may take acetaminophen (Tylenol) or ibuprofen (Advil) as needed. 2. Take your usually prescribed medications unless otherwise directed. If you need a refill on your pain medication, please contact your pharmacy.  They will contact our office to request authorization. Prescriptions will not be filled after 5 pm or on week-ends. 3. You should follow a light diet the first 24 hours after arrival home, such as soup and crackers, etc.  Be sure to include lots of fluids daily.  Resume your normal diet the day after surgery. 4.Most patients will experience some swelling and bruising around the umbilicus or in the groin and scrotum.  Ice packs and reclining will help.  Swelling and bruising can take several days to resolve.  6. It is common to experience some constipation if taking pain medication after surgery.  Increasing fluid intake and taking a stool softener (such as Colace) will usually help or prevent this problem from occurring.  A mild laxative (Milk of Magnesia or Miralax) should be taken according to package directions if there are no bowel movements after 48 hours. 7. Unless discharge instructions indicate otherwise, you may remove your bandages 24-48 hours after surgery, and you may shower at that time.  You may have steri-strips (small skin tapes) in place directly over the incision.  These strips should be left on the skin for 7-10 days.  If your surgeon used skin glue on the  incision, you may shower in 24 hours.  The glue will flake off over the next 2-3 weeks.  Any sutures or staples will be removed at the office during your follow-up visit. 8. ACTIVITIES:  You may resume regular (light) daily activities beginning the next day--such as daily self-care, walking, climbing stairs--gradually increasing activities as tolerated.  You may have sexual intercourse when it is comfortable.  Refrain from any heavy lifting or straining until approved by your doctor.  a.You may drive when you are no longer taking prescription pain medication, you can comfortably wear a seatbelt, and you can safely maneuver your car and apply brakes. b.RETURN TO WORK:   _____________________________________________  9.You should see your doctor in the office for a follow-up appointment approximately 2-3 weeks after your surgery.  Make sure that you call for this appointment within a day or two after you arrive home to insure a convenient appointment time. 10.OTHER INSTRUCTIONS: _________________________    _____________________________________  WHEN TO CALL YOUR DOCTOR: 1. Fever over 101.0 2. Inability to urinate 3. Nausea and/or vomiting 4. Extreme swelling or bruising 5. Continued bleeding from incision. 6. Increased pain, redness, or drainage from the incision  The clinic staff is available to answer your questions during regular business hours.  Please don't hesitate to call and ask to speak to one of the nurses for clinical concerns.  If you have a medical emergency, go to the nearest emergency room or call 911.  A surgeon from [facility] is always on call at the hospital     [address], [place], [place], [place]  [address] ?  [address], [place], [place]   [address] [phone] ? [phone] ? FAX [phone] Web site: [email or web address] Surgical drains are used to remove extra fluid that normally builds up in a  surgical wound after surgery. A surgical drain helps to heal a surgical wound. Different kinds of surgical drains include:  Active drains. These drains use suction to pull drainage away from the surgical wound. Drainage flows through a tube to a container outside of the body. With these drains, you need to keep the bulb or the drainage container flat (compressed) at all times, except while you empty it. Flattening the bulb or container creates suction.  Passive drains. These drains allow fluid to drain naturally, by gravity. Drainage flows through a tube to a bandage (dressing) or a container outside of the body. Passive drains do not need to be emptied. A drain is placed during surgery. Right after surgery, drainage is usually bright red and a little thicker than water. The drainage may gradually turn yellow or pink and become thinner. It is likely that your health care provider will remove the drain when the drainage stops or when the amount decreases to 1-2 Tbsp (15-30 mL) during a 24-hour period. Supplies needed:  Tape.  Germ-free cleaning solution (sterile saline).  Cotton swabs.  Split gauze drain sponge: 4 x 4 inches (10 x 10 cm).  Gauze square: 4 x 4 inches (10 x 10 cm). How to care for your surgical drain Care for your drain as told by your health care provider. This is important to help prevent infection. If your drain is placed at your back, or any other hard-to-reach area, ask another person to assist you in performing the following tasks: General care  Keep the skin around the drain dry and covered with a dressing at all times.  Check your drain area every day for signs of infection. Check for: ? Redness, swelling, or pain. ? Pus or a bad smell. ? Cloudy drainage. ? Tenderness or pressure at the drain exit site. Changing the dressing Follow instructions from your health care provider about how to change your dressing. Change your dressing at least once a day. Change it more  often if needed to keep the dressing dry. Make sure you: 1. Gather your supplies. 2. Wash your hands with soap and water before you change your dressing. If soap and water are not available, use hand sanitizer. 3. Remove the old dressing. Avoid using scissors to do that. 4. Wash your hands with soap and water again after removing the old dressing. 5. Use sterile saline to clean your skin around the drain. You may need to use a cotton swab to clean the skin. 6. Place the tube through the slit in a drain sponge. Place the drain sponge so that it covers your wound. 7. Place the gauze square or another drain sponge on top of the drain sponge that is on the wound. Make sure the tube is between those layers. 8. Tape the dressing to your skin. 9. Tape the drainage tube to your skin 1-2 inches (2.5-5 cm) below the place where the tube enters your body. Taping keeps the tube from pulling on any stitches (sutures) that you have. 10. Wash your hands with soap and water. 11. Write down the color of your drainage and how often you change your dressing. How to empty your active drain 1. Make sure that  you have a measuring cup that you can empty your drainage into. 2. Wash your hands with soap and water. If soap and water are not available, use hand sanitizer. 3. Loosen any pins or clips that hold the tube in place. 4. If your health care provider tells you to strip the tube to prevent clots and tube blockages: ? Hold the tube at the skin with one hand. Use your other hand to pinch the tubing with your thumb and first finger. ? Gently move your fingers down the tube while squeezing very lightly. This clears any drainage, clots, or tissue from the tube. ? You may need to do this several times each day to keep the tube clear. Do not pull on the tube. 5. Open the bulb cap or the drain plug. Do not touch the inside of the cap or the bottom of the plug. 6. Turn the device upside down and gently squeeze. 7. Empty  all of the drainage into the measuring cup. 8. Compress the bulb or the container and replace the cap or the plug. To compress the bulb or the container, squeeze it firmly in the middle while you close the cap or plug the container. 9. Write down the amount of drainage that you have in each 24-hour period. If you have less than 2 Tbsp (30 mL) of drainage during 24 hours, contact your health care provider. 10. Flush the drainage down the toilet. 11. Wash your hands with soap and water.   Contact a health care provider if:  You have redness, swelling, or pain around your drain area.  You have pus or a bad smell coming from your drain area.  You have a fever or chills.  The skin around your drain is warm to the touch.  The amount of drainage that you have is increasing instead of decreasing.  You have drainage that is cloudy.  There is a sudden stop or a sudden decrease in the amount of drainage that you have.  Your drain tube falls out.  Your active drain does not stay compressed after you empty it. Summary  Surgical drains are used to remove extra fluid that normally builds up in a surgical wound after surgery.  Different kinds of surgical drains include active drains and passive drains. Active drains use suction to pull drainage away from the surgical wound, and passive drains allow fluid to drain naturally.  It is important to care for your drain to prevent infection. If your drain is placed at your back, or any other hard-to-reach area, ask another person to assist you.  Contact your health care provider if you have redness, swelling, or pain around your drain area. This information is not intended to replace advice given to you by your health care provider. Make sure you discuss any questions you have with your health care provider. Document Revised: 01/27/2018 Document Reviewed: 01/27/2018 Elsevier Patient Education  [place] [employer] in 48 hours

## 2020-03-30 ENCOUNTER — Encounter (HOSPITAL_COMMUNITY): Payer: Self-pay | Admitting: Surgery

## 2020-03-30 ENCOUNTER — Telehealth: Payer: Self-pay | Admitting: Internal Medicine

## 2020-03-30 NOTE — Telephone Encounter (Signed)
Pt is requesting a refill on amiodarone. This medication has been changed by another provider. Pt is requesting Dr. Caryl Comes send an new Rx with this change. Would Dr. Caryl Comes like to send this medication into pt's pharmacy as requested? Please address

## 2020-03-30 NOTE — Telephone Encounter (Signed)
*  STAT* If patient is at the pharmacy, call can be transferred to refill team.   1. Which medications need to be refilled? (please list name of each medication and dose if known)  amiodarone (PACERONE) 200 MG tablet  2. Which pharmacy/location (including street and city if local pharmacy) is medication to be sent to? [id number] - [place], [place] - [address]  3. Do they need a 30 day or 90 day supply? 90   Patient needs updated rx to show that she is taking this medication every day. The old rx her pharmacy has on file has dosage instructions to take M-F. Please change rx and send to pharmacy

## 2020-04-03 MED ORDER — AMIODARONE HCL 200 MG PO TABS: 200.0000 mg | ORAL_TABLET | Freq: Every day | ORAL | 0 refills | Status: DC

## 2020-04-03 NOTE — Telephone Encounter (Signed)
Amiodarone 200mg  - 1 tablet by mouth daily sent to pharmacy on file.  #90 with 0RF as pt is due to be seen 06/2020.

## 2020-04-03 NOTE — Telephone Encounter (Signed)
Attempted phone call to pt and left voicemail message to contact RN at [phone]. 

## 2020-04-05 NOTE — Telephone Encounter (Signed)
Attempted phone call to pt.  Left voicemail message to contact RN at [phone]. 

## 2020-04-05 NOTE — Telephone Encounter (Signed)
Pt has been scheduled for follow up with Dr Caryl Comes for 06/21/2020.  Pt is aware.

## 2020-04-23 ENCOUNTER — Ambulatory Visit (INDEPENDENT_AMBULATORY_CARE_PROVIDER_SITE_OTHER): Payer: Medicare PPO

## 2020-04-23 DIAGNOSIS — R55 Syncope and collapse: Secondary | ICD-10-CM

## 2020-04-24 LAB — CUP PACEART REMOTE DEVICE CHECK
Date Time Interrogation Session: 20220416104202
Implantable Pulse Generator Implant Date: 20210521

## 2020-05-10 NOTE — Progress Notes (Signed)
[employer] Summary Report / Loop Recorder 

## 2020-05-23 DIAGNOSIS — E039 Hypothyroidism, unspecified: Secondary | ICD-10-CM | POA: Diagnosis not present

## 2020-05-23 DIAGNOSIS — E559 Vitamin D deficiency, unspecified: Secondary | ICD-10-CM | POA: Diagnosis not present

## 2020-05-23 DIAGNOSIS — I10 Essential (primary) hypertension: Secondary | ICD-10-CM | POA: Diagnosis not present

## 2020-05-23 DIAGNOSIS — R7303 Prediabetes: Secondary | ICD-10-CM | POA: Diagnosis not present

## 2020-05-23 DIAGNOSIS — E785 Hyperlipidemia, unspecified: Secondary | ICD-10-CM | POA: Diagnosis not present

## 2020-05-23 DIAGNOSIS — Z5181 Encounter for therapeutic drug level monitoring: Secondary | ICD-10-CM | POA: Diagnosis not present

## 2020-05-23 DIAGNOSIS — E538 Deficiency of other specified B group vitamins: Secondary | ICD-10-CM | POA: Diagnosis not present

## 2020-05-23 DIAGNOSIS — E611 Iron deficiency: Secondary | ICD-10-CM | POA: Diagnosis not present

## 2020-05-28 ENCOUNTER — Ambulatory Visit (INDEPENDENT_AMBULATORY_CARE_PROVIDER_SITE_OTHER): Payer: Medicare PPO

## 2020-05-28 DIAGNOSIS — R55 Syncope and collapse: Secondary | ICD-10-CM

## 2020-05-28 DIAGNOSIS — R350 Frequency of micturition: Secondary | ICD-10-CM | POA: Diagnosis not present

## 2020-05-28 DIAGNOSIS — Z85528 Personal history of other malignant neoplasm of kidney: Secondary | ICD-10-CM | POA: Diagnosis not present

## 2020-05-29 LAB — CUP PACEART REMOTE DEVICE CHECK
Date Time Interrogation Session: 20220519205326
Implantable Pulse Generator Implant Date: 20210521

## 2020-06-14 DIAGNOSIS — R350 Frequency of micturition: Secondary | ICD-10-CM | POA: Diagnosis not present

## 2020-06-16 DIAGNOSIS — R350 Frequency of micturition: Secondary | ICD-10-CM | POA: Diagnosis not present

## 2020-06-18 NOTE — Progress Notes (Signed)
[employer] Summary Report / Loop Recorder 

## 2020-06-21 ENCOUNTER — Encounter: Payer: Medicare PPO | Admitting: Internal Medicine

## 2020-06-23 ENCOUNTER — Other Ambulatory Visit: Payer: Self-pay | Admitting: Internal Medicine

## 2020-06-26 ENCOUNTER — Ambulatory Visit (INDEPENDENT_AMBULATORY_CARE_PROVIDER_SITE_OTHER): Payer: Medicare PPO

## 2020-06-26 DIAGNOSIS — R55 Syncope and collapse: Secondary | ICD-10-CM | POA: Diagnosis not present

## 2020-06-27 LAB — CUP PACEART REMOTE DEVICE CHECK
Date Time Interrogation Session: 20220621105019
Implantable Pulse Generator Implant Date: 20210521

## 2020-07-10 ENCOUNTER — Other Ambulatory Visit: Payer: Self-pay | Admitting: *Deleted

## 2020-07-10 DIAGNOSIS — I4891 Unspecified atrial fibrillation: Secondary | ICD-10-CM

## 2020-07-10 MED ORDER — APIXABAN 5 MG PO TABS: 5.0000 mg | ORAL_TABLET | Freq: Two times a day (BID) | ORAL | 5 refills | Status: DC

## 2020-07-10 NOTE — Telephone Encounter (Signed)
Eliquis 5mg  paper refill request received. Patient is 75 years old, weight-68.9kg, Crea-1.00 on 03/29/20, Diagnosis-Afib, and last seen by Dr. Caryl Comes on 07/19/2019 and pending appt on 10/08/2020. Dose is appropriate based on dosing criteria. Will send in refill to requested pharmacy.

## 2020-07-16 NOTE — Progress Notes (Signed)
[employer] Summary Report / Loop Recorder 

## 2020-07-20 ENCOUNTER — Other Ambulatory Visit: Payer: Self-pay | Admitting: Internal Medicine

## 2020-07-24 ENCOUNTER — Telehealth: Payer: Self-pay | Admitting: Internal Medicine

## 2020-07-24 DIAGNOSIS — D692 Other nonthrombocytopenic purpura: Secondary | ICD-10-CM

## 2020-07-24 NOTE — Telephone Encounter (Signed)
Pt c/o medication issue:  1. Name of Medication: apixaban (ELIQUIS) 5 MG TABS tablet  2. How are you currently taking this medication (dosage and times per day)? Take 1 tablet (5 mg total) by mouth 2 (two) times daily.  3. Are you having a reaction (difficulty breathing--STAT)? No  4. What is your medication issue? Cynthia [name] is having a outbreak on the front of her arms due to taking Eliquis. Cynthia [name] said she knows it's from the medicine because she looked it up on the Internet and saw some pics and it looks just like her outbreak on her arms. Cynthia [name] is leaving to go to the beach in September and she wants to get a spray tan, she wants to know if its ok for her to get a spray tan. Cynthia [name] is getting ready to leave the house but it's okay to leave a voicemail

## 2020-07-25 NOTE — Telephone Encounter (Signed)
Attempted phone call to pt and left voicemail message to contact RN at [phone]. 

## 2020-07-25 NOTE — Telephone Encounter (Signed)
Patient was returning call 

## 2020-07-26 NOTE — Telephone Encounter (Signed)
Spoke with pt who complains of Purpura of her arms over the last 6 months.  Pt denies pain or injury.  She states she is taking her Eliquis 5mg  - 1 tablet by mouth bid.She only takes Tylenol prn for headache, pain or fever.  She would like to get a spray tan before going to [place] next month but is concerned that the discolorations are related to her anticoagulant medication. Pt denies known active bleeding. Asked that pt send a picture of her arms through her MyChart to share with Dr Caryl Comes.  Pt is agreeable and thanked Therapist, sports.

## 2020-07-27 ENCOUNTER — Other Ambulatory Visit: Payer: Medicare PPO | Admitting: *Deleted

## 2020-07-27 ENCOUNTER — Other Ambulatory Visit: Payer: Self-pay

## 2020-07-27 DIAGNOSIS — D692 Other nonthrombocytopenic purpura: Secondary | ICD-10-CM | POA: Diagnosis not present

## 2020-07-27 NOTE — Telephone Encounter (Signed)
Cynthia [name]  if she has "purpura" she needs to be seen by somebody with a CBC [employer]

## 2020-07-27 NOTE — Telephone Encounter (Signed)
Spoke with pt and advised Dr Caryl Comes would like for her to come in for lab work.  Pt states she can come today.  Order placed for CBC with Diff.

## 2020-07-28 LAB — CBC WITH DIFFERENTIAL/PLATELET
Basophils Absolute: 0.1 10*3/uL (ref 0.0–0.2)
Basos: 1 %
EOS (ABSOLUTE): 0.1 10*3/uL (ref 0.0–0.4)
Eos: 2 %
Hematocrit: 40.7 % (ref 34.0–46.6)
Hemoglobin: 12.8 g/dL (ref 11.1–15.9)
Immature Grans (Abs): 0 10*3/uL (ref 0.0–0.1)
Immature Granulocytes: 0 %
Lymphocytes Absolute: 1.3 10*3/uL (ref 0.7–3.1)
Lymphs: 21 %
MCH: 29.1 pg (ref 26.6–33.0)
MCHC: 31.4 g/dL — ABNORMAL LOW (ref 31.5–35.7)
MCV: 93 fL (ref 79–97)
Monocytes Absolute: 0.5 10*3/uL (ref 0.1–0.9)
Monocytes: 8 %
Neutrophils Absolute: 4.2 10*3/uL (ref 1.4–7.0)
Neutrophils: 68 %
Platelets: 391 10*3/uL (ref 150–450)
RBC: 4.4 x10E6/uL (ref 3.77–5.28)
RDW: 12.8 % (ref 11.7–15.4)
WBC: 6.2 10*3/uL (ref 3.4–10.8)

## 2020-07-30 ENCOUNTER — Encounter: Payer: Self-pay | Admitting: Internal Medicine

## 2020-07-30 ENCOUNTER — Ambulatory Visit (INDEPENDENT_AMBULATORY_CARE_PROVIDER_SITE_OTHER): Payer: Medicare PPO

## 2020-07-30 ENCOUNTER — Other Ambulatory Visit: Payer: Self-pay

## 2020-07-30 ENCOUNTER — Ambulatory Visit: Payer: Medicare PPO | Admitting: Internal Medicine

## 2020-07-30 VITALS — BP 118/60 | HR 61 | Ht 65.0 in | Wt 152.6 lb

## 2020-07-30 DIAGNOSIS — E032 Hypothyroidism due to medicaments and other exogenous substances: Secondary | ICD-10-CM

## 2020-07-30 DIAGNOSIS — I4891 Unspecified atrial fibrillation: Secondary | ICD-10-CM

## 2020-07-30 DIAGNOSIS — R55 Syncope and collapse: Secondary | ICD-10-CM

## 2020-07-30 NOTE — Progress Notes (Signed)
Patient Care Team: [name], MD as PCP - General (Family Medicine) Elouise Munroe, MD as PCP - Cardiology (Cardiology) Fanny Skates, MD as Consulting Physician (General Surgery) Magrinat, [name], MD as Consulting Physician (Oncology) Kyung Rudd, MD as Consulting Physician (Radiation Oncology) Delice Bison, Charlestine Massed, NP as Nurse Practitioner (Hematology and Oncology) Alexis Frock, MD as Consulting Physician (Urology) Suella Broad, MD as Consulting Physician (Physical Medicine and Rehabilitation) Ria Clock, MD as Attending Physician (Radiology)   HPI  Cynthia [name] is a 75 y.o. female Seen in followup for syncope w hx of WCT and known afib rx w Amio; on anticoagulation with on Apixoban    She has a history of near syncope in the context of wide-complex tachycardia.     Cardiac MRI suggested left ventricular noncompaction  Some orthostatic hypotension and recurrent syncope 5/21  Nephrology had noted low blood pressures it is stopped her metoprolol. And her blood pressure has been doing well.  The patient denies chest pain, nocturnal dyspnea, orthopnea or peripheral edema.  There have been no palpitations,  or syncope.  Complains of dyspnea on exertion particularly on hot days walking up hills, but with her ADLs not.  Also has orthostatic lightheadedness particularly in the mornings.  Have encouraged her in the past to have water by her bedside.  She does, but she does not drink it..   Patient denies symptoms of GI intolerance, sun sensitivity, neurological symptoms attributable to amiodarone.      Her other complaint is bruising DATE TEST EF   12/20 Echo   50-55 %   12/20 cMRI  *52* %         Date Cr K Hgb PLT  TSH LFTs  12/20      2.85 21  5/21 1.11 3.7 11.9<<13.1       6/21 1.17 3.8   0.654   3/22 1.00 3.7 12.8 391   18     Records and Results Reviewed   Past Medical History:  Diagnosis Date   AICD (automatic  cardioverter/defibrillator) present    Medtr[employer]   Anticoagulant long-term use    eliquis--- managed by cardiology   Anxiety    Arthritis    Bradycardia    Cancer of kidney ([place])    DDD (degenerative disc disease), lumbar    Depression    Dyspnea    ON EXERTION   First degree heart block    Genetic testing 06/25/2016   Ms. Busser underwent genetic counseling and testing for hereditary cancer syndromes on 06/17/2016. Her results were negative for mutations in all 46 genes analyzed by [facility]'s 46-gene Common Hereditary Cancers Panel. Genes analyzed include: APC, ATM, AXIN2, BARD1, BMPR1A, BRCA1, BRCA2, BRIP1, CDH1, CDKN2A, CHEK2, CTNNA1, DICER1, EPCAM, GREM1, HOXB13, KIT, MEN1, MLH1, MSH2, MSH3, MSH6, MUTYH, NBN,   GERD (gastroesophageal reflux disease)    occasional,  takes pepcid   Hematuria    History of radiation therapy 05/22/2016 to 06/20/2016   right breast cancer   Hypertension    followd by pcp---  (02-22-2019 per pt never had stress test)   Hypothyroidism    followed by pcp   IBS (irritable bowel syndrome)    Incomplete right bundle branch block    Insomnia    Malignant neoplasm of upper-inner quadrant of right breast in female, estrogen receptor positive [facility]) oncologist--- dr [name]   dx 03/ 2018,  Stage IA, IDC, ER/PR +;  04-01-2016 s/p  right breast lumpectomy w/ node dissection's;  completed radiation 06-20-2016   MVP (mitral valve prolapse)    per echo 12-11-2018 in epic, mild    NSVT (nonsustained ventricular tachycardia) ([place]) followed by cardiology   12-10-2018  hospital admission ,  refer to discharge note 12-14-2018 for treatement   PAF (paroxysmal atrial fibrillation) [facility]) primary cardiologist--- dr Margaretann Loveless   newly dx 12-10-2018  admission in epic, Afib w/ RVR and NSVT;   in epic TTE 12-11-2018 showed ef 50-55%, mild MVP,  mild AV sclerosis without stenosis;   Cardiac MRI 12-13-2018 in epic   Renal mass, left    pelvis    Restless leg syndrome    occasional    Type 2 diabetes mellitus ([place])    followed by pcp---  (02-22-2019 does not check blood sugar's)    Past Surgical History:  Procedure Laterality Date   BREAST LUMPECTOMY WITH RADIOACTIVE SEED AND SENTINEL LYMPH NODE BIOPSY Right 04/01/2016   Procedure: RADIOACTIVE SEED GUIDED RIGHT BREAST LUMPECTOMY WITH RIGHT AXILLARY SENTINEL LYMPH NODE BIOPSY.;  Surgeon: Fanny Skates, MD;  Location: [place];  Service: General;  Laterality: Right;   CARDIAC CATHETERIZATION  12/11/2018   CATARACT EXTRACTION W/ INTRAOCULAR LENS  IMPLANT, BILATERAL  1980s   COLONOSCOPY     CYSTOSCOPY/RETROGRADE/URETEROSCOPY N/A 03/02/2019   Procedure: CYSTOSCOPY/LEFT RETROGRADE/URETEROSCOPY/  BIOPSY/  STENT;  Surgeon: Ceasar Mons, MD;  Location: [facility];  Service: Urology;  Laterality: N/A;   INCISIONAL HERNIA REPAIR N/A 03/29/2020   Procedure: REPAIR OF INCISIONAL HERNIA WITH MESH;  Surgeon: Erroll Luna, MD;  Location: [place];  Service: General;  Laterality: N/A;   INSERTION OF ICD     Ragan LAPAROSCOPIC NEPHROURETERECTOMY Left 04/27/2019   Procedure: XI ROBOT ASSITED LAPAROSCOPIC NEPHROURETERECTOMY;  Surgeon: Alexis Frock, MD;  Location: [facility];  Service: Urology;  Laterality: Left;  3.5 HRS   TONSILLECTOMY  age 56    Current Meds  Medication Sig   ALPRAZolam (XANAX) 1 MG tablet Take 1.5 mg by mouth at bedtime.   amiodarone (PACERONE) 200 MG tablet TAKE 1 TABLET BY MOUTH EVERY DAY   anastrozole (ARIMIDEX) 1 MG tablet Take 1 tablet (1 mg total) by mouth daily.   apixaban (ELIQUIS) 5 MG TABS tablet Take 1 tablet (5 mg total) by mouth 2 (two) times daily.   Cholecalciferol (VITAMIN D) 50 MCG (2000 UT) CAPS Take 2,000 Units by mouth daily.   Choline Fenofibrate (FENOFIBRIC ACID) 135 MG CPDR Take 135 mg by mouth daily.    ferrous sulfate 325 (65 FE) MG tablet Take 325 mg by mouth daily with breakfast.    imipramine (TOFRANIL) 50  MG tablet Take 50 mg by mouth at bedtime.   levothyroxine (SYNTHROID, LEVOTHROID) 112 MCG tablet Take 112 mcg by mouth at bedtime.    metFORMIN (GLUCOPHAGE) 500 MG tablet Take 500 mg by mouth 2 (two) times daily.    psyllium (REGULOID) 0.52 g capsule Take 4.16 g by mouth daily.   triamterene-hydrochlorothiazide (MAXZIDE-25) 37.5-25 MG tablet Take 1 tablet by mouth daily.    No Known Allergies    Review of Systems negative except from HPI and PMH  Physical Exam Ht 5' 5"  (1.651 m)   Wt 152 lb 9.6 oz (69.2 kg)   BMI 25.39 kg/m  Well developed and nourished in no acute distress HENT normal Neck supple with JVP-  flat  Clear Regular rate and rhythm, 2/6 murmurs or gallops Abd-soft with active BS No Clubbing cyanosis  edema Skin-warm and dry superficial bruising A & Oriented  Grossly normal sensory and motor function  ECG sinus at 61 Interval 22/09/44  Assessment and  Plan Atrial fibrillation with a rapid rate   Presyncope/syncope -probably orthostatic   Atrial fibrillation pauses/posttermination pauses   Wide-complex tachycardia-regular >> fasicular VT   Hypertension  Dysgraphia  Psychosocial stress-husband with brain cancer  No interval syncope.  But still struggles with orthostasis.  I again reviewed the physiology of orthostasis the importance of fluid repletion and have encouraged her also to raise the head of her bed as many of her symptoms are in the morning.  No interval atrial fibrillation.  We will continue her on amiodarone.  Not withstanding her orthostasis, she prefers to be on 200 mg a day.  We will check her amiodarone surveillance laboratories today  Mild superficial bleeding.  We will continue her on her Eliquis at 5 mg twice daily.  Platelet count was normal     Current medicines are reviewed at length with the patient today .  The patient does not  have concerns regarding medicines.

## 2020-07-30 NOTE — Patient Instructions (Signed)
Medication Instructions:  Your physician has recommended you make the following change in your medication:   ** Hold you Maxide due to your orthostatic hypotension.  *If you need a refill on your cardiac medications before your next appointment, please call your pharmacy*   Lab Work:  TSH today  If you have labs (blood work) drawn today and your tests are completely normal, you will receive your results only by: [place] (if you have MyChart) OR A paper copy in the mail If you have any lab test that is abnormal or we need to change your treatment, we will call you to review the results.   Testing/Procedures: None ordered.    Follow-Up: At [facility], you and your health needs are our priority.  As part of our continuing mission to provide you with exceptional heart care, we have created designated Provider Care Teams.  These Care Teams include your primary Cardiologist (physician) and Advanced Practice Providers (APPs -  Physician Assistants and Nurse Practitioners) who all work together to provide you with the care you need, when you need it.  We recommend signing up for the patient portal called "MyChart".  Sign up information is provided on this After Visit Summary.  MyChart is used to connect with patients for Virtual Visits (Telemedicine).  Patients are able to view lab/test results, encounter notes, upcoming appointments, etc.  Non-urgent messages can be sent to your provider as well.   To learn more about what you can do with MyChart, go to [email or web address].    Your next appointment:   6 month(s)  The format for your next appointment:   In Person  Provider:   You will see one of the following Advanced Practice Providers on your designated Care Team:   Tommye Standard, [place] "[facility]" [place], [place]

## 2020-07-31 LAB — CUP PACEART REMOTE DEVICE CHECK
Date Time Interrogation Session: 20220724105334
Implantable Pulse Generator Implant Date: 20210521

## 2020-07-31 LAB — TSH: TSH: 1.36 u[IU]/mL (ref 0.450–4.500)

## 2020-08-24 NOTE — Progress Notes (Signed)
[employer] Summary Report / Loop Recorder 

## 2020-08-29 DIAGNOSIS — Z1231 Encounter for screening mammogram for malignant neoplasm of breast: Secondary | ICD-10-CM | POA: Diagnosis not present

## 2020-09-03 ENCOUNTER — Ambulatory Visit (INDEPENDENT_AMBULATORY_CARE_PROVIDER_SITE_OTHER): Payer: Medicare PPO

## 2020-09-03 DIAGNOSIS — I4891 Unspecified atrial fibrillation: Secondary | ICD-10-CM | POA: Diagnosis not present

## 2020-09-04 LAB — CUP PACEART REMOTE DEVICE CHECK
Date Time Interrogation Session: 20220826105703
Implantable Pulse Generator Implant Date: 20210521

## 2020-09-14 NOTE — Progress Notes (Signed)
[employer] Summary Report / Loop Recorder 

## 2020-09-17 ENCOUNTER — Encounter (HOSPITAL_BASED_OUTPATIENT_CLINIC_OR_DEPARTMENT_OTHER): Payer: Self-pay

## 2020-09-17 ENCOUNTER — Emergency Department (HOSPITAL_BASED_OUTPATIENT_CLINIC_OR_DEPARTMENT_OTHER)
Admission: EM | Admit: 2020-09-17 | Discharge: 2020-09-17 | Disposition: A | Discharge status: Home / Self Care | Payer: Medicare PPO | Attending: Emergency Medicine | Admitting: Emergency Medicine

## 2020-09-17 ENCOUNTER — Other Ambulatory Visit: Payer: Self-pay

## 2020-09-17 DIAGNOSIS — Z5321 Procedure and treatment not carried out due to patient leaving prior to being seen by health care provider: Secondary | ICD-10-CM | POA: Diagnosis not present

## 2020-09-17 DIAGNOSIS — G4700 Insomnia, unspecified: Secondary | ICD-10-CM | POA: Diagnosis not present

## 2020-09-17 DIAGNOSIS — I10 Essential (primary) hypertension: Secondary | ICD-10-CM | POA: Insufficient documentation

## 2020-09-17 NOTE — ED Triage Notes (Signed)
Pt arrives POV, stating her blood pressure has been going up over the last several days. She is not currently taking any medication for hypertension. States she also has insomnia.

## 2020-09-18 DIAGNOSIS — G4700 Insomnia, unspecified: Secondary | ICD-10-CM | POA: Diagnosis not present

## 2020-09-19 ENCOUNTER — Other Ambulatory Visit: Payer: Self-pay | Admitting: *Deleted

## 2020-09-19 DIAGNOSIS — C50211 Malignant neoplasm of upper-inner quadrant of right female breast: Secondary | ICD-10-CM

## 2020-09-20 ENCOUNTER — Ambulatory Visit (HOSPITAL_BASED_OUTPATIENT_CLINIC_OR_DEPARTMENT_OTHER): Payer: Medicare PPO | Admitting: Oncology

## 2020-09-20 ENCOUNTER — Other Ambulatory Visit: Payer: Medicare PPO

## 2020-09-20 DIAGNOSIS — C50211 Malignant neoplasm of upper-inner quadrant of right female breast: Secondary | ICD-10-CM

## 2020-09-20 DIAGNOSIS — Z170 Estrogen receptor positive status [ER+]: Secondary | ICD-10-CM

## 2020-09-20 NOTE — Progress Notes (Signed)
No show

## 2020-09-21 ENCOUNTER — Telehealth: Payer: Self-pay | Admitting: Oncology

## 2020-09-21 NOTE — Telephone Encounter (Signed)
Scheduled appointment per 09/15 los.Left message.

## 2020-10-07 ENCOUNTER — Other Ambulatory Visit: Payer: Self-pay | Admitting: Oncology

## 2020-10-08 ENCOUNTER — Ambulatory Visit (INDEPENDENT_AMBULATORY_CARE_PROVIDER_SITE_OTHER): Payer: Medicare PPO

## 2020-10-08 ENCOUNTER — Encounter: Payer: Medicare PPO | Admitting: Internal Medicine

## 2020-10-08 DIAGNOSIS — R55 Syncope and collapse: Secondary | ICD-10-CM | POA: Diagnosis not present

## 2020-10-08 LAB — CUP PACEART REMOTE DEVICE CHECK
Date Time Interrogation Session: 20220928105727
Implantable Pulse Generator Implant Date: 20210521

## 2020-10-16 NOTE — Progress Notes (Signed)
[employer] Summary Report / Loop Recorder 

## 2020-10-29 DIAGNOSIS — G4700 Insomnia, unspecified: Secondary | ICD-10-CM | POA: Diagnosis not present

## 2020-11-06 LAB — CUP PACEART REMOTE DEVICE CHECK
Date Time Interrogation Session: 20221031110217
Implantable Pulse Generator Implant Date: 20210521

## 2020-11-12 ENCOUNTER — Ambulatory Visit (INDEPENDENT_AMBULATORY_CARE_PROVIDER_SITE_OTHER): Payer: Medicare PPO

## 2020-11-12 DIAGNOSIS — R55 Syncope and collapse: Secondary | ICD-10-CM

## 2020-11-19 DIAGNOSIS — C652 Malignant neoplasm of left renal pelvis: Secondary | ICD-10-CM | POA: Diagnosis not present

## 2020-11-20 NOTE — Progress Notes (Signed)
[employer] Summary Report / Loop Recorder 

## 2020-11-21 DIAGNOSIS — M2041 Other hammer toe(s) (acquired), right foot: Secondary | ICD-10-CM | POA: Diagnosis not present

## 2020-11-21 DIAGNOSIS — L84 Corns and callosities: Secondary | ICD-10-CM | POA: Diagnosis not present

## 2020-11-21 DIAGNOSIS — M79671 Pain in right foot: Secondary | ICD-10-CM | POA: Diagnosis not present

## 2020-11-23 ENCOUNTER — Other Ambulatory Visit: Payer: Self-pay

## 2020-11-23 ENCOUNTER — Ambulatory Visit (HOSPITAL_COMMUNITY)
Admission: RE | Admit: 2020-11-23 | Discharge: 2020-11-23 | Disposition: A | Discharge status: Home / Self Care | Payer: Medicare PPO | Source: Ambulatory Visit | Attending: Urology | Admitting: Urology

## 2020-11-23 ENCOUNTER — Other Ambulatory Visit (HOSPITAL_COMMUNITY): Payer: Self-pay | Admitting: Urology

## 2020-11-23 DIAGNOSIS — C652 Malignant neoplasm of left renal pelvis: Secondary | ICD-10-CM

## 2020-11-23 DIAGNOSIS — M47814 Spondylosis without myelopathy or radiculopathy, thoracic region: Secondary | ICD-10-CM | POA: Diagnosis not present

## 2020-11-23 DIAGNOSIS — R918 Other nonspecific abnormal finding of lung field: Secondary | ICD-10-CM | POA: Diagnosis not present

## 2020-11-26 DIAGNOSIS — N3000 Acute cystitis without hematuria: Secondary | ICD-10-CM | POA: Diagnosis not present

## 2020-11-26 DIAGNOSIS — Z905 Acquired absence of kidney: Secondary | ICD-10-CM | POA: Diagnosis not present

## 2020-11-26 DIAGNOSIS — C652 Malignant neoplasm of left renal pelvis: Secondary | ICD-10-CM | POA: Diagnosis not present

## 2020-11-26 DIAGNOSIS — K439 Ventral hernia without obstruction or gangrene: Secondary | ICD-10-CM | POA: Diagnosis not present

## 2020-11-26 DIAGNOSIS — K5730 Diverticulosis of large intestine without perforation or abscess without bleeding: Secondary | ICD-10-CM | POA: Diagnosis not present

## 2020-11-26 DIAGNOSIS — D259 Leiomyoma of uterus, unspecified: Secondary | ICD-10-CM | POA: Diagnosis not present

## 2020-12-11 DIAGNOSIS — F330 Major depressive disorder, recurrent, mild: Secondary | ICD-10-CM | POA: Diagnosis not present

## 2020-12-11 DIAGNOSIS — C50919 Malignant neoplasm of unspecified site of unspecified female breast: Secondary | ICD-10-CM | POA: Diagnosis not present

## 2020-12-11 DIAGNOSIS — I4891 Unspecified atrial fibrillation: Secondary | ICD-10-CM | POA: Diagnosis not present

## 2020-12-11 DIAGNOSIS — G4700 Insomnia, unspecified: Secondary | ICD-10-CM | POA: Diagnosis not present

## 2020-12-11 DIAGNOSIS — E039 Hypothyroidism, unspecified: Secondary | ICD-10-CM | POA: Diagnosis not present

## 2020-12-11 DIAGNOSIS — R7303 Prediabetes: Secondary | ICD-10-CM | POA: Diagnosis not present

## 2020-12-11 DIAGNOSIS — D6869 Other thrombophilia: Secondary | ICD-10-CM | POA: Diagnosis not present

## 2020-12-11 DIAGNOSIS — C689 Malignant neoplasm of urinary organ, unspecified: Secondary | ICD-10-CM | POA: Diagnosis not present

## 2020-12-13 ENCOUNTER — Other Ambulatory Visit: Payer: Self-pay | Admitting: Internal Medicine

## 2020-12-17 ENCOUNTER — Ambulatory Visit (INDEPENDENT_AMBULATORY_CARE_PROVIDER_SITE_OTHER): Payer: Medicare PPO

## 2020-12-17 DIAGNOSIS — R55 Syncope and collapse: Secondary | ICD-10-CM

## 2020-12-18 LAB — CUP PACEART REMOTE DEVICE CHECK
Date Time Interrogation Session: 20221203100434
Implantable Pulse Generator Implant Date: 20210521

## 2020-12-20 ENCOUNTER — Other Ambulatory Visit: Payer: Medicare PPO

## 2020-12-20 ENCOUNTER — Ambulatory Visit (HOSPITAL_BASED_OUTPATIENT_CLINIC_OR_DEPARTMENT_OTHER): Payer: Medicare PPO | Admitting: Oncology

## 2020-12-20 ENCOUNTER — Encounter: Payer: Self-pay | Admitting: Oncology

## 2020-12-20 DIAGNOSIS — Z170 Estrogen receptor positive status [ER+]: Secondary | ICD-10-CM

## 2020-12-20 DIAGNOSIS — C50211 Malignant neoplasm of upper-inner quadrant of right female breast: Secondary | ICD-10-CM

## 2020-12-20 DIAGNOSIS — C689 Malignant neoplasm of urinary organ, unspecified: Secondary | ICD-10-CM

## 2020-12-20 NOTE — Progress Notes (Signed)
[place]  Telephone:(336) [phone] Fax:(336) [phone]     ID: Cynthia [name] DOB: July 13, 1945  MR#: [id number]  QBH#:[id number]  Patient Care Team: [name], MD as PCP - General (Family Medicine) Elouise Munroe, MD as PCP - Cardiology (Cardiology) Fanny Skates, MD as Consulting Physician (General Surgery) Ethelmae Ringel, [name], MD as Consulting Physician (Oncology) Kyung Rudd, MD as Consulting Physician (Radiation Oncology) Delice Bison, Charlestine Massed, NP as Nurse Practitioner (Hematology and Oncology) Alexis Frock, MD as Consulting Physician (Urology) Suella Broad, MD as Consulting Physician (Physical Medicine and Rehabilitation) Ria Clock, MD as Attending Physician (Radiology) Chauncey Cruel, MD OTHER MD:  CHIEF COMPLAINT: Estrogen receptor positive breast cancer  CURRENT TREATMENT: Anastrozole   INTERVAL HISTORY: Cynthia [name] was scheduled today for follow-up of her estrogen receptor positive breast cancer.  However she did not show.    Her most recent DEXA scan was at [facility] 11/02/2017 with a positive T score (1.4).  Since her last visit, she underwent bilateral screening mammography with tomography at [facility] on 08/29/2020 showing: breast density category B; no evidence of malignancy in either breast.    She developed an abdominal wall hernia in 02/2020 following her nephrectomy and cholecystectomy procedure. She proceeded to repair on 03/29/2020 under Dr. Brantley Stage.   REVIEW OF SYSTEMS: [place]    COVID 19 VACCINATION STATUS: [place] x2   BREAST CANCER HISTORY: From the original intake note:  Cynthia [name] had bilateral screening mammography with tomosynthesis at [facility] 02/26/2016. There was a new area of asymmetry in the right breast at the 4:00 position. She was recalled for diagnostic mammography 03/07/2016. The breast density was category B. This found a new oval mass in the right breast upper inner quadrant with indistinct margins. Ultrasound of the  right breast was performed the same day and confirmed a 0.6 cm oval mass in the right breast correlating with mammography findings. There was also a benign 3 mm cyst in the right breast upper inner quadrant middle depth  Biopsy of the right breast mass in question 03/10/2016 found ([id number]) invasive ductal carcinoma, grade 1, estrogen receptor 1 or percent positive, progesterone receptor 95% positive, both with strong staining intensity, with an MIB-1 of 10%, and no HER-2 implication, the signals ratio being 1.40 and the number per cell 2.10.  Her subsequent history is as detailed below.   PAST MEDICAL HISTORY: Past Medical History:  Diagnosis Date   AICD (automatic cardioverter/defibrillator) present    Medtronic   Anticoagulant long-term use    eliquis--- managed by cardiology   Anxiety    Arthritis    Bradycardia    Cancer of kidney ([place])    DDD (degenerative disc disease), lumbar    Depression    Dyspnea    ON EXERTION   First degree heart block    Genetic testing 06/25/2016   Ms. Dunkerson underwent genetic counseling and testing for hereditary cancer syndromes on 06/17/2016. Her results were negative for mutations in all 46 genes analyzed by [facility]'s 46-gene Common Hereditary Cancers Panel. Genes analyzed include: APC, ATM, AXIN2, BARD1, BMPR1A, BRCA1, BRCA2, BRIP1, CDH1, CDKN2A, CHEK2, CTNNA1, DICER1, EPCAM, GREM1, HOXB13, KIT, MEN1, MLH1, MSH2, MSH3, MSH6, MUTYH, NBN,   GERD (gastroesophageal reflux disease)    occasional,  takes pepcid   Hematuria    History of radiation therapy 05/22/2016 to 06/20/2016   right breast cancer   Hypertension    followd by pcp---  (02-22-2019 per pt never had stress test)   Hypothyroidism    followed  by pcp   IBS (irritable bowel syndrome)    Incomplete right bundle branch block    Insomnia    Malignant neoplasm of upper-inner quadrant of right breast in female, estrogen receptor positive [facility]) oncologist--- dr [name]   dx 03/ 2018,   Stage IA, IDC, ER/PR +;  04-01-2016 s/p  right breast lumpectomy w/ node dissection's;  completed radiation 06-20-2016   MVP (mitral valve prolapse)    per echo 12-11-2018 in epic, mild    NSVT (nonsustained ventricular tachycardia) ([place]) followed by cardiology   12-10-2018  hospital admission ,  refer to discharge note 12-14-2018 for treatement   PAF (paroxysmal atrial fibrillation) [facility]) primary cardiologist--- dr Margaretann Loveless   newly dx 12-10-2018  admission in epic, Afib w/ RVR and NSVT;   in epic TTE 12-11-2018 showed ef 50-55%, mild MVP,  mild AV sclerosis without stenosis;   Cardiac MRI 12-13-2018 in epic   Renal mass, left    pelvis    Restless leg syndrome    occasional   Type 2 diabetes mellitus ([place])    followed by pcp---  (02-22-2019 does not check blood sugar's)    PAST SURGICAL HISTORY: Past Surgical History:  Procedure Laterality Date   BREAST LUMPECTOMY WITH RADIOACTIVE SEED AND SENTINEL LYMPH NODE BIOPSY Right 04/01/2016   Procedure: RADIOACTIVE SEED GUIDED RIGHT BREAST LUMPECTOMY WITH RIGHT AXILLARY SENTINEL LYMPH NODE BIOPSY.;  Surgeon: Fanny Skates, MD;  Location: [place];  Service: General;  Laterality: Right;   CARDIAC CATHETERIZATION  12/11/2018   CATARACT EXTRACTION W/ INTRAOCULAR LENS  IMPLANT, BILATERAL  1980s   COLONOSCOPY     CYSTOSCOPY/RETROGRADE/URETEROSCOPY N/A 03/02/2019   Procedure: CYSTOSCOPY/LEFT RETROGRADE/URETEROSCOPY/  BIOPSY/  STENT;  Surgeon: Ceasar Mons, MD;  Location: [facility];  Service: Urology;  Laterality: N/A;   INCISIONAL HERNIA REPAIR N/A 03/29/2020   Procedure: REPAIR OF INCISIONAL HERNIA WITH MESH;  Surgeon: Erroll Luna, MD;  Location: [place];  Service: General;  Laterality: N/A;   INSERTION OF ICD     [place] LAPAROSCOPIC NEPHROURETERECTOMY Left 04/27/2019   Procedure: XI ROBOT ASSITED LAPAROSCOPIC NEPHROURETERECTOMY;  Surgeon: Alexis Frock, MD;   [facility];  Service: Urology;  Laterality: Left;  3.5 HRS   TONSILLECTOMY  age 6    FAMILY HISTORY Family History  Problem Relation Age of Onset   Breast cancer Sister 67       d.54   Leukemia Brother 43   Cervical cancer Sister 41   Ovarian cancer Maternal Aunt 92       d.92s   Breast cancer Other 13  Patient's father died at the age of 32 following a fall. He had a history of cerebral palsy. The patient's mother died from congestive heart failure at the age of 47. The patient had 2 sisters. One was diagnosed with colon cancer at age 25. She survives. The second one died from breast cancer diagnosed age 34. She neglected the disease and it may have been present before she was 39. One of the patient's brothers was diagnosed with leukemia at age 66. The patient also has one niece diagnosed with breast cancer in her early 46s   GYNECOLOGIC HISTORY:  No LMP recorded. Patient is postmenopausal.  Menarche age 27, first live birth age 45, the patient is [place] P2, she stopped having periods about 20 years ago, didn't take hormone replacement for many years, stopping approximately 2005.   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 Her husband Imogene Gravelle (goes by "[name]") was in [employer], worked in the police department until he was run over by car, then worked for the [employer] on [employer] side, but is now retired. They have been married 42 years as of 2018. Erial has a daughter, Darden Palmer, from an earlier marriage. She lives in [place] and works as a Marine scientist. Millenia and [name] share a son, Yachet Mattson, who lives in [place], and works for [employer]. The patient has 2 grandchildren. She attends a [employer].    ADVANCED DIRECTIVES: In place   HEALTH MAINTENANCE: Social History   Tobacco Use   Smoking status: Never   Smokeless tobacco: Never  Vaping Use   Vaping Use: Never used  Substance Use Topics    Alcohol use: Yes    Comment: socially   Drug use: Never     Colonoscopy:2012, in   PAP: 2012   Bone density: 2015: Normal   No Known Allergies  Current Outpatient Medications  Medication Sig Dispense Refill   ALPRAZolam (XANAX) 1 MG tablet Take 1.5 mg by mouth at bedtime.     amiodarone (PACERONE) 200 MG tablet TAKE 1 TABLET BY MOUTH MONDAY - FRIDAY 65 tablet 5   anastrozole (ARIMIDEX) 1 MG tablet TAKE 1 TABLET BY MOUTH EVERY DAY 90 tablet 4   apixaban (ELIQUIS) 5 MG TABS tablet Take 1 tablet (5 mg total) by mouth 2 (two) times daily. 60 tablet 5   Cholecalciferol (VITAMIN D) 50 MCG (2000 UT) CAPS Take 2,000 Units by mouth daily.     Choline Fenofibrate (FENOFIBRIC ACID) 135 MG CPDR Take 135 mg by mouth daily.      ferrous sulfate 325 (65 FE) MG tablet Take 325 mg by mouth daily with breakfast.      imipramine (TOFRANIL) 50 MG tablet Take 50 mg by mouth at bedtime.     levothyroxine (SYNTHROID, LEVOTHROID) 112 MCG tablet Take 112 mcg by mouth at bedtime.      metFORMIN (GLUCOPHAGE) 500 MG tablet Take 500 mg by mouth 2 (two) times daily.      oxyCODONE (OXY IR/ROXICODONE) 5 MG immediate release tablet Take 1 tablet (5 mg total) by mouth every 6 (six) hours as needed for severe pain. (Patient not taking: Reported on 07/30/2020) 25 tablet 0   psyllium (REGULOID) 0.52 g capsule Take 4.16 g by mouth daily.     traZODone (DESYREL) 150 MG tablet Take 0.5 tablets (75 mg total) by mouth at bedtime. (Patient not taking: Reported on 07/30/2020)     triamterene-hydrochlorothiazide (MAXZIDE-25) 37.5-25 MG tablet Take 1 tablet by mouth daily.     No current facility-administered medications for this visit.    OBJECTIVE: White woman who appears stated age  There were no vitals filed for this visit.    There is no height or weight on file to calculate BMI.    ECOG FS:0 - Asymptomatic    LAB RESULTS:  CMP     Component Value Date/Time   NA 131 (L) 03/29/2020 0650   NA  128 (L) 05/27/2019 1151   NA 141 03/19/2016 1216   K 3.7 03/29/2020 0650   K 3.5 03/19/2016 1216   CL 95 (L) 03/29/2020 0650   CO2 29 03/29/2020 0650   CO2 29 03/19/2016 1216   GLUCOSE 100 (H) 03/29/2020 0650   GLUCOSE 109 03/19/2016 1216   BUN 17 03/29/2020 0650  BUN 15 05/27/2019 1151   BUN 19.2 03/19/2016 1216   CREATININE 1.00 03/29/2020 0650   CREATININE 0.89 07/07/2017 1333   CREATININE 1.4 (H) 03/19/2016 1216   CALCIUM 9.3 03/29/2020 0650   CALCIUM 9.9 03/19/2016 1216   PROT 6.8 03/29/2020 0650   PROT 7.0 07/19/2019 1423   PROT 7.2 03/19/2016 1216   ALBUMIN 3.5 03/29/2020 0650   ALBUMIN 4.2 07/19/2019 1423   ALBUMIN 3.7 03/19/2016 1216   AST 23 03/29/2020 0650   AST 16 07/07/2017 1333   AST 15 03/19/2016 1216   ALT 18 03/29/2020 0650   ALT 11 07/07/2017 1333   ALT 10 03/19/2016 1216   ALKPHOS 39 03/29/2020 0650   ALKPHOS 45 03/19/2016 1216   BILITOT 0.4 03/29/2020 0650   BILITOT 0.3 07/19/2019 1423   BILITOT 0.4 07/07/2017 1333   BILITOT 0.42 03/19/2016 1216   GFRNONAA 58 (L) 03/29/2020 0650   GFRNONAA >60 07/07/2017 1333   GFRAA 49 (L) 09/22/2019 1458   GFRAA >60 07/07/2017 1333    INo results found for: SPEP, UPEP  Lab Results  Component Value Date   WBC 6.2 07/27/2020   NEUTROABS 4.2 07/27/2020   HGB 12.8 07/27/2020   HCT 40.7 07/27/2020   MCV 93 07/27/2020   PLT 391 07/27/2020      Chemistry      Component Value Date/Time   NA 131 (L) 03/29/2020 0650   NA 128 (L) 05/27/2019 1151   NA 141 03/19/2016 1216   K 3.7 03/29/2020 0650   K 3.5 03/19/2016 1216   CL 95 (L) 03/29/2020 0650   CO2 29 03/29/2020 0650   CO2 29 03/19/2016 1216   BUN 17 03/29/2020 0650   BUN 15 05/27/2019 1151   BUN 19.2 03/19/2016 1216   CREATININE 1.00 03/29/2020 0650   CREATININE 0.89 07/07/2017 1333   CREATININE 1.4 (H) 03/19/2016 1216      Component Value Date/Time   CALCIUM 9.3 03/29/2020 0650   CALCIUM 9.9 03/19/2016 1216   ALKPHOS 39 03/29/2020 0650    ALKPHOS 45 03/19/2016 1216   AST 23 03/29/2020 0650   AST 16 07/07/2017 1333   AST 15 03/19/2016 1216   ALT 18 03/29/2020 0650   ALT 11 07/07/2017 1333   ALT 10 03/19/2016 1216   BILITOT 0.4 03/29/2020 0650   BILITOT 0.3 07/19/2019 1423   BILITOT 0.4 07/07/2017 1333   BILITOT 0.42 03/19/2016 1216       No results found for: LABCA2  No components found for: LABCA125  No results for input(s): INR in the last 168 hours.  Urinalysis    Component Value Date/Time   COLORURINE YELLOW 11/17/2019 1856   APPEARANCEUR HAZY (A) 11/17/2019 1856   LABSPEC 1.009 11/17/2019 1856   PHURINE 5.0 11/17/2019 1856   GLUCOSEU NEGATIVE 11/17/2019 1856   HGBUR SMALL (A) 11/17/2019 1856   BILIRUBINUR NEGATIVE 11/17/2019 1856   KETONESUR NEGATIVE 11/17/2019 1856   PROTEINUR NEGATIVE 11/17/2019 1856   NITRITE POSITIVE (A) 11/17/2019 1856   LEUKOCYTESUR LARGE (A) 11/17/2019 1856    STUDIES: DG Chest 2 View  Result Date: 11/24/2020 CLINICAL DATA:  75 year old female with a history of left renal pelvic neoplasm EXAM: CHEST - 2 VIEW COMPARISON:  12/10/2018 FINDINGS: Cardiomediastinal silhouette unchanged in size and contour. No evidence of central vascular congestion. No interlobular septal thickening. Interval placement of cardiac event recorder No pneumothorax or pleural effusion. Coarsened interstitial markings, with no confluent airspace disease. No acute displaced fracture. Degenerative changes of the  spine. Surgical changes of the right chest wall IMPRESSION: Negative for acute cardiopulmonary disease. Interval placement of cardiac event recorder Electronically Signed   By: Corrie Mckusick D.O.   On: 11/24/2020 12:58   CUP PACEART REMOTE DEVICE CHECK  Result Date: 12/18/2020 ILR summary report received. Battery status OK. Normal device function. No new symptom, tachy, brady, or pause episodes. No new AF episodes. Monthly summary reports and ROV/PRN LH     ELIGIBLE FOR AVAILABLE RESEARCH  PROTOCOL: *no  ASSESSMENT: 75 y.o. [place] woman status post right breast upper inner quadrant biopsy 03/10/2016 for a clinical T1b N0, stage IA invasive ductal carcinoma, grade 1, estrogen and progesterone receptor positive, HER-2 nonamplified, with an MIB-1 of 10%  (1)Status post right lumpectomy and sentinel lymph node sampling 04/01/2016 for a pT1b pN0, stage IA invasive ductal carcinoma, grade 1, with negative margins  (2) given the patient's excellent anatomic prognosis and age, no Oncotype and no chemotherapy is planned  (3) adjuvant radiation 05/22/2016 to 06/20/2016  1. The Right breast was treated to 42.5 Gy in 17 fractions in 2.5 Gy per fraction. 2. The Right breast was boosted to 7.5 Gy in 3 fractions at 2.5 Gy per fraction.    (4) anastrozole started 07/15/2016  (a) bone density July 2019 normal  (5) Genetics testing 06/23/2016 through [facility]'s Common Hereditary Cancers Panel found no deleterious mutations in APC, ATM, AXIN2, BARD1, BMPR1A, BRCA1, BRCA2, BRIP1, CDH1, CDKN2A, CHEK2, CTNNA1, DICER1, EPCAM, GREM1, HOXB13, KIT, MEN1, MLH1, MSH2, MSH3, MSH6, MUTYH, NBN, NF1, NTHL1, PALB2, PDGFRA, PMS2, POLD1, POLE, PTEN, RAD50, RAD51C, RAD51D, SDHA, SDHB, SDHC, SDHD, SMAD4, SMARCA4, STK11, TP53, TSC1, TSC2, and VHL.  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  Follow-up letter has been sent   Kacyn Souder, [name], MD  12/20/20 5:05 PM Medical Oncology and Hematology [facility] [place],  [address] Tel. [phone]    Fax. [phone]   I, Wilburn Mylar, am acting as scribe for Dr. [name]. Jonas Goh.  I, Lurline Del MD, have reviewed the above documentation for accuracy and completeness, and I agree with the above.   *Total Encounter Time as defined by the  Centers for Medicare and Medicaid Services includes, in addition to the face-to-face time of a patient visit (documented in the note above) non-face-to-face time: obtaining and reviewing outside history, ordering and reviewing medications, tests or procedures, care coordination (communications with other health care professionals or caregivers) and documentation in the medical record.

## 2020-12-27 NOTE — Progress Notes (Signed)
[employer] Summary Report / Loop Recorder 

## 2021-01-18 ENCOUNTER — Other Ambulatory Visit: Payer: Self-pay | Admitting: Internal Medicine

## 2021-01-18 DIAGNOSIS — I4891 Unspecified atrial fibrillation: Secondary | ICD-10-CM

## 2021-01-18 NOTE — Telephone Encounter (Signed)
Eliquis 5 mg refill request received. Patient is 76 years old, weight- 68 kg, Crea- 1.0 on 03/29/20, Diagnosis- afib, and last seen by Dr. Caryl Comes on 07/30/20. Dose is appropriate based on dosing criteria. Will send in refill to requested pharmacy.

## 2021-01-21 ENCOUNTER — Ambulatory Visit (INDEPENDENT_AMBULATORY_CARE_PROVIDER_SITE_OTHER): Payer: Medicare PPO

## 2021-01-21 DIAGNOSIS — R55 Syncope and collapse: Secondary | ICD-10-CM | POA: Diagnosis not present

## 2021-01-21 LAB — CUP PACEART REMOTE DEVICE CHECK
Date Time Interrogation Session: 20230115231202
Implantable Pulse Generator Implant Date: 20210521

## 2021-02-01 ENCOUNTER — Encounter: Payer: Medicare PPO | Admitting: Internal Medicine

## 2021-02-01 NOTE — Progress Notes (Signed)
[employer] Summary Report / Loop Recorder 

## 2021-02-02 IMAGING — CR DG HIP (WITH OR WITHOUT PELVIS) 2-3V*R*
3 series · 3 of 3 positions shown · non-contrast
Comparison: November 03, 2014

CLINICAL DATA: Pain

EXAM:
DG HIP (WITH OR WITHOUT PELVIS) 2-3V RIGHT

[t pelvis a.p.]
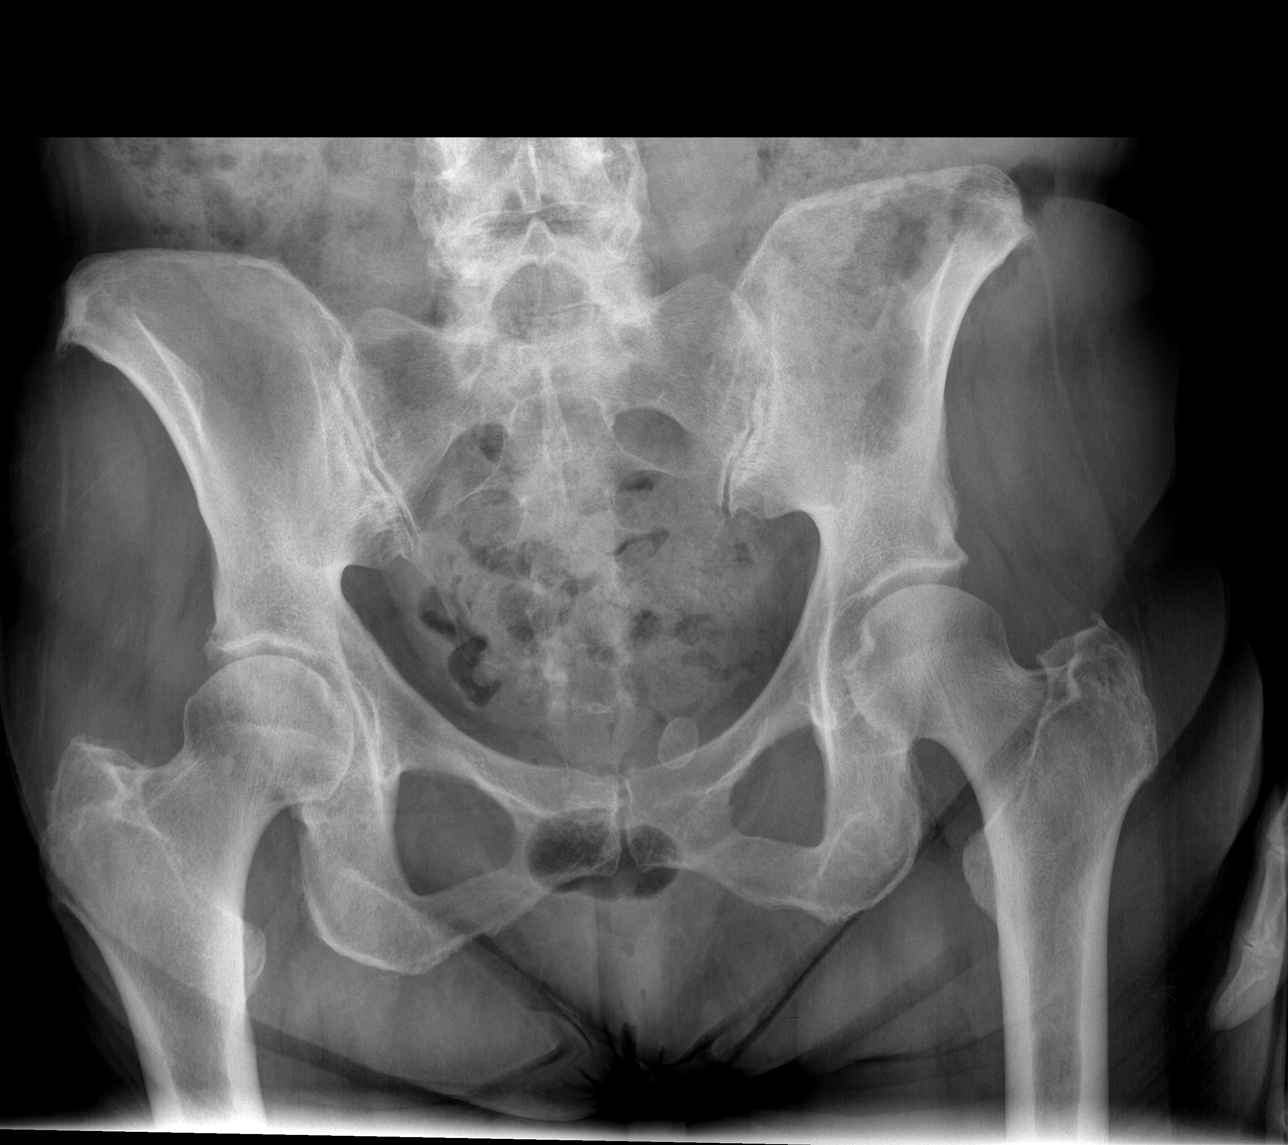

[t hip ap right]
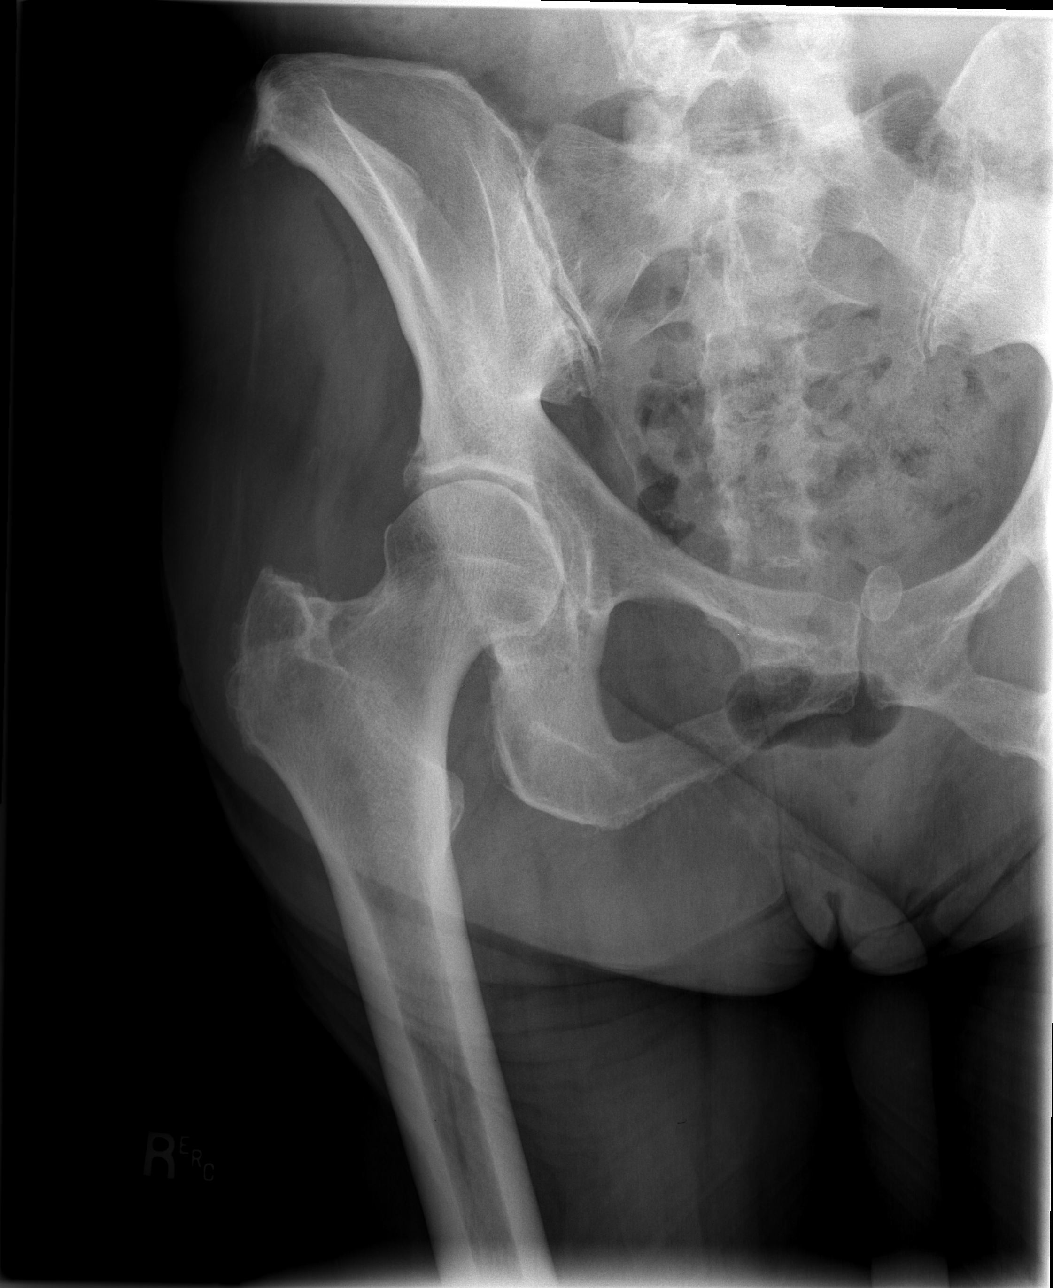

[t hip frog leg right]
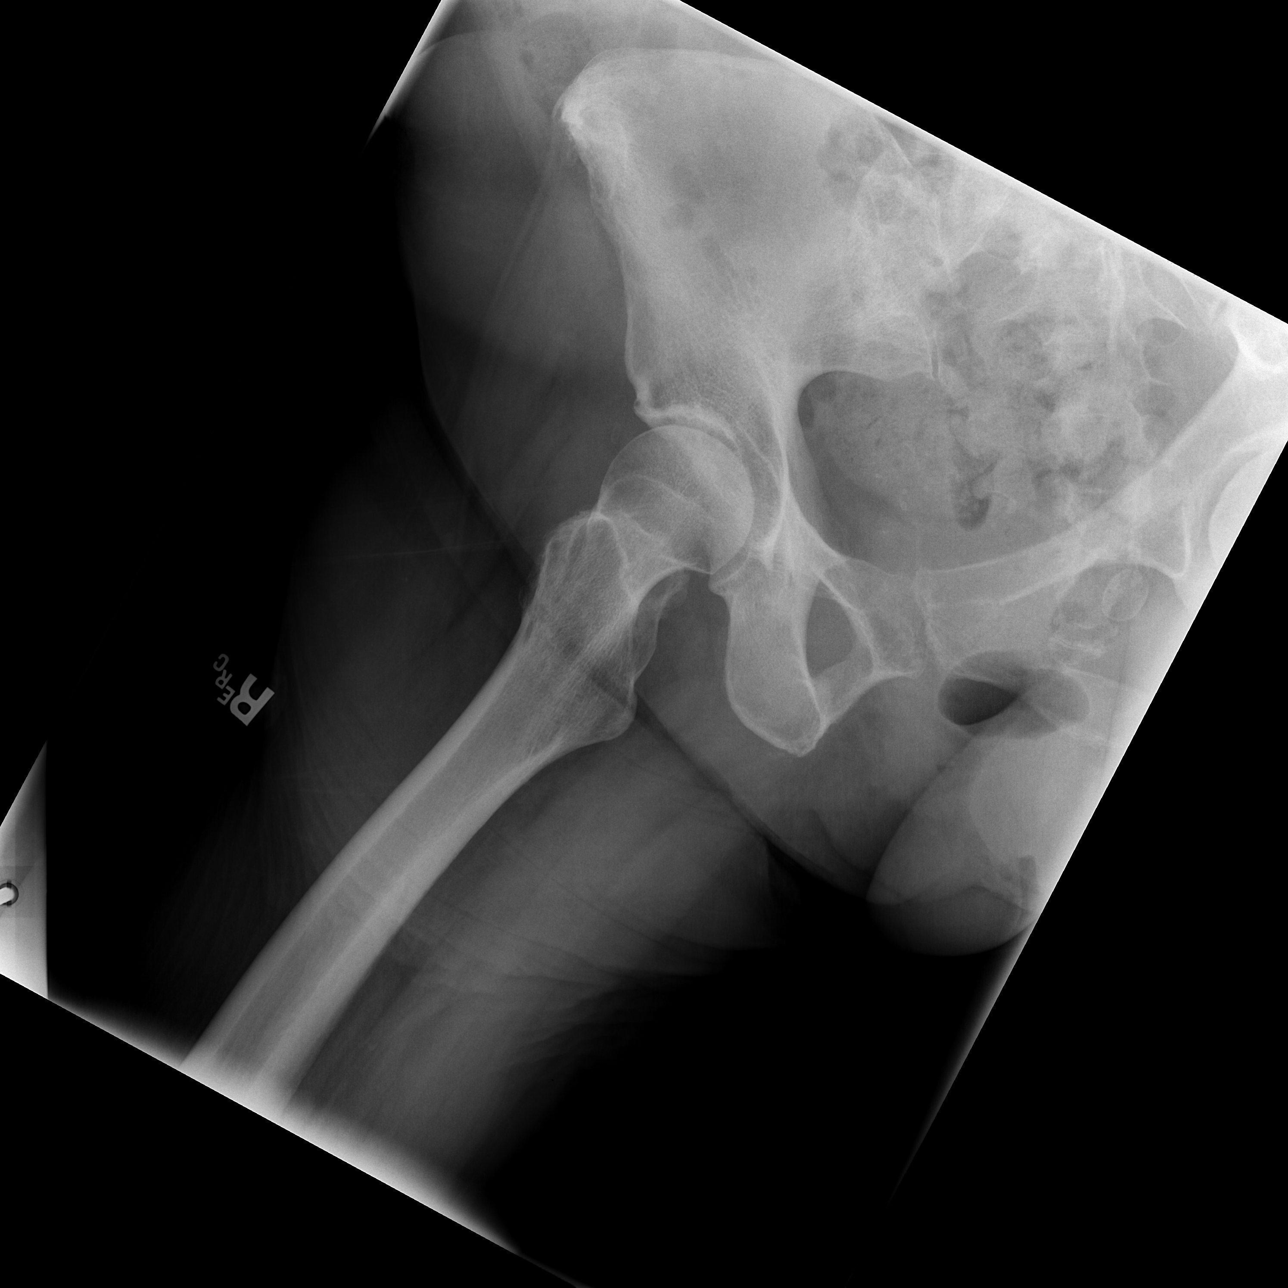

[3 of 3 positions shown; findings below may reference images not displayed]

FINDINGS: Frontal pelvis as well as frontal and right hip images were
obtained. There is no fracture or dislocation. There is mild
narrowing of each hip joint, slightly more notable on the right than
on the left. Sacroiliac joints appear unremarkable bilaterally. No
erosive change.
IMPRESSION: Osteoarthritic change in each hip joint, slightly more severe on the
right than the left. No fracture or dislocation.

## 2021-02-05 ENCOUNTER — Encounter: Payer: Medicare PPO | Admitting: Internal Medicine

## 2021-02-05 DIAGNOSIS — F331 Major depressive disorder, recurrent, moderate: Secondary | ICD-10-CM | POA: Diagnosis not present

## 2021-02-05 DIAGNOSIS — F419 Anxiety disorder, unspecified: Secondary | ICD-10-CM | POA: Diagnosis not present

## 2021-02-05 DIAGNOSIS — G4700 Insomnia, unspecified: Secondary | ICD-10-CM | POA: Diagnosis not present

## 2021-02-14 DIAGNOSIS — F419 Anxiety disorder, unspecified: Secondary | ICD-10-CM | POA: Diagnosis not present

## 2021-02-14 DIAGNOSIS — G4700 Insomnia, unspecified: Secondary | ICD-10-CM | POA: Diagnosis not present

## 2021-02-14 DIAGNOSIS — E2839 Other primary ovarian failure: Secondary | ICD-10-CM | POA: Diagnosis not present

## 2021-02-23 LAB — CUP PACEART REMOTE DEVICE CHECK
Date Time Interrogation Session: 20230217231210
Implantable Pulse Generator Implant Date: 20210521

## 2021-02-25 ENCOUNTER — Ambulatory Visit (INDEPENDENT_AMBULATORY_CARE_PROVIDER_SITE_OTHER): Payer: Medicare PPO

## 2021-02-25 DIAGNOSIS — R55 Syncope and collapse: Secondary | ICD-10-CM | POA: Diagnosis not present

## 2021-02-26 ENCOUNTER — Encounter: Payer: Self-pay | Admitting: Internal Medicine

## 2021-02-26 ENCOUNTER — Ambulatory Visit: Payer: Medicare PPO | Admitting: Internal Medicine

## 2021-02-26 ENCOUNTER — Other Ambulatory Visit: Payer: Self-pay

## 2021-02-26 VITALS — BP 146/84 | HR 64 | Ht 65.5 in | Wt 161.0 lb

## 2021-02-26 DIAGNOSIS — R000 Tachycardia, unspecified: Secondary | ICD-10-CM | POA: Diagnosis not present

## 2021-02-26 DIAGNOSIS — I4891 Unspecified atrial fibrillation: Secondary | ICD-10-CM

## 2021-02-26 DIAGNOSIS — Z79899 Other long term (current) drug therapy: Secondary | ICD-10-CM

## 2021-02-26 MED ORDER — AMIODARONE HCL 200 MG PO TABS: 100.0000 mg | ORAL_TABLET | Freq: Every day | ORAL | 1 refills | Status: DC

## 2021-02-26 NOTE — Progress Notes (Signed)
Patient Care Team: [name], MD as PCP - General (Family Medicine) Elouise Munroe, MD as PCP - Cardiology (Cardiology) Fanny Skates, MD as Consulting Physician (General Surgery) Magrinat, [name], MD (Inactive) as Consulting Physician (Oncology) Kyung Rudd, MD as Consulting Physician (Radiation Oncology) Delice Bison, Charlestine Massed, NP as Nurse Practitioner (Hematology and Oncology) Alexis Frock, MD as Consulting Physician (Urology) Suella Broad, MD as Consulting Physician (Physical Medicine and Rehabilitation) Ria Clock, MD as Attending Physician (Radiology)   HPI  Cynthia [name] is a 76 y.o. female Seen in followup for syncope w hx of WCT and known afib rx w Amio; on anticoagulation with on Apixoban    She has a history of near syncope in the context of wide-complex tachycardia.     Cardiac MRI suggested left ventricular noncompaction  Some orthostatic hypotension and recurrent syncope 5/21  Patient denies symptoms of GI intolerance, sun sensitivity, neurological symptoms attributable to amiodarone.  The patient denies chest pain, nocturnal dyspnea, orthopnea or peripheral edema.  There have been no palpitations or syncope.  Complains of some dyspnea on exertion.  Stable over months.  Also has periodic lightheadedness without presyncope.  Checks her blood pressure periodically sometimes he finds it quite high.    DATE TEST EF   12/20 Echo   50-55 %   12/20 cMRI  *52* %         Date Cr K Hgb PLT  TSH LFTs  12/20      2.85 21  5/21 1.11 3.7 11.9<<13.1       6/21 1.17 3.8   0.654   3/22 1.00 3.7 12.8 391   18     Records and Results Reviewed   Past Medical History:  Diagnosis Date   AICD (automatic cardioverter/defibrillator) present    Medtronic   Anticoagulant long-term use    eliquis--- managed by cardiology   Anxiety    Arthritis    Bradycardia    Cancer of kidney ([place])    DDD (degenerative disc disease), lumbar    Depression     Dyspnea    ON EXERTION   First degree heart block    Genetic testing 06/25/2016   Ms. Gisler underwent genetic counseling and testing for hereditary cancer syndromes on 06/17/2016. Her results were negative for mutations in all 46 genes analyzed by [facility]'s 46-gene Common Hereditary Cancers Panel. Genes analyzed include: APC, ATM, AXIN2, BARD1, BMPR1A, BRCA1, BRCA2, BRIP1, CDH1, CDKN2A, CHEK2, CTNNA1, DICER1, EPCAM, GREM1, HOXB13, KIT, MEN1, MLH1, MSH2, MSH3, MSH6, MUTYH, NBN,   GERD (gastroesophageal reflux disease)    occasional,  takes pepcid   Hematuria    History of radiation therapy 05/22/2016 to 06/20/2016   right breast cancer   Hypertension    followd by pcp---  (02-22-2019 per pt never had stress test)   Hypothyroidism    followed by pcp   IBS (irritable bowel syndrome)    Incomplete right bundle branch block    Insomnia    Malignant neoplasm of upper-inner quadrant of right breast in female, estrogen receptor positive [facility]) oncologist--- dr [name]   dx 03/ 2018,  Stage IA, IDC, ER/PR +;  04-01-2016 s/p  right breast lumpectomy w/ node dissection's;  completed radiation 06-20-2016   MVP (mitral valve prolapse)    per echo 12-11-2018 in epic, mild    NSVT (nonsustained ventricular tachycardia) followed by cardiology   12-10-2018  hospital admission ,  refer to discharge note 12-14-2018 for treatement   PAF (  paroxysmal atrial fibrillation) [facility]) primary cardiologist--- dr Margaretann Loveless   newly dx 12-10-2018  admission in epic, Afib w/ RVR and NSVT;   in epic TTE 12-11-2018 showed ef 50-55%, mild MVP,  mild AV sclerosis without stenosis;   Cardiac MRI 12-13-2018 in epic   Renal mass, left    pelvis    Restless leg syndrome    occasional   Type 2 diabetes mellitus ([place])    followed by pcp---  (02-22-2019 does not check blood sugar's)    Past Surgical History:  Procedure Laterality Date   BREAST LUMPECTOMY WITH RADIOACTIVE SEED AND SENTINEL LYMPH NODE BIOPSY Right 04/01/2016    Procedure: RADIOACTIVE SEED GUIDED RIGHT BREAST LUMPECTOMY WITH RIGHT AXILLARY SENTINEL LYMPH NODE BIOPSY.;  Surgeon: Fanny Skates, MD;  Location: [place];  Service: General;  Laterality: Right;   CARDIAC CATHETERIZATION  12/11/2018   CATARACT EXTRACTION W/ INTRAOCULAR LENS  IMPLANT, BILATERAL  1980s   COLONOSCOPY     CYSTOSCOPY/RETROGRADE/URETEROSCOPY N/A 03/02/2019   Procedure: CYSTOSCOPY/LEFT RETROGRADE/URETEROSCOPY/  BIOPSY/  STENT;  Surgeon: Ceasar Mons, MD;  [facility];  Service: Urology;  Laterality: N/A;   INCISIONAL HERNIA REPAIR N/A 03/29/2020   Procedure: REPAIR OF INCISIONAL HERNIA WITH MESH;  Surgeon: Erroll Luna, MD;  Location: [place];  Service: General;  Laterality: N/A;   INSERTION OF ICD     [place] LAPAROSCOPIC NEPHROURETERECTOMY Left 04/27/2019   Procedure: XI ROBOT ASSITED LAPAROSCOPIC NEPHROURETERECTOMY;  Surgeon: Alexis Frock, MD;  [facility];  Service: Urology;  Laterality: Left;  3.5 HRS   TONSILLECTOMY  age 10    Current Meds  Medication Sig   ALPRAZolam (XANAX) 1 MG tablet Take 1.5 mg by mouth at bedtime.   anastrozole (ARIMIDEX) 1 MG tablet TAKE 1 TABLET BY MOUTH EVERY DAY   apixaban (ELIQUIS) 5 MG TABS tablet TAKE 1 TABLET BY MOUTH TWICE A DAY   Cholecalciferol (VITAMIN D) 50 MCG (2000 UT) CAPS Take 2,000 Units by mouth daily.   [place]oline Fenofibrate (FENOFIBRIC ACID) 135 MG CPDR Take 135 mg by mouth daily.    ferrous sulfate 325 (65 FE) MG tablet Take 325 mg by mouth daily with breakfast.    levothyroxine (SYNTHROID, LEVOTHROID) 112 MCG tablet Take 112 mcg by mouth at bedtime.    mirtazapine (REMERON) 15 MG tablet Take 1 tablet by mouth at bedtime.   psyllium (REGULOID) 0.52 g capsule Take 4.16 g by mouth daily.   [DISCONTINUED] amiodarone (PACERONE) 200 MG tablet TAKE 1 TABLET BY MOUTH MONDAY - FRIDAY    No Known Allergies    Review of Systems negative  except from HPI and PMH  Physical Exam BP (!) 146/84    Pulse 64    Ht 5' 5.5" (1.664 m)    Wt 161 lb (73 kg)    SpO2 98%    BMI 26.38 kg/m  Well developed and nourished in no acute distress HENT normal Neck supple with JVP-  flat   Clear Regular rate and rhythm, no murmurs or gallops Abd-soft with active BS No Clubbing cyanosis edema Skin-warm and dry A & Oriented  Grossly normal sensory and motor function ECG sinus @ 63  21/09/46  Assessment and  Plan Atrial fibrillation with a rapid rate   Presyncope/syncope -probably orthostatic   Atrial fibrillation pauses/posttermination pauses   Wide-complex tachycardia-regular >> fasicular VT   Hypertension  Dysgraphia  Psychosocial stress-husband with brain cancer  No interval syncope,  but orthostatic hypotension  No bleeding continue Apixaban  5 bid  Holding sinus continue amio but decrease from 200>>100 daily; check surveillance labs  With orthostatic LH and BP  will allow systolic BP to run a little higher  150s are acceptable  Asked about support and gave her name of G Plovsky

## 2021-02-26 NOTE — Patient Instructions (Addendum)
Medication Instructions:  Your physician has recommended you make the following change in your medication:   Decrease your Amiodarone 200mg  to 100mg  (1/2 tablet) by mouth daily  *If you need a refill on your cardiac medications before your next appointment, please call your pharmacy*   Lab Work: TSH and Liver panel  If you have labs (blood work) drawn today and your tests are completely normal, you will receive your results only by: MyChart Message (if you have MyChart) OR A paper copy in the mail If you have any lab test that is abnormal or we need to change your treatment, we will call you to review the results.   Testing/Procedures: None ordered.    Follow-Up: At [facility], you and your health needs are our priority.  As part of our continuing mission to provide you with exceptional heart care, we have created designated Provider Care Teams.  These Care Teams include your primary Cardiologist (physician) and Advanced Practice Providers (APPs -  Physician Assistants and Nurse Practitioners) who all work together to provide you with the care you need, when you need it.  We recommend signing up for the patient portal called "MyChart".  Sign up information is provided on this After Visit Summary.  MyChart is used to connect with patients for Virtual Visits (Telemedicine).  Patients are able to view lab/test results, encounter notes, upcoming appointments, etc.  Non-urgent messages can be sent to your provider as well.   To learn more about what you can do with MyChart, go to [email or web address].    Your next appointment:   6 months with Dr Caryl Comes

## 2021-02-28 LAB — HEPATIC FUNCTION PANEL
ALT: 10 IU/L (ref 0–32)
AST: 20 IU/L (ref 0–40)
Albumin: 4.4 g/dL (ref 3.7–4.7)
Alkaline Phosphatase: 66 IU/L (ref 44–121)
Bilirubin Total: 0.3 mg/dL (ref 0.0–1.2)
Bilirubin, Direct: 0.12 mg/dL (ref 0.00–0.40)
Total Protein: 7.4 g/dL (ref 6.0–8.5)

## 2021-02-28 LAB — TSH: TSH: 2.53 u[IU]/mL (ref 0.450–4.500)

## 2021-03-04 NOTE — Progress Notes (Signed)
[employer] Summary Report / Loop Recorder 

## 2021-03-23 ENCOUNTER — Emergency Department (HOSPITAL_BASED_OUTPATIENT_CLINIC_OR_DEPARTMENT_OTHER)
Admission: EM | Admit: 2021-03-23 | Discharge: 2021-03-23 | Disposition: A | Discharge status: Home / Self Care | Payer: Medicare PPO | Attending: Emergency Medicine | Admitting: Emergency Medicine

## 2021-03-23 ENCOUNTER — Emergency Department (HOSPITAL_BASED_OUTPATIENT_CLINIC_OR_DEPARTMENT_OTHER): Payer: Medicare PPO | Admitting: Radiology

## 2021-03-23 ENCOUNTER — Other Ambulatory Visit: Payer: Self-pay

## 2021-03-23 ENCOUNTER — Encounter (HOSPITAL_BASED_OUTPATIENT_CLINIC_OR_DEPARTMENT_OTHER): Payer: Self-pay | Admitting: Pediatrics

## 2021-03-23 DIAGNOSIS — Z853 Personal history of malignant neoplasm of breast: Secondary | ICD-10-CM | POA: Insufficient documentation

## 2021-03-23 DIAGNOSIS — Z7901 Long term (current) use of anticoagulants: Secondary | ICD-10-CM | POA: Diagnosis not present

## 2021-03-23 DIAGNOSIS — S39012A Strain of muscle, fascia and tendon of lower back, initial encounter: Secondary | ICD-10-CM

## 2021-03-23 DIAGNOSIS — N390 Urinary tract infection, site not specified: Secondary | ICD-10-CM | POA: Diagnosis not present

## 2021-03-23 DIAGNOSIS — I10 Essential (primary) hypertension: Secondary | ICD-10-CM | POA: Diagnosis not present

## 2021-03-23 DIAGNOSIS — R0600 Dyspnea, unspecified: Secondary | ICD-10-CM | POA: Insufficient documentation

## 2021-03-23 DIAGNOSIS — X58XXXA Exposure to other specified factors, initial encounter: Secondary | ICD-10-CM | POA: Diagnosis not present

## 2021-03-23 DIAGNOSIS — Z79899 Other long term (current) drug therapy: Secondary | ICD-10-CM | POA: Insufficient documentation

## 2021-03-23 DIAGNOSIS — Z85528 Personal history of other malignant neoplasm of kidney: Secondary | ICD-10-CM | POA: Diagnosis not present

## 2021-03-23 DIAGNOSIS — E039 Hypothyroidism, unspecified: Secondary | ICD-10-CM | POA: Insufficient documentation

## 2021-03-23 DIAGNOSIS — R0609 Other forms of dyspnea: Secondary | ICD-10-CM

## 2021-03-23 DIAGNOSIS — E119 Type 2 diabetes mellitus without complications: Secondary | ICD-10-CM | POA: Diagnosis not present

## 2021-03-23 DIAGNOSIS — M545 Low back pain, unspecified: Secondary | ICD-10-CM | POA: Diagnosis not present

## 2021-03-23 DIAGNOSIS — S3992XA Unspecified injury of lower back, initial encounter: Secondary | ICD-10-CM | POA: Diagnosis present

## 2021-03-23 DIAGNOSIS — R0602 Shortness of breath: Secondary | ICD-10-CM | POA: Diagnosis not present

## 2021-03-23 LAB — CBC WITH DIFFERENTIAL/PLATELET
Abs Immature Granulocytes: 0 10*3/uL (ref 0.00–0.07)
Basophils Absolute: 0 10*3/uL (ref 0.0–0.1)
Basophils Relative: 1 %
Eosinophils Absolute: 0.4 10*3/uL (ref 0.0–0.5)
Eosinophils Relative: 6 %
HCT: 37.9 % (ref 36.0–46.0)
Hemoglobin: 12.1 g/dL (ref 12.0–15.0)
Immature Granulocytes: 0 %
Lymphocytes Relative: 33 %
Lymphs Abs: 2.1 10*3/uL (ref 0.7–4.0)
MCH: 30.6 pg (ref 26.0–34.0)
MCHC: 31.9 g/dL (ref 30.0–36.0)
MCV: 95.7 fL (ref 80.0–100.0)
Monocytes Absolute: 0.6 10*3/uL (ref 0.1–1.0)
Monocytes Relative: 10 %
Neutro Abs: 3.2 10*3/uL (ref 1.7–7.7)
Neutrophils Relative %: 50 %
Platelets: 292 10*3/uL (ref 150–400)
RBC: 3.96 MIL/uL (ref 3.87–5.11)
RDW: 13.7 % (ref 11.5–15.5)
WBC: 6.2 10*3/uL (ref 4.0–10.5)
nRBC: 0 % (ref 0.0–0.2)

## 2021-03-23 LAB — COMPREHENSIVE METABOLIC PANEL
ALT: 10 U/L (ref 0–44)
AST: 18 U/L (ref 15–41)
Albumin: 3.8 g/dL (ref 3.5–5.0)
Alkaline Phosphatase: 42 U/L (ref 38–126)
Anion gap: 7 (ref 5–15)
BUN: 21 mg/dL (ref 8–23)
CO2: 28 mmol/L (ref 22–32)
Calcium: 9.5 mg/dL (ref 8.9–10.3)
Chloride: 103 mmol/L (ref 98–111)
Creatinine, Ser: 1.14 mg/dL — ABNORMAL HIGH (ref 0.44–1.00)
GFR, Estimated: 49 mL/min — ABNORMAL LOW (ref >60)
Glucose, Bld: 88 mg/dL (ref 70–99)
Potassium: 3.9 mmol/L (ref 3.5–5.1)
Sodium: 138 mmol/L (ref 135–145)
Total Bilirubin: 0.4 mg/dL (ref 0.3–1.2)
Total Protein: 7 g/dL (ref 6.5–8.1)

## 2021-03-23 LAB — URINALYSIS, ROUTINE W REFLEX MICROSCOPIC
Bilirubin Urine: NEGATIVE
Glucose, UA: NEGATIVE mg/dL
Hgb urine dipstick: NEGATIVE
Ketones, ur: NEGATIVE mg/dL
Nitrite: NEGATIVE
Protein, ur: NEGATIVE mg/dL
Specific Gravity, Urine: 1.014 (ref 1.005–1.030)
pH: 6.5 (ref 5.0–8.0)

## 2021-03-23 LAB — D-DIMER, QUANTITATIVE: D-Dimer, Quant: 0.34 ug/mL-FEU (ref 0.00–0.50)

## 2021-03-23 LAB — LIPASE, BLOOD: Lipase: 12 U/L (ref 11–51)

## 2021-03-23 LAB — TROPONIN I (HIGH SENSITIVITY)
Troponin I (High Sensitivity): 6 ng/L (ref <18)
Troponin I (High Sensitivity): 6 ng/L (ref <18)

## 2021-03-23 MED ORDER — SODIUM CHLORIDE 0.9 % IV SOLN
1.0000 g | Freq: Once | INTRAVENOUS | Status: AC
  Administered 2021-03-23: 1 g via INTRAVENOUS
  Filled 2021-03-23: qty 10

## 2021-03-23 MED ORDER — ALUM & MAG HYDROXIDE-SIMETH 200-200-20 MG/5ML PO SUSP
30.0000 mL | Freq: Once | ORAL | Status: AC
  Administered 2021-03-23: 30 mL via ORAL
  Filled 2021-03-23: qty 30

## 2021-03-23 MED ORDER — LIDOCAINE 5 % EX PTCH
1.0000 | MEDICATED_PATCH | CUTANEOUS | Status: DC
  Administered 2021-03-23: 1 via TRANSDERMAL
  Filled 2021-03-23: qty 1

## 2021-03-23 MED ORDER — LIDOCAINE VISCOUS HCL 2 % MT SOLN
15.0000 mL | Freq: Once | OROMUCOSAL | Status: AC
Start: 2021-03-23 — End: 2021-03-23
  Administered 2021-03-23: 15 mL via ORAL
  Filled 2021-03-23: qty 15

## 2021-03-23 MED ORDER — LIDOCAINE 5 % EX PTCH: 1.0000 | MEDICATED_PATCH | Freq: Every day | CUTANEOUS | 0 refills | Status: DC | PRN

## 2021-03-23 MED ORDER — CEPHALEXIN 500 MG PO CAPS: 500.0000 mg | ORAL_CAPSULE | Freq: Two times a day (BID) | ORAL | 0 refills | Status: AC

## 2021-03-23 MED ORDER — ACETAMINOPHEN 325 MG PO TABS
650.0000 mg | ORAL_TABLET | Freq: Once | ORAL | Status: AC
  Administered 2021-03-23: 650 mg via ORAL
  Filled 2021-03-23: qty 2

## 2021-03-23 MED ORDER — IBUPROFEN 800 MG PO TABS
800.0000 mg | ORAL_TABLET | Freq: Once | ORAL | Status: AC
  Administered 2021-03-23: 800 mg via ORAL
  Filled 2021-03-23: qty 1

## 2021-03-23 MED ORDER — ACETAMINOPHEN 325 MG PO TABS: 650.0000 mg | ORAL_TABLET | Freq: Four times a day (QID) | ORAL | 0 refills | Status: DC | PRN

## 2021-03-23 MED ORDER — METHOCARBAMOL 500 MG PO TABS: 500.0000 mg | ORAL_TABLET | Freq: Two times a day (BID) | ORAL | 0 refills | Status: AC

## 2021-03-23 MED ORDER — METHOCARBAMOL 500 MG PO TABS: 1000.0000 mg | ORAL_TABLET | Freq: Two times a day (BID) | ORAL | 0 refills | Status: DC

## 2021-03-23 NOTE — ED Triage Notes (Signed)
C/O of right flank pain x 3 days, started after getting down cleaning the grout. Denies any radiation or any painful urination. Reported hx of 1 kidney,  ?

## 2021-03-23 NOTE — ED Notes (Signed)
Patient given discharge instructions. Questions were answered. Patient verbalized understanding of discharge instructions and care at home.  Discharged with spouse  

## 2021-03-23 NOTE — Discharge Instructions (Addendum)
Please follow-up with your Primary Care Physician within the next week. Please take your medications as instructed and discuss any changes to your medications with your primary care physician.  ?  ?Please return to the Emergency Department if you have any leg numbness, leg weakness, difficulty walking, fevers, worsening of pain, lightheadedness, lose consciousness, severe abdominal pain, severe headache, difficulty urinating, or difficulty having a bowel movement. ?  ?You were prescribed antibiotics. Finish the medication even if you feel better! Drink enough water and fluids to keep your urine clear or pale yellow. Avoid caffeine, tea, and carbonated beverages - these can irritate your bladder. Empty your bladder often. Avoid holding urine for long periods of time. Empty your bladder before and after sexual intercourse. After a bowel movement, women should cleanse from front to back. Use each tissue only once.  ? ?Please return to the emergency department immediately for any new or concerning symptoms, or if you get worse. ?

## 2021-03-23 NOTE — ED Provider Notes (Signed)
?[place] EMERGENCY DEPT ?Provider Note ? ? ?CSN: [id number] ?Arrival date & time: 03/23/21  1036 ? ?  ? ?History ? ?Chief Complaint  ?Patient presents with  ? Flank Pain  ? ? ?Cynthia [name] is a 76 y.o. female. ? ?This is a 76 y.o. female with significant medical history as below, including renal cancer, exertional dyspnea, diabetes, MVP who presents to the ED with complaint of low back pain. ? ?Location: Lumbar right ?Duration: 3 days ?Onset: Sudden ?Timing: Constant ?Description: Sharp, stabbing, aching ?Severity: Mild ?Exacerbating/Alleviating Factors: Worse when bending over or picking up object or twisting torso ?Associated Symptoms: Ongoing exertional dyspnea roughly unchanged from her baseline ?Pertinent Negatives: No fevers, chills, nausea, vomiting, chest pain, rashes, IV drug use, chronic steroid use, recent spinal manipulation or injection ?Context: Patient reports she was cleaning grout in her bathroom, felt a sharp pulling sensation in her low back, nonradiating.  Has been persistent since the onset, did not fall down.  No gait disturbance, no numbness or tingling, no change to bowel or bladder function including urinary or bowel incontinence or overflow.  No saddle paresthesias. ? ?She has ongoing exertional dyspnea patient attributes to chronic back pain ? ? ? ?Past Medical History: ?No date: AICD (automatic cardioverter/defibrillator) present ?    Comment:  Medtronic ?No date: Anticoagulant long-term use ?    Comment:  eliquis--- managed by cardiology ?No date: Anxiety ?No date: Arthritis ?No date: Bradycardia ?No date: Cancer of kidney ([place]) ?No date: DDD (degenerative disc disease), lumbar ?No date: Depression ?No date: Dyspnea ?    Comment:  ON EXERTION ?No date: First degree heart block ?06/25/2016: Genetic testing ?    Comment:  Ms. Bugh underwent genetic counseling and testing for  ?             hereditary cancer syndromes on 06/17/2016. Her results  ?             were negative  for mutations in all 46 genes analyzed by  ?             [employer]'s 46-gene Common Hereditary Cancers Panel. Genes  ?             analyzed include: APC, ATM, AXIN2, BARD1, BMPR1A, BRCA1,  ?             BRCA2, BRIP1, CDH1, CDKN2A, CHEK2, CTNNA1, DICER1, EPCAM, ?             GREM1, HOXB13, KIT, MEN1, MLH1, MSH2, MSH3, MSH6, MUTYH,  ?             NBN, ?No date: GERD (gastroesophageal reflux disease) ?    Comment:  occasional,  takes pepcid ?No date: Hematuria ?05/22/2016 to 06/20/2016: History of radiation therapy ?    Comment:  right breast cancer ?No date: Hypertension ?    Comment:  followd by pcp---  (02-22-2019 per pt never had stress  ?             test) ?No date: Hypothyroidism ?    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    Comment:  dx 03/ 2018,  Stage IA, IDC, ER/PR +;  04-01-2016 s/p   ?             right breast lumpectomy w/ node dissection's;  completed  ?             radiation 06-20-2016 ?No date: MVP (mitral valve prolapse) ?    Comment:  per echo 12-11-2018 in epic, mild  ?followed by cardiology: NSVT (nonsustained ventricular tachycardia) ?    Comment:  12-10-2018  hospital admission ,  refer to discharge  ?             note 12-14-2018 for treatement ?primary cardiologist--- dr Margaretann Loveless: PAF (paroxysmal atrial  ?fibrillation) ([place]) ?    Comment:  newly dx 12-10-2018  admission in epic, Afib w/ RVR and  ?             NSVT;   in epic TTE 12-11-2018 showed ef 50-55%, mild  ?             MVP,  mild AV sclerosis without stenosis;   Cardiac MRI  ?             12-13-2018 in epic ?No date: Renal mass, left ?    Comment:  pelvis  ?No date: Restless leg syndrome ?    Comment:  occasional ?No date: Type 2 diabetes mellitus ([place]) ?    Comment:  followed by pcp---  (02-22-2019 does not check  blood  ?             sugar's) ? ?Past Surgical History: ?04/01/2016: BREAST LUMPECTOMY WITH RADIOACTIVE SEED AND SENTINEL LYMPH  ?NODE BIOPSY; Right ?    Comment:  Procedure: RADIOACTIVE SEED GUIDED RIGHT BREAST  ?             LUMPECTOMY WITH RIGHT AXILLARY SENTINEL LYMPH NODE  ?             BIOPSY.;  Surgeon: Fanny Skates, MD;  Location: [place];  ?             Service: General;  Laterality: Right; ?12/11/2018: CARDIAC CATHETERIZATION ?1980s: CATARACT EXTRACTION W/ INTRAOCULAR LENS  IMPLANT, BILATERAL ?No date: COLONOSCOPY ?03/02/2019: CYSTOSCOPY/RETROGRADE/URETEROSCOPY; N/A ?    Comment:  Procedure: CYSTOSCOPY/LEFT RETROGRADE/URETEROSCOPY/   ?             BIOPSY/  STENT;  Surgeon: Ceasar Mons, MD;  ?             Location: [facility];  Service: Urology;  ?             Laterality: N/A; ?03/29/2020: INCISIONAL HERNIA REPAIR; N/A ?    Comment:  Procedure: REPAIR OF INCISIONAL HERNIA WITH MESH;   ?             Surgeon: Erroll Luna, MD;  Location: [place];  Service: ?             General;  Laterality: N/A; ?No date: INSERTION OF ICD ?    Comment:  medtronic ?1990s: LAPAROSCOPIC CHOLECYSTECTOMY ?04/27/2019: ROBOT ASSITED LAPAROSCOPIC NEPHROURETERECTOMY; Left ?    Comment:  Procedure: XI ROBOT ASSITED LAPAROSCOPIC  ?             NEPHROURETERECTOMY;  Surgeon: Alexis Frock, MD;   ?             Location: [facility];  Service: Urology;  Laterality: Left;   ?             3.5 HRS ?age 69: TONSILLECTOMY  ? ? ?The history is provided by the patient and the spouse. No language interpreter was used.  ?Flank Pain ?Associated symptoms include shortness of breath. Pertinent negatives  include no chest pain.  ? ?  ? ?Home Medications ?Prior to Admission medications   ?Medication Sig Start Date End Date Taking? Authorizing Provider  ?acetaminophen (TYLENOL) 325 MG tablet Take 2 tablets (650 mg total) by mouth every 6 (six) hours as needed. 03/23/21  Yes Wynona Dove A, DO  ?cephALEXin (KEFLEX) 500 MG capsule Take  1 capsule (500 mg total) by mouth 2 (two) times daily for 7 days. 03/23/21 03/30/21 Yes Wynona Dove A, DO  ?lidocaine (LIDODERM) 5 % Place 1 patch onto the skin daily as needed. Remove & Discard patch within 12 hours or as directed by MD 03/23/21  Yes Jeanell Sparrow, DO  ?ALPRAZolam (XANAX) 1 MG tablet Take 1.5 mg by mouth at bedtime. 12/09/18   [provider]  ?amiodarone (PACERONE) 200 MG tablet Take 0.5 tablets (100 mg total) by mouth daily. 02/26/21   Deboraha Sprang, MD  ?anastrozole (ARIMIDEX) 1 MG tablet TAKE 1 TABLET BY MOUTH EVERY DAY 10/08/20   Magrinat, [name], MD  ?apixaban (ELIQUIS) 5 MG TABS tablet TAKE 1 TABLET BY MOUTH TWICE A DAY 01/18/21   Deboraha Sprang, MD  ?Cholecalciferol (VITAMIN D) 50 MCG (2000 UT) CAPS Take 2,000 Units by mouth daily.    [provider]  ?Choline Fenofibrate (FENOFIBRIC ACID) 135 MG CPDR Take 135 mg by mouth daily.  01/16/16   [provider]  ?ferrous sulfate 325 (65 FE) MG tablet Take 325 mg by mouth daily with breakfast.     [provider]  ?levothyroxine (SYNTHROID, LEVOTHROID) 112 MCG tablet Take 112 mcg by mouth at bedtime.  03/13/16   [provider]  ?methocarbamol (ROBAXIN) 500 MG tablet Take 1 tablet (500 mg total) by mouth 2 (two) times daily for 5 days. 03/23/21 03/28/21  Jeanell Sparrow, DO  ?mirtazapine (REMERON) 15 MG tablet Take 1 tablet by mouth at bedtime. 02/14/21   [provider]  ?psyllium (REGULOID) 0.52 g capsule Take 4.16 g by mouth daily.    [provider]  ?   ? ?Allergies    ?Patient has no known allergies.   ? ?Review of Systems   ?Review of Systems  ?Constitutional:  Negative for chills and fever.  ?Respiratory:  Positive for shortness of breath. Negative for cough.   ?Cardiovascular:  Negative for chest pain.  ?Musculoskeletal:  Positive for back pain.  ?Neurological:  Negative for numbness.  ? ?Physical Exam ?Updated Vital Signs ?BP (!) 163/101 (BP Location: Left Arm)   Pulse (!) 57    Temp 97.9 ?F (36.6 ?C)   Resp 16   Ht 5' 5"  (1.651 m)   Wt 73.9 kg   SpO2 99%   BMI 27.12 kg/m?  ?Physical Exam ?Vitals and nursing note reviewed.  ?Constitutional:   ?   General: She is not in acute distre

## 2021-03-24 LAB — URINE CULTURE: Culture: 30000 — AB

## 2021-03-25 ENCOUNTER — Telehealth: Payer: Self-pay | Admitting: Emergency Medicine

## 2021-03-25 NOTE — Telephone Encounter (Signed)
Post ED Visit - Positive Culture Follow-up ? ?Culture report reviewed by antimicrobial stewardship pharmacist: ?[place] Team ?'[]'$  Elenor Quinones, Pharm.D. ?'[]'$  Heide Guile, Pharm.D., BCPS AQ-ID ?'[]'$  Parks Neptune, Pharm.D., BCPS ?'[]'$  Alycia Rossetti, Pharm.D., BCPS ?'[]'$  [place], Pharm.D., BCPS, AAHIVP ?'[]'$  Legrand Como, Pharm.D., BCPS, AAHIVP ?'[]'$  Salome Arnt, PharmD, BCPS ?'[]'$  Johnnette Gourd, PharmD, BCPS ?'[]'$  Hughes Better, PharmD, BCPS ?'[]'$  Leeroy Cha, PharmD ?'[]'$  Laqueta Linden, PharmD, BCPS ?'[]'$  Albertina Parr, PharmD ? ?[place] Team ?'[]'$  Leodis Sias, PharmD ?'[]'$  Lindell Spar, PharmD ?'[]'$  Royetta Asal, PharmD ?'[]'$  Graylin Shiver, Rph ?'[]'$  Rema Fendt) Glennon Mac, PharmD ?'[]'$  Arlyn Dunning, PharmD ?'[]'$  Netta Cedars, PharmD ?'[]'$  Dia Sitter, PharmD ?'[]'$  Leone Haven, PharmD ?'[]'$  Gretta Arab, PharmD ?'[]'$  Theodis Shove, PharmD ?'[]'$  Peggyann Juba, PharmD ?'[]'$  Reuel Boom, PharmD ? ? ?Positive urine culture ?Treated with cephalexin, organism sensitive to the same and no further patient follow-up is required at this time. ? ?Cynthia [name] ?03/25/2021, 10:33 AM ?  ?

## 2021-04-01 ENCOUNTER — Ambulatory Visit (INDEPENDENT_AMBULATORY_CARE_PROVIDER_SITE_OTHER): Payer: Medicare PPO

## 2021-04-01 DIAGNOSIS — R55 Syncope and collapse: Secondary | ICD-10-CM

## 2021-04-02 DIAGNOSIS — F331 Major depressive disorder, recurrent, moderate: Secondary | ICD-10-CM | POA: Diagnosis not present

## 2021-04-02 DIAGNOSIS — G4700 Insomnia, unspecified: Secondary | ICD-10-CM | POA: Diagnosis not present

## 2021-04-02 DIAGNOSIS — F419 Anxiety disorder, unspecified: Secondary | ICD-10-CM | POA: Diagnosis not present

## 2021-04-02 LAB — CUP PACEART REMOTE DEVICE CHECK
Date Time Interrogation Session: 20230319230316
Implantable Pulse Generator Implant Date: 20210521

## 2021-04-08 DIAGNOSIS — M5451 Vertebrogenic low back pain: Secondary | ICD-10-CM | POA: Diagnosis not present

## 2021-04-11 NOTE — Progress Notes (Signed)
[employer] Summary Report / Loop Recorder 

## 2021-04-18 ENCOUNTER — Encounter (HOSPITAL_BASED_OUTPATIENT_CLINIC_OR_DEPARTMENT_OTHER): Payer: Self-pay

## 2021-04-18 ENCOUNTER — Emergency Department (HOSPITAL_BASED_OUTPATIENT_CLINIC_OR_DEPARTMENT_OTHER): Payer: Medicare PPO | Admitting: Radiology

## 2021-04-18 ENCOUNTER — Other Ambulatory Visit: Payer: Self-pay

## 2021-04-18 ENCOUNTER — Emergency Department (HOSPITAL_BASED_OUTPATIENT_CLINIC_OR_DEPARTMENT_OTHER)
Admission: EM | Admit: 2021-04-18 | Discharge: 2021-04-18 | Disposition: A | Discharge status: Home / Self Care | Payer: Medicare PPO | Attending: Emergency Medicine | Admitting: Emergency Medicine

## 2021-04-18 DIAGNOSIS — R399 Unspecified symptoms and signs involving the genitourinary system: Secondary | ICD-10-CM | POA: Diagnosis not present

## 2021-04-18 DIAGNOSIS — I4891 Unspecified atrial fibrillation: Secondary | ICD-10-CM | POA: Insufficient documentation

## 2021-04-18 DIAGNOSIS — Z79899 Other long term (current) drug therapy: Secondary | ICD-10-CM | POA: Diagnosis not present

## 2021-04-18 DIAGNOSIS — R300 Dysuria: Secondary | ICD-10-CM | POA: Diagnosis not present

## 2021-04-18 DIAGNOSIS — R0602 Shortness of breath: Secondary | ICD-10-CM | POA: Insufficient documentation

## 2021-04-18 DIAGNOSIS — Z7901 Long term (current) use of anticoagulants: Secondary | ICD-10-CM | POA: Diagnosis not present

## 2021-04-18 DIAGNOSIS — K219 Gastro-esophageal reflux disease without esophagitis: Secondary | ICD-10-CM | POA: Diagnosis not present

## 2021-04-18 DIAGNOSIS — I4811 Longstanding persistent atrial fibrillation: Secondary | ICD-10-CM

## 2021-04-18 DIAGNOSIS — R000 Tachycardia, unspecified: Secondary | ICD-10-CM | POA: Diagnosis not present

## 2021-04-18 DIAGNOSIS — K2960 Other gastritis without bleeding: Secondary | ICD-10-CM

## 2021-04-18 LAB — CBC
HCT: 44.2 % (ref 36.0–46.0)
Hemoglobin: 14.6 g/dL (ref 12.0–15.0)
MCH: 31.1 pg (ref 26.0–34.0)
MCHC: 33 g/dL (ref 30.0–36.0)
MCV: 94.2 fL (ref 80.0–100.0)
Platelets: 313 10*3/uL (ref 150–400)
RBC: 4.69 MIL/uL (ref 3.87–5.11)
RDW: 13.8 % (ref 11.5–15.5)
WBC: 17 10*3/uL — ABNORMAL HIGH (ref 4.0–10.5)
nRBC: 0 % (ref 0.0–0.2)

## 2021-04-18 LAB — URINALYSIS, ROUTINE W REFLEX MICROSCOPIC
Bilirubin Urine: NEGATIVE
Glucose, UA: NEGATIVE mg/dL
Ketones, ur: NEGATIVE mg/dL
Nitrite: NEGATIVE
Protein, ur: 30 mg/dL — AB
Specific Gravity, Urine: 1.023 (ref 1.005–1.030)
pH: 6.5 (ref 5.0–8.0)

## 2021-04-18 LAB — BASIC METABOLIC PANEL
Anion gap: 11 (ref 5–15)
BUN: 27 mg/dL — ABNORMAL HIGH (ref 8–23)
CO2: 25 mmol/L (ref 22–32)
Calcium: 9.5 mg/dL (ref 8.9–10.3)
Chloride: 102 mmol/L (ref 98–111)
Creatinine, Ser: 1.36 mg/dL — ABNORMAL HIGH (ref 0.44–1.00)
GFR, Estimated: 40 mL/min — ABNORMAL LOW (ref >60)
Glucose, Bld: 101 mg/dL — ABNORMAL HIGH (ref 70–99)
Potassium: 3.9 mmol/L (ref 3.5–5.1)
Sodium: 138 mmol/L (ref 135–145)

## 2021-04-18 LAB — TROPONIN I (HIGH SENSITIVITY): Troponin I (High Sensitivity): 14 ng/L (ref <18)

## 2021-04-18 MED ORDER — SODIUM CHLORIDE 0.9 % IV BOLUS
500.0000 mL | Freq: Once | INTRAVENOUS | Status: AC
  Administered 2021-04-18: 500 mL via INTRAVENOUS

## 2021-04-18 MED ORDER — ESOMEPRAZOLE MAGNESIUM 40 MG PO CPDR: 40.0000 mg | DELAYED_RELEASE_CAPSULE | Freq: Every day | ORAL | 0 refills | Status: DC

## 2021-04-18 MED ORDER — FAMOTIDINE IN NACL 20-0.9 MG/50ML-% IV SOLN
20.0000 mg | Freq: Once | INTRAVENOUS | Status: AC
  Administered 2021-04-18: 20 mg via INTRAVENOUS
  Filled 2021-04-18: qty 50

## 2021-04-18 NOTE — ED Triage Notes (Signed)
Pt states she thinks she has a UTI. Frequent urination. Pt states she has hx of GERD. Reports low energy and Sob.  ?

## 2021-04-18 NOTE — ED Notes (Signed)
Patient verbalizes understanding of discharge instructions. Opportunity for questioning and answers were provided. Patient discharged from ED.  °

## 2021-04-18 NOTE — Discharge Instructions (Signed)
You have atrial fibrillation. ? ?Please continue current meds for A-fib ? ?You may have reflux so I added Nexium 40 mg daily ? ?You should follow-up with GI ? ?Please also follow-up with your primary care doctor and cardiologist ? ?Return to ER if you have worse chest pain, shortness of breath, trouble urinating  ?

## 2021-04-18 NOTE — ED Provider Notes (Signed)
?Cynthia [name] ?Provider Note ? ? ?CSN: [id number] ?Arrival date & time: 04/18/21  1450 ? ?  ? ?History ? ?Chief Complaint  ?Patient presents with  ? Atrial Fibrillation  ? Urinary Tract Infection  ? ? ?Cynthia [name] is a 76 y.o. female hx of afib on amiodarone and eliquis, here with possible UTI, shortness of breath.  Patient states that she has been short of breath and had palpitations for the last several days.  She initially thought it was reflux symptoms.  She called her doctor was told to come here for further evaluation.  Patient also states that she has uterine prolapse at baseline.  She states that she has some dysuria and wants to make sure she does have a UTI as well.  Denies any flank pain or fever. ? ?The history is provided by the patient.  ? ?  ? ?Home Medications ?Prior to Admission medications   ?Medication Sig Start Date End Date Taking? Authorizing Provider  ?acetaminophen (TYLENOL) 325 MG tablet Take 2 tablets (650 mg total) by mouth every 6 (six) hours as needed. 03/23/21   Jeanell Sparrow, DO  ?ALPRAZolam (XANAX) 1 MG tablet Take 1.5 mg by mouth at bedtime. 12/09/18   [provider]  ?amiodarone (PACERONE) 200 MG tablet Take 0.5 tablets (100 mg total) by mouth daily. 02/26/21   Deboraha Sprang, MD  ?anastrozole (ARIMIDEX) 1 MG tablet TAKE 1 TABLET BY MOUTH EVERY DAY 10/08/20   Magrinat, [name], MD  ?apixaban (ELIQUIS) 5 MG TABS tablet TAKE 1 TABLET BY MOUTH TWICE A DAY 01/18/21   Deboraha Sprang, MD  ?Cholecalciferol (VITAMIN D) 50 MCG (2000 UT) CAPS Take 2,000 Units by mouth daily.    [provider]  ?Choline Fenofibrate (FENOFIBRIC ACID) 135 MG CPDR Take 135 mg by mouth daily.  01/16/16   [provider]  ?ferrous sulfate 325 (65 FE) MG tablet Take 325 mg by mouth daily with breakfast.     [provider]  ?levothyroxine (SYNTHROID, LEVOTHROID) 112 MCG tablet Take 112 mcg by mouth at bedtime.  03/13/16   [provider]   ?lidocaine (LIDODERM) 5 % Place 1 patch onto the skin daily as needed. Remove & Discard patch within 12 hours or as directed by MD 03/23/21   Wynona Dove A, DO  ?mirtazapine (REMERON) 15 MG tablet Take 1 tablet by mouth at bedtime. 02/14/21   [provider]  ?psyllium (REGULOID) 0.52 g capsule Take 4.16 g by mouth daily.    [provider]  ?   ? ?Allergies    ?Patient has no known allergies.   ? ?Review of Systems   ?Review of Systems  ?Respiratory:  Positive for shortness of breath.   ?Cardiovascular:  Positive for palpitations.  ?All other systems reviewed and are negative. ? ?Physical Exam ?Updated Vital Signs ?BP 116/74   Pulse 98   Temp 99.3 ?F (37.4 ?C) (Oral)   Resp 17   Ht '5\' 5"'$  (1.651 m)   Wt 73 kg   SpO2 97%   BMI 26.79 kg/m?  ?Physical Exam ?Vitals and nursing note reviewed.  ?Constitutional:   ?   Comments: Chronically ill  ?HENT:  ?   Head: Normocephalic.  ?   Nose: Nose normal.  ?   Mouth/Throat:  ?   Mouth: Mucous membranes are moist.  ?Eyes:  ?   Extraocular Movements: Extraocular movements intact.  ?   Pupils: Pupils are equal, round, and reactive to light.  ?  Cardiovascular:  ?   Rate and Rhythm: Tachycardia present. Rhythm irregular.  ?   Pulses: Normal pulses.  ?   Heart sounds: Normal heart sounds.  ?Pulmonary:  ?   Effort: Pulmonary effort is normal.  ?   Breath sounds: Normal breath sounds.  ?Abdominal:  ?   General: Abdomen is flat.  ?   Palpations: Abdomen is soft.  ?Musculoskeletal:     ?   General: Normal range of motion.  ?   Cervical back: Normal range of motion and neck supple.  ?Skin: ?   General: Skin is warm.  ?   Capillary Refill: Capillary refill takes less than 2 seconds.  ?Neurological:  ?   General: No focal deficit present.  ?   Mental Status: She is oriented to person, place, and time.  ?Psychiatric:     ?   Mood and Affect: Mood normal.     ?   Behavior: Behavior normal.  ? ? ?ED Results / Procedures / Treatments   ?Labs ?(all labs ordered are listed,  but only abnormal results are displayed) ?Labs Reviewed  ?BASIC METABOLIC PANEL - Abnormal; Notable for the following components:  ?    Result Value  ? Glucose, Bld 101 (*)   ? BUN 27 (*)   ? Creatinine, Ser 1.36 (*)   ? GFR, Estimated 40 (*)   ? All other components within normal limits  ?CBC - Abnormal; Notable for the following components:  ? WBC 17.0 (*)   ? All other components within normal limits  ?URINALYSIS, ROUTINE W REFLEX MICROSCOPIC  ?TROPONIN I (HIGH SENSITIVITY)  ?TROPONIN I (HIGH SENSITIVITY)  ? ? ?EKG ?EKG Interpretation ? ?Date/Time:  Thursday April 18 2021 15:12:39 EDT ?Ventricular Rate:  106 ?PR Interval:    ?QRS Duration: 98 ?QT Interval:  360 ?QTC Calculation: 478 ?R Axis:   -26 ?Text Interpretation: Atrial fibrillation with rapid ventricular response Incomplete right bundle branch block Nonspecific ST abnormality Abnormal ECG When compared with ECG of 23-Mar-2021 11:38, PREVIOUS ECG IS PRESENT Confirmed by Wandra Arthurs [phone]) on 04/18/2021 4:01:05 PM ? ?Radiology ?No results found. ? ?Procedures ?Procedures  ? ? ?Medications Ordered in ED ?Medications  ?famotidine (PEPCID) IVPB 20 mg premix (20 mg Intravenous New Bag/Given 04/18/21 1703)  ?sodium chloride 0.9 % bolus 500 mL (500 mLs Intravenous New Bag/Given 04/18/21 1703)  ? ? ?ED Course/ Medical Decision Making/ A&P ?  ?                        ?Medical Decision Making ?Cynthia [name] is a 76 y.o. female here with urinary frequency and reflux symptoms and some palpitations.  Patient has a history of A-fib and is in A-fib with a rate of 110s.  I think she likely has symptomatic A-fib.  We will get CBC and CMP and troponin.  We will also get urinalysis to rule out UTI.  We will give some IV fluids and reassess ? ?6:40 PM ?Labs show white blood count of 17.  However she is afebrile and urinalysis did not show any obvious UTI.  Heart function is normal as well.  Patient's heart rate is down to the 80s after IV fluids.  I think she is likely just  mildly dehydrated.  She felt better after Pepcid.  She wants medicines for reflux and told her to start on some Nexium and follow-up with GI ? ?Problems Addressed: ?Longstanding persistent atrial fibrillation ([place]): acute illness or injury ?  Reflux gastritis: acute illness or injury ? ?Amount and/or Complexity of Data Reviewed ?External Data Reviewed: notes. ?Labs: ordered. Decision-making details documented in ED Course. ?Radiology: ordered and independent interpretation performed. Decision-making details documented in ED Course. ?ECG/medicine tests: ordered and independent interpretation performed. Decision-making details documented in ED Course. ? ?Risk ?Prescription drug management. ? ?Final Clinical Impression(s) / ED Diagnoses ?Final diagnoses:  ?None  ? ? ?Rx / DC Orders ?ED Discharge Orders   ? ? None  ? ?  ? ? ?  ?Drenda Freeze, MD ?04/18/21 1842 ? ?

## 2021-04-19 ENCOUNTER — Telehealth: Payer: Self-pay | Admitting: Internal Medicine

## 2021-04-19 NOTE — Telephone Encounter (Signed)
Spoke with pt who reports she had a flare of IBS and fells she was dehydrated from diarrhea. She developed chills on Tuesday.  She states her husband had another stroke on Tuesday as well.  Pt saw her PCP yesterday for chills, fatigue and SOB.  She was sent from the PCP office to the ED because she was in Afib.  Pt states she is taking her Amiodarone and Eliquis as prescribed.  BP was low earlier in the week but is now normal.  Pt reports HR has been normal.  Pt reports she is no longer in Afib.  She is due to see Dr Caryl Comes in July.   ?Pt advised to continue her current medications and follow up.  She was advised to contact office if she continues to have symptomatic episodes of Afib or Afib with rapid heart rates.  Pt verbalized understanding and thanked Therapist, sports for the call. ?

## 2021-04-19 NOTE — Telephone Encounter (Signed)
Patient c/o Palpitations:  High priority if patient c/o lightheadedness, shortness of breath, or chest pain ? ?How long have you had palpitations/irregular HR/ Afib? Are you having the symptoms now? Yes ? ?Are you currently experiencing lightheadedness, SOB or CP? Lightheadedness, sob ? ?Do you have a history of afib (atrial fibrillation) or irregular heart rhythm? yes ? ?Have you checked your BP or HR? (document readings if available): 144/77 ? ?Are you experiencing any other symptoms? Nausea, diarrhea  ? ?

## 2021-04-22 DIAGNOSIS — M5451 Vertebrogenic low back pain: Secondary | ICD-10-CM | POA: Diagnosis not present

## 2021-04-24 DIAGNOSIS — R399 Unspecified symptoms and signs involving the genitourinary system: Secondary | ICD-10-CM | POA: Diagnosis not present

## 2021-05-01 DIAGNOSIS — R350 Frequency of micturition: Secondary | ICD-10-CM | POA: Diagnosis not present

## 2021-05-01 DIAGNOSIS — R310 Gross hematuria: Secondary | ICD-10-CM | POA: Diagnosis not present

## 2021-05-01 DIAGNOSIS — R8271 Bacteriuria: Secondary | ICD-10-CM | POA: Diagnosis not present

## 2021-05-02 LAB — CUP PACEART REMOTE DEVICE CHECK
Date Time Interrogation Session: 20230426230934
Implantable Pulse Generator Implant Date: 20210521

## 2021-05-06 ENCOUNTER — Ambulatory Visit (INDEPENDENT_AMBULATORY_CARE_PROVIDER_SITE_OTHER): Payer: Medicare PPO

## 2021-05-06 DIAGNOSIS — R55 Syncope and collapse: Secondary | ICD-10-CM

## 2021-05-07 DIAGNOSIS — C652 Malignant neoplasm of left renal pelvis: Secondary | ICD-10-CM | POA: Diagnosis not present

## 2021-05-07 DIAGNOSIS — K5730 Diverticulosis of large intestine without perforation or abscess without bleeding: Secondary | ICD-10-CM | POA: Diagnosis not present

## 2021-05-07 DIAGNOSIS — D7389 Other diseases of spleen: Secondary | ICD-10-CM | POA: Diagnosis not present

## 2021-05-13 DIAGNOSIS — C652 Malignant neoplasm of left renal pelvis: Secondary | ICD-10-CM | POA: Diagnosis not present

## 2021-05-13 DIAGNOSIS — R350 Frequency of micturition: Secondary | ICD-10-CM | POA: Diagnosis not present

## 2021-05-15 ENCOUNTER — Other Ambulatory Visit: Payer: Self-pay | Admitting: Urology

## 2021-05-16 ENCOUNTER — Other Ambulatory Visit: Payer: Self-pay | Admitting: Gastroenterology

## 2021-05-16 ENCOUNTER — Telehealth: Payer: Self-pay | Admitting: *Deleted

## 2021-05-16 DIAGNOSIS — R053 Chronic cough: Secondary | ICD-10-CM | POA: Diagnosis not present

## 2021-05-16 DIAGNOSIS — K219 Gastro-esophageal reflux disease without esophagitis: Secondary | ICD-10-CM | POA: Diagnosis not present

## 2021-05-16 DIAGNOSIS — R490 Dysphonia: Secondary | ICD-10-CM | POA: Diagnosis not present

## 2021-05-16 DIAGNOSIS — I700 Atherosclerosis of aorta: Secondary | ICD-10-CM

## 2021-05-16 NOTE — Telephone Encounter (Signed)
? ?  Pre-operative Risk Assessment  ?  ?Patient Name: Cynthia [name]  ?DOB: 29-Oct-1945 ?MRN: [id number]  ? ?  ? ?Request for Surgical Clearance   ? ?Procedure:   ENDOSCOPY  ? ?Date of Surgery:  Clearance 09/11/21                              ?   ?Surgeon:  DR. Paulita Fujita ?Surgeon's Group or Practice Name:  [facility] ?Phone number:  [phone] ?Fax number:  [phone] ?  ?Type of Clearance Requested:   ?- Medical  ?- Pharmacy:  Hold Apixaban (Eliquis) x 2 DAYS PRIOR ?  ?Type of Anesthesia:   PROPOFOL ?  ?Additional requests/questions:   ? ?Signed, ?Julaine Hua   ?05/16/2021, 6:13 PM  ? ?

## 2021-05-17 DIAGNOSIS — I700 Atherosclerosis of aorta: Secondary | ICD-10-CM | POA: Insufficient documentation

## 2021-05-17 NOTE — Telephone Encounter (Signed)
Patient with diagnosis of afib on Eliquis for anticoagulation.   ? ?Procedure: endoscopy ?Date of procedure: 09/11/21 ? ?CHA2DS2-VASc Score = 6  ?This indicates a 9.7% annual risk of stroke. ?The patient's score is based upon: ?CHF History: 0 ?HTN History: 1 ?Diabetes History: 1 ?Stroke History: 0 ?Vascular Disease History: 1 ?Age Score: 2 ?Gender Score: 1 ?  ?CrCl 47m/min ?Platelet count 313K ? ?Aortic atherosclerosis noted on abdominal CT 02/2020, PMH updated. ? ?Per office protocol, patient can hold Eliquis for 2 days prior to procedure as requested assuming no major clinical changes between now and procedure date (4 months away). ?

## 2021-05-17 NOTE — Telephone Encounter (Signed)
I tried to reach the pt to set up tele pre op appt closer to procedure date, no answer and no vm came on ?

## 2021-05-17 NOTE — Telephone Encounter (Signed)
Attention-patient's procedure scheduled for 09/11/2021.  When reviewing signs and symptoms with patient make sure that it is indicated that if she has any changes in her current cardiac status she contact the office for further instruction/evaluation. ? ?Preoperative team, please contact this patient and set up a phone call appointment for further cardiac evaluation.  Thank you for your help. ? ?Jossie Ng. Airelle Everding NP-C ? ?  ?05/17/2021, 2:28 PM ?[place] ?[place] [address] ?Office [phone] Fax [phone] ? ?

## 2021-05-20 ENCOUNTER — Telehealth: Payer: Self-pay | Admitting: *Deleted

## 2021-05-20 NOTE — Telephone Encounter (Signed)
Pt agreeable to plan of care for tele pre op appt 08/05/21 @ 4 pm. Med rec and consent are done.  ?  ?Pt said when she is ready for her refill on Amiodarone, she asked if it could be called in as the 100 mg tablet now, so she won't have to cut it in 1/2. I said that will be and the best way is for her to call the office and ask for the refill, if she calls the pharmacy they will probably send over the 200 mg tablet.  ?

## 2021-05-20 NOTE — Progress Notes (Signed)
[employer] Summary Report / Loop Recorder 

## 2021-05-20 NOTE — Telephone Encounter (Signed)
Pt agreeable to plan of care for tele pre op appt 08/05/21 @ 4 pm. Med rec and consent are done.  ? ?Pt said when she is ready for her refill on Amiodarone, she asked if it could be called in as the 100 mg tablet now, so she won't have to cut it in 1/2. I said that will be and the best way is for her to call the office and ask for the refill, if she calls the pharmacy they will probably send over the 200 mg tablet.  ?

## 2021-05-22 DIAGNOSIS — R8279 Other abnormal findings on microbiological examination of urine: Secondary | ICD-10-CM | POA: Diagnosis not present

## 2021-05-22 DIAGNOSIS — R310 Gross hematuria: Secondary | ICD-10-CM | POA: Diagnosis not present

## 2021-05-22 DIAGNOSIS — N3000 Acute cystitis without hematuria: Secondary | ICD-10-CM | POA: Diagnosis not present

## 2021-05-28 DIAGNOSIS — F331 Major depressive disorder, recurrent, moderate: Secondary | ICD-10-CM | POA: Diagnosis not present

## 2021-05-28 DIAGNOSIS — G4700 Insomnia, unspecified: Secondary | ICD-10-CM | POA: Diagnosis not present

## 2021-05-28 DIAGNOSIS — F419 Anxiety disorder, unspecified: Secondary | ICD-10-CM | POA: Diagnosis not present

## 2021-05-31 ENCOUNTER — Encounter: Payer: Self-pay | Admitting: Internal Medicine

## 2021-05-31 NOTE — Progress Notes (Signed)
COVID Vaccine Completed: yes x2  Date of COVID positive in last 90 days:  PCP - [name], MD Cardiologist - Virl Axe, MD  Needs cardiac clearance  Chest x-ray - 04/18/21 Epic EKG - 04/18/21 Epic Stress Test -  ECHO - 12/11/18 Epic  Cardiac Cath -  Pacemaker/ICD device last checked: 05/01/21 Epic Spinal Cord Stimulator:  Bowel Prep -   Sleep Study -  CPAP -   Fasting Blood Sugar -  Checks Blood Sugar _____ times a day  Blood Thinner Instructions: Eliquis, hold 2 days Aspirin Instructions: Last Dose:  Activity level:  Can go up a flight of stairs and perform activities of daily living without stopping and without symptoms of chest pain or shortness of breath.   Able to exercise without symptoms  Unable to go up a flight of stairs without symptoms of      Anesthesia review: A fib, ICD, DM2, HTN, atherosclerosis   Patient denies shortness of breath, fever, cough and chest pain at PAT appointment   Patient verbalized understanding of instructions that were given to them at the PAT appointment. Patient was also instructed that they will need to review over the PAT instructions again at home before surgery.

## 2021-05-31 NOTE — Progress Notes (Signed)
PERIOPERATIVE PRESCRIPTION FOR IMPLANTED CARDIAC DEVICE PROGRAMMING  Patient Information: Name:  [name]  DOB:  07/17/1945  MRN:  [id number]    Planned Procedure:  transurethral resection of bladder tumor, cystoscopy  Surgeon:  Dr. Tresa Moore  Date of Procedure:  06/05/21  Cautery will be used.  Position during surgery:  unknown   Please send documentation back to:  [facility] (Fax # [phone])   Device Information:  Clinic EP Physician:  Virl Axe, MD   Device Type:  Medtronic Loop Recorder  Manufacturer and Phone #:  Medtronic: [phone]  Date of Last Device Check:  05/01/2021 Normal Device Function?:  Yes.    Electrophysiologist's Recommendations:  Patient has Loop recorder, no recommendations for this type of device.  Per Device Clinic Standing Orders, Wanda Plump, RN  9:17 AM 05/31/2021

## 2021-05-31 NOTE — Patient Instructions (Addendum)
DUE TO COVID-19 ONLY TWO VISITORS  (aged 78 and older)  ARE ALLOWED TO COME WITH YOU AND STAY IN THE WAITING ROOM ONLY DURING PRE OP AND PROCEDURE.   **NO VISITORS ARE ALLOWED IN THE SHORT STAY AREA OR RECOVERY ROOM!!**    Your procedure is scheduled on: 06/05/21   Report to [facility] Main Entrance    Report to admitting at 9:45 AM   Call this number if you have problems the morning of surgery [phone]   Do not eat food :After Midnight.   After Midnight you may have the following liquids until 9:00 AM DAY OF SURGERY  Water Black Coffee (sugar ok, NO MILK/CREAM OR CREAMERS)  Tea (sugar ok, NO MILK/CREAM OR CREAMERS) regular and decaf                             Plain Jell-O (NO RED)                                           Fruit ices (not with fruit pulp, NO RED)                                     Popsicles (NO RED)                                                                  Juice: apple, WHITE grape, WHITE cranberry Sports drinks like Gatorade (NO RED) Clear broth(vegetable,chicken,beef)  FOLLOW BOWEL PREP AND ANY ADDITIONAL PRE OP INSTRUCTIONS YOU RECEIVED FROM YOUR SURGEON'S OFFICE!!!     Oral Hygiene is also important to reduce your risk of infection.                                    Remember - BRUSH YOUR TEETH THE MORNING OF SURGERY WITH YOUR REGULAR TOOTHPASTE   Take these medicines the morning of surgery with A SIP OF WATER: Amiodorone, Anastrolze, Choline fenofibrate, Pantoprazole.   DO NOT TAKE ANY ORAL DIABETIC MEDICATIONS DAY OF YOUR SURGERY  How to Manage Your Diabetes Before and After Surgery  Why is it important to control my blood sugar before and after surgery? Improving blood sugar levels before and after surgery helps healing and can limit problems. A way of improving blood sugar control is eating a healthy diet by:  Eating less sugar and carbohydrates  Increasing activity/exercise  Talking with your doctor about reaching your blood  sugar goals High blood sugars (greater than 180 mg/dL) can raise your risk of infections and slow your recovery, so you will need to focus on controlling your diabetes during the weeks before surgery. Make sure that the doctor who takes care of your diabetes knows about your planned surgery including the date and location.  How do I manage my blood sugar before surgery? Check your blood sugar at least 4 times a day, starting 2 days before surgery, to make sure that the level is not too  high or low. Check your blood sugar the morning of your surgery when you wake up and every 2 hours until you get to the Short Stay unit. If your blood sugar is less than 70 mg/dL, you will need to treat for low blood sugar: Do not take insulin. Treat a low blood sugar (less than 70 mg/dL) with  cup of clear juice (cranberry or apple), 4 glucose tablets, OR glucose gel. Recheck blood sugar in 15 minutes after treatment (to make sure it is greater than 70 mg/dL). If your blood sugar is not greater than 70 mg/dL on recheck, call [phone] for further instructions. Report your blood sugar to the short stay nurse when you get to Short Stay.  If you are admitted to the hospital after surgery: Your blood sugar will be checked by the staff and you will probably be given insulin after surgery (instead of oral diabetes medicines) to make sure you have good blood sugar levels. The goal for blood sugar control after surgery is 80-180 mg/dL.  Reviewed and Endorsed by [facility] Patient Education Committee, August 2015                               You may not have any metal on your body including hair pins, jewelry, and body piercing             Do not wear make-up, lotions, powders, perfumes, or deodorant  Do not wear nail polish including gel and S&S, artificial/acrylic nails, or any other type of covering on natural nails including finger and toenails. If you have artificial nails, gel coating, etc. that needs to be  removed by a nail salon please have this removed prior to surgery or surgery may need to be canceled/ delayed if the surgeon/ anesthesia feels like they are unable to be safely monitored.   Do not shave  48 hours prior to surgery.    Do not bring valuables to the hospital. [place]    Patients discharged on the day of surgery will not be allowed to drive home.  Someone NEEDS to stay with you for the first 24 hours after anesthesia.              Please read over the following fact sheets you were given: IF YOU HAVE QUESTIONS ABOUT YOUR PRE-OP INSTRUCTIONS PLEASE CALL [facility] - Preparing for Surgery Before surgery, you can play an important role.  Because skin is not sterile, your skin needs to be as free of germs as possible.  You can reduce the number of germs on your skin by washing with CHG (chlorahexidine gluconate) soap before surgery.  CHG is an antiseptic cleaner which kills germs and bonds with the skin to continue killing germs even after washing. Please DO NOT use if you have an allergy to CHG or antibacterial soaps.  If your skin becomes reddened/irritated stop using the CHG and inform your nurse when you arrive at Short Stay. Do not shave (including legs and underarms) for at least 48 hours prior to the first CHG shower.  You may shave your face/neck.  Please follow these instructions carefully:  1.  Shower with CHG Soap the night before surgery and the  morning of surgery.  2.  If you choose to wash  your hair, wash your hair first as usual with your normal  shampoo.  3.  After you shampoo, rinse your hair and body thoroughly to remove the shampoo.                             4.  Use CHG as you would any other liquid soap.  You can apply chg directly to the skin and wash.  Gently with a scrungie or clean washcloth.  5.  Apply the CHG Soap to your body ONLY FROM THE NECK DOWN.   Do   not use on face/ open                            Wound or open sores. Avoid contact with eyes, ears mouth and   genitals (private parts).                       Wash face,  Genitals (private parts) with your normal soap.             6.  Wash thoroughly, paying special attention to the area where your    surgery  will be performed.  7.  Thoroughly rinse your body with warm water from the neck down.  8.  DO NOT shower/wash with your normal soap after using and rinsing off the CHG Soap.                9.  Pat yourself dry with a clean towel.            10.  Wear clean pajamas.            11.  Place clean sheets on your bed the night of your first shower and do not  sleep with pets. Day of Surgery : Do not apply any lotions/deodorants the morning of surgery.  Please wear clean clothes to the hospital/surgery center.  FAILURE TO FOLLOW THESE INSTRUCTIONS MAY RESULT IN THE CANCELLATION OF YOUR SURGERY  PATIENT SIGNATURE_________________________________  NURSE SIGNATURE__________________________________  ________________________________________________________________________

## 2021-06-04 ENCOUNTER — Encounter (HOSPITAL_COMMUNITY)
Admission: RE | Admit: 2021-06-04 | Discharge: 2021-06-04 | Disposition: A | Discharge status: Home / Self Care | Payer: Medicare PPO | Source: Ambulatory Visit | Attending: Urology | Admitting: Urology

## 2021-06-04 ENCOUNTER — Encounter (HOSPITAL_COMMUNITY): Payer: Self-pay

## 2021-06-04 VITALS — BP 145/75 | HR 65 | Temp 97.7°F | Resp 14 | Ht 65.5 in | Wt 163.0 lb

## 2021-06-04 DIAGNOSIS — I10 Essential (primary) hypertension: Secondary | ICD-10-CM | POA: Diagnosis not present

## 2021-06-04 DIAGNOSIS — Z01812 Encounter for preprocedural laboratory examination: Secondary | ICD-10-CM | POA: Insufficient documentation

## 2021-06-04 DIAGNOSIS — E119 Type 2 diabetes mellitus without complications: Secondary | ICD-10-CM | POA: Insufficient documentation

## 2021-06-04 LAB — BASIC METABOLIC PANEL
Anion gap: 7 (ref 5–15)
BUN: 23 mg/dL (ref 8–23)
CO2: 27 mmol/L (ref 22–32)
Calcium: 9.4 mg/dL (ref 8.9–10.3)
Chloride: 109 mmol/L (ref 98–111)
Creatinine, Ser: 1.24 mg/dL — ABNORMAL HIGH (ref 0.44–1.00)
GFR, Estimated: 45 mL/min — ABNORMAL LOW (ref >60)
Glucose, Bld: 68 mg/dL — ABNORMAL LOW (ref 70–99)
Potassium: 4.1 mmol/L (ref 3.5–5.1)
Sodium: 143 mmol/L (ref 135–145)

## 2021-06-04 LAB — HEMOGLOBIN A1C
Hgb A1c MFr Bld: 5.9 % — ABNORMAL HIGH (ref 4.8–5.6)
Mean Plasma Glucose: 122.63 mg/dL

## 2021-06-04 LAB — CBC
HCT: 41.5 % (ref 36.0–46.0)
Hemoglobin: 13.4 g/dL (ref 12.0–15.0)
MCH: 31.2 pg (ref 26.0–34.0)
MCHC: 32.3 g/dL (ref 30.0–36.0)
MCV: 96.5 fL (ref 80.0–100.0)
Platelets: 280 10*3/uL (ref 150–400)
RBC: 4.3 MIL/uL (ref 3.87–5.11)
RDW: 13.8 % (ref 11.5–15.5)
WBC: 4.4 10*3/uL (ref 4.0–10.5)
nRBC: 0 % (ref 0.0–0.2)

## 2021-06-04 LAB — GLUCOSE, CAPILLARY: Glucose-Capillary: 107 mg/dL — ABNORMAL HIGH (ref 70–99)

## 2021-06-04 NOTE — Anesthesia Preprocedure Evaluation (Signed)
Anesthesia Evaluation  Patient identified by MRN, date of birth, ID band Patient awake    Reviewed: Allergy & Precautions, NPO status , Patient's Chart, lab work & pertinent test results  Airway Mallampati: II  TM Distance: >3 FB Neck ROM: Full    Dental no notable dental hx. (+) Teeth Intact, Dental Advisory Given   Pulmonary    Pulmonary exam normal breath sounds clear to auscultation       Cardiovascular hypertension, Pt. on medications Normal cardiovascular exam+ dysrhythmias Atrial Fibrillation + Cardiac Defibrillator  Rhythm:Regular Rate:Normal  12/20 TTE 1. Left ventricular ejection fraction, by visual estimation, is 50 to  55%. The left ventricle has normal function. There is no left ventricular  hypertrophy.  2. Global right ventricle has normal systolic function.The right  ventricular size is normal. No increase in right ventricular wall    Neuro/Psych    GI/Hepatic GERD  ,  Endo/Other  diabetesHypothyroidism   Renal/GU Renal InsufficiencyRenal diseaseLab Results      Component                Value               Date                      CREATININE               1.24 (H)            06/04/2021              K                        4.1                 06/04/2021              Bladder tumor    Musculoskeletal  (+) Arthritis ,   Abdominal   Peds  Hematology Lab Results      Component                Value               Date                      WBC                      4.4                 06/04/2021                HGB                      13.4                06/04/2021                HCT                      41.5                06/04/2021                MCV                      96.5  06/04/2021                PLT                      280                 06/04/2021              Anesthesia Other Findings R breast CA  Reproductive/Obstetrics                             Anesthesia Physical Anesthesia Plan  ASA: 3  Anesthesia Plan: General   Post-op Pain Management:    Induction: Intravenous  PONV Risk Score and Plan: 3 and Treatment may vary due to age or medical condition and Ondansetron  Airway Management Planned: LMA  Additional Equipment: None  Intra-op Plan:   Post-operative Plan:   Informed Consent: I have reviewed the patients History and Physical, chart, labs and discussed the procedure including the risks, benefits and alternatives for the proposed anesthesia with the patient or authorized representative who has indicated his/her understanding and acceptance.     Dental advisory given  Plan Discussed with: CRNA and Anesthesiologist  Anesthesia Plan Comments:        Anesthesia Quick Evaluation

## 2021-06-05 ENCOUNTER — Ambulatory Visit (HOSPITAL_BASED_OUTPATIENT_CLINIC_OR_DEPARTMENT_OTHER): Payer: Medicare PPO | Admitting: Anesthesiology

## 2021-06-05 ENCOUNTER — Ambulatory Visit (HOSPITAL_COMMUNITY): Payer: Medicare PPO

## 2021-06-05 ENCOUNTER — Encounter (HOSPITAL_COMMUNITY): Payer: Self-pay | Admitting: Urology

## 2021-06-05 ENCOUNTER — Encounter (HOSPITAL_COMMUNITY)
Admission: RE | Disposition: A | Discharge status: Home / Self Care | Payer: Self-pay | Source: Home / Self Care | Attending: Urology

## 2021-06-05 ENCOUNTER — Ambulatory Visit (HOSPITAL_COMMUNITY)
Admission: RE | Admit: 2021-06-05 | Discharge: 2021-06-05 | Disposition: A | Discharge status: Home / Self Care | Payer: Medicare PPO | Attending: Urology | Admitting: Urology

## 2021-06-05 ENCOUNTER — Ambulatory Visit (HOSPITAL_COMMUNITY): Payer: Medicare PPO | Admitting: Physician Assistant

## 2021-06-05 DIAGNOSIS — Z95810 Presence of automatic (implantable) cardiac defibrillator: Secondary | ICD-10-CM | POA: Insufficient documentation

## 2021-06-05 DIAGNOSIS — C679 Malignant neoplasm of bladder, unspecified: Secondary | ICD-10-CM

## 2021-06-05 DIAGNOSIS — K219 Gastro-esophageal reflux disease without esophagitis: Secondary | ICD-10-CM | POA: Diagnosis not present

## 2021-06-05 DIAGNOSIS — Z923 Personal history of irradiation: Secondary | ICD-10-CM | POA: Diagnosis not present

## 2021-06-05 DIAGNOSIS — Z7901 Long term (current) use of anticoagulants: Secondary | ICD-10-CM | POA: Insufficient documentation

## 2021-06-05 DIAGNOSIS — Z905 Acquired absence of kidney: Secondary | ICD-10-CM | POA: Insufficient documentation

## 2021-06-05 DIAGNOSIS — I480 Paroxysmal atrial fibrillation: Secondary | ICD-10-CM | POA: Diagnosis not present

## 2021-06-05 DIAGNOSIS — I4891 Unspecified atrial fibrillation: Secondary | ICD-10-CM

## 2021-06-05 DIAGNOSIS — I10 Essential (primary) hypertension: Secondary | ICD-10-CM | POA: Insufficient documentation

## 2021-06-05 DIAGNOSIS — Z79899 Other long term (current) drug therapy: Secondary | ICD-10-CM | POA: Diagnosis not present

## 2021-06-05 DIAGNOSIS — Q631 Lobulated, fused and horseshoe kidney: Secondary | ICD-10-CM | POA: Diagnosis not present

## 2021-06-05 DIAGNOSIS — Z8553 Personal history of malignant neoplasm of renal pelvis: Secondary | ICD-10-CM | POA: Diagnosis not present

## 2021-06-05 DIAGNOSIS — E119 Type 2 diabetes mellitus without complications: Secondary | ICD-10-CM

## 2021-06-05 DIAGNOSIS — C678 Malignant neoplasm of overlapping sites of bladder: Secondary | ICD-10-CM | POA: Diagnosis not present

## 2021-06-05 DIAGNOSIS — Z853 Personal history of malignant neoplasm of breast: Secondary | ICD-10-CM | POA: Insufficient documentation

## 2021-06-05 HISTORY — PX: CYSTOSCOPY W/ RETROGRADES: SHX1426

## 2021-06-05 HISTORY — PX: TRANSURETHRAL RESECTION OF BLADDER TUMOR: SHX2575

## 2021-06-05 LAB — GLUCOSE, CAPILLARY
Glucose-Capillary: 95 mg/dL (ref 70–99)
Glucose-Capillary: 62 mg/dL — ABNORMAL LOW (ref 70–99)
Glucose-Capillary: 79 mg/dL (ref 70–99)

## 2021-06-05 SURGERY — TURBT (TRANSURETHRAL RESECTION OF BLADDER TUMOR)
Anesthesia: General | Laterality: Right

## 2021-06-05 MED ORDER — ACETAMINOPHEN 10 MG/ML IV SOLN: 1000.0000 mg | Freq: Once | INTRAVENOUS | Status: DC | PRN

## 2021-06-05 MED ORDER — ONDANSETRON HCL 4 MG/2ML IJ SOLN
INTRAMUSCULAR | Status: DC | PRN
  Administered 2021-06-05: 4 mg via INTRAVENOUS

## 2021-06-05 MED ORDER — FENTANYL CITRATE (PF) 100 MCG/2ML IJ SOLN
INTRAMUSCULAR | Status: DC | PRN
  Administered 2021-06-05 (×2): 25 ug via INTRAVENOUS
  Administered 2021-06-05: 50 ug via INTRAVENOUS

## 2021-06-05 MED ORDER — ORAL CARE MOUTH RINSE: 15.0000 mL | Freq: Once | OROMUCOSAL | Status: AC

## 2021-06-05 MED ORDER — PROPOFOL 10 MG/ML IV BOLUS
INTRAVENOUS | Status: AC
  Filled 2021-06-05: qty 20

## 2021-06-05 MED ORDER — FENTANYL CITRATE (PF) 100 MCG/2ML IJ SOLN
INTRAMUSCULAR | Status: AC
Start: 2021-06-05 — End: ?
  Filled 2021-06-05: qty 2

## 2021-06-05 MED ORDER — PHENYLEPHRINE HCL (PRESSORS) 10 MG/ML IV SOLN
INTRAVENOUS | Status: AC
Start: 2021-06-05 — End: ?
  Filled 2021-06-05: qty 1

## 2021-06-05 MED ORDER — HYDROCODONE-ACETAMINOPHEN 5-325 MG PO TABS
1.0000 | ORAL_TABLET | Freq: Four times a day (QID) | ORAL | 0 refills | Status: DC | PRN
Start: 2021-06-05 — End: 2021-09-03

## 2021-06-05 MED ORDER — IOHEXOL 300 MG/ML  SOLN
INTRAMUSCULAR | Status: DC | PRN
  Administered 2021-06-05: 10 mL

## 2021-06-05 MED ORDER — LIDOCAINE 2% (20 MG/ML) 5 ML SYRINGE
INTRAMUSCULAR | Status: DC | PRN
  Administered 2021-06-05: 100 mg via INTRAVENOUS

## 2021-06-05 MED ORDER — PHENYLEPHRINE HCL-NACL 20-0.9 MG/250ML-% IV SOLN
INTRAVENOUS | Status: DC | PRN
  Administered 2021-06-05: 35 ug/min via INTRAVENOUS

## 2021-06-05 MED ORDER — LIDOCAINE HCL (PF) 2 % IJ SOLN
INTRAMUSCULAR | Status: AC
  Filled 2021-06-05: qty 5

## 2021-06-05 MED ORDER — LACTATED RINGERS IV SOLN: INTRAVENOUS | Status: DC

## 2021-06-05 MED ORDER — PROPOFOL 10 MG/ML IV BOLUS
INTRAVENOUS | Status: DC | PRN
  Administered 2021-06-05: 130 mg via INTRAVENOUS

## 2021-06-05 MED ORDER — CHLORHEXIDINE GLUCONATE 0.12 % MT SOLN
15.0000 mL | Freq: Once | OROMUCOSAL | Status: AC
  Administered 2021-06-05: 15 mL via OROMUCOSAL

## 2021-06-05 MED ORDER — ONDANSETRON HCL 4 MG/2ML IJ SOLN
INTRAMUSCULAR | Status: AC
  Filled 2021-06-05: qty 2

## 2021-06-05 MED ORDER — PHENYLEPHRINE HCL (PRESSORS) 10 MG/ML IV SOLN
INTRAVENOUS | Status: AC
  Filled 2021-06-05: qty 1

## 2021-06-05 MED ORDER — FENTANYL CITRATE PF 50 MCG/ML IJ SOSY: 25.0000 ug | PREFILLED_SYRINGE | INTRAMUSCULAR | Status: DC | PRN

## 2021-06-05 MED ORDER — ONDANSETRON HCL 4 MG/2ML IJ SOLN: 4.0000 mg | Freq: Once | INTRAMUSCULAR | Status: DC | PRN

## 2021-06-05 MED ORDER — CEPHALEXIN 500 MG PO CAPS: 500.0000 mg | ORAL_CAPSULE | Freq: Two times a day (BID) | ORAL | 0 refills | Status: AC

## 2021-06-05 MED ORDER — GENTAMICIN SULFATE 40 MG/ML IJ SOLN
5.0000 mg/kg | INTRAVENOUS | Status: AC
  Administered 2021-06-05: 320 mg via INTRAVENOUS
  Filled 2021-06-05: qty 8

## 2021-06-05 MED ORDER — SODIUM CHLORIDE 0.9 % IR SOLN
Status: DC | PRN
  Administered 2021-06-05: 4200 mL

## 2021-06-05 MED ORDER — SENNOSIDES-DOCUSATE SODIUM 8.6-50 MG PO TABS
1.0000 | ORAL_TABLET | Freq: Two times a day (BID) | ORAL | 0 refills | Status: DC
Start: 2021-06-05 — End: 2021-09-03

## 2021-06-05 SURGICAL SUPPLY — 23 items
BAG COUNTER SPONGE SURGICOUNT (BAG) IMPLANT
BAG URINE DRAIN 2000ML AR STRL (UROLOGICAL SUPPLIES) IMPLANT
BAG URO CATCHER STRL LF (MISCELLANEOUS) ×3 IMPLANT
CATH URETL OPEN END 6FR 70 (CATHETERS) IMPLANT
CLOTH BEACON ORANGE TIMEOUT ST (SAFETY) ×3 IMPLANT
DRAPE FOOT SWITCH (DRAPES) ×3 IMPLANT
ELECT REM PT RETURN 15FT ADLT (MISCELLANEOUS) ×3 IMPLANT
EVACUATOR MICROVAS BLADDER (UROLOGICAL SUPPLIES) IMPLANT
GLOVE SURG LX 7.5 STRW (GLOVE) ×1
GLOVE SURG LX STRL 7.5 STRW (GLOVE) ×2 IMPLANT
GOWN SRG XL LVL 4 BRTHBL STRL (GOWNS) ×2 IMPLANT
GOWN STRL NON-REIN XL LVL4 (GOWNS) ×1
GUIDEWIRE STR DUAL SENSOR (WIRE) ×3 IMPLANT
KIT TURNOVER KIT A (KITS) IMPLANT
LOOP CUT BIPOLAR 24F LRG (ELECTROSURGICAL) ×1 IMPLANT
MANIFOLD NEPTUNE II (INSTRUMENTS) ×3 IMPLANT
NS IRRIG 1000ML POUR BTL (IV SOLUTION) IMPLANT
PACK CYSTO (CUSTOM PROCEDURE TRAY) ×3 IMPLANT
SYR TOOMEY IRRIG 70ML (MISCELLANEOUS)
SYRINGE TOOMEY IRRIG 70ML (MISCELLANEOUS) IMPLANT
TRAY FOLEY MTR SLVR 16FR STAT (SET/KITS/TRAYS/PACK) ×1 IMPLANT
TUBING CONNECTING 10 (TUBING) ×3 IMPLANT
TUBING UROLOGY SET (TUBING) ×3 IMPLANT

## 2021-06-05 NOTE — Op Note (Unsigned)
NAMESAILOR, HEVIA MEDICAL RECORD NO: [id number] ACCOUNT NO: [id number] DATE OF BIRTH: Jan 02, 1946 FACILITY: [facility] LOCATION: [facility] PHYSICIAN: Alexis Frock, MD  Operative Report   DATE OF PROCEDURE: 06/05/2021  PREOPERATIVE DIAGNOSIS:  Bladder cancer, history of left renal pelvis cancer.  PROCEDURES:   1.  Cystoscopy, right retrograde pyelogram, interpretation. 2.  Transurethral resection bladder tumor, volume medium.  ESTIMATED BLOOD LOSS:  Nil.  COMPLICATIONS:  None.  SPECIMENS:   1.  Bladder tumor. 2.  Base of bladder tumor for permanent pathology.  FINDINGS:   1.  Quite high-grade and infiltrative appearing papillary neoplasm in the left trigone and along the presumed course of the left intramural ureter stump. 2.  Unremarkable right retrograde pyelogram.  DRAINS:  Foley catheter to straight drain.  INDICATIONS:  This is a very pleasant 76 year old lady with history of left renal pelvis cancer.  She is status post nephroureterectomy previously and has done well functionally from this.  She was found on routine surveillance to have a new nodular  density in her left hemitrigone area concerning for possible recurrence likely close to her prior left ureteral orifice.  Options were discussed including recommended path of transurethral resection for therapeutic and diagnostic intent along with right  retrograde.  She presents for this today.  Informed consent was obtained and placed in medical record.  PROCEDURE DETAILS:  The patient is being identified and procedure being transurethral resection bladder tumor and right retrograde pyelogram was confirmed.  Procedure timeout was performed.  Intravenous antibiotics were administered.  General anesthesia  was induced.  The patient was placed into a low lithotomy position.  Sterile field was created, prepped and draped the patient's vagina, introitus, and proximal thighs using iodine.  Moderate rectocele noted.  Cystourethroscopy was  performed using  21-French rigid cystoscope with offset lens.  Inspection of the anterior and posterior urethra was unremarkable.  Inspection of urinary bladder did reveal some papillary and nodular tumor on the left hemitrigone very close to the presumed area of prior  left ureteral orifice.  Rest of the bladder was unremarkable.  The right ureteral orifice was cannulated with 6-French end-hole catheter and right retrograde pyelogram was obtained.  Right retrograde pyelogram demonstrated single right ureter, single system right kidney, no filling defects or narrowing noted.  Next, cystoscope was exchanged to a 26-French resectoscope sheath with visual obturator and using resectoscope loop, very  careful resection was performed down to superficial fibromuscular stroma of the bladder into the area of papillary tumor.  Total volume was approximately 3 cm2.  This did appear to be contiguous with the previous left intramural ureter area.  All  resectable obvious cancerous tissue was removed, irrigated and set aside labeled as bladder tumor.  Next, cold cup biopsy forceps were used to obtain representative seromuscular bites from the deep aspect.  It was set aside, labeled as base of bladder  tumor.  The resectoscope was then once again used to fulgurate the entire base resection.  This was purposely quite deep.  There was a small amount of perivesical fat visible, but without obvious extravasation of fluid. It was felt the most prudent path  will be to leave a catheter for 3-5 days to allow for initial healing before re-pressurization and voiding.  As such, a new 16-French Foley catheter was placed per urethra to straight drain, 10 mL water in the balloon.  Efflux was clear.  The procedure  was terminated.  The patient tolerated the procedure well, no immediate perioperative complications.  The patient was taken to postanesthesia care in stable condition.  Plan for discharge home with catheter.   PUS D:  06/05/2021 12:58:20 pm T: 06/05/2021 2:21:00 pm  JOB: [id number]/ [id number]

## 2021-06-05 NOTE — Anesthesia Procedure Notes (Signed)
Procedure Name: LMA Insertion Date/Time: 06/05/2021 12:14 PM Performed by: Maxwell Caul, CRNA Pre-anesthesia Checklist: Patient identified, Emergency Drugs available, Suction available and Patient being monitored Patient Re-evaluated:Patient Re-evaluated prior to induction Oxygen Delivery Method: Circle system utilized Preoxygenation: Pre-oxygenation with 100% oxygen Induction Type: IV induction LMA: LMA with gastric port inserted LMA Size: 4.0 Number of attempts: 1 Placement Confirmation: positive ETCO2 and breath sounds checked- equal and bilateral Tube secured with: Tape Dental Injury: Teeth and Oropharynx as per pre-operative assessment

## 2021-06-05 NOTE — Transfer of Care (Signed)
Immediate Anesthesia Transfer of Care Note  Patient: Cynthia [name]  Procedure(s) Performed: TRANSURETHRAL RESECTION OF BLADDER TUMOR (TURBT) CYSTOSCOPY WITH RETROGRADE PYELOGRAM (Right)  Patient Location: PACU  Anesthesia Type:General  Level of Consciousness: awake, alert  and oriented  Airway & Oxygen Therapy: Patient Spontanous Breathing and Patient connected to face mask oxygen  Post-op Assessment: Report given to RN and Post -op Vital signs reviewed and stable  Post vital signs: Reviewed and stable  Last Vitals:  Vitals Value Taken Time  BP    Temp    Pulse 58 06/05/21 1304  Resp 14 06/05/21 1304  SpO2 100 % 06/05/21 1304  Vitals shown include unvalidated device data.  Last Pain:  Vitals:   06/05/21 1139  TempSrc:   PainSc: 0-No pain         Complications: No notable events documented.

## 2021-06-05 NOTE — H&P (Signed)
Cynthia [name] is an 76 y.o. female.    Chief Complaint: Pre-Op Trabsurethral Resection of Bladder Tumor / Rt Retrograde Pyelogream  HPI:   1 - High Grade Left Renal Pelvis Cancer - s/p left nephroureterectomy / horsehoe kidney divison 04/2019 for pT2N0Mx high grade renal pelvis cancer with NEGATIVE margins. ?  ?Post-op Surveillance:  ?11/2019 - cysto no recurrence, Cr 1.3  ?05/2020 - CT, cysto no recurrence, Cr 1.0; 11/2020 CT, CXR, Cysto - no recurrence, Cr 0.7. ? 05/2021 - CT, cysto - Lt trigone area recurrence. ?  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 Divided at neph-U 2021. ?  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 Most recent Cr <1.5 post-op. ??  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 Placed on Myurbetriq by NP and not much change. ?  ?5 - Ventral / Incisional Hernia - abtou 4cm defect, reducible incisional herina noted at prior kidney extraction site (midline) 11/2019. Modest bother. NO h/o strangulation / bowel obstruction. Had open repair by Dr. Brantley Stage and now very happy with result. ?  ?PMH sig for lap chole, AFib / Eliquus (no CVA, just prevention, follows. Dr. Jens Som cards), insomnia. 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 Her PCP is [name] MD ??  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 NO interval fevers. Most recent UCX non-clonal.    Past Medical History:  Diagnosis Date   AICD (automatic cardioverter/defibrillator) present    Medtronic   Anticoagulant long-term use    eliquis--- managed by cardiology   Anxiety    Arthritis    Bradycardia    Cancer of kidney ([place])    left   DDD (degenerative disc disease), lumbar    Depression    Dyspnea    ON EXERTION   First degree heart block    Genetic testing 06/25/2016   Ms. Kluth  underwent genetic counseling and testing for hereditary cancer syndromes on 06/17/2016. Her results were negative for mutations in all 46 genes analyzed by [facility]'s 46-gene Common Hereditary Cancers Panel. Genes analyzed include: APC, ATM, AXIN2, BARD1, BMPR1A, BRCA1, BRCA2, BRIP1, CDH1, CDKN2A, CHEK2, CTNNA1, DICER1, EPCAM, GREM1, HOXB13, KIT, MEN1, MLH1, MSH2, MSH3, MSH6, MUTYH, NBN,   GERD (gastroesophageal reflux disease)    occasional,  takes pepcid   Hematuria    History of radiation therapy 05/22/2016 to 06/20/2016   right breast cancer   Hypertension    followd by pcp---  (02-22-2019 per pt never had stress test)   Hypothyroidism    followed by pcp   IBS (irritable bowel syndrome)    Incomplete right bundle branch block    Insomnia    Malignant neoplasm of upper-inner quadrant of right breast in female, estrogen receptor positive [facility]) oncologist--- dr [name]   dx 03/ 2018,  Stage IA, IDC, ER/PR +;  04-01-2016 s/p  right breast lumpectomy w/ node dissection's;  completed radiation 06-20-2016   MVP (mitral valve prolapse)    per echo 12-11-2018 in epic, mild    NSVT (nonsustained ventricular tachycardia) ([place]) followed by cardiology   12-10-2018  hospital admission ,  refer to discharge note 12-14-2018 for treatement   PAF (paroxysmal atrial fibrillation) [facility]) primary cardiologist--- dr Margaretann Loveless   newly dx 12-10-2018  admission in epic, Afib w/ RVR and NSVT;  in epic TTE 12-11-2018 showed ef 50-55%, mild MVP,  mild AV sclerosis without stenosis;   Cardiac MRI 12-13-2018 in epic   Renal mass, left    pelvis    Restless leg syndrome    occasional   Type 2 diabetes mellitus ([place])    followed by pcp---  (02-22-2019 does not check blood sugar's)    Past Surgical History:  Procedure Laterality Date   BREAST LUMPECTOMY WITH RADIOACTIVE SEED AND SENTINEL LYMPH NODE BIOPSY Right 04/01/2016   Procedure: RADIOACTIVE SEED GUIDED RIGHT BREAST LUMPECTOMY WITH RIGHT AXILLARY SENTINEL LYMPH  NODE BIOPSY.;  Surgeon: Fanny Skates, MD;  Location: [place];  Service: General;  Laterality: Right;   CARDIAC CATHETERIZATION  12/11/2018   CATARACT EXTRACTION W/ INTRAOCULAR LENS  IMPLANT, BILATERAL  1980s   COLONOSCOPY     CYSTOSCOPY/RETROGRADE/URETEROSCOPY N/A 03/02/2019   Procedure: CYSTOSCOPY/LEFT RETROGRADE/URETEROSCOPY/  BIOPSY/  STENT;  Surgeon: Ceasar Mons, MD;  Location: [facility];  Service: Urology;  Laterality: N/A;   INCISIONAL HERNIA REPAIR N/A 03/29/2020   Procedure: REPAIR OF INCISIONAL HERNIA WITH MESH;  Surgeon: Erroll Luna, MD;  Location: [place];  Service: General;  Laterality: N/A;   INSERTION OF ICD     [place] LAPAROSCOPIC NEPHROURETERECTOMY Left 04/27/2019   Procedure: XI ROBOT ASSITED LAPAROSCOPIC NEPHROURETERECTOMY;  Surgeon: Alexis Frock, MD;  [facility];  Service: Urology;  Laterality: Left;  3.5 HRS   TONSILLECTOMY  age 4    Family History  Problem Relation Age of Onset   Breast cancer Sister 95       d.54   Leukemia Brother 2   Cervical cancer Sister 65   Ovarian cancer Maternal Aunt 92       d.92s   Breast cancer Other 70   Social History:  reports that she has never smoked. She has never used smokeless tobacco. She reports current alcohol use. She reports that she does not use drugs.  Allergies: No Known Allergies  No medications prior to admission.    Results for orders placed or performed during the hospital encounter of 06/04/21 (from the past 48 hour(s))  Glucose, capillary     Status: Abnormal   Collection Time: 06/04/21  1:22 PM  Result Value Ref Range   Glucose-Capillary 107 (H) 70 - 99 mg/dL    Comment: Glucose reference range applies only to samples taken after fasting for at least 8 hours.  Hemoglobin A1c per protocol     Status: Abnormal   Collection Time: 06/04/21  1:58 PM  Result Value Ref Range   Hgb A1c MFr Bld 5.9 (H) 4.8 - 5.6 %     Comment: (NOTE) Pre diabetes:          5.7%-6.4%  Diabetes:              >6.4%  Glycemic control for   <7.0% adults with diabetes    Mean Plasma Glucose 122.63 mg/dL    Comment: Performed at [facility] [address] [place], [place] [address]  Basic metabolic panel per protocol     Status: Abnormal   Collection Time: 06/04/21  1:58 PM  Result Value Ref Range   Sodium 143 135 - 145 mmol/L   Potassium 4.1 3.5 - 5.1 mmol/L   Chloride 109 98 - 111 mmol/L   CO2 27 22 - 32 mmol/L   Glucose, Bld 68 (L) 70 - 99 mg/dL  Comment: Glucose reference range applies only to samples taken after fasting for at least 8 hours.   BUN 23 8 - 23 mg/dL   Creatinine, Ser 1.24 (H) 0.44 - 1.00 mg/dL   Calcium 9.4 8.9 - 10.3 mg/dL   GFR, Estimated 45 (L) >60 mL/min    Comment: (NOTE) Calculated using the CKD-EPI Creatinine Equation (2021)    Anion gap 7 5 - 15    Comment: Performed at [facility], [place] [address] [place], [place] [address]  CBC per protocol     Status: None   Collection Time: 06/04/21  1:58 PM  Result Value Ref Range   WBC 4.4 4.0 - 10.5 K/uL   RBC 4.30 3.87 - 5.11 MIL/uL   Hemoglobin 13.4 12.0 - 15.0 g/dL   HCT 41.5 36.0 - 46.0 %   MCV 96.5 80.0 - 100.0 fL   MCH 31.2 26.0 - 34.0 pg   MCHC 32.3 30.0 - 36.0 g/dL   RDW 13.8 11.5 - 15.5 %   Platelets 280 150 - 400 K/uL   nRBC 0.0 0.0 - 0.2 %    Comment: Performed at [facility], [place] [address] [place],  [address]   No results found.  Review of Systems  Constitutional:  Negative for chills and fever.  All other systems reviewed and are negative.  There were no vitals taken for this visit. Physical Exam HENT:     Head: Normocephalic.     Nose: Nose normal.  Eyes:     Pupils: Pupils are equal, round, and reactive to light.  Cardiovascular:     Rate and Rhythm: Normal rate.  Abdominal:     General: Abdomen is flat.     Comments: Prior scars w/o hernia recurrence.    Genitourinary:    Comments: No CVAT at present Musculoskeletal:        General: Normal range of motion.  Skin:    General: Skin is warm.  Neurological:     General: No focal deficit present.     Mental Status: She is alert.     Assessment/Plan  Proceed as planned with TURBT / Rt retrograde. Risks, benefits, alternatives, expected peri-op course discussed previously and reiterate today.    Alexis Frock, MD 06/05/2021, 5:59 AM

## 2021-06-05 NOTE — Brief Op Note (Signed)
06/05/2021  12:53 PM  PATIENT:  Cynthia [name]  75 y.o. female  PRE-OPERATIVE DIAGNOSIS:  BLADDER CANCER  POST-OPERATIVE DIAGNOSIS:  BLADDER CANCER  PROCEDURE:  Procedure(s): TRANSURETHRAL RESECTION OF BLADDER TUMOR (TURBT) (N/A) CYSTOSCOPY WITH RETROGRADE PYELOGRAM (Right)  SURGEON:  Surgeon(s) and Role:    * Alexis Frock, MD - Primary  PHYSICIAN ASSISTANT:   ASSISTANTS: none   ANESTHESIA:   general  EBL:  minimal   BLOOD ADMINISTERED:none  DRAINS:  foley to gravity    LOCAL MEDICATIONS USED:  MARCAINE     SPECIMEN:  Source of Specimen:  1- bladder tumor; 2 - base of bladder tumor  DISPOSITION OF SPECIMEN:  PATHOLOGY  COUNTS:  YES  TOURNIQUET:  * No tourniquets in log *  DICTATION: .Other Dictation: Dictation Number [id number]  PLAN OF CARE: Discharge to home after PACU  PATIENT DISPOSITION:  PACU - hemodynamically stable.   Delay start of Pharmacological VTE agent (>24hrs) due to surgical blood loss or risk of bleeding: not applicable

## 2021-06-05 NOTE — Discharge Instructions (Signed)
1 - You may have urinary urgency (bladder spasms) and bloody urine on / off for up to 2 weeks.  This is normal.  2 - Call MD or go to ER for fever >102, severe pain / nausea / vomiting not relieved by medications, or acute change in medical status  

## 2021-06-05 NOTE — Anesthesia Postprocedure Evaluation (Signed)
Anesthesia Post Note  Patient: Cynthia [name]  Procedure(s) Performed: TRANSURETHRAL RESECTION OF BLADDER TUMOR (TURBT) CYSTOSCOPY WITH RETROGRADE PYELOGRAM (Right)     Patient location during evaluation: PACU Anesthesia Type: General Level of consciousness: awake and alert Pain management: pain level controlled Vital Signs Assessment: post-procedure vital signs reviewed and stable Respiratory status: spontaneous breathing, nonlabored ventilation, respiratory function stable and patient connected to nasal cannula oxygen Cardiovascular status: blood pressure returned to baseline and stable Postop Assessment: no apparent nausea or vomiting Anesthetic complications: no   No notable events documented.  Last Vitals:  Vitals:   06/05/21 1040 06/05/21 1303  BP: (!) 142/79 139/79  Pulse: 65 (!) 58  Resp: 16 14  Temp: 37.1 C 36.9 C  SpO2: 97% 100%    Last Pain:  Vitals:   06/05/21 1303  TempSrc:   PainSc: 0-No pain                 Barnet Glasgow

## 2021-06-06 ENCOUNTER — Encounter (HOSPITAL_COMMUNITY): Payer: Self-pay | Admitting: Urology

## 2021-06-06 LAB — SURGICAL PATHOLOGY

## 2021-06-10 ENCOUNTER — Ambulatory Visit (INDEPENDENT_AMBULATORY_CARE_PROVIDER_SITE_OTHER): Payer: Medicare PPO

## 2021-06-10 DIAGNOSIS — R55 Syncope and collapse: Secondary | ICD-10-CM

## 2021-06-11 LAB — CUP PACEART REMOTE DEVICE CHECK
Date Time Interrogation Session: 20230529232156
Implantable Pulse Generator Implant Date: 20210521

## 2021-06-17 DIAGNOSIS — E559 Vitamin D deficiency, unspecified: Secondary | ICD-10-CM | POA: Diagnosis not present

## 2021-06-17 DIAGNOSIS — Z0000 Encounter for general adult medical examination without abnormal findings: Secondary | ICD-10-CM | POA: Diagnosis not present

## 2021-06-17 DIAGNOSIS — R7303 Prediabetes: Secondary | ICD-10-CM | POA: Diagnosis not present

## 2021-06-17 DIAGNOSIS — Z23 Encounter for immunization: Secondary | ICD-10-CM | POA: Diagnosis not present

## 2021-06-17 DIAGNOSIS — F5101 Primary insomnia: Secondary | ICD-10-CM | POA: Diagnosis not present

## 2021-06-17 DIAGNOSIS — I4891 Unspecified atrial fibrillation: Secondary | ICD-10-CM | POA: Diagnosis not present

## 2021-06-17 DIAGNOSIS — I10 Essential (primary) hypertension: Secondary | ICD-10-CM | POA: Diagnosis not present

## 2021-06-17 DIAGNOSIS — N1831 Chronic kidney disease, stage 3a: Secondary | ICD-10-CM | POA: Diagnosis not present

## 2021-06-17 DIAGNOSIS — E039 Hypothyroidism, unspecified: Secondary | ICD-10-CM | POA: Diagnosis not present

## 2021-06-18 DIAGNOSIS — C652 Malignant neoplasm of left renal pelvis: Secondary | ICD-10-CM | POA: Diagnosis not present

## 2021-06-18 DIAGNOSIS — R350 Frequency of micturition: Secondary | ICD-10-CM | POA: Diagnosis not present

## 2021-06-18 DIAGNOSIS — C678 Malignant neoplasm of overlapping sites of bladder: Secondary | ICD-10-CM | POA: Diagnosis not present

## 2021-06-19 ENCOUNTER — Telehealth: Payer: Self-pay | Admitting: Oncology

## 2021-06-19 NOTE — Telephone Encounter (Signed)
Scheduled appt per 6/14 referral. Pt is aware of appt date and time. Pt is aware to arrive 15 mins prior to appt time and to bring and updated insurance card. Pt is aware of appt location.   

## 2021-06-25 ENCOUNTER — Telehealth: Payer: Self-pay | Admitting: Internal Medicine

## 2021-06-25 NOTE — Telephone Encounter (Signed)
Attempted to call patient. No answer, unable to leave message. Will try again later.

## 2021-06-25 NOTE — Telephone Encounter (Signed)
Pt c/o medication issue:  1. Name of Medication:  amiodarone (PACERONE) 200 MG tablet  2. How are you currently taking this medication (dosage and times per day)? 1/2 pill per day  3. Are you having a reaction (difficulty breathing--STAT)?   No  4. What is your medication issue?   Patient wants to confirm is she is taking the right dosage.  She would like a new prescription with a lower dosage as she currently cuts her pills in half.

## 2021-06-26 DIAGNOSIS — K219 Gastro-esophageal reflux disease without esophagitis: Secondary | ICD-10-CM | POA: Diagnosis not present

## 2021-06-26 MED ORDER — AMIODARONE HCL 100 MG PO TABS: 100.0000 mg | ORAL_TABLET | Freq: Every day | ORAL | 3 refills | Status: DC

## 2021-06-26 NOTE — Telephone Encounter (Signed)
Spoke with the patient and advised that she is taking the correct dose 1/2 tablet (100 mg total) daily. Patient would like 100 mg tablets so she doesn't have to split them. Advised that I will send in a new RX for her.

## 2021-06-26 NOTE — Addendum Note (Signed)
Addended by: Antonieta Iba on: 06/26/2021 04:44 PM   Modules accepted: Orders

## 2021-06-27 NOTE — Progress Notes (Signed)
[employer] Summary Report / Loop Recorder 

## 2021-07-02 DIAGNOSIS — F419 Anxiety disorder, unspecified: Secondary | ICD-10-CM | POA: Diagnosis not present

## 2021-07-02 DIAGNOSIS — G4700 Insomnia, unspecified: Secondary | ICD-10-CM | POA: Diagnosis not present

## 2021-07-02 DIAGNOSIS — F331 Major depressive disorder, recurrent, moderate: Secondary | ICD-10-CM | POA: Diagnosis not present

## 2021-07-05 ENCOUNTER — Inpatient Hospital Stay: Payer: Medicare PPO

## 2021-07-05 ENCOUNTER — Inpatient Hospital Stay: Payer: Medicare PPO | Attending: Oncology | Admitting: Oncology

## 2021-07-05 ENCOUNTER — Encounter: Payer: Self-pay | Admitting: Radiology

## 2021-07-05 ENCOUNTER — Other Ambulatory Visit: Payer: Self-pay

## 2021-07-05 VITALS — BP 139/63 | HR 66 | Temp 98.1°F | Resp 16 | Ht 65.0 in | Wt 165.4 lb

## 2021-07-05 DIAGNOSIS — Z7901 Long term (current) use of anticoagulants: Secondary | ICD-10-CM | POA: Diagnosis not present

## 2021-07-05 DIAGNOSIS — Z8041 Family history of malignant neoplasm of ovary: Secondary | ICD-10-CM | POA: Diagnosis not present

## 2021-07-05 DIAGNOSIS — C689 Malignant neoplasm of urinary organ, unspecified: Secondary | ICD-10-CM

## 2021-07-05 DIAGNOSIS — Z806 Family history of leukemia: Secondary | ICD-10-CM | POA: Insufficient documentation

## 2021-07-05 DIAGNOSIS — Z79899 Other long term (current) drug therapy: Secondary | ICD-10-CM | POA: Insufficient documentation

## 2021-07-05 DIAGNOSIS — Z170 Estrogen receptor positive status [ER+]: Secondary | ICD-10-CM | POA: Diagnosis not present

## 2021-07-05 DIAGNOSIS — Z79811 Long term (current) use of aromatase inhibitors: Secondary | ICD-10-CM | POA: Insufficient documentation

## 2021-07-05 DIAGNOSIS — C679 Malignant neoplasm of bladder, unspecified: Secondary | ICD-10-CM | POA: Insufficient documentation

## 2021-07-05 DIAGNOSIS — Z8553 Personal history of malignant neoplasm of renal pelvis: Secondary | ICD-10-CM | POA: Diagnosis not present

## 2021-07-05 DIAGNOSIS — Z8049 Family history of malignant neoplasm of other genital organs: Secondary | ICD-10-CM | POA: Insufficient documentation

## 2021-07-05 DIAGNOSIS — C50211 Malignant neoplasm of upper-inner quadrant of right female breast: Secondary | ICD-10-CM | POA: Insufficient documentation

## 2021-07-05 DIAGNOSIS — Z803 Family history of malignant neoplasm of breast: Secondary | ICD-10-CM | POA: Diagnosis not present

## 2021-07-05 LAB — CBC WITH DIFFERENTIAL (CANCER CENTER ONLY)
Abs Immature Granulocytes: 0 10*3/uL (ref 0.00–0.07)
Basophils Absolute: 0.1 10*3/uL (ref 0.0–0.1)
Basophils Relative: 2 %
Eosinophils Absolute: 0.3 10*3/uL (ref 0.0–0.5)
Eosinophils Relative: 7 %
HCT: 39.1 % (ref 36.0–46.0)
Hemoglobin: 12.8 g/dL (ref 12.0–15.0)
Immature Granulocytes: 0 %
Lymphocytes Relative: 29 %
Lymphs Abs: 1.2 10*3/uL (ref 0.7–4.0)
MCH: 30.8 pg (ref 26.0–34.0)
MCHC: 32.7 g/dL (ref 30.0–36.0)
MCV: 94 fL (ref 80.0–100.0)
Monocytes Absolute: 0.4 10*3/uL (ref 0.1–1.0)
Monocytes Relative: 9 %
Neutro Abs: 2.3 10*3/uL (ref 1.7–7.7)
Neutrophils Relative %: 53 %
Platelet Count: 259 10*3/uL (ref 150–400)
RBC: 4.16 MIL/uL (ref 3.87–5.11)
RDW: 13.8 % (ref 11.5–15.5)
WBC Count: 4.3 10*3/uL (ref 4.0–10.5)
nRBC: 0 % (ref 0.0–0.2)

## 2021-07-05 LAB — CMP (CANCER CENTER ONLY)
ALT: 9 U/L (ref 0–44)
AST: 17 U/L (ref 15–41)
Albumin: 3.7 g/dL (ref 3.5–5.0)
Alkaline Phosphatase: 40 U/L (ref 38–126)
Anion gap: 6 (ref 5–15)
BUN: 22 mg/dL (ref 8–23)
CO2: 30 mmol/L (ref 22–32)
Calcium: 9.5 mg/dL (ref 8.9–10.3)
Chloride: 106 mmol/L (ref 98–111)
Creatinine: 1.22 mg/dL — ABNORMAL HIGH (ref 0.44–1.00)
GFR, Estimated: 45 mL/min — ABNORMAL LOW (ref >60)
Glucose, Bld: 69 mg/dL — ABNORMAL LOW (ref 70–99)
Potassium: 3.8 mmol/L (ref 3.5–5.1)
Sodium: 142 mmol/L (ref 135–145)
Total Bilirubin: 0.4 mg/dL (ref 0.3–1.2)
Total Protein: 6.7 g/dL (ref 6.5–8.1)

## 2021-07-05 MED ORDER — LIDOCAINE-PRILOCAINE 2.5-2.5 % EX CREA: 1.0000 | TOPICAL_CREAM | CUTANEOUS | 0 refills | Status: DC | PRN

## 2021-07-05 MED ORDER — PROCHLORPERAZINE MALEATE 10 MG PO TABS: 10.0000 mg | ORAL_TABLET | Freq: Four times a day (QID) | ORAL | 0 refills | Status: DC | PRN

## 2021-07-05 NOTE — Progress Notes (Signed)
Reason for the request:    Bladder cancer  HPI: I was asked by Dr. Tresa Moore to evaluate Cynthia [name] for evaluation of bladder cancer.  She is a 76 year old woman with breast cancer diagnosed in 2018.  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  She received adjuvant radiation therapy as well as anastrozole subsequently.  No mutation detected on her germline testing.  She also developed T2N0 high-grade urothelial carcinoma of the renal pelvis and underwent left nephro ureterectomy with negative margins in 2021.  She subsequently underwent TURBT on Jun 05, 2021 which showed a bladder tumor at the base.  The final pathology showed invasive high-grade papillary urothelial carcinoma invading into the muscularis propria.  She was evaluated by Dr. Tresa Moore felt that she would be a reasonable candidate radical cystectomy.  CT scan obtained in May 2023 of the abdomen and pelvis did not show any evidence of metastatic disease.    Clinically, she reports feeling reasonably well without any complaints.  She did have 1 episode of hematuria that led to her evaluation but no other episodes at this time.  She denies any pelvic pain or discomfort.  She denies any bone pain or pathological fractures.   She does not report any headaches, blurry vision, syncope or seizures. Does not report any fevers, chills or sweats.  Does not report any cough, wheezing or hemoptysis.  Does not report any chest pain, palpitation, orthopnea or leg edema.  Does not report any nausea, vomiting or abdominal pain.  Does not report any constipation or diarrhea.  Does not report any skeletal complaints.    Does not report frequency, urgency or hematuria.  Does not report any skin rashes or lesions. Does not report any heat or cold intolerance.  Does not report any lymphadenopathy or petechiae.  Does not report any anxiety or depression.  Remaining review of systems is  negative.     Past Medical History:  Diagnosis Date   AICD (automatic cardioverter/defibrillator) present    Medtronic   Anticoagulant long-term use    eliquis--- managed by cardiology   Anxiety    Arthritis    Bradycardia    Cancer of kidney ([place])    left   DDD (degenerative disc disease), lumbar    Depression    Dyspnea    ON EXERTION   First degree heart block    Genetic testing 06/25/2016   Ms. Schnick underwent genetic counseling and testing for hereditary cancer syndromes on 06/17/2016. Her results were negative for mutations in all 46 genes analyzed by [facility]'s 46-gene Common Hereditary Cancers Panel. Genes analyzed include: APC, ATM, AXIN2, BARD1, BMPR1A, BRCA1, BRCA2, BRIP1, CDH1, CDKN2A, CHEK2, CTNNA1, DICER1, EPCAM, GREM1, HOXB13, KIT, MEN1, MLH1, MSH2, MSH3, MSH6, MUTYH, NBN,   GERD (gastroesophageal reflux disease)    occasional,  takes pepcid   Hematuria    History of radiation therapy 05/22/2016 to 06/20/2016   right breast cancer   Hypertension    followd by pcp---  (02-22-2019 per pt never had stress test)   Hypothyroidism    followed by pcp   IBS (irritable bowel syndrome)    Incomplete right bundle branch block    Insomnia    Malignant neoplasm of upper-inner quadrant of right breast in female, estrogen receptor positive [facility]) oncologist--- dr [name]   dx 03/ 2018,  Stage IA, IDC, ER/PR +;  04-01-2016 s/p  right breast lumpectomy w/ node dissection's;  completed  radiation 06-20-2016   MVP (mitral valve prolapse)    per echo 12-11-2018 in epic, mild    NSVT (nonsustained ventricular tachycardia) ([place]) followed by cardiology   12-10-2018  hospital admission ,  refer to discharge note 12-14-2018 for treatement   PAF (paroxysmal atrial fibrillation) [facility]) primary cardiologist--- dr Margaretann Loveless   newly dx 12-10-2018  admission in epic, Afib w/ RVR and NSVT;   in epic TTE 12-11-2018 showed ef 50-55%, mild MVP,  mild AV sclerosis without stenosis;   Cardiac MRI  12-13-2018 in epic   Renal mass, left    pelvis    Restless leg syndrome    occasional   Type 2 diabetes mellitus ([place])    followed by pcp---  (02-22-2019 does not check blood sugar's)  :   Past Surgical History:  Procedure Laterality Date   BREAST LUMPECTOMY WITH RADIOACTIVE SEED AND SENTINEL LYMPH NODE BIOPSY Right 04/01/2016   Procedure: RADIOACTIVE SEED GUIDED RIGHT BREAST LUMPECTOMY WITH RIGHT AXILLARY SENTINEL LYMPH NODE BIOPSY.;  Surgeon: Fanny Skates, MD;  Location: [place];  Service: General;  Laterality: Right;   CARDIAC CATHETERIZATION  12/11/2018   CATARACT EXTRACTION W/ INTRAOCULAR LENS  IMPLANT, BILATERAL  1980s   COLONOSCOPY     CYSTOSCOPY W/ RETROGRADES Right 06/05/2021   Procedure: CYSTOSCOPY WITH RETROGRADE PYELOGRAM;  Surgeon: Alexis Frock, MD;  [facility];  Service: Urology;  Laterality: Right;   CYSTOSCOPY/RETROGRADE/URETEROSCOPY N/A 03/02/2019   Procedure: CYSTOSCOPY/LEFT RETROGRADE/URETEROSCOPY/  BIOPSY/  STENT;  Surgeon: Ceasar Mons, MD;  Location[facility];  Service: Urology;  Laterality: N/A;   INCISIONAL HERNIA REPAIR N/A 03/29/2020   Procedure: REPAIR OF INCISIONAL HERNIA WITH MESH;  Surgeon: Erroll Luna, MD;  Location: [place];  Service: General;  Laterality: N/A;   INSERTION OF ICD     [place] LAPAROSCOPIC NEPHROURETERECTOMY Left 04/27/2019   Procedure: XI ROBOT ASSITED LAPAROSCOPIC NEPHROURETERECTOMY;  Surgeon: Alexis Frock, MD;  [facility];  Service: Urology;  Laterality: Left;  3.5 HRS   TONSILLECTOMY  age 51   TRANSURETHRAL RESECTION OF BLADDER TUMOR N/A 06/05/2021   Procedure: TRANSURETHRAL RESECTION OF BLADDER TUMOR (TURBT);  Surgeon: Alexis Frock, MD;  [facility];  Service: Urology;  Laterality: N/A;  :   Current Outpatient Medications:    acetaminophen (TYLENOL) 500 MG tablet, Take 1,000 mg by mouth at bedtime., Disp: , Rfl:     ALPRAZolam (XANAX) 1 MG tablet, Take 2 mg by mouth at bedtime., Disp: , Rfl:    amiodarone (PACERONE) 100 MG tablet, Take 1 tablet (100 mg total) by mouth daily., Disp: 90 tablet, Rfl: 3   anastrozole (ARIMIDEX) 1 MG tablet, TAKE 1 TABLET BY MOUTH EVERY DAY, Disp: 90 tablet, Rfl: 4   apixaban (ELIQUIS) 5 MG TABS tablet, TAKE 1 TABLET BY MOUTH TWICE A DAY, Disp: 60 tablet, Rfl: 6   Cholecalciferol (VITAMIN D) 50 MCG (2000 UT) CAPS, Take 2,000 Units by mouth daily., Disp: , Rfl:    Choline Fenofibrate (FENOFIBRIC ACID) 135 MG CPDR, Take 135 mg by mouth daily. , Disp: , Rfl:    ferrous sulfate 325 (65 FE) MG tablet, Take 325 mg by mouth at bedtime., Disp: , Rfl:    HYDROcodone-acetaminophen (NORCO/VICODIN) 5-325 MG tablet, Take 1 tablet by mouth every 6 (six) hours as needed for moderate pain or severe pain (post-operatively)., Disp: 10 tablet, Rfl: 0   levothyroxine (SYNTHROID, LEVOTHROID) 112 MCG tablet, Take 112 mcg by  mouth at bedtime. , Disp: , Rfl:    lidocaine (LIDODERM) 5 %, Place 1 patch onto the skin daily as needed. Remove & Discard patch within 12 hours or as directed by MD (Patient not taking: Reported on 05/28/2021), Disp: 15 patch, Rfl: 0   mirtazapine (REMERON) 15 MG tablet, Take 15 mg by mouth at bedtime., Disp: , Rfl:    pantoprazole (PROTONIX) 40 MG tablet, Take 40 mg by mouth 2 (two) times daily., Disp: , Rfl:    psyllium (REGULOID) 0.52 g capsule, Take 3.64 g by mouth daily. 7 caps daily, Disp: , Rfl:    senna-docusate (SENOKOT-S) 8.6-50 MG tablet, Take 1 tablet by mouth 2 (two) times daily. While taking strong pain meds to prevent constipation, Disp: 10 tablet, Rfl: 0:  No Known Allergies:   Family History  Problem Relation Age of Onset   Breast cancer Sister 60       d.54   Leukemia Brother 46   Cervical cancer Sister 46   Ovarian cancer Maternal Aunt 92       d.92s   Breast cancer Other 53  :   Social History   Socioeconomic History   Marital status: Married     Spouse name: Not on file   Number of children: Not on file   Years of education: Not on file   Highest education level: Not on file  Occupational History   Not on file  Tobacco Use   Smoking status: Never   Smokeless tobacco: Never  Vaping Use   Vaping Use: Never used  Substance and Sexual Activity   Alcohol use: Yes    Comment: socially   Drug use: Never   Sexual activity: Not on file  Other Topics Concern   Not on file  Social History Narrative   Not on file   Social Determinants of Health   Financial Resource Strain: Not on file  Food Insecurity: Not on file  Transportation Needs: Not on file  Physical Activity: Not on file  Stress: Not on file  Social Connections: Not on file  Intimate Partner Violence: Not on file  :  Pertinent items are noted in HPI.  Exam: Blood pressure 139/63, pulse 66, temperature 98.1 F (36.7 C), temperature source Temporal, resp. rate 16, height $RemoveBe'5\' 5"'eVtTVEwqi$  (1.651 m), weight 165 lb 6.4 oz (75 kg), SpO2 98 %. ECOG 1 General appearance: alert and cooperative appeared without distress. Head: atraumatic without any abnormalities. Eyes: conjunctivae/corneas clear. PERRL.  Sclera anicteric. Throat: lips, mucosa, and tongue normal; without oral thrush or ulcers. Resp: clear to auscultation bilaterally without rhonchi, wheezes or dullness to percussion. Cardio: regular rate and rhythm, S1, S2 normal, no murmur, click, rub or gallop GI: soft, non-tender; bowel sounds normal; no masses,  no organomegaly Skin: Skin color, texture, turgor normal. No rashes or lesions Lymph nodes: Cervical, supraclavicular, and axillary nodes normal. Neurologic: Grossly normal without any motor, sensory or deep tendon reflexes. Musculoskeletal: No joint deformity or effusion.    Assessment and Plan:   76 year old with:  1.  Bladder cancer diagnosed in May 2023.  She was found to have invasive high-grade papillary urothelial carcinoma involving the base of the bladder  and multifocal disease.  She has history of T2 high-grade papillary urothelial carcinoma of the renal pelvis with surgical resection in 2021.  Treatment options at this time were discussed which included definitive treatment with radical cystectomy versus bladder sparing approach with radiation and chemotherapy.  The role for neoadjuvant chemotherapy in  the setting of a radical cystectomy was discussed.   The logistics and rationale for using chemotherapy was reviewed today in detail.  Complication associated with cis-platinum and gemcitabine chemotherapy was discussed.  These complications include nausea, vomiting, myelosuppression, fatigue, infusion related complications, renal insufficiency, neutropenia, neutropenic sepsis and rarely serious thrombosis, hospitalization and death.  The benefit would also if he has an excellent response to chemotherapy, curative surgical resection may be attempted.     After discussion today, he is agreeable to proceed after chemo education class.  We will obtain baseline kidney function and determine whether to proceed with cisplatin versus carboplatin based therapy.     2.  IV access: Risks and benefits of using Port-A-Cath versus peripheral veins was discussed today.  Complication associated with Port-A-Cath insertion include bleeding, infection and thrombosis.  After discussing the risks and benefits, she is agreeable to proceed.   3.  Antiemetics: Prescription for Compazine was made available to him.   4.  Renal function surveillance: We will update today and monitor on treatment.   5.  Goals of care: Treatment is curative at this time.  6.  Breast cancer: She is currently on Arimidex and approaching 5 years of therapy.  We will discuss discontinuation in the near future but it has no bearing on the treatment for bladder cancer at this time.   7.  Follow-up: will be in the immediate future to start chemotherapy.  60  minutes were dedicated to this visit.  The time was spent on reviewing laboratory data, imaging studies, discussing treatment options, discussing differential diagnosis and answering questions regarding future plan.    .  A copy of this consult has been forwarded to the requesting physician.

## 2021-07-05 NOTE — Progress Notes (Signed)
START OFF PATHWAY REGIMEN - Bladder   [id number]:Carboplatin + Gemcitabine (5/1,000) q21 Days:   A cycle is every 21 days:     Carboplatin      Gemcitabine   **Always confirm dose/schedule in your pharmacy ordering system**  Patient Characteristics: Pre-Cystectomy or Nonsurgical Candidate (Clinical Staging), cT2-4a, cN0-1, M0, Cystectomy Eligible, All Cisplatin-Based Chemotherapy Contraindicated (CrCl < 50 mL/min or Intolerable Symptoms) Therapeutic Status: Pre-Cystectomy or Nonsurgical Candidate (Clinical Staging) AJCC M Category: cM0 AJCC 8 Stage Grouping: II AJCC T Category: cT2 AJCC N Category: cN0 Intent of Therapy: Curative Intent, Discussed with Patient

## 2021-07-05 NOTE — Research (Signed)
MTG-015 - Tissue and Bodily Fluids: Translational Medicine: Discovery and Evaluation of Biomarkers/Pharmacogenomics for the Diagnosis and Personalized Management of Patients    07/05/2021  INTRO MT 2411: Patient Cynthia [name] was identified by this clinical research coordinator as a potential candidate for the above listed study.  This Clinical Research Coordinator met with Denine Brotz, [id number], on 07/05/21 in a manner and location that ensures patient privacy to discuss participation in the above listed research study.  Patient is Accompanied by her husband and daughter .  A copy of the informed consent document and separate HIPAA Authorization was provided to the patient.  Patient reads, speaks, and understands [place].   Patient was provided with the business card of this Coordinator and encouraged to contact the research team with any questions.  Approximately 10 minutes were spent with the patient reviewing the informed consent documents.  Patient was provided the option of taking informed consent documents home to review and was encouraged to review at their convenience with their support network, including other care providers. Patient took the consent documents home to review. Patient is interested in participation. Plan to have a research visit and lab collection appt next Friday 7/7. Lab appt will occur only if [employer]or is okay with Friday shipment. Patient and her family were thanked for their time and consideration of the above mentioned study. Looking forward to seeing patient next week.   Carol Ada, RT(R)(T) Clinical Research Coordinator

## 2021-07-08 ENCOUNTER — Telehealth: Payer: Self-pay | Admitting: Oncology

## 2021-07-08 NOTE — Telephone Encounter (Signed)
.  Called patient to schedule appointment per 5/30 inbasket, left pt msg

## 2021-07-10 NOTE — Progress Notes (Signed)
Pharmacist Chemotherapy Monitoring - Initial Assessment    Anticipated start date: 07/17/21   The following has been reviewed per standard work regarding the patient's treatment regimen: The patient's diagnosis, treatment plan and drug doses, and organ/hematologic function Lab orders and baseline tests specific to treatment regimen  The treatment plan start date, drug sequencing, and pre-medications Prior authorization status  Patient's documented medication list, including drug-drug interaction screen and prescriptions for anti-emetics and supportive care specific to the treatment regimen The drug concentrations, fluid compatibility, administration routes, and timing of the medications to be used The patient's access for treatment and lifetime cumulative dose history, if applicable  The patient's medication allergies and previous infusion related reactions, if applicable   Changes made to treatment plan:  N/A  Follow up needed:  Pending authorization for treatment    Kennith Center, Pharm.D., CPP 07/10/2021'@10'$ :30 AM

## 2021-07-12 ENCOUNTER — Inpatient Hospital Stay: Payer: Medicare PPO | Admitting: Radiology

## 2021-07-12 ENCOUNTER — Inpatient Hospital Stay: Payer: Medicare PPO | Attending: Oncology

## 2021-07-12 ENCOUNTER — Other Ambulatory Visit: Payer: Self-pay

## 2021-07-12 ENCOUNTER — Inpatient Hospital Stay: Payer: Medicare PPO

## 2021-07-12 DIAGNOSIS — Z853 Personal history of malignant neoplasm of breast: Secondary | ICD-10-CM | POA: Insufficient documentation

## 2021-07-12 DIAGNOSIS — C679 Malignant neoplasm of bladder, unspecified: Secondary | ICD-10-CM

## 2021-07-12 DIAGNOSIS — C689 Malignant neoplasm of urinary organ, unspecified: Secondary | ICD-10-CM

## 2021-07-12 DIAGNOSIS — Z5111 Encounter for antineoplastic chemotherapy: Secondary | ICD-10-CM | POA: Insufficient documentation

## 2021-07-12 LAB — CUP PACEART REMOTE DEVICE CHECK
Date Time Interrogation Session: 20230701231841
Implantable Pulse Generator Implant Date: 20210521

## 2021-07-12 NOTE — Research (Signed)
MTG-015 - Tissue and Bodily Fluids: Translational Medicine: Discovery and Evaluation of Biomarkers/Pharmacogenomics for the Diagnosis and Personalized Management of Patients   This Nurse has reviewed this patient's inclusion and exclusion criteria as a second review and confirms Cynthia [name] is eligible for study participation.  Patient may continue with enrollment.  Marjie Skiff Keasha Malkiewicz, RN, BSN, [facility]  Her  [facility] Clinical Research Nurse [place] [phone]  Pager [phone] 07/12/2021 2:08 PM

## 2021-07-12 NOTE — Research (Unsigned)
MTG-015 - Tissue and Bodily Fluids: Translational Medicine: Discovery and Evaluation of Biomarkers/Pharmacogenomics for the Diagnosis and Personalized Management of Patients    07/12/2021  ELIGIBILITY:  This Coordinator has reviewed this patient's inclusion and exclusion criteria and confirmed Cynthia [name] is eligible for study participation.  Patient will continue with enrollment.  Eligibility confirmed by research nurse and treating investigator, who also agrees that patient should proceed with enrollment.   CONSENT: Patient Cynthia [name] was identified by Dr. Alen Blew as a potential candidate for the above listed study.  This Clinical Research Coordinator met with Kit Brubacher, [id number] on 07/12/21 in a manner and location that ensures patient privacy to discuss participation in the above listed research study.  Patient is Accompanied by her husband .  Patient was previously provided with informed consent documents.  Patient confirmed they have read the informed consent documents.  As outlined in the informed consent form, this Coordinator and Avonne Diosdado discussed the purpose of the research study, the investigational nature of the study, study procedures and requirements for study participation, potential risks and benefits of study participation, as well as alternatives to participation.  This study is not blinded or double-blinded. The patient understands participation is voluntary and they may withdraw from study participation at any time.  This study does not involve randomization.  This study does not involve an investigational drug or device. This study does not involve a placebo. Patient understands enrollment is pending full eligibility review.   Confidentiality and how the patient's information will be used as part of study participation were discussed.  Patient was informed there is reimbursement provided for their time and effort spent on trial participation.  The patient is encouraged  to discuss research study participation with their insurance provider to determine what costs they may incur as part of study participation, including research related injury.    All questions were answered to patient's satisfaction.  The informed consent with embedded HIPAA language was reviewed page by page.  The patient's mental and emotional status is appropriate to provide informed consent, and the patient verbalizes an understanding of study participation.  Patient has agreed to participate in the above listed research study and has voluntarily signed the informed consent version approved date 11/26/2020 and separate HIPAA Authorization, version 5. Dated 12/22/2018  on 07/12/21 at 148PM.  The patient was provided with a copy of the signed informed consent form and separate HIPAA Authorization for their reference.  No study specific procedures were obtained prior to the signing of the informed consent document.  Approximately 20 minutes were spent with the patient reviewing the informed consent documents.  Patient was not requested to complete a Release of Information form.  DATA COLLECTION: After consent was complete, patient history was obtained as follows: Patient meets all eligibility criteria:    YES Patient has infectious disease history:   NO Demographics were collected. Disease status was recorded. Patient had no cancer treatment history for current cancer diagnosis. Patient is treatment naiive.  All current medications were collected along with indication, dose, route of administration, schedule/frequency, and start date.  All personal medical history was obtained. Cancer pathology was recorded.  Smoking and Alcohol history were obtained.   BLOOD COLLECTION: After consent and data collection were complete, patient was escorted to lab, where blood was collected per protocol via fresh venipuncture. Patient tolerated the procedure well. Blood was shipped after collection was obtained.    GIFT CARD: Upon completion of blood collection, patient was provided a $50 VISA  gift card.  Patient was thanked for her time and support in the above mentioned study.  Carol Ada, RT(R)(T) Clinical Research Coordinator

## 2021-07-15 ENCOUNTER — Encounter: Payer: Self-pay | Admitting: Oncology

## 2021-07-15 ENCOUNTER — Ambulatory Visit (INDEPENDENT_AMBULATORY_CARE_PROVIDER_SITE_OTHER): Payer: Medicare PPO

## 2021-07-15 DIAGNOSIS — R55 Syncope and collapse: Secondary | ICD-10-CM | POA: Diagnosis not present

## 2021-07-15 LAB — RESEARCH LABS

## 2021-07-16 ENCOUNTER — Encounter: Payer: Self-pay | Admitting: Oncology

## 2021-07-16 MED FILL — Dexamethasone Sodium Phosphate Inj 100 MG/10ML: INTRAMUSCULAR | Qty: 1 | Status: AC

## 2021-07-16 MED FILL — Fosaprepitant Dimeglumine For IV Infusion 150 MG (Base Eq): INTRAVENOUS | Qty: 5 | Status: AC

## 2021-07-16 NOTE — Progress Notes (Signed)
Called pt to introduce myself as her Arboriculturist and to discuss the [employer].  Unfortunately there aren't any foundations offering copay assistance for her Dx and the type of ins she has.  I left a msg requesting she return my call if she's interested in applying for the [employer].

## 2021-07-16 NOTE — Progress Notes (Signed)
Pt returned my call to inquire about the [place] so I gave her the income requirement but she's overqualified for the grant at this time.

## 2021-07-17 ENCOUNTER — Other Ambulatory Visit: Payer: Self-pay

## 2021-07-17 ENCOUNTER — Inpatient Hospital Stay: Payer: Medicare PPO

## 2021-07-17 ENCOUNTER — Other Ambulatory Visit: Payer: Self-pay | Admitting: Radiology

## 2021-07-17 VITALS — BP 132/71 | HR 60 | Temp 98.1°F | Resp 16 | Wt 166.0 lb

## 2021-07-17 DIAGNOSIS — C679 Malignant neoplasm of bladder, unspecified: Secondary | ICD-10-CM

## 2021-07-17 DIAGNOSIS — C689 Malignant neoplasm of urinary organ, unspecified: Secondary | ICD-10-CM

## 2021-07-17 DIAGNOSIS — Z853 Personal history of malignant neoplasm of breast: Secondary | ICD-10-CM | POA: Diagnosis not present

## 2021-07-17 DIAGNOSIS — Z5111 Encounter for antineoplastic chemotherapy: Secondary | ICD-10-CM | POA: Diagnosis not present

## 2021-07-17 LAB — CMP (CANCER CENTER ONLY)
ALT: 9 U/L (ref 0–44)
AST: 17 U/L (ref 15–41)
Albumin: 3.8 g/dL (ref 3.5–5.0)
Alkaline Phosphatase: 39 U/L (ref 38–126)
Anion gap: 5 (ref 5–15)
BUN: 25 mg/dL — ABNORMAL HIGH (ref 8–23)
CO2: 30 mmol/L (ref 22–32)
Calcium: 9.6 mg/dL (ref 8.9–10.3)
Chloride: 108 mmol/L (ref 98–111)
Creatinine: 1.2 mg/dL — ABNORMAL HIGH (ref 0.44–1.00)
GFR, Estimated: 46 mL/min — ABNORMAL LOW (ref >60)
Glucose, Bld: 102 mg/dL — ABNORMAL HIGH (ref 70–99)
Potassium: 3.8 mmol/L (ref 3.5–5.1)
Sodium: 143 mmol/L (ref 135–145)
Total Bilirubin: 0.5 mg/dL (ref 0.3–1.2)
Total Protein: 6.6 g/dL (ref 6.5–8.1)

## 2021-07-17 LAB — CBC WITH DIFFERENTIAL (CANCER CENTER ONLY)
Abs Immature Granulocytes: 0.01 10*3/uL (ref 0.00–0.07)
Basophils Absolute: 0.1 10*3/uL (ref 0.0–0.1)
Basophils Relative: 1 %
Eosinophils Absolute: 0.2 10*3/uL (ref 0.0–0.5)
Eosinophils Relative: 6 %
HCT: 38 % (ref 36.0–46.0)
Hemoglobin: 12.6 g/dL (ref 12.0–15.0)
Immature Granulocytes: 0 %
Lymphocytes Relative: 38 %
Lymphs Abs: 1.6 10*3/uL (ref 0.7–4.0)
MCH: 30.9 pg (ref 26.0–34.0)
MCHC: 33.2 g/dL (ref 30.0–36.0)
MCV: 93.1 fL (ref 80.0–100.0)
Monocytes Absolute: 0.4 10*3/uL (ref 0.1–1.0)
Monocytes Relative: 9 %
Neutro Abs: 1.9 10*3/uL (ref 1.7–7.7)
Neutrophils Relative %: 46 %
Platelet Count: 250 10*3/uL (ref 150–400)
RBC: 4.08 MIL/uL (ref 3.87–5.11)
RDW: 13.9 % (ref 11.5–15.5)
WBC Count: 4.1 10*3/uL (ref 4.0–10.5)
nRBC: 0 % (ref 0.0–0.2)

## 2021-07-17 MED ORDER — PALONOSETRON HCL INJECTION 0.25 MG/5ML
0.2500 mg | Freq: Once | INTRAVENOUS | Status: AC
  Administered 2021-07-17: 0.25 mg via INTRAVENOUS
  Filled 2021-07-17: qty 5

## 2021-07-17 MED ORDER — SODIUM CHLORIDE 0.9 % IV SOLN: Freq: Once | INTRAVENOUS | Status: AC

## 2021-07-17 MED ORDER — SODIUM CHLORIDE 0.9 % IV SOLN
150.0000 mg | Freq: Once | INTRAVENOUS | Status: AC
  Administered 2021-07-17: 150 mg via INTRAVENOUS
  Filled 2021-07-17: qty 150

## 2021-07-17 MED ORDER — SODIUM CHLORIDE 0.9 % IV SOLN
325.0000 mg | Freq: Once | INTRAVENOUS | Status: AC
  Administered 2021-07-17: 330 mg via INTRAVENOUS
  Filled 2021-07-17: qty 33

## 2021-07-17 MED ORDER — SODIUM CHLORIDE 0.9 % IV SOLN
1000.0000 mg/m2 | Freq: Once | INTRAVENOUS | Status: AC
  Administered 2021-07-17: 1862 mg via INTRAVENOUS
  Filled 2021-07-17: qty 48.97

## 2021-07-17 MED ORDER — SODIUM CHLORIDE 0.9 % IV SOLN
10.0000 mg | Freq: Once | INTRAVENOUS | Status: AC
  Administered 2021-07-17: 10 mg via INTRAVENOUS
  Filled 2021-07-17: qty 10

## 2021-07-17 NOTE — Patient Instructions (Signed)
[facility] ONCOLOGY   Discharge Instructions: Thank you for choosing [facility] to provide your oncology and hematology care.   If you have a lab appointment with the [facility], please go directly to the [facility] and check in at the registration area.   Wear comfortable clothing and clothing appropriate for easy access to any Portacath or PICC line.   We strive to give you quality time with your provider. You may need to reschedule your appointment if you arrive late (15 or more minutes).  Arriving late affects you and other patients whose appointments are after yours.  Also, if you miss three or more appointments without notifying the office, you may be dismissed from the clinic at the provider's discretion.      For prescription refill requests, have your pharmacy contact our office and allow 72 hours for refills to be completed.    Today you received the following chemotherapy and/or immunotherapy agents: gemcitabine and carboplatin      To help prevent nausea and vomiting after your treatment, we encourage you to take your nausea medication as directed.  BELOW ARE SYMPTOMS THAT SHOULD BE REPORTED IMMEDIATELY: *FEVER GREATER THAN 100.4 F (38 C) OR HIGHER *CHILLS OR SWEATING *NAUSEA AND VOMITING THAT IS NOT CONTROLLED WITH YOUR NAUSEA MEDICATION *UNUSUAL SHORTNESS OF BREATH *UNUSUAL BRUISING OR BLEEDING *URINARY PROBLEMS (pain or burning when urinating, or frequent urination) *BOWEL PROBLEMS (unusual diarrhea, constipation, pain near the anus) TENDERNESS IN MOUTH AND THROAT WITH OR WITHOUT PRESENCE OF ULCERS (sore throat, sores in mouth, or a toothache) UNUSUAL RASH, SWELLING OR PAIN  UNUSUAL VAGINAL DISCHARGE OR ITCHING   Items with * indicate a potential emergency and should be followed up as soon as possible or go to the Emergency Department if any problems should occur.  Please show the CHEMOTHERAPY ALERT CARD or IMMUNOTHERAPY ALERT  CARD at check-in to the Emergency Department and triage nurse.  Should you have questions after your visit or need to cancel or reschedule your appointment, please contact [facility]  Dept: [phone]  and follow the prompts.  Office hours are 8:00 a.m. to 4:30 p.m. Monday - Friday. Please note that voicemails left after 4:00 p.m. may not be returned until the following business day.  We are closed weekends and major holidays. You have access to a nurse at all times for urgent questions. Please call the main number to the clinic Dept: [phone] and follow the prompts.   For any non-urgent questions, you may also contact your provider using MyChart. We now offer e-Visits for anyone 70 and older to request care online for non-urgent symptoms. For details visit [email or web address].   Also download the MyChart app! Go to the app store, search "MyChart", open the app, select [facility], and log in with your MyChart username and password.  Masks are optional in the cancer centers. If you would like for your care team to wear a mask while they are taking care of you, please let them know. For doctor visits, patients may have with them one support person who is at least 78 years old. At this time, visitors are not allowed in the infusion area.

## 2021-07-17 NOTE — Progress Notes (Signed)
Confirmed Carbo + Gemzar treatment plan with MD.  Acquanetta Belling, RPH, BCPS, BCOP 07/17/2021 2:34 PM

## 2021-07-18 ENCOUNTER — Encounter (HOSPITAL_COMMUNITY): Payer: Self-pay

## 2021-07-18 ENCOUNTER — Ambulatory Visit (HOSPITAL_COMMUNITY)
Admission: RE | Admit: 2021-07-18 | Discharge: 2021-07-18 | Disposition: A | Discharge status: Home / Self Care | Payer: Medicare PPO | Source: Ambulatory Visit | Attending: Oncology | Admitting: Oncology

## 2021-07-18 ENCOUNTER — Other Ambulatory Visit: Payer: Self-pay

## 2021-07-18 ENCOUNTER — Other Ambulatory Visit: Payer: Self-pay | Admitting: Oncology

## 2021-07-18 ENCOUNTER — Telehealth: Payer: Self-pay | Admitting: *Deleted

## 2021-07-18 DIAGNOSIS — K219 Gastro-esophageal reflux disease without esophagitis: Secondary | ICD-10-CM | POA: Diagnosis not present

## 2021-07-18 DIAGNOSIS — Z95810 Presence of automatic (implantable) cardiac defibrillator: Secondary | ICD-10-CM | POA: Insufficient documentation

## 2021-07-18 DIAGNOSIS — C679 Malignant neoplasm of bladder, unspecified: Secondary | ICD-10-CM

## 2021-07-18 DIAGNOSIS — Z452 Encounter for adjustment and management of vascular access device: Secondary | ICD-10-CM | POA: Diagnosis not present

## 2021-07-18 DIAGNOSIS — I480 Paroxysmal atrial fibrillation: Secondary | ICD-10-CM | POA: Insufficient documentation

## 2021-07-18 DIAGNOSIS — C689 Malignant neoplasm of urinary organ, unspecified: Secondary | ICD-10-CM | POA: Insufficient documentation

## 2021-07-18 DIAGNOSIS — E119 Type 2 diabetes mellitus without complications: Secondary | ICD-10-CM | POA: Insufficient documentation

## 2021-07-18 DIAGNOSIS — E039 Hypothyroidism, unspecified: Secondary | ICD-10-CM | POA: Diagnosis not present

## 2021-07-18 DIAGNOSIS — I10 Essential (primary) hypertension: Secondary | ICD-10-CM | POA: Diagnosis not present

## 2021-07-18 HISTORY — PX: IR IMAGING GUIDED PORT INSERTION: IMG5740

## 2021-07-18 MED ORDER — MIDAZOLAM HCL 2 MG/2ML IJ SOLN
INTRAMUSCULAR | Status: AC | PRN
  Administered 2021-07-18 (×2): 1 mg via INTRAVENOUS

## 2021-07-18 MED ORDER — LIDOCAINE-EPINEPHRINE 1 %-1:100000 IJ SOLN
INTRAMUSCULAR | Status: AC
  Administered 2021-07-18: 20 mL
  Filled 2021-07-18: qty 1

## 2021-07-18 MED ORDER — MIDAZOLAM HCL 2 MG/2ML IJ SOLN
INTRAMUSCULAR | Status: AC
  Filled 2021-07-18: qty 2

## 2021-07-18 MED ORDER — FENTANYL CITRATE (PF) 100 MCG/2ML IJ SOLN
INTRAMUSCULAR | Status: AC
  Filled 2021-07-18: qty 2

## 2021-07-18 MED ORDER — HEPARIN SOD (PORK) LOCK FLUSH 100 UNIT/ML IV SOLN
INTRAVENOUS | Status: AC
  Administered 2021-07-18: 5 [IU]
  Filled 2021-07-18: qty 5

## 2021-07-18 MED ORDER — HEPARIN SOD (PORK) LOCK FLUSH 100 UNIT/ML IV SOLN
INTRAVENOUS | Status: AC | PRN
  Administered 2021-07-18: 500 [IU] via INTRAVENOUS

## 2021-07-18 MED ORDER — FENTANYL CITRATE (PF) 100 MCG/2ML IJ SOLN
INTRAMUSCULAR | Status: AC | PRN
  Administered 2021-07-18 (×2): 50 ug via INTRAVENOUS

## 2021-07-18 MED ORDER — SODIUM CHLORIDE 0.9 % IV SOLN: INTRAVENOUS | Status: DC

## 2021-07-18 NOTE — Consult Note (Signed)
Chief Complaint: Patient was seen in consultation today for Port-A-Cath placement  Referring Physician(s): Wyatt Portela  Supervising Physician: Ruthann Cancer  Patient Status: [name]  History of Present Illness: Cynthia [name] is a 76 y.o. 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  She presents today for Port-A-Cath placement to assist with treatment.  Past Medical History:  Diagnosis Date   AICD (automatic cardioverter/defibrillator) present    [employer]   Anticoagulant long-term use    eliquis--- managed by cardiology   Anxiety    Arthritis    Bradycardia    Cancer of kidney ([place])    left   DDD (degenerative disc disease), lumbar    Depression    Dyspnea    ON EXERTION   First degree heart block    Genetic testing 06/25/2016   Ms. Teachey underwent genetic counseling and testing for hereditary cancer syndromes on 06/17/2016. Her results were negative for mutations in all 46 genes analyzed by [facility]'s 46-gene Common Hereditary Cancers Panel. Genes analyzed include: APC, ATM, AXIN2, BARD1, BMPR1A, BRCA1, BRCA2, BRIP1, CDH1, CDKN2A, CHEK2, CTNNA1, DICER1, EPCAM, GREM1, HOXB13, KIT, MEN1, MLH1, MSH2, MSH3, MSH6, MUTYH, NBN,   GERD (gastroesophageal reflux disease)    occasional,  takes pepcid   Hematuria    History of radiation therapy 05/22/2016 to 06/20/2016   right breast cancer   Hypertension    followd by pcp---  (02-22-2019 per  pt never had stress test)   Hypothyroidism    followed by pcp   IBS (irritable bowel syndrome)    Incomplete right bundle branch block    Insomnia    Malignant neoplasm of upper-inner quadrant of right breast in female, estrogen receptor positive [facility]) oncologist--- dr [name]   dx 03/ 2018,  Stage IA, IDC, ER/PR +;  04-01-2016 s/p  right breast lumpectomy w/ node dissection's;  completed radiation 06-20-2016   MVP (mitral valve prolapse)    per echo 12-11-2018 in epic, mild    NSVT (nonsustained ventricular tachycardia) ([place]) followed by cardiology   12-10-2018  hospital admission ,  refer to discharge note 12-14-2018 for treatement   PAF (paroxysmal atrial fibrillation) [facility]) primary cardiologist--- dr Margaretann Loveless   newly dx 12-10-2018  admission in epic, Afib w/ RVR and NSVT;   in epic TTE 12-11-2018 showed ef 50-55%, mild MVP,  mild AV sclerosis without stenosis;   Cardiac MRI 12-13-2018 in epic   Renal mass, left    pelvis    Restless leg syndrome    occasional   Type 2 diabetes mellitus ([place])    followed by pcp---  (02-22-2019 does not check blood sugar's)    Past Surgical History:  Procedure Laterality Date   BREAST LUMPECTOMY WITH RADIOACTIVE SEED AND SENTINEL LYMPH NODE BIOPSY Right 04/01/2016   Procedure: RADIOACTIVE SEED GUIDED RIGHT BREAST LUMPECTOMY WITH RIGHT AXILLARY SENTINEL LYMPH NODE BIOPSY.;  Surgeon: Fanny Skates, MD;  Location: [facility];  Service: General;  Laterality: Right;   CARDIAC CATHETERIZATION  12/11/2018   CATARACT EXTRACTION W/ INTRAOCULAR LENS  IMPLANT, BILATERAL  1980s   COLONOSCOPY     CYSTOSCOPY W/ RETROGRADES  Right 06/05/2021   Procedure: CYSTOSCOPY WITH RETROGRADE PYELOGRAM;  Surgeon: Alexis Frock, MD;  Location: [facility];  Service: Urology;  Laterality: Right;   CYSTOSCOPY/RETROGRADE/URETEROSCOPY N/A 03/02/2019   Procedure: CYSTOSCOPY/LEFT RETROGRADE/URETEROSCOPY/  BIOPSY/  STENT;  Surgeon: Ceasar Mons, MD;  Location: [facility];  Service: Urology;  Laterality: N/A;   INCISIONAL HERNIA REPAIR N/A 03/29/2020   Procedure: REPAIR OF INCISIONAL HERNIA WITH MESH;  Surgeon: Erroll Luna, MD;  Location: [facility];  Service: General;  Laterality: N/A;   INSERTION OF ICD     [place] LAPAROSCOPIC NEPHROURETERECTOMY Left 04/27/2019   Procedure: XI ROBOT ASSITED LAPAROSCOPIC NEPHROURETERECTOMY;  Surgeon: Alexis Frock, MD;  Location: [facility];  Service: Urology;  Laterality: Left;  3.5 HRS   TONSILLECTOMY  age 61   TRANSURETHRAL RESECTION OF BLADDER TUMOR N/A 06/05/2021   Procedure: TRANSURETHRAL RESECTION OF BLADDER TUMOR (TURBT);  Surgeon: Alexis Frock, MD;  Location: [facility];  Service: Urology;  Laterality: N/A;    Allergies: Patient has no known allergies.  Medications: Prior to Admission medications   Medication Sig Start Date End Date Taking? Authorizing Provider  acetaminophen (TYLENOL) 500 MG tablet Take 1,000 mg by mouth at bedtime.    [provider]  ALPRAZolam Duanne Moron) 1 MG tablet Take 2 mg by mouth at bedtime. 12/09/18   [provider]  amiodarone (PACERONE) 100 MG tablet Take 1 tablet (100 mg total) by mouth daily. 06/26/21   Deboraha Sprang, MD  anastrozole (ARIMIDEX) 1 MG tablet TAKE 1 TABLET BY MOUTH EVERY DAY 10/08/20   Magrinat, [name], MD  apixaban (ELIQUIS) 5 MG TABS tablet TAKE 1 TABLET BY MOUTH TWICE A DAY 01/18/21   Deboraha Sprang, MD  Cholecalciferol (VITAMIN D) 50 MCG (2000 UT) CAPS Take 2,000 Units by mouth daily.    [provider]  Choline Fenofibrate (FENOFIBRIC ACID) 135 MG CPDR Take 135 mg by mouth daily.  01/16/16   [provider]  ferrous sulfate 325 (65 FE) MG tablet Take 325 mg by mouth at bedtime.    [provider]  HYDROcodone-acetaminophen (NORCO/VICODIN) 5-325 MG tablet Take 1 tablet by mouth every 6 (six) hours as needed for moderate pain or severe pain (post-operatively). 06/05/21  06/05/22  Alexis Frock, MD  levothyroxine (SYNTHROID, LEVOTHROID) 112 MCG tablet Take 112 mcg by mouth at bedtime.  03/13/16   [provider]  lidocaine (LIDODERM) 5 % Place 1 patch onto the skin daily as needed. Remove & Discard patch within 12 hours or as directed by MD Patient not taking: Reported on 05/28/2021 03/23/21   Wynona Dove A, DO  lidocaine-prilocaine (EMLA) cream Apply 1 Application topically as needed. 07/05/21   Wyatt Portela, MD  mirtazapine (REMERON) 15 MG tablet Take 15 mg by mouth at bedtime. 02/14/21   [provider]  pantoprazole (PROTONIX) 40 MG tablet Take 40 mg by mouth 2 (two) times daily. 05/20/21   [provider]  prochlorperazine (COMPAZINE) 10 MG tablet Take 1 tablet (10 mg total) by mouth every 6 (six) hours as needed for nausea or vomiting. 07/05/21   Wyatt Portela, MD  psyllium (REGULOID) 0.52 g capsule Take 3.64 g by mouth daily. 7 caps daily    [provider]  senna-docusate (SENOKOT-S) 8.6-50 MG tablet Take 1 tablet by mouth 2 (two) times daily. While taking strong pain meds to prevent constipation 06/05/21   Alexis Frock, MD  Family History  Problem Relation Age of Onset   Breast cancer Sister 23       d.54   Leukemia Brother 67   Cervical cancer Sister 51   Ovarian cancer Maternal Aunt 92       d.92s   Breast cancer Other 46    Social History   Socioeconomic History   Marital status: Married    Spouse name: Not on file   Number of children: Not on file   Years of education: Not on file   Highest education level: Not on file  Occupational History   Not on file  Tobacco Use   Smoking status: Never   Smokeless tobacco: Never  Vaping Use   Vaping Use: Never used  Substance and Sexual Activity   Alcohol use: Yes    Comment: socially   Drug use: Never   Sexual activity: Not on file  Other Topics Concern   Not on file  Social History Narrative   Not on file   Social Determinants of Health    Financial Resource Strain: Not on file  Food Insecurity: Not on file  Transportation Needs: Not on file  Physical Activity: Not on file  Stress: Not on file  Social Connections: Not on file      Review of Systems denies fever, headache, chest pain, dyspnea, abdominal pain, nausea, vomiting or visible bleeding.  She does have chronic cough and back pain.  Vital Signs: BP 128/70   Pulse 65   Temp (!) 97.4 F (36.3 C) (Oral)   Resp 18   SpO2 98%      Physical Exam awake, alert.  Chest clear to auscultation bilaterally.  Heart with regular rate and rhythm.  Abdomen soft, positive bowel sounds, nontender, anterior abdominal wall hernia noted; extremities with full range of motion  Imaging: CUP PACEART REMOTE DEVICE CHECK  Result Date: 07/12/2021 ILR summary report received. Battery status OK. Normal device function. No new symptom, tachy, brady, or pause episodes. 8.1% AF burden, trend stable, on Eliquis and Amiodarone. Monthly summary reports and ROV/PRN   Labs:  CBC: Recent Labs    04/18/21 1540 06/04/21 1358 07/05/21 1502 07/17/21 1227  WBC 17.0* 4.4 4.3 4.1  HGB 14.6 13.4 12.8 12.6  HCT 44.2 41.5 39.1 38.0  PLT 313 280 259 250    COAGS: No results for input(s): "INR", "APTT" in the last 8760 hours.  BMP: Recent Labs    04/18/21 1540 06/04/21 1358 07/05/21 1502 07/17/21 1227  NA 138 143 142 143  K 3.9 4.1 3.8 3.8  CL 102 109 106 108  CO2 25 27 30 30   GLUCOSE 101* 68* 69* 102*  BUN 27* 23 22 25*  CALCIUM 9.5 9.4 9.5 9.6  CREATININE 1.36* 1.24* 1.22* 1.20*  GFRNONAA 40* 45* 45* 46*    LIVER FUNCTION TESTS: Recent Labs    02/26/21 1647 03/23/21 1146 07/05/21 1502 07/17/21 1227  BILITOT 0.3 0.4 0.4 0.5  AST 20 18 17 17   ALT 10 10 9 9   ALKPHOS 66 42 40 39  PROT 7.4 7.0 6.7 6.6  ALBUMIN 4.4 3.8 3.7 3.8    TUMOR MARKERS: No results for input(s): "AFPTM", "CEA", "CA199", "CHROMGRNA" in the last 8760 hours.  Assessment and Plan: 76 y.o.  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  She presents today for Port-A-Cath placement to assist with treatment.Risks and benefits of image guided port-a-catheter placement was discussed with the patient including, but not limited to bleeding, infection, pneumothorax, or fibrin sheath development and need for additional procedures.  All of the patient's questions were answered, patient is agreeable to proceed. Consent signed and in chart.    Thank you for this interesting consult.  I greatly enjoyed meeting Cynthia [name] and look forward to participating in their care.  A copy of this report was sent to the requesting provider on this date.  Electronically Signed: D. Rowe Robert, PA-C 07/18/2021, 12:12 PM   I spent a total of  25 minutes   in face to face in clinical consultation, greater than 50% of which was counseling/coordinating care for Port-A-Cath placement

## 2021-07-18 NOTE — Telephone Encounter (Signed)
-----   Message from Clyda Hurdle, RN sent at 07/17/2021  4:17 PM EDT ----- Regarding: 1st Time Callback Dr Alen Blew 1st Time Gemzar/Carbo follow up Dr Hazeline Junker patient

## 2021-07-18 NOTE — Discharge Instructions (Signed)
For questions /concerns may call Interventional Radiology at [phone] or  Interventional Radiology clinic [phone]   You may remove your dressing and shower tomorrow afternoon  DO NOT use EMLA cream for 2 weeks after port placement as the cream will remove surgical glue on your incision.   For questions /concerns may call Interventional Radiology at [phone] or  Interventional Radiology clinic [phone]   You may remove your dressing and shower tomorrow afternoon  DO NOT use EMLA cream for 2 weeks after port placement as the cream will remove surgical glue on your incision.    Implanted Port Insertion, Care After This sheet gives you information about how to care for yourself after your procedure. Your health care provider may also give you more specific instructions. If you have problems or questions, contact your health careprovider. What can I expect after the procedure? After the procedure, it is common to have: Discomfort at the port insertion site. Bruising on the skin over the port. This should improve over 3-4 days. Follow these instructions at home: [facility] care After your port is placed, you will get a manufacturer's information card. The card has information about your port. Keep this card with you at all times. Take care of the port as told by your health care provider. Ask your health care provider if you or a family member can get training for taking care of the port at home. A home health care nurse may also take care of the port. Make sure to remember what type of port you have. Incision care Follow instructions from your health care provider about how to take care of your port insertion site. Make sure you: Wash your hands with soap and water before and after you change your bandage (dressing). If soap and water are not available, use hand sanitizer. Change your dressing as told by your health care provider. Leave skin glue, or adhesive strips in place. These  skin closures may need to stay in place for 2 weeks or longer.  Check your port insertion site every day for signs of infection. Check for:      - Redness, swelling, or pain.                     - Fluid or blood.      - Warmth.      - Pus or a bad smell. Activity Return to your normal activities as told by your health care provider. Ask your health care provider what activities are safe for you. Do not lift anything that is heavier than 10 lb (4.5 kg), or the limit that you are told, until your health care provider says that it is safe. General instructions Take over-the-counter and prescription medicines only as told by your health care provider. Do not take baths, swim, or use a hot tub until your health care provider approves. Ask your health care provider if you may take showers. You may only be allowed to take sponge baths. Do not drive for 24 hours if you were given a sedative during your procedure. Wear a medical alert bracelet in case of an emergency. This will tell any health care providers that you have a port. Keep all follow-up visits as told by your health care provider. This is important. Contact a health care provider if: You cannot flush your port with saline as directed, or you cannot draw blood from the port. You have a fever or chills. You have redness, swelling, or pain  around your port insertion site. You have fluid or blood coming from your port insertion site. Your port insertion site feels warm to the touch. You have pus or a bad smell coming from the port insertion site. Get help right away if: You have chest pain or shortness of breath. You have bleeding from your port that you cannot control. Summary Take care of the port as told by your health care provider. Keep the manufacturer's information card with you at all times. Change your dressing as told by your health care provider. Contact a health care provider if you have a fever or chills or if you have  redness, swelling, or pain around your port insertion site. Keep all follow-up visits as told by your health care provider. This information is not intended to replace advice given to you by your health care provider. Make sure you discuss any questions you have with your healthcare provider.    Moderate Conscious Sedation, Adult, Care After This sheet gives you information about how to care for yourself after your procedure. Your health care provider may also give you more specific instructions. If you have problems or questions, contact your health careprovider. What can I expect after the procedure? After the procedure, it is common to have: Sleepiness for several hours. Impaired judgment for several hours. Difficulty with balance. Vomiting if you eat too soon. Follow these instructions at home: For the time period you were told by your health care provider: Rest. Do not participate in activities where you could fall or become injured. Do not drive or use machinery. Do not drink alcohol. Do not take sleeping pills or medicines that cause drowsiness. Do not make important decisions or sign legal documents. Do not take care of children on your own. Eating and drinking  Follow the diet recommended by your health care provider. Drink enough fluid to keep your urine pale yellow. If you vomit: Drink water, juice, or soup when you can drink without vomiting. Make sure you have little or no nausea before eating solid foods.  General instructions Take over-the-counter and prescription medicines only as told by your health care provider. Have a responsible adult stay with you for the time you are told. It is important to have someone help care for you until you are awake and alert. Do not smoke. Keep all follow-up visits as told by your health care provider. This is important. Contact a health care provider if: You are still sleepy or having trouble with balance after 24 hours. You  feel light-headed. You keep feeling nauseous or you keep vomiting. You develop a rash. You have a fever. You have redness or swelling around the IV site. Get help right away if: You have trouble breathing. You have new-onset confusion at home. Summary After the procedure, it is common to feel sleepy, have impaired judgment, or feel nauseous if you eat too soon. Rest after you get home. Know the things you should not do after the procedure. Follow the diet recommended by your health care provider and drink enough fluid to keep your urine pale yellow. Get help right away if you have trouble breathing or new-onset confusion at home. This information is not intended to replace advice given to you by your health care provider. Make sure you discuss any questions you have with your healthcare provider. Document Revised: 04/22/2019 Document Reviewed: 11/18/2018 Elsevier Patient Education  2022 [employer].

## 2021-07-22 ENCOUNTER — Ambulatory Visit (HOSPITAL_COMMUNITY): Payer: Medicare PPO

## 2021-07-23 ENCOUNTER — Telehealth: Payer: Self-pay | Admitting: Physician Assistant

## 2021-07-23 NOTE — Telephone Encounter (Signed)
Rescheduled August appointment per provider pal, called patient regarding new rescheduled appointment. Patient is notified.

## 2021-07-24 ENCOUNTER — Inpatient Hospital Stay: Payer: Medicare PPO

## 2021-07-24 DIAGNOSIS — C689 Malignant neoplasm of urinary organ, unspecified: Secondary | ICD-10-CM

## 2021-07-24 DIAGNOSIS — Z853 Personal history of malignant neoplasm of breast: Secondary | ICD-10-CM | POA: Diagnosis not present

## 2021-07-24 DIAGNOSIS — C679 Malignant neoplasm of bladder, unspecified: Secondary | ICD-10-CM | POA: Diagnosis not present

## 2021-07-24 DIAGNOSIS — Z5111 Encounter for antineoplastic chemotherapy: Secondary | ICD-10-CM | POA: Diagnosis not present

## 2021-07-24 DIAGNOSIS — Z95828 Presence of other vascular implants and grafts: Secondary | ICD-10-CM

## 2021-07-24 LAB — CMP (CANCER CENTER ONLY)
ALT: 17 U/L (ref 0–44)
AST: 25 U/L (ref 15–41)
Albumin: 3.6 g/dL (ref 3.5–5.0)
Alkaline Phosphatase: 41 U/L (ref 38–126)
Anion gap: 6 (ref 5–15)
BUN: 25 mg/dL — ABNORMAL HIGH (ref 8–23)
CO2: 28 mmol/L (ref 22–32)
Calcium: 9.5 mg/dL (ref 8.9–10.3)
Chloride: 109 mmol/L (ref 98–111)
Creatinine: 1.11 mg/dL — ABNORMAL HIGH (ref 0.44–1.00)
GFR, Estimated: 51 mL/min — ABNORMAL LOW (ref >60)
Glucose, Bld: 140 mg/dL — ABNORMAL HIGH (ref 70–99)
Potassium: 3.9 mmol/L (ref 3.5–5.1)
Sodium: 143 mmol/L (ref 135–145)
Total Bilirubin: 0.4 mg/dL (ref 0.3–1.2)
Total Protein: 6.8 g/dL (ref 6.5–8.1)

## 2021-07-24 LAB — CBC WITH DIFFERENTIAL (CANCER CENTER ONLY)
Abs Immature Granulocytes: 0 10*3/uL (ref 0.00–0.07)
Basophils Absolute: 0 10*3/uL (ref 0.0–0.1)
Basophils Relative: 1 %
Eosinophils Absolute: 0 10*3/uL (ref 0.0–0.5)
Eosinophils Relative: 1 %
HCT: 34.4 % — ABNORMAL LOW (ref 36.0–46.0)
Hemoglobin: 11.3 g/dL — ABNORMAL LOW (ref 12.0–15.0)
Immature Granulocytes: 0 %
Lymphocytes Relative: 52 %
Lymphs Abs: 1.2 10*3/uL (ref 0.7–4.0)
MCH: 30.7 pg (ref 26.0–34.0)
MCHC: 32.8 g/dL (ref 30.0–36.0)
MCV: 93.5 fL (ref 80.0–100.0)
Monocytes Absolute: 0.1 10*3/uL (ref 0.1–1.0)
Monocytes Relative: 4 %
Neutro Abs: 0.9 10*3/uL — ABNORMAL LOW (ref 1.7–7.7)
Neutrophils Relative %: 42 %
Platelet Count: 155 10*3/uL (ref 150–400)
RBC: 3.68 MIL/uL — ABNORMAL LOW (ref 3.87–5.11)
RDW: 13.2 % (ref 11.5–15.5)
WBC Count: 2.3 10*3/uL — ABNORMAL LOW (ref 4.0–10.5)
nRBC: 0 % (ref 0.0–0.2)

## 2021-07-24 MED ORDER — SODIUM CHLORIDE 0.9% FLUSH
10.0000 mL | INTRAVENOUS | Status: DC | PRN
  Administered 2021-07-24: 10 mL

## 2021-07-24 MED ORDER — SODIUM CHLORIDE 0.9% FLUSH
10.0000 mL | Freq: Once | INTRAVENOUS | Status: AC
  Administered 2021-07-24: 10 mL

## 2021-07-24 MED ORDER — HEPARIN SOD (PORK) LOCK FLUSH 100 UNIT/ML IV SOLN
500.0000 [IU] | Freq: Once | INTRAVENOUS | Status: AC | PRN
  Administered 2021-07-24: 500 [IU]

## 2021-07-29 ENCOUNTER — Other Ambulatory Visit: Payer: Self-pay

## 2021-07-29 ENCOUNTER — Encounter (HOSPITAL_COMMUNITY)
Admission: RE | Admit: 2021-07-29 | Discharge: 2021-07-29 | Disposition: A | Discharge status: Home / Self Care | Payer: Medicare PPO | Source: Ambulatory Visit | Attending: Oncology | Admitting: Oncology

## 2021-07-29 DIAGNOSIS — K439 Ventral hernia without obstruction or gangrene: Secondary | ICD-10-CM | POA: Insufficient documentation

## 2021-07-29 DIAGNOSIS — D259 Leiomyoma of uterus, unspecified: Secondary | ICD-10-CM | POA: Insufficient documentation

## 2021-07-29 DIAGNOSIS — C689 Malignant neoplasm of urinary organ, unspecified: Secondary | ICD-10-CM

## 2021-07-29 DIAGNOSIS — K5730 Diverticulosis of large intestine without perforation or abscess without bleeding: Secondary | ICD-10-CM | POA: Insufficient documentation

## 2021-07-29 DIAGNOSIS — C678 Malignant neoplasm of overlapping sites of bladder: Secondary | ICD-10-CM | POA: Diagnosis not present

## 2021-07-29 DIAGNOSIS — I700 Atherosclerosis of aorta: Secondary | ICD-10-CM | POA: Insufficient documentation

## 2021-07-29 DIAGNOSIS — Z905 Acquired absence of kidney: Secondary | ICD-10-CM | POA: Insufficient documentation

## 2021-07-29 DIAGNOSIS — C679 Malignant neoplasm of bladder, unspecified: Secondary | ICD-10-CM

## 2021-07-29 LAB — GLUCOSE, CAPILLARY: Glucose-Capillary: 88 mg/dL (ref 70–99)

## 2021-07-29 MED ORDER — FLUDEOXYGLUCOSE F - 18 (FDG) INJECTION
8.2000 | Freq: Once | INTRAVENOUS | Status: AC
  Administered 2021-07-29: 8.2 via INTRAVENOUS

## 2021-07-31 ENCOUNTER — Telehealth: Payer: Self-pay

## 2021-07-31 NOTE — Telephone Encounter (Signed)
Pt called to report that after her first chemo tx she has been waking at night with pain in her hands. 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 The pain has gotten much worse.  Reviewed potential side effects from carboplatin which can include peripheral neuropathy.   Encouraged pt to make apt with hand specialist.   Patient will discuss her pain with Dr. Alen Blew at next clinic visit on 08/07/21.

## 2021-08-05 ENCOUNTER — Telehealth: Payer: Self-pay | Admitting: Nurse Practitioner

## 2021-08-05 ENCOUNTER — Ambulatory Visit (INDEPENDENT_AMBULATORY_CARE_PROVIDER_SITE_OTHER): Payer: Medicare PPO | Admitting: Nurse Practitioner

## 2021-08-05 DIAGNOSIS — Z01810 Encounter for preprocedural cardiovascular examination: Secondary | ICD-10-CM

## 2021-08-05 NOTE — Progress Notes (Signed)
Error

## 2021-08-05 NOTE — Telephone Encounter (Signed)
Patient contacted for preoperative telephone call however patient had previously canceled her upcoming endoscopy appointment.  Patient was advised that no charge will be made for today's call.

## 2021-08-06 ENCOUNTER — Other Ambulatory Visit: Payer: Self-pay

## 2021-08-06 MED FILL — Dexamethasone Sodium Phosphate Inj 100 MG/10ML: INTRAMUSCULAR | Qty: 1 | Status: AC

## 2021-08-06 MED FILL — Fosaprepitant Dimeglumine For IV Infusion 150 MG (Base Eq): INTRAVENOUS | Qty: 5 | Status: AC

## 2021-08-07 ENCOUNTER — Inpatient Hospital Stay: Payer: Medicare PPO | Attending: Oncology | Admitting: Oncology

## 2021-08-07 ENCOUNTER — Inpatient Hospital Stay: Payer: Medicare PPO

## 2021-08-07 ENCOUNTER — Other Ambulatory Visit: Payer: Self-pay

## 2021-08-07 VITALS — BP 166/72 | HR 65 | Temp 97.2°F | Resp 15 | Wt 167.1 lb

## 2021-08-07 DIAGNOSIS — Z5111 Encounter for antineoplastic chemotherapy: Secondary | ICD-10-CM | POA: Diagnosis not present

## 2021-08-07 DIAGNOSIS — G2581 Restless legs syndrome: Secondary | ICD-10-CM | POA: Diagnosis not present

## 2021-08-07 DIAGNOSIS — K5730 Diverticulosis of large intestine without perforation or abscess without bleeding: Secondary | ICD-10-CM | POA: Diagnosis not present

## 2021-08-07 DIAGNOSIS — Z923 Personal history of irradiation: Secondary | ICD-10-CM | POA: Insufficient documentation

## 2021-08-07 DIAGNOSIS — Z7901 Long term (current) use of anticoagulants: Secondary | ICD-10-CM | POA: Diagnosis not present

## 2021-08-07 DIAGNOSIS — Z79811 Long term (current) use of aromatase inhibitors: Secondary | ICD-10-CM | POA: Diagnosis not present

## 2021-08-07 DIAGNOSIS — K219 Gastro-esophageal reflux disease without esophagitis: Secondary | ICD-10-CM | POA: Diagnosis not present

## 2021-08-07 DIAGNOSIS — C679 Malignant neoplasm of bladder, unspecified: Secondary | ICD-10-CM

## 2021-08-07 DIAGNOSIS — D701 Agranulocytosis secondary to cancer chemotherapy: Secondary | ICD-10-CM | POA: Diagnosis not present

## 2021-08-07 DIAGNOSIS — Z79899 Other long term (current) drug therapy: Secondary | ICD-10-CM | POA: Diagnosis not present

## 2021-08-07 DIAGNOSIS — N390 Urinary tract infection, site not specified: Secondary | ICD-10-CM | POA: Diagnosis not present

## 2021-08-07 DIAGNOSIS — T451X5A Adverse effect of antineoplastic and immunosuppressive drugs, initial encounter: Secondary | ICD-10-CM | POA: Diagnosis not present

## 2021-08-07 DIAGNOSIS — E119 Type 2 diabetes mellitus without complications: Secondary | ICD-10-CM | POA: Insufficient documentation

## 2021-08-07 DIAGNOSIS — I341 Nonrheumatic mitral (valve) prolapse: Secondary | ICD-10-CM | POA: Diagnosis not present

## 2021-08-07 DIAGNOSIS — E039 Hypothyroidism, unspecified: Secondary | ICD-10-CM | POA: Diagnosis not present

## 2021-08-07 DIAGNOSIS — I480 Paroxysmal atrial fibrillation: Secondary | ICD-10-CM | POA: Diagnosis not present

## 2021-08-07 DIAGNOSIS — I700 Atherosclerosis of aorta: Secondary | ICD-10-CM | POA: Diagnosis not present

## 2021-08-07 DIAGNOSIS — Z170 Estrogen receptor positive status [ER+]: Secondary | ICD-10-CM | POA: Diagnosis not present

## 2021-08-07 DIAGNOSIS — C50211 Malignant neoplasm of upper-inner quadrant of right female breast: Secondary | ICD-10-CM | POA: Insufficient documentation

## 2021-08-07 DIAGNOSIS — I10 Essential (primary) hypertension: Secondary | ICD-10-CM | POA: Diagnosis not present

## 2021-08-07 DIAGNOSIS — K439 Ventral hernia without obstruction or gangrene: Secondary | ICD-10-CM | POA: Diagnosis not present

## 2021-08-07 DIAGNOSIS — Z95828 Presence of other vascular implants and grafts: Secondary | ICD-10-CM

## 2021-08-07 DIAGNOSIS — C689 Malignant neoplasm of urinary organ, unspecified: Secondary | ICD-10-CM

## 2021-08-07 LAB — CMP (CANCER CENTER ONLY)
ALT: 11 U/L (ref 0–44)
AST: 19 U/L (ref 15–41)
Albumin: 4 g/dL (ref 3.5–5.0)
Alkaline Phosphatase: 48 U/L (ref 38–126)
Anion gap: 4 — ABNORMAL LOW (ref 5–15)
BUN: 23 mg/dL (ref 8–23)
CO2: 31 mmol/L (ref 22–32)
Calcium: 9.4 mg/dL (ref 8.9–10.3)
Chloride: 105 mmol/L (ref 98–111)
Creatinine: 1.02 mg/dL — ABNORMAL HIGH (ref 0.44–1.00)
GFR, Estimated: 56 mL/min — ABNORMAL LOW (ref >60)
Glucose, Bld: 85 mg/dL (ref 70–99)
Potassium: 4 mmol/L (ref 3.5–5.1)
Sodium: 140 mmol/L (ref 135–145)
Total Bilirubin: 0.4 mg/dL (ref 0.3–1.2)
Total Protein: 6.8 g/dL (ref 6.5–8.1)

## 2021-08-07 LAB — CBC WITH DIFFERENTIAL (CANCER CENTER ONLY)
Abs Immature Granulocytes: 0.01 10*3/uL (ref 0.00–0.07)
Basophils Absolute: 0.1 10*3/uL (ref 0.0–0.1)
Basophils Relative: 2 %
Eosinophils Absolute: 0.1 10*3/uL (ref 0.0–0.5)
Eosinophils Relative: 3 %
HCT: 35.5 % — ABNORMAL LOW (ref 36.0–46.0)
Hemoglobin: 11.6 g/dL — ABNORMAL LOW (ref 12.0–15.0)
Immature Granulocytes: 0 %
Lymphocytes Relative: 41 %
Lymphs Abs: 1.7 10*3/uL (ref 0.7–4.0)
MCH: 30.8 pg (ref 26.0–34.0)
MCHC: 32.7 g/dL (ref 30.0–36.0)
MCV: 94.2 fL (ref 80.0–100.0)
Monocytes Absolute: 0.6 10*3/uL (ref 0.1–1.0)
Monocytes Relative: 14 %
Neutro Abs: 1.6 10*3/uL — ABNORMAL LOW (ref 1.7–7.7)
Neutrophils Relative %: 40 %
Platelet Count: 406 10*3/uL — ABNORMAL HIGH (ref 150–400)
RBC: 3.77 MIL/uL — ABNORMAL LOW (ref 3.87–5.11)
RDW: 14.6 % (ref 11.5–15.5)
WBC Count: 4 10*3/uL (ref 4.0–10.5)
nRBC: 0 % (ref 0.0–0.2)

## 2021-08-07 MED ORDER — SODIUM CHLORIDE 0.9 % IV SOLN
150.0000 mg | Freq: Once | INTRAVENOUS | Status: AC
  Administered 2021-08-07: 150 mg via INTRAVENOUS
  Filled 2021-08-07: qty 150

## 2021-08-07 MED ORDER — HEPARIN SOD (PORK) LOCK FLUSH 100 UNIT/ML IV SOLN
500.0000 [IU] | Freq: Once | INTRAVENOUS | Status: AC | PRN
  Administered 2021-08-07: 500 [IU]

## 2021-08-07 MED ORDER — ACETAMINOPHEN 325 MG PO TABS
650.0000 mg | ORAL_TABLET | Freq: Four times a day (QID) | ORAL | Status: DC | PRN
  Administered 2021-08-07: 650 mg via ORAL
  Filled 2021-08-07: qty 2

## 2021-08-07 MED ORDER — SODIUM CHLORIDE 0.9 % IV SOLN
10.0000 mg | Freq: Once | INTRAVENOUS | Status: AC
  Administered 2021-08-07: 10 mg via INTRAVENOUS
  Filled 2021-08-07: qty 10

## 2021-08-07 MED ORDER — SODIUM CHLORIDE 0.9 % IV SOLN
800.0000 mg/m2 | Freq: Once | INTRAVENOUS | Status: AC
  Administered 2021-08-07: 1482 mg via INTRAVENOUS
  Filled 2021-08-07: qty 38.98

## 2021-08-07 MED ORDER — PALONOSETRON HCL INJECTION 0.25 MG/5ML
0.2500 mg | Freq: Once | INTRAVENOUS | Status: AC
  Administered 2021-08-07: 0.25 mg via INTRAVENOUS
  Filled 2021-08-07: qty 5

## 2021-08-07 MED ORDER — SODIUM CHLORIDE 0.9% FLUSH
10.0000 mL | Freq: Once | INTRAVENOUS | Status: AC
  Administered 2021-08-07: 10 mL

## 2021-08-07 MED ORDER — SODIUM CHLORIDE 0.9% FLUSH
10.0000 mL | INTRAVENOUS | Status: DC | PRN
  Administered 2021-08-07: 10 mL

## 2021-08-07 MED ORDER — SODIUM CHLORIDE 0.9 % IV SOLN
394.0000 mg | Freq: Once | INTRAVENOUS | Status: AC
  Administered 2021-08-07: 390 mg via INTRAVENOUS
  Filled 2021-08-07: qty 39

## 2021-08-07 MED ORDER — SODIUM CHLORIDE 0.9 % IV SOLN: Freq: Once | INTRAVENOUS | Status: AC

## 2021-08-07 NOTE — Progress Notes (Signed)
Hematology and Oncology Follow Up Visit  Cynthia [name] [id number] Jun 04, 1945 76 y.o. 08/07/2021 11:45 AM Cynthia [name], MDWebb, Cynthia Cookey, MD   Principle Diagnosis: 76 year old woman with bladder cancer diagnosed May 2023.  She was found to have multifocal disease.   Prior Therapy:  She is status post left nephro ureterectomy in 2021.  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  Current therapy: Carboplatin and gemcitabine started on July 17, 2021.  She is here for day 1 cycle 2 of therapy.  Interim History: Cynthia [name] returns today for a follow-up visit.  Since the last visit, she completed the first cycle of carboplatin and gemcitabine.  Day 8 gemcitabine was withheld due to the neutropenia.  She denies any nausea, vomiting or abdominal discomfort.  She denies any hospitalizations or illnesses.     Medications: I have reviewed the patient's current medications.  Current Outpatient Medications  Medication Sig Dispense Refill   acetaminophen (TYLENOL) 500 MG tablet Take 1,000 mg by mouth at bedtime.     ALPRAZolam (XANAX) 1 MG tablet Take 2 mg by mouth at bedtime.     amiodarone (PACERONE) 100 MG tablet Take 1 tablet (100 mg total) by mouth daily. 90 tablet 3   anastrozole (ARIMIDEX) 1 MG tablet TAKE 1 TABLET BY MOUTH EVERY DAY 90 tablet 4   apixaban (ELIQUIS) 5 MG TABS tablet TAKE 1 TABLET BY MOUTH TWICE A DAY 60 tablet 6   Cholecalciferol (VITAMIN D) 50 MCG (2000 UT) CAPS Take 2,000 Units by mouth daily.     Choline Fenofibrate (FENOFIBRIC ACID) 135 MG CPDR Take 135 mg by mouth daily.      ferrous sulfate 325 (65 FE) MG tablet Take 325 mg by mouth at bedtime.     HYDROcodone-acetaminophen (NORCO/VICODIN) 5-325 MG tablet Take 1 tablet by mouth every 6 (six) hours as needed for moderate pain or severe pain (post-operatively). 10 tablet 0   levothyroxine (SYNTHROID, LEVOTHROID) 112 MCG tablet Take 112 mcg by mouth at bedtime.      lidocaine (LIDODERM) 5 %  Place 1 patch onto the skin daily as needed. Remove & Discard patch within 12 hours or as directed by MD (Patient not taking: Reported on 05/28/2021) 15 patch 0   lidocaine-prilocaine (EMLA) cream Apply 1 Application topically as needed. 30 g 0   mirtazapine (REMERON) 15 MG tablet Take 15 mg by mouth at bedtime.     pantoprazole (PROTONIX) 40 MG tablet Take 40 mg by mouth 2 (two) times daily.     prochlorperazine (COMPAZINE) 10 MG tablet Take 1 tablet (10 mg total) by mouth every 6 (six) hours as needed for nausea or vomiting. 30 tablet 0   psyllium (REGULOID) 0.52 g capsule Take 3.64 g by mouth daily. 7 caps daily     senna-docusate (SENOKOT-S) 8.6-50 MG tablet Take 1 tablet by mouth 2 (two) times daily. While taking strong pain meds to prevent constipation 10 tablet 0   No current facility-administered medications for this visit.   Facility-Administered Medications Ordered in Other Visits  Medication Dose Route Frequency Provider Last Rate Last Admin   sodium chloride flush (NS) 0.9 % injection 10 mL  10 mL Intracatheter Once Cynthia Portela, MD         Allergies: No Known Allergies    Physical Exam: Blood pressure (!) 166/72, pulse 65, temperature (!) 97.2 F (36.2 C), temperature source Temporal, resp. rate 15, weight 167 lb 1.6 oz (75.8 kg), SpO2 98 %.  ECOG: 1 General appearance: alert and cooperative appeared without distress. Head: Normocephalic, without obvious abnormality Oropharynx: No oral thrush or ulcers. Eyes: No scleral icterus.  Pupils are equal and round reactive to light. Lymph nodes: Cervical, supraclavicular, and axillary nodes normal. Heart:regular rate and rhythm, S1, S2 normal, no murmur, click, rub or gallop Lung:chest clear, no wheezing, rales, normal symmetric air entry Abdomin: soft, non-tender, without masses or organomegaly. Neurological: No motor, sensory deficits.  Intact deep tendon reflexes. Skin: No rashes or lesions.  No ecchymosis or  petechiae. Musculoskeletal: No joint deformity or effusion.     Lab Results: Lab Results  Component Value Date   WBC 2.3 (L) 07/24/2021   HGB 11.3 (L) 07/24/2021   HCT 34.4 (L) 07/24/2021   MCV 93.5 07/24/2021   PLT 155 07/24/2021     Chemistry      Component Value Date/Time   NA 143 07/24/2021 1348   NA 128 (L) 05/27/2019 1151   NA 141 03/19/2016 1216   K 3.9 07/24/2021 1348   K 3.5 03/19/2016 1216   CL 109 07/24/2021 1348   CO2 28 07/24/2021 1348   CO2 29 03/19/2016 1216   BUN 25 (H) 07/24/2021 1348   BUN 15 05/27/2019 1151   BUN 19.2 03/19/2016 1216   CREATININE 1.11 (H) 07/24/2021 1348   CREATININE 1.4 (H) 03/19/2016 1216      Component Value Date/Time   CALCIUM 9.5 07/24/2021 1348   CALCIUM 9.9 03/19/2016 1216   ALKPHOS 41 07/24/2021 1348   ALKPHOS 45 03/19/2016 1216   AST 25 07/24/2021 1348   AST 15 03/19/2016 1216   ALT 17 07/24/2021 1348   ALT 10 03/19/2016 1216   BILITOT 0.4 07/24/2021 1348   BILITOT 0.42 03/19/2016 1216        IMPRESSION: Prior left nephroureterectomy and TURBT. No evidence of metastatic disease.   Colonic diverticulosis, without radiographic evidence of diverticulitis.   Multiple tiny calcified uterine fibroids.   Stable [name] right paraumbilical ventral hernia, which contains only fat.   Aortic Atherosclerosis (ICD10-I70.0).    Impression and Plan:  76 year old with:  1.  Multifocal bladder tumor diagnosed in May 2023.  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    She is currently receiving neoadjuvant chemotherapy with carboplatin and gemcitabine without any complications.  Risks and benefits of proceeding with cycle 2 were discussed at this time.  Laboratory data from today reviewed and showed adequate neutrophil counts.  She is agreeable to proceed.  PET scan obtained on July 29, 2021 showed no evidence of metastatic disease.     2.  IV access: Port-A-Cath inserted without any issues.    3.  Antiemetics: No nausea or vomiting reported at this time.  Compazine is available.   4.  Renal function surveillance: Creatinine clearance continues to fluctuate between 46 to 50 cc/min.  We will continue to monitor on platinum therapy.   5.  Goals of care: Her disease is incurable and aggressive measures are warranted.   6.  Breast cancer: Continues to be on Arimidex currently.  7.  Neutropenia: Related to chemotherapy.  Will adjust gemcitabine dosing and potentially eliminate day 8 future if needed.   8.  Follow-up: In 1 week to complete cycle 2 and in 3 weeks for the beginning of cycle 3.   30  minutes were spent on this encounter.  The time was dedicated to reviewing laboratory data, disease status update and treatment choices for the future.  Zola Button, MD 8/2/202311:45 AM

## 2021-08-07 NOTE — Patient Instructions (Signed)
[facility] MEDICAL ONCOLOGY  Discharge Instructions: Thank you for choosing [facility] to provide your oncology and hematology care.   If you have a lab appointment with the Cancer Center, please go directly to the Cancer Center and check in at the registration area.   Wear comfortable clothing and clothing appropriate for easy access to any Portacath or PICC line.   We strive to give you quality time with your provider. You may need to reschedule your appointment if you arrive late (15 or more minutes).  Arriving late affects you and other patients whose appointments are after yours.  Also, if you miss three or more appointments without notifying the office, you may be dismissed from the clinic at the provider's discretion.      For prescription refill requests, have your pharmacy contact our office and allow 72 hours for refills to be completed.    Today you received the following chemotherapy and/or immunotherapy agents: Gemcitabine, Carboplatin.       To help prevent nausea and vomiting after your treatment, we encourage you to take your nausea medication as directed.  BELOW ARE SYMPTOMS THAT SHOULD BE REPORTED IMMEDIATELY: *FEVER GREATER THAN 100.4 F (38 C) OR HIGHER *CHILLS OR SWEATING *NAUSEA AND VOMITING THAT IS NOT CONTROLLED WITH YOUR NAUSEA MEDICATION *UNUSUAL SHORTNESS OF BREATH *UNUSUAL BRUISING OR BLEEDING *URINARY PROBLEMS (pain or burning when urinating, or frequent urination) *BOWEL PROBLEMS (unusual diarrhea, constipation, pain near the anus) TENDERNESS IN MOUTH AND THROAT WITH OR WITHOUT PRESENCE OF ULCERS (sore throat, sores in mouth, or a toothache) UNUSUAL RASH, SWELLING OR PAIN  UNUSUAL VAGINAL DISCHARGE OR ITCHING   Items with * indicate a potential emergency and should be followed up as soon as possible or go to the Emergency Department if any problems should occur.  Please show the CHEMOTHERAPY ALERT CARD or IMMUNOTHERAPY ALERT  CARD at check-in to the Emergency Department and triage nurse.  Should you have questions after your visit or need to cancel or reschedule your appointment, please contact [facility] MEDICAL ONCOLOGY  Dept: [phone]  and follow the prompts.  Office hours are 8:00 a.m. to 4:30 p.m. Monday - Friday. Please note that voicemails left after 4:00 p.m. may not be returned until the following business day.  We are closed weekends and major holidays. You have access to a nurse at all times for urgent questions. Please call the main number to the clinic Dept: [phone] and follow the prompts.   For any non-urgent questions, you may also contact your provider using MyChart. We now offer e-Visits for anyone 18 and older to request care online for non-urgent symptoms. For details visit [email or web address].   Also download the MyChart app! Go to the app store, search "MyChart", open the app, select [facility], and log in with your MyChart username and password.  Masks are optional in the cancer centers. If you would like for your care team to wear a mask while they are taking care of you, please let them know. You may have one support person who is at least 78 years old accompany you for your appointments. 

## 2021-08-08 NOTE — Progress Notes (Signed)
[employer] Summary Report / Loop Recorder 

## 2021-08-09 ENCOUNTER — Telehealth: Payer: Self-pay | Admitting: Physician Assistant

## 2021-08-09 LAB — CUP PACEART REMOTE DEVICE CHECK
Date Time Interrogation Session: 20230803232242
Implantable Pulse Generator Implant Date: 20210521

## 2021-08-09 NOTE — Telephone Encounter (Signed)
Scheduled per 08/02 los, called and spoke with patient's daughter. Patient will be notified of upcoming appointments.

## 2021-08-10 ENCOUNTER — Other Ambulatory Visit: Payer: Self-pay

## 2021-08-12 ENCOUNTER — Ambulatory Visit (INDEPENDENT_AMBULATORY_CARE_PROVIDER_SITE_OTHER): Payer: Medicare PPO

## 2021-08-12 DIAGNOSIS — R55 Syncope and collapse: Secondary | ICD-10-CM

## 2021-08-12 DIAGNOSIS — G5603 Carpal tunnel syndrome, bilateral upper limbs: Secondary | ICD-10-CM | POA: Diagnosis not present

## 2021-08-14 ENCOUNTER — Inpatient Hospital Stay: Payer: Medicare PPO

## 2021-08-14 ENCOUNTER — Other Ambulatory Visit: Payer: Self-pay

## 2021-08-14 VITALS — BP 143/70 | HR 61 | Temp 97.6°F | Resp 18 | Wt 166.0 lb

## 2021-08-14 DIAGNOSIS — Z79811 Long term (current) use of aromatase inhibitors: Secondary | ICD-10-CM | POA: Diagnosis not present

## 2021-08-14 DIAGNOSIS — C679 Malignant neoplasm of bladder, unspecified: Secondary | ICD-10-CM

## 2021-08-14 DIAGNOSIS — C50211 Malignant neoplasm of upper-inner quadrant of right female breast: Secondary | ICD-10-CM | POA: Diagnosis not present

## 2021-08-14 DIAGNOSIS — T451X5A Adverse effect of antineoplastic and immunosuppressive drugs, initial encounter: Secondary | ICD-10-CM | POA: Diagnosis not present

## 2021-08-14 DIAGNOSIS — K5730 Diverticulosis of large intestine without perforation or abscess without bleeding: Secondary | ICD-10-CM | POA: Diagnosis not present

## 2021-08-14 DIAGNOSIS — D701 Agranulocytosis secondary to cancer chemotherapy: Secondary | ICD-10-CM | POA: Diagnosis not present

## 2021-08-14 DIAGNOSIS — N390 Urinary tract infection, site not specified: Secondary | ICD-10-CM | POA: Diagnosis not present

## 2021-08-14 DIAGNOSIS — Z5111 Encounter for antineoplastic chemotherapy: Secondary | ICD-10-CM | POA: Diagnosis not present

## 2021-08-14 DIAGNOSIS — Z95828 Presence of other vascular implants and grafts: Secondary | ICD-10-CM

## 2021-08-14 DIAGNOSIS — Z170 Estrogen receptor positive status [ER+]: Secondary | ICD-10-CM | POA: Diagnosis not present

## 2021-08-14 DIAGNOSIS — C689 Malignant neoplasm of urinary organ, unspecified: Secondary | ICD-10-CM

## 2021-08-14 LAB — CBC WITH DIFFERENTIAL (CANCER CENTER ONLY)
Abs Immature Granulocytes: 0.01 10*3/uL (ref 0.00–0.07)
Basophils Absolute: 0 10*3/uL (ref 0.0–0.1)
Basophils Relative: 1 %
Eosinophils Absolute: 0 10*3/uL (ref 0.0–0.5)
Eosinophils Relative: 1 %
HCT: 32.6 % — ABNORMAL LOW (ref 36.0–46.0)
Hemoglobin: 10.9 g/dL — ABNORMAL LOW (ref 12.0–15.0)
Immature Granulocytes: 0 %
Lymphocytes Relative: 47 %
Lymphs Abs: 1.4 10*3/uL (ref 0.7–4.0)
MCH: 31.5 pg (ref 26.0–34.0)
MCHC: 33.4 g/dL (ref 30.0–36.0)
MCV: 94.2 fL (ref 80.0–100.0)
Monocytes Absolute: 0.2 10*3/uL (ref 0.1–1.0)
Monocytes Relative: 7 %
Neutro Abs: 1.3 10*3/uL — ABNORMAL LOW (ref 1.7–7.7)
Neutrophils Relative %: 44 %
Platelet Count: 212 10*3/uL (ref 150–400)
RBC: 3.46 MIL/uL — ABNORMAL LOW (ref 3.87–5.11)
RDW: 14.5 % (ref 11.5–15.5)
WBC Count: 2.9 10*3/uL — ABNORMAL LOW (ref 4.0–10.5)
nRBC: 0 % (ref 0.0–0.2)

## 2021-08-14 LAB — CMP (CANCER CENTER ONLY)
ALT: 18 U/L (ref 0–44)
AST: 28 U/L (ref 15–41)
Albumin: 3.8 g/dL (ref 3.5–5.0)
Alkaline Phosphatase: 44 U/L (ref 38–126)
Anion gap: 4 — ABNORMAL LOW (ref 5–15)
BUN: 23 mg/dL (ref 8–23)
CO2: 31 mmol/L (ref 22–32)
Calcium: 9.1 mg/dL (ref 8.9–10.3)
Chloride: 106 mmol/L (ref 98–111)
Creatinine: 0.94 mg/dL (ref 0.44–1.00)
GFR, Estimated: >60 mL/min (ref >60)
Glucose, Bld: 115 mg/dL — ABNORMAL HIGH (ref 70–99)
Potassium: 4.3 mmol/L (ref 3.5–5.1)
Sodium: 141 mmol/L (ref 135–145)
Total Bilirubin: 0.4 mg/dL (ref 0.3–1.2)
Total Protein: 6.8 g/dL (ref 6.5–8.1)

## 2021-08-14 MED ORDER — SODIUM CHLORIDE 0.9% FLUSH
10.0000 mL | INTRAVENOUS | Status: DC | PRN
  Administered 2021-08-14: 10 mL

## 2021-08-14 MED ORDER — PROCHLORPERAZINE MALEATE 10 MG PO TABS
10.0000 mg | ORAL_TABLET | Freq: Once | ORAL | Status: AC
  Administered 2021-08-14: 10 mg via ORAL

## 2021-08-14 MED ORDER — ACETAMINOPHEN 500 MG PO TABS
1000.0000 mg | ORAL_TABLET | Freq: Four times a day (QID) | ORAL | Status: DC | PRN
  Administered 2021-08-14: 1000 mg via ORAL

## 2021-08-14 MED ORDER — HEPARIN SOD (PORK) LOCK FLUSH 100 UNIT/ML IV SOLN
500.0000 [IU] | Freq: Once | INTRAVENOUS | Status: AC | PRN
  Administered 2021-08-14: 500 [IU]

## 2021-08-14 MED ORDER — SODIUM CHLORIDE 0.9% FLUSH
10.0000 mL | Freq: Once | INTRAVENOUS | Status: AC
  Administered 2021-08-14: 10 mL

## 2021-08-14 MED ORDER — PROCHLORPERAZINE MALEATE 10 MG PO TABS
ORAL_TABLET | ORAL | Status: DC
Start: 2021-08-14 — End: 2021-08-14
  Filled 2021-08-14: qty 1

## 2021-08-14 MED ORDER — ACETAMINOPHEN 500 MG PO TABS
ORAL_TABLET | ORAL | Status: DC
Start: 2021-08-14 — End: 2021-08-14
  Filled 2021-08-14: qty 2

## 2021-08-14 MED ORDER — SODIUM CHLORIDE 0.9 % IV SOLN: Freq: Once | INTRAVENOUS | Status: AC

## 2021-08-14 MED ORDER — SODIUM CHLORIDE 0.9 % IV SOLN
800.0000 mg/m2 | Freq: Once | INTRAVENOUS | Status: AC
  Administered 2021-08-14: 1482 mg via INTRAVENOUS
  Filled 2021-08-14: qty 38.98

## 2021-08-14 NOTE — Progress Notes (Signed)
Per Dr. Alen Blew, okay to proceed with tx with ANC of 1.3 today.

## 2021-08-14 NOTE — Patient Instructions (Signed)
[facility] MEDICAL ONCOLOGY   Discharge Instructions: Thank you for choosing [facility] to provide your oncology and hematology care.   If you have a lab appointment with the Cancer Center, please go directly to the Cancer Center and check in at the registration area.   Wear comfortable clothing and clothing appropriate for easy access to any Portacath or PICC line.   We strive to give you quality time with your provider. You may need to reschedule your appointment if you arrive late (15 or more minutes).  Arriving late affects you and other patients whose appointments are after yours.  Also, if you miss three or more appointments without notifying the office, you may be dismissed from the clinic at the provider's discretion.      For prescription refill requests, have your pharmacy contact our office and allow 72 hours for refills to be completed.    Today you received the following chemotherapy and/or immunotherapy agents: gemcitabine      To help prevent nausea and vomiting after your treatment, we encourage you to take your nausea medication as directed.  BELOW ARE SYMPTOMS THAT SHOULD BE REPORTED IMMEDIATELY: *FEVER GREATER THAN 100.4 F (38 C) OR HIGHER *CHILLS OR SWEATING *NAUSEA AND VOMITING THAT IS NOT CONTROLLED WITH YOUR NAUSEA MEDICATION *UNUSUAL SHORTNESS OF BREATH *UNUSUAL BRUISING OR BLEEDING *URINARY PROBLEMS (pain or burning when urinating, or frequent urination) *BOWEL PROBLEMS (unusual diarrhea, constipation, pain near the anus) TENDERNESS IN MOUTH AND THROAT WITH OR WITHOUT PRESENCE OF ULCERS (sore throat, sores in mouth, or a toothache) UNUSUAL RASH, SWELLING OR PAIN  UNUSUAL VAGINAL DISCHARGE OR ITCHING   Items with * indicate a potential emergency and should be followed up as soon as possible or go to the Emergency Department if any problems should occur.  Please show the CHEMOTHERAPY ALERT CARD or IMMUNOTHERAPY ALERT CARD at check-in  to the Emergency Department and triage nurse.  Should you have questions after your visit or need to cancel or reschedule your appointment, please contact [facility]ICAL [facility]COL[facility]  Dept: [phone]  and follow the prompts.  Office hours are 8:00 a.m. to 4:30 p.m. Monday - Friday. Please note that voicemails left after 4:00 p.m. may not be returned until the following business day.  We are closed weekends and major holidays. You have access to a nurse at all times for urgent questions. Please call the main number to the clinic Dept: [phone] and follow the prompts.   For any non-urgent questions, you may also contact your provider using MyChart. We now offer e-Visits for anyone 18 and older to request care online for non-urgent symptoms. For details visit [email or web address].   Also download the MyChart app! Go to the app store, search "MyChart", open the app, select [facility], and log in with your MyChart username and password.  Masks are optional in the cancer centers. If you would like for your care team to wear a mask while they are taking care of you, please let them know. You may have one support person who is at least 78 years old accompany you for your appointments. 

## 2021-08-18 ENCOUNTER — Other Ambulatory Visit: Payer: Self-pay | Admitting: Oncology

## 2021-08-18 DIAGNOSIS — C679 Malignant neoplasm of bladder, unspecified: Secondary | ICD-10-CM

## 2021-08-19 ENCOUNTER — Emergency Department (HOSPITAL_BASED_OUTPATIENT_CLINIC_OR_DEPARTMENT_OTHER): Payer: Medicare PPO | Admitting: Radiology

## 2021-08-19 ENCOUNTER — Encounter (HOSPITAL_BASED_OUTPATIENT_CLINIC_OR_DEPARTMENT_OTHER): Payer: Self-pay

## 2021-08-19 ENCOUNTER — Emergency Department (HOSPITAL_BASED_OUTPATIENT_CLINIC_OR_DEPARTMENT_OTHER)
Admission: EM | Admit: 2021-08-19 | Discharge: 2021-08-19 | Disposition: A | Discharge status: Home / Self Care | Payer: Medicare PPO | Attending: Emergency Medicine | Admitting: Emergency Medicine

## 2021-08-19 ENCOUNTER — Other Ambulatory Visit: Payer: Self-pay

## 2021-08-19 DIAGNOSIS — R509 Fever, unspecified: Secondary | ICD-10-CM

## 2021-08-19 DIAGNOSIS — N3001 Acute cystitis with hematuria: Secondary | ICD-10-CM | POA: Insufficient documentation

## 2021-08-19 DIAGNOSIS — R350 Frequency of micturition: Secondary | ICD-10-CM | POA: Diagnosis not present

## 2021-08-19 DIAGNOSIS — Z85528 Personal history of other malignant neoplasm of kidney: Secondary | ICD-10-CM | POA: Insufficient documentation

## 2021-08-19 DIAGNOSIS — Z79899 Other long term (current) drug therapy: Secondary | ICD-10-CM | POA: Diagnosis not present

## 2021-08-19 DIAGNOSIS — R300 Dysuria: Secondary | ICD-10-CM | POA: Diagnosis present

## 2021-08-19 DIAGNOSIS — Z853 Personal history of malignant neoplasm of breast: Secondary | ICD-10-CM | POA: Insufficient documentation

## 2021-08-19 HISTORY — DX: Malignant neoplasm of bladder, unspecified: C67.9

## 2021-08-19 LAB — COMPREHENSIVE METABOLIC PANEL
ALT: 29 U/L (ref 0–44)
AST: 33 U/L (ref 15–41)
Albumin: 3.6 g/dL (ref 3.5–5.0)
Alkaline Phosphatase: 44 U/L (ref 38–126)
Anion gap: 10 (ref 5–15)
BUN: 21 mg/dL (ref 8–23)
CO2: 25 mmol/L (ref 22–32)
Calcium: 8.6 mg/dL — ABNORMAL LOW (ref 8.9–10.3)
Chloride: 101 mmol/L (ref 98–111)
Creatinine, Ser: 1.11 mg/dL — ABNORMAL HIGH (ref 0.44–1.00)
GFR, Estimated: 51 mL/min — ABNORMAL LOW (ref >60)
Glucose, Bld: 118 mg/dL — ABNORMAL HIGH (ref 70–99)
Potassium: 3.6 mmol/L (ref 3.5–5.1)
Sodium: 136 mmol/L (ref 135–145)
Total Bilirubin: 0.6 mg/dL (ref 0.3–1.2)
Total Protein: 6.2 g/dL — ABNORMAL LOW (ref 6.5–8.1)

## 2021-08-19 LAB — URINALYSIS, ROUTINE W REFLEX MICROSCOPIC
Bilirubin Urine: NEGATIVE
Glucose, UA: NEGATIVE mg/dL
Ketones, ur: NEGATIVE mg/dL
Nitrite: POSITIVE — AB
Protein, ur: 30 mg/dL — AB
Specific Gravity, Urine: 1.022 (ref 1.005–1.030)
WBC, UA: >50 WBC/hpf — ABNORMAL HIGH (ref 0–5)
pH: 5.5 (ref 5.0–8.0)

## 2021-08-19 LAB — CBC WITH DIFFERENTIAL/PLATELET
Abs Immature Granulocytes: 0.04 10*3/uL (ref 0.00–0.07)
Basophils Absolute: 0 10*3/uL (ref 0.0–0.1)
Basophils Relative: 0 %
Eosinophils Absolute: 0 10*3/uL (ref 0.0–0.5)
Eosinophils Relative: 0 %
HCT: 29.4 % — ABNORMAL LOW (ref 36.0–46.0)
Hemoglobin: 9.8 g/dL — ABNORMAL LOW (ref 12.0–15.0)
Immature Granulocytes: 1 %
Lymphocytes Relative: 23 %
Lymphs Abs: 0.9 10*3/uL (ref 0.7–4.0)
MCH: 31.5 pg (ref 26.0–34.0)
MCHC: 33.3 g/dL (ref 30.0–36.0)
MCV: 94.5 fL (ref 80.0–100.0)
Monocytes Absolute: 0.1 10*3/uL (ref 0.1–1.0)
Monocytes Relative: 2 %
Neutro Abs: 3 10*3/uL (ref 1.7–7.7)
Neutrophils Relative %: 74 %
Platelets: 66 10*3/uL — ABNORMAL LOW (ref 150–400)
RBC: 3.11 MIL/uL — ABNORMAL LOW (ref 3.87–5.11)
RDW: 14.4 % (ref 11.5–15.5)
WBC: 4 10*3/uL (ref 4.0–10.5)
nRBC: 0 % (ref 0.0–0.2)

## 2021-08-19 LAB — LACTIC ACID, PLASMA: Lactic Acid, Venous: 0.6 mmol/L (ref 0.5–1.9)

## 2021-08-19 MED ORDER — CEFDINIR 300 MG PO CAPS: 300.0000 mg | ORAL_CAPSULE | Freq: Two times a day (BID) | ORAL | 0 refills | Status: DC

## 2021-08-19 MED ORDER — SODIUM CHLORIDE 0.9 % IV SOLN
1.0000 g | Freq: Once | INTRAVENOUS | Status: AC
  Administered 2021-08-19: 1 g via INTRAVENOUS
  Filled 2021-08-19: qty 10

## 2021-08-19 MED ORDER — SODIUM CHLORIDE 0.9 % IV BOLUS
500.0000 mL | Freq: Once | INTRAVENOUS | Status: AC
  Administered 2021-08-19: 500 mL via INTRAVENOUS

## 2021-08-19 NOTE — Discharge Instructions (Addendum)
Work-up today suggested a urinary tract infection that does not show any other significant pathology causing your symptoms.  Please begin taking the antibiotics as prescribed, and follow-up with your oncologist in the morning to ensure that your symptoms are not worsening.  Please return to the emergency department if your fever worsens despite Tylenol, ibuprofen, you begin to feel significantly worse.

## 2021-08-19 NOTE — ED Triage Notes (Addendum)
Pt has bladder cancer dx this year, currently on chemo, last treatment was 8/9 Pt c/o dysuria, fever and chills Did give urine sample to urologist today Pt does have a port

## 2021-08-19 NOTE — ED Provider Notes (Signed)
[facility] Provider Note   CSN: [id number] Arrival date & time: 08/19/21  1936     History  Chief Complaint  Patient presents with   Fever   Dysuria    Cynthia [name] is a 76 y.o. female with past medical history significant for multiple previous neoplasms including breast cancer, kidney cancer with kidney removal, patient now with only 1 kidney, she is currently being treated for bladder cancer, received new round of chemo on 8/9.  She is reporting some dysuria, fever, chills, decreased urine output.  She reports that she saw her urologist today and given new urine sample.  She has began to develop a fever, with Tmax 102.1.  She took 1000 mg of Tylenol around 2 hours prior to arrival, fever was still 101.2 on arrival.  She denies any pain, nausea.  She did have a procedure done at the end of May with transurethral resection of cancer.  No other recent procedures noted.  Reports that she is feeling a little bit more fatigued than normal after chemotherapy and endorses some shortness of breath on exertion but denies any chest pain, hemoptysis.  She denies any recent rash.  She denies any vaginal discharge or vaginal bleeding.   Fever Associated symptoms: dysuria   Dysuria Associated symptoms: fever        Home Medications Prior to Admission medications   Medication Sig Start Date End Date Taking? Authorizing Provider  cefdinir (OMNICEF) 300 MG capsule Take 1 capsule (300 mg total) by mouth 2 (two) times daily. 08/19/21  Yes Bryleigh Ottaway H, PA-C  acetaminophen (TYLENOL) 500 MG tablet Take 1,000 mg by mouth at bedtime.    [provider]  ALPRAZolam Duanne Moron) 1 MG tablet Take 2 mg by mouth at bedtime. 12/09/18   [provider]  amiodarone (PACERONE) 100 MG tablet Take 1 tablet (100 mg total) by mouth daily. 06/26/21   Deboraha Sprang, MD  anastrozole (ARIMIDEX) 1 MG tablet TAKE 1 TABLET BY MOUTH EVERY DAY 10/08/20   Magrinat, [name], MD   apixaban (ELIQUIS) 5 MG TABS tablet TAKE 1 TABLET BY MOUTH TWICE A DAY 01/18/21   Deboraha Sprang, MD  Cholecalciferol (VITAMIN D) 50 MCG (2000 UT) CAPS Take 2,000 Units by mouth daily.    [provider]  Choline Fenofibrate (FENOFIBRIC ACID) 135 MG CPDR Take 135 mg by mouth daily.  01/16/16   [provider]  ferrous sulfate 325 (65 FE) MG tablet Take 325 mg by mouth at bedtime.    [provider]  HYDROcodone-acetaminophen (NORCO/VICODIN) 5-325 MG tablet Take 1 tablet by mouth every 6 (six) hours as needed for moderate pain or severe pain (post-operatively). 06/05/21 06/05/22  Alexis Frock, MD  levothyroxine (SYNTHROID, LEVOTHROID) 112 MCG tablet Take 112 mcg by mouth at bedtime.  03/13/16   [provider]  lidocaine (LIDODERM) 5 % Place 1 patch onto the skin daily as needed. Remove & Discard patch within 12 hours or as directed by MD 03/23/21   Wynona Dove A, DO  lidocaine-prilocaine (EMLA) cream Apply 1 Application topically as needed. 07/05/21   Wyatt Portela, MD  mirtazapine (REMERON) 15 MG tablet Take 15 mg by mouth at bedtime. 02/14/21   [provider]  pantoprazole (PROTONIX) 40 MG tablet Take 40 mg by mouth 2 (two) times daily. 05/20/21   [provider]  prochlorperazine (COMPAZINE) 10 MG tablet Take 1 tablet (10 mg total) by mouth every 6 (six) hours as needed for  nausea or vomiting. 07/05/21   Wyatt Portela, MD  psyllium (REGULOID) 0.52 g capsule Take 3.64 g by mouth daily. 7 caps daily    [provider]  senna-docusate (SENOKOT-S) 8.6-50 MG tablet Take 1 tablet by mouth 2 (two) times daily. While taking strong pain meds to prevent constipation 06/05/21   Alexis Frock, MD      Allergies    Patient has no known allergies.    Review of Systems   Review of Systems  Constitutional:  Positive for fever.  Genitourinary:  Positive for dysuria.  All other systems reviewed and are negative.   Physical Exam Updated Vital  Signs BP 130/76   Pulse 69   Temp 99.7 F (37.6 C) (Oral)   Resp 18   Ht '5\' 5"'$  (1.651 m)   Wt 73.5 kg   SpO2 98%   BMI 26.96 kg/m  Physical Exam Vitals and nursing note reviewed.  Constitutional:      General: She is not in acute distress.    Appearance: Normal appearance. She is ill-appearing.     Comments: Somewhat ill-appearing but not in acute distress, nontoxic, nonseptic appearing.  HENT:     Head: Normocephalic and atraumatic.  Eyes:     General:        Right eye: No discharge.        Left eye: No discharge.  Cardiovascular:     Rate and Rhythm: Normal rate and regular rhythm.     Heart sounds: No murmur heard.    No friction rub. No gallop.  Pulmonary:     Effort: Pulmonary effort is normal.     Breath sounds: Normal breath sounds.     Comments: Clear breath sounds bilaterally Abdominal:     General: Bowel sounds are normal.     Palpations: Abdomen is soft.     Comments: No significant tenderness palpation of the abdomen  Skin:    General: Skin is warm and dry.     Capillary Refill: Capillary refill takes less than 2 seconds.     Comments: Very warm to the touch, no rashes noted  Neurological:     Mental Status: She is alert and oriented to person, place, and time.  Psychiatric:        Mood and Affect: Mood normal.        Behavior: Behavior normal.     ED Results / Procedures / Treatments   Labs (all labs ordered are listed, but only abnormal results are displayed) Labs Reviewed  CBC WITH DIFFERENTIAL/PLATELET - Abnormal; Notable for the following components:      Result Value   RBC 3.11 (*)    Hemoglobin 9.8 (*)    HCT 29.4 (*)    Platelets 66 (*)    All other components within normal limits  COMPREHENSIVE METABOLIC PANEL - Abnormal; Notable for the following components:   Glucose, Bld 118 (*)    Creatinine, Ser 1.11 (*)    Calcium 8.6 (*)    Total Protein 6.2 (*)    GFR, Estimated 51 (*)    All other components within normal limits   URINALYSIS, ROUTINE W REFLEX MICROSCOPIC - Abnormal; Notable for the following components:   APPearance CLOUDY (*)    Hgb urine dipstick MODERATE (*)    Protein, ur 30 (*)    Nitrite POSITIVE (*)    Leukocytes,Ua LARGE (*)    WBC, UA >50 (*)    Bacteria, UA FEW (*)    All other  components within normal limits  CULTURE, BLOOD (ROUTINE X 2)  CULTURE, BLOOD (ROUTINE X 2)  URINE CULTURE  LACTIC ACID, PLASMA    EKG None  Radiology DG Chest 2 View  Result Date: 08/19/2021 CLINICAL DATA:  Fever workup in cancer patient EXAM: CHEST - 2 VIEW COMPARISON:  Radiographs 04/18/2021 FINDINGS: No focal consolidation, pleural effusion, or pneumothorax. Stable mild cardiomegaly. No acute osseous abnormality. Aortic calcification. Right chest wall Port-A-Cath tip in the low SVC. Loop recorder. Surgical clips right breast and axilla. IMPRESSION: No active cardiopulmonary disease.  Stable mild cardiomegaly. Electronically Signed   By: Placido Sou M.D.   On: 08/19/2021 21:12    Procedures Procedures    Medications Ordered in ED Medications  sodium chloride 0.9 % bolus 500 mL ( Intravenous Stopped 08/19/21 2203)  cefTRIAXone (ROCEPHIN) 1 g in sodium chloride 0.9 % 100 mL IVPB (0 g Intravenous Stopped 08/19/21 2208)    ED Course/ Medical Decision Making/ A&P                           Medical Decision Making Amount and/or Complexity of Data Reviewed Labs: ordered. Radiology: ordered.   This patient is a 76 y.o. female who presents to the ED for concern of fever in context of bladder cancer, recent chemotherapy 5 days ago, with some dysuria, chills, this involves an extensive number of treatment options, and is a complaint that carries with it a high risk of complications and morbidity. The emergent differential diagnosis prior to evaluation includes, but is not limited to, UTI, pyelonephritis, sepsis, nephrolithiasis, febrile neutropenia, also considered other intra-abdominal infection,  pneumonia, rash, chemotherapy response, other infection and cancer patient..   This is not an exhaustive differential.   Past Medical History / Co-morbidities / Social History: multiple previous neoplasms including breast cancer, kidney cancer with kidney removal, patient now with only 1 kidney, she is currently being treated for bladder cancer  Additional history: Chart reviewed. Pertinent results include: Reviewed recent oncology notes, cardiology visits, I cannot see any lab work or urine that was performed at her urologist earlier today  Physical Exam: Physical exam performed. The pertinent findings include: Patient is febrile on arrival with temperature of 101.2, otherwise well-appearing, pleasant, without any active abdominal pain, nausea, vomiting at this time  Lab Tests: I ordered, and personally interpreted labs.  The pertinent results include: CBC shows worsening anemia, hemoglobin 9.8, as well as fairly significant thrombocytopenia with 66 platelets after her recent course of chemotherapy.  She is not having any active bleeding, bruising, petechiae at this time to suggest acute hemorrhage.  Do think that she should follow-up with her oncologist soon as she is able to discuss these lab changes and make sure that this is an appropriate response to her chemotherapy.  Her CMP is overall reassuring, she has slight decrease of her total protein at 6.2, creatinine 1.11 is fairly stable for patient.  Her urinalysis does suggest an acute UTI with moderate hemoglobin, large leukocytes, greater than 50 white blood cells, bacteria, white blood cell clumps and mucus.  Additionally it is nitrite positive.   Imaging Studies: I ordered imaging studies including plain film chest x-ray. I independently visualized and interpreted imaging which showed no acute intrathoracic abnormality, stable mild cardiomegaly. I agree with the radiologist interpretation.   Cardiac Monitoring:  The patient was maintained  on a cardiac monitor.  My attending physician Dr. Tyrone Nine viewed and interpreted the cardiac monitored which showed  an underlying rhythm of: Normal sinus rhythm at this time. I agree with this interpretation.   Medications: I ordered medication including Rocephin, small fluid bolus for active UTI symptoms, did not administer any antipyretic because patient took 1000 mg of p.o. Tylenol 2 hours prior to arrival.  Fever improved, patient will need close recheck to ensure that her dysuria, UTI symptoms are improving.  She has no evidence of acute urinary tract infection today.   Disposition: After consideration of the diagnostic results and the patients response to treatment, I feel that patient stable for discharge at this time, I do recommend that she calls her oncologist in the morning to discuss her CBC changes as well as current treatment for UTI to ensure resolution and no progression to worsening infection especially in context of compromised immune system of cancer patient.   emergency department workup does not suggest an emergent condition requiring admission or immediate intervention beyond what has been performed at this time. The plan is: Discharge with [facility], and close oncology follow-up, extensive return precautions. The patient is safe for discharge and has been instructed to return immediately for worsening symptoms, change in symptoms or any other concerns.  I discussed this case with my attending physician Dr. Tyrone Nine who cosigned this note including patient's presenting symptoms, physical exam, and planned diagnostics and interventions. Attending physician stated agreement with plan or made changes to plan which were implemented.    Final Clinical Impression(s) / ED Diagnoses Final diagnoses:  Acute cystitis with hematuria  Fever, unspecified fever cause    Rx / DC Orders ED Discharge Orders          Ordered    cefdinir (OMNICEF) 300 MG capsule  2 times daily        08/19/21 2311               Floraine Buechler, Joesph Fillers, PA-C 08/19/21 2352    Deno Etienne, DO 08/20/21 1459

## 2021-08-21 LAB — URINE CULTURE: Culture: >=100000 — AB

## 2021-08-22 NOTE — Progress Notes (Signed)
[place] OFFICE PROGRESS NOTE  [name], MD Cynthia [name] [address] [place] [address]  DIAGNOSIS: 76 year old woman with bladder cancer diagnosed May 2023.  She was found to have multifocal disease.  PRIOR THERAPY: She is status post left nephro ureterectomy in 2021.  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  CURRENT THERAPY: Carboplatin and gemcitabine started on July 17, 2021.  She is here for day 1 cycle 3 of therapy.  Dose of Gemzar reduced starting from cycle #2 due to neutropenia with cycle #1  INTERVAL HISTORY: Cynthia [name] 76 y.o. female returns to the clinic today for a follow-up visit accompanied by her daughter.  The patient is currently undergoing chemotherapy on days 1 and 8 IV every 3 weeks for bladder cancer.  Day 8 was held from cycle 1 due to neutropenia.  Therefore, Dr. Alen Blew adjusted/slightly reduced the dose of gemcitabine starting from cycle #2 to 800 mg/m2.  The patient underwent cycle #2 on 08/14/2021.  She tolerated treatment fairly well except she presented to the emergency room on 8/14/3 with chief complaint of fever and dysuria.  She saw her urologist the day of her ER admission and had a urine sample performed.  Later on in the day, she developed a fever.  UA did suggest UTI and she was treated with Rocephin.  She was also discharged on Omnicef.   The patient is feeling at her baseline at this time and denies any urinary complaints. She denies any other signs of infection including sore throat, cough, shortness of breath, skin infections, or diarrhea.  Denies any nausea or vomiting.  She denies any fevers, chills, night sweats, or weight loss. She reports she gained weight. She denies abdominal pain. 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 She had an episode of tingling in her left leg after sitting for a prolonged period of time at [place]. She previously was reporting tingling in her  right fingers at prior appointments which has "gotten better".  She is scheduled to see her urology in September. She is here today for evaluation and repeat blood work before starting cycle #3.    MEDICAL HISTORY: Past Medical History:  Diagnosis Date   AICD (automatic cardioverter/defibrillator) present    Medtronic   Anticoagulant long-term use    eliquis--- managed by cardiology   Anxiety    Arthritis    Bladder cancer ([place])    Bradycardia    Cancer of kidney ([place])    left   DDD (degenerative disc disease), lumbar    Depression    Dyspnea    ON EXERTION   First degree heart block    Genetic testing 06/25/2016   Ms. Ricketson underwent genetic counseling and testing for hereditary cancer syndromes on 06/17/2016. Her results were negative for mutations in all 46 genes analyzed by [facility]'s 46-gene Common Hereditary Cancers Panel. Genes analyzed include: APC, ATM, AXIN2, BARD1, BMPR1A, BRCA1, BRCA2, BRIP1, CDH1, CDKN2A, CHEK2, CTNNA1, DICER1, EPCAM, GREM1, HOXB13, KIT, MEN1, MLH1, MSH2, MSH3, MSH6, MUTYH, NBN,   GERD (gastroesophageal reflux disease)    occasional,  takes pepcid   Hematuria    History of radiation therapy 05/22/2016 to 06/20/2016   right breast cancer   Hypertension    followd by pcp---  (02-22-2019 per pt never had stress test)   Hypothyroidism    followed by pcp   IBS (irritable bowel syndrome)    Incomplete right bundle branch block  Insomnia    Malignant neoplasm of upper-inner quadrant of right breast in female, estrogen receptor positive [facility]) oncologist--- dr [name]   dx 03/ 2018,  Stage IA, IDC, ER/PR +;  04-01-2016 s/p  right breast lumpectomy w/ node dissection's;  completed radiation 06-20-2016   MVP (mitral valve prolapse)    per echo 12-11-2018 in epic, mild    NSVT (nonsustained ventricular tachycardia) ([place]) followed by cardiology   12-10-2018  hospital admission ,  refer to discharge note 12-14-2018 for treatement   PAF (paroxysmal atrial  fibrillation) [facility]) primary cardiologist--- dr Margaretann Loveless   newly dx 12-10-2018  admission in epic, Afib w/ RVR and NSVT;   in epic TTE 12-11-2018 showed ef 50-55%, mild MVP,  mild AV sclerosis without stenosis;   Cardiac MRI 12-13-2018 in epic   Renal mass, left    pelvis    Restless leg syndrome    occasional   Type 2 diabetes mellitus ([place])    followed by pcp---  (02-22-2019 does not check blood sugar's)    ALLERGIES:  has No Known Allergies.  MEDICATIONS:  Current Outpatient Medications  Medication Sig Dispense Refill   acetaminophen (TYLENOL) 500 MG tablet Take 1,000 mg by mouth at bedtime.     ALPRAZolam (XANAX) 1 MG tablet Take 2 mg by mouth at bedtime.     amiodarone (PACERONE) 100 MG tablet Take 1 tablet (100 mg total) by mouth daily. 90 tablet 3   anastrozole (ARIMIDEX) 1 MG tablet TAKE 1 TABLET BY MOUTH EVERY DAY 90 tablet 4   apixaban (ELIQUIS) 5 MG TABS tablet TAKE 1 TABLET BY MOUTH TWICE A DAY 60 tablet 6   Cholecalciferol (VITAMIN D3) 50 MCG (2000 UT) capsule 1 capsule     Choline Fenofibrate (FENOFIBRIC ACID) 135 MG CPDR Take 135 mg by mouth daily.      ferrous sulfate 325 (65 FE) MG tablet Take 325 mg by mouth at bedtime.     levothyroxine (SYNTHROID, LEVOTHROID) 112 MCG tablet Take 112 mcg by mouth at bedtime.      lidocaine (LIDODERM) 5 % Place 1 patch onto the skin daily as needed. Remove & Discard patch within 12 hours or as directed by MD 15 patch 0   lidocaine-prilocaine (EMLA) cream Apply 1 Application topically as needed. 30 g 0   mirtazapine (REMERON) 15 MG tablet Take 15 mg by mouth at bedtime.     pantoprazole (PROTONIX) 40 MG tablet Take 40 mg by mouth 2 (two) times daily.     prochlorperazine (COMPAZINE) 10 MG tablet Take 1 tablet (10 mg total) by mouth every 6 (six) hours as needed for nausea or vomiting. 30 tablet 0   psyllium (REGULOID) 0.52 g capsule Take 3.64 g by mouth daily. 7 caps daily     senna-docusate (SENOKOT-S) 8.6-50 MG tablet Take 1 tablet by  mouth 2 (two) times daily. While taking strong pain meds to prevent constipation 10 tablet 0   HYDROcodone-acetaminophen (NORCO/VICODIN) 5-325 MG tablet Take 1 tablet by mouth every 6 (six) hours as needed for moderate pain or severe pain (post-operatively). (Patient not taking: Reported on 08/29/2021) 10 tablet 0   No current facility-administered medications for this visit.    SURGICAL HISTORY:  Past Surgical History:  Procedure Laterality Date   BREAST LUMPECTOMY WITH RADIOACTIVE SEED AND SENTINEL LYMPH NODE BIOPSY Right 04/01/2016   Procedure: RADIOACTIVE SEED GUIDED RIGHT BREAST LUMPECTOMY WITH RIGHT AXILLARY SENTINEL LYMPH NODE BIOPSY.;  Surgeon: Fanny Skates, MD;  Location: [facility];  Service:  General;  Laterality: Right;   CARDIAC CATHETERIZATION  12/11/2018   CATARACT EXTRACTION W/ INTRAOCULAR LENS  IMPLANT, BILATERAL  1980s   COLONOSCOPY     CYSTOSCOPY W/ RETROGRADES Right 06/05/2021   Procedure: CYSTOSCOPY WITH RETROGRADE PYELOGRAM;  Surgeon: Alexis Frock, MD;  Location: [facility];  Service: Urology;  Laterality: Right;   CYSTOSCOPY/RETROGRADE/URETEROSCOPY N/A 03/02/2019   Procedure: CYSTOSCOPY/LEFT RETROGRADE/URETEROSCOPY/  BIOPSY/  STENT;  Surgeon: Ceasar Mons, MD;  Location[facility];  Service: Urology;  Laterality: N/A;   INCISIONAL HERNIA REPAIR N/A 03/29/2020   Procedure: REPAIR OF INCISIONAL HERNIA WITH MESH;  Surgeon: Erroll Luna, MD;  Location: [facility];  Service: General;  Laterality: N/A;   INSERTION OF ICD     medtronic   IR IMAGING GUIDED PORT INSERTION  07/18/2021   LAPAROSCOPIC CHOLECYSTECTOMY  1990s   ROBOT ASSITED LAPAROSCOPIC NEPHROURETERECTOMY Left 04/27/2019   Procedure: XI ROBOT ASSITED LAPAROSCOPIC NEPHROURETERECTOMY;  Surgeon: Alexis Frock, MD;  Location: [facility];  Service: Urology;  Laterality: Left;  3.5 HRS   TONSILLECTOMY  age 56   TRANSURETHRAL RESECTION OF BLADDER TUMOR N/A 06/05/2021   Procedure: TRANSURETHRAL RESECTION OF  BLADDER TUMOR (TURBT);  Surgeon: Alexis Frock, MD;  Location: [facility];  Service: Urology;  Laterality: N/A;    REVIEW OF SYSTEMS:   Review of Systems  Constitutional: Negative for appetite change, chills, fatigue, fever and unexpected weight change.  HENT: Negative for mouth sores, nosebleeds, sore throat and trouble swallowing.   Eyes: Negative for eye problems and icterus.  Respiratory: Negative for cough, hemoptysis, shortness of breath and wheezing.   Cardiovascular: Negative for chest pain and leg swelling.  Gastrointestinal: Negative for abdominal pain, constipation, diarrhea, nausea and vomiting.  Genitourinary: Negative for bladder incontinence, difficulty urinating, dysuria, frequency and hematuria.   Musculoskeletal: Negative for back pain, gait problem, neck pain and neck stiffness.  Skin: Negative for itching and rash.  Neurological: Negative for dizziness, extremity weakness, gait problem, headaches, light-headedness and seizures.  Hematological: Negative for adenopathy. Does not bleed easily.  Reports bruising on upper extremity due to blood thinner. Psychiatric/Behavioral: Negative for confusion, depression and sleep disturbance. The patient is not nervous/anxious.     PHYSICAL EXAMINATION:  There were no vitals taken for this visit.  ECOG PERFORMANCE STATUS: 1  Physical Exam  Constitutional: Oriented to person, place, and time and well-developed, well-nourished, and in no distress.  HENT:  Head: Normocephalic and atraumatic.  Mouth/Throat: Oropharynx is clear and moist. No oropharyngeal exudate.  Eyes: Conjunctivae are normal. Right eye exhibits no discharge. Left eye exhibits no discharge. No scleral icterus.  Neck: Normal range of motion. Neck supple.  Cardiovascular: Normal rate, regular rhythm, murmur noted and intact distal pulses.   Pulmonary/Chest: Effort normal and breath sounds normal. No respiratory distress. No wheezes. No rales.  Abdominal: Soft. Bowel  sounds are normal. Exhibits no distension and no mass. There is no tenderness.  Musculoskeletal: Normal range of motion. Exhibits no edema.  Lymphadenopathy:    No cervical adenopathy.  Neurological: Alert and oriented to person, place, and time. Exhibits normal muscle tone. Gait normal. Coordination normal.  Skin: Skin is warm and dry. No rash noted. Not diaphoretic. No erythema. No pallor.  Positive for some bruising on upper extremity.  The patient is on a blood thinner. Psychiatric: Mood, memory and judgment normal.  Vitals reviewed.  LABORATORY DATA: Lab Results  Component Value Date   WBC 3.0 (L) 08/29/2021   HGB 9.5 (L) 08/29/2021   HCT  28.7 (L) 08/29/2021   MCV 97.3 08/29/2021   PLT 444 (H) 08/29/2021      Chemistry      Component Value Date/Time   NA 141 08/29/2021 1125   NA 128 (L) 05/27/2019 1151   NA 141 03/19/2016 1216   K 4.1 08/29/2021 1125   K 3.5 03/19/2016 1216   CL 107 08/29/2021 1125   CO2 30 08/29/2021 1125   CO2 29 03/19/2016 1216   BUN 19 08/29/2021 1125   BUN 15 05/27/2019 1151   BUN 19.2 03/19/2016 1216   CREATININE 0.95 08/29/2021 1125   CREATININE 1.4 (H) 03/19/2016 1216      Component Value Date/Time   CALCIUM 9.2 08/29/2021 1125   CALCIUM 9.9 03/19/2016 1216   ALKPHOS 46 08/29/2021 1125   ALKPHOS 45 03/19/2016 1216   AST 21 08/29/2021 1125   AST 15 03/19/2016 1216   ALT 13 08/29/2021 1125   ALT 10 03/19/2016 1216   BILITOT 0.3 08/29/2021 1125   BILITOT 0.42 03/19/2016 1216       RADIOGRAPHIC STUDIES:  DG Chest 2 View  Result Date: 08/19/2021 CLINICAL DATA:  Fever workup in cancer patient EXAM: CHEST - 2 VIEW COMPARISON:  Radiographs 04/18/2021 FINDINGS: No focal consolidation, pleural effusion, or pneumothorax. Stable mild cardiomegaly. No acute osseous abnormality. Aortic calcification. Right chest wall Port-A-Cath tip in the low SVC. Loop recorder. Surgical clips right breast and axilla. IMPRESSION: No active cardiopulmonary  disease.  Stable mild cardiomegaly. Electronically Signed   By: Placido Sou M.D.   On: 08/19/2021 21:12   CUP PACEART REMOTE DEVICE CHECK  Result Date: 08/09/2021 ILR summary report received. Battery status OK. Normal device function. No new symptom, tachy, brady, or pause episodes. Monthly summary reports and ROV/PRN AF episodes/compass up to 6hrs in duration, burden 7.3%, Eliquis, Amiodarone.  No EGM's for review. LA    ASSESSMENT/PLAN:  76 year old with:  1.  Multifocal bladder tumor diagnosed in May 2023.  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   She is currently receiving neoadjuvant chemotherapy with carboplatin and gemcitabine without any complications except she does have neutropenia frequently. Dr. Alen Blew reduecd her dose of gemzar starting from cycle #2. Risks and benefits of proceeding with cycle 3 were discussed at this time.  Laboratory data from today reviewed and showed total WBC of 3.0 and ANC of 1.2. Reviewed with Dr. Alen Blew. Recommend that she proceed with cycle #3 day 1 as scheduled. Of course, we will recheck her labs next week on 8/30 before undergoing cycle 3 day 8. Day 8 C 3 may be discontinued next week depending on her labs if she shows more significant neutropenia. The patient is aware of this possibility.     2.  IV access: Port-A-Cath inserted without any issues.   3.  Antiemetics: No nausea or vomiting reported at this time.  Compazine is available.   4.  Renal function surveillance: Creatinine clearance continues to fluctuate between 46 to 50 cc/min.  We will continue to monitor on platinum therapy.   5.  Goals of care: Her disease is incurable and aggressive measures are warranted.   6.  Breast cancer: Continues to be on Arimidex currently.   7.  Neutropenia: Related to chemotherapy.  Dr. Alen Blew reduce the dose of gemcitabine starting from cycle #2.  He may potentially remove day 8 altogether in the future. Neutropenic precautions  reviewed with the patient today. She knows to call with any signs or symptoms infection.  8.  Urinary tract infection: Patient was seen in the emergency room on 08/19/2021 for urinary tract infection.  She was treated with Omnicef and Rocephin.  Her symptoms have resolved at this time.  Should this occur in the future, recommend that the patient call our clinic or reach out to her urologist.   9.  Follow-up: In 1 week to complete cycle 3 and in 3 weeks for the beginning of cycle 4      No orders of the defined types were placed in this encounter.     The total time spent in the appointment was 20-29 minutes.   Kengo Sturges L Reshonda Koerber, PA-C 08/29/21

## 2021-08-25 LAB — CULTURE, BLOOD (ROUTINE X 2)
Culture: NO GROWTH
Culture: NO GROWTH
Special Requests: ADEQUATE
Special Requests: ADEQUATE

## 2021-08-28 ENCOUNTER — Inpatient Hospital Stay: Payer: Medicare PPO

## 2021-08-28 ENCOUNTER — Inpatient Hospital Stay: Payer: Medicare PPO | Admitting: Oncology

## 2021-08-28 ENCOUNTER — Ambulatory Visit: Payer: Medicare PPO | Admitting: Physician Assistant

## 2021-08-28 MED FILL — Dexamethasone Sodium Phosphate Inj 100 MG/10ML: INTRAMUSCULAR | Qty: 1 | Status: AC

## 2021-08-28 MED FILL — Fosaprepitant Dimeglumine For IV Infusion 150 MG (Base Eq): INTRAVENOUS | Qty: 5 | Status: AC

## 2021-08-29 ENCOUNTER — Other Ambulatory Visit: Payer: Self-pay

## 2021-08-29 ENCOUNTER — Inpatient Hospital Stay: Payer: Medicare PPO

## 2021-08-29 ENCOUNTER — Inpatient Hospital Stay: Payer: Medicare PPO | Admitting: Physician Assistant

## 2021-08-29 ENCOUNTER — Encounter: Payer: Self-pay | Admitting: Physician Assistant

## 2021-08-29 VITALS — BP 142/71 | HR 59 | Temp 97.8°F | Resp 17

## 2021-08-29 DIAGNOSIS — I700 Atherosclerosis of aorta: Secondary | ICD-10-CM | POA: Diagnosis present

## 2021-08-29 DIAGNOSIS — D72819 Decreased white blood cell count, unspecified: Secondary | ICD-10-CM | POA: Diagnosis present

## 2021-08-29 DIAGNOSIS — Z8041 Family history of malignant neoplasm of ovary: Secondary | ICD-10-CM

## 2021-08-29 DIAGNOSIS — F5104 Psychophysiologic insomnia: Secondary | ICD-10-CM | POA: Diagnosis present

## 2021-08-29 DIAGNOSIS — Z923 Personal history of irradiation: Secondary | ICD-10-CM

## 2021-08-29 DIAGNOSIS — Z20822 Contact with and (suspected) exposure to covid-19: Secondary | ICD-10-CM | POA: Diagnosis present

## 2021-08-29 DIAGNOSIS — C679 Malignant neoplasm of bladder, unspecified: Secondary | ICD-10-CM

## 2021-08-29 DIAGNOSIS — Z906 Acquired absence of other parts of urinary tract: Secondary | ICD-10-CM

## 2021-08-29 DIAGNOSIS — C689 Malignant neoplasm of urinary organ, unspecified: Secondary | ICD-10-CM

## 2021-08-29 DIAGNOSIS — D75839 Thrombocytosis, unspecified: Secondary | ICD-10-CM | POA: Diagnosis present

## 2021-08-29 DIAGNOSIS — Z79899 Other long term (current) drug therapy: Secondary | ICD-10-CM

## 2021-08-29 DIAGNOSIS — Z853 Personal history of malignant neoplasm of breast: Secondary | ICD-10-CM

## 2021-08-29 DIAGNOSIS — Z95828 Presence of other vascular implants and grafts: Secondary | ICD-10-CM

## 2021-08-29 DIAGNOSIS — I517 Cardiomegaly: Secondary | ICD-10-CM | POA: Diagnosis not present

## 2021-08-29 DIAGNOSIS — J189 Pneumonia, unspecified organism: Secondary | ICD-10-CM | POA: Diagnosis not present

## 2021-08-29 DIAGNOSIS — R0602 Shortness of breath: Secondary | ICD-10-CM | POA: Diagnosis present

## 2021-08-29 DIAGNOSIS — Z7901 Long term (current) use of anticoagulants: Secondary | ICD-10-CM

## 2021-08-29 DIAGNOSIS — I472 Ventricular tachycardia, unspecified: Secondary | ICD-10-CM | POA: Diagnosis present

## 2021-08-29 DIAGNOSIS — I480 Paroxysmal atrial fibrillation: Secondary | ICD-10-CM | POA: Diagnosis present

## 2021-08-29 DIAGNOSIS — G4700 Insomnia, unspecified: Secondary | ICD-10-CM | POA: Diagnosis not present

## 2021-08-29 DIAGNOSIS — Z1231 Encounter for screening mammogram for malignant neoplasm of breast: Secondary | ICD-10-CM | POA: Diagnosis not present

## 2021-08-29 DIAGNOSIS — I10 Essential (primary) hypertension: Secondary | ICD-10-CM | POA: Diagnosis present

## 2021-08-29 DIAGNOSIS — Z8049 Family history of malignant neoplasm of other genital organs: Secondary | ICD-10-CM

## 2021-08-29 DIAGNOSIS — Z6830 Body mass index (BMI) 30.0-30.9, adult: Secondary | ICD-10-CM

## 2021-08-29 DIAGNOSIS — Z79890 Hormone replacement therapy (postmenopausal): Secondary | ICD-10-CM

## 2021-08-29 DIAGNOSIS — R509 Fever, unspecified: Secondary | ICD-10-CM | POA: Diagnosis not present

## 2021-08-29 DIAGNOSIS — F32A Depression, unspecified: Secondary | ICD-10-CM | POA: Diagnosis present

## 2021-08-29 DIAGNOSIS — E785 Hyperlipidemia, unspecified: Secondary | ICD-10-CM | POA: Diagnosis present

## 2021-08-29 DIAGNOSIS — J9601 Acute respiratory failure with hypoxia: Secondary | ICD-10-CM | POA: Diagnosis present

## 2021-08-29 DIAGNOSIS — D630 Anemia in neoplastic disease: Secondary | ICD-10-CM | POA: Diagnosis present

## 2021-08-29 DIAGNOSIS — R531 Weakness: Secondary | ICD-10-CM | POA: Diagnosis not present

## 2021-08-29 DIAGNOSIS — Z806 Family history of leukemia: Secondary | ICD-10-CM

## 2021-08-29 DIAGNOSIS — Z85528 Personal history of other malignant neoplasm of kidney: Secondary | ICD-10-CM

## 2021-08-29 DIAGNOSIS — E669 Obesity, unspecified: Secondary | ICD-10-CM | POA: Diagnosis present

## 2021-08-29 DIAGNOSIS — I248 Other forms of acute ischemic heart disease: Secondary | ICD-10-CM | POA: Diagnosis present

## 2021-08-29 DIAGNOSIS — J168 Pneumonia due to other specified infectious organisms: Secondary | ICD-10-CM | POA: Diagnosis not present

## 2021-08-29 DIAGNOSIS — C50211 Malignant neoplasm of upper-inner quadrant of right female breast: Secondary | ICD-10-CM | POA: Diagnosis present

## 2021-08-29 DIAGNOSIS — D7589 Other specified diseases of blood and blood-forming organs: Secondary | ICD-10-CM | POA: Diagnosis present

## 2021-08-29 DIAGNOSIS — Z905 Acquired absence of kidney: Secondary | ICD-10-CM

## 2021-08-29 DIAGNOSIS — K219 Gastro-esophageal reflux disease without esophagitis: Secondary | ICD-10-CM | POA: Diagnosis present

## 2021-08-29 DIAGNOSIS — R778 Other specified abnormalities of plasma proteins: Secondary | ICD-10-CM | POA: Diagnosis not present

## 2021-08-29 DIAGNOSIS — Z8589 Personal history of malignant neoplasm of other organs and systems: Secondary | ICD-10-CM

## 2021-08-29 DIAGNOSIS — Z803 Family history of malignant neoplasm of breast: Secondary | ICD-10-CM

## 2021-08-29 DIAGNOSIS — E039 Hypothyroidism, unspecified: Secondary | ICD-10-CM | POA: Diagnosis present

## 2021-08-29 DIAGNOSIS — Z87440 Personal history of urinary (tract) infections: Secondary | ICD-10-CM

## 2021-08-29 DIAGNOSIS — D6481 Anemia due to antineoplastic chemotherapy: Secondary | ICD-10-CM | POA: Diagnosis present

## 2021-08-29 DIAGNOSIS — T451X5A Adverse effect of antineoplastic and immunosuppressive drugs, initial encounter: Secondary | ICD-10-CM | POA: Diagnosis present

## 2021-08-29 DIAGNOSIS — D709 Neutropenia, unspecified: Secondary | ICD-10-CM | POA: Diagnosis not present

## 2021-08-29 DIAGNOSIS — Z79811 Long term (current) use of aromatase inhibitors: Secondary | ICD-10-CM

## 2021-08-29 DIAGNOSIS — E119 Type 2 diabetes mellitus without complications: Secondary | ICD-10-CM | POA: Diagnosis present

## 2021-08-29 DIAGNOSIS — I440 Atrioventricular block, first degree: Secondary | ICD-10-CM | POA: Diagnosis present

## 2021-08-29 DIAGNOSIS — Z95810 Presence of automatic (implantable) cardiac defibrillator: Secondary | ICD-10-CM

## 2021-08-29 DIAGNOSIS — Z170 Estrogen receptor positive status [ER+]: Secondary | ICD-10-CM

## 2021-08-29 DIAGNOSIS — R0902 Hypoxemia: Secondary | ICD-10-CM | POA: Diagnosis not present

## 2021-08-29 DIAGNOSIS — A419 Sepsis, unspecified organism: Secondary | ICD-10-CM | POA: Diagnosis not present

## 2021-08-29 DIAGNOSIS — R918 Other nonspecific abnormal finding of lung field: Secondary | ICD-10-CM | POA: Diagnosis not present

## 2021-08-29 DIAGNOSIS — D638 Anemia in other chronic diseases classified elsewhere: Secondary | ICD-10-CM | POA: Diagnosis not present

## 2021-08-29 DIAGNOSIS — F419 Anxiety disorder, unspecified: Secondary | ICD-10-CM | POA: Diagnosis present

## 2021-08-29 DIAGNOSIS — I4891 Unspecified atrial fibrillation: Secondary | ICD-10-CM | POA: Diagnosis not present

## 2021-08-29 LAB — CBC WITH DIFFERENTIAL (CANCER CENTER ONLY)
Abs Immature Granulocytes: 0.01 10*3/uL (ref 0.00–0.07)
Basophils Absolute: 0.1 10*3/uL (ref 0.0–0.1)
Basophils Relative: 2 %
Eosinophils Absolute: 0.1 10*3/uL (ref 0.0–0.5)
Eosinophils Relative: 4 %
HCT: 28.7 % — ABNORMAL LOW (ref 36.0–46.0)
Hemoglobin: 9.5 g/dL — ABNORMAL LOW (ref 12.0–15.0)
Immature Granulocytes: 0 %
Lymphocytes Relative: 36 %
Lymphs Abs: 1.1 10*3/uL (ref 0.7–4.0)
MCH: 32.2 pg (ref 26.0–34.0)
MCHC: 33.1 g/dL (ref 30.0–36.0)
MCV: 97.3 fL (ref 80.0–100.0)
Monocytes Absolute: 0.6 10*3/uL (ref 0.1–1.0)
Monocytes Relative: 19 %
Neutro Abs: 1.2 10*3/uL — ABNORMAL LOW (ref 1.7–7.7)
Neutrophils Relative %: 39 %
Platelet Count: 444 10*3/uL — ABNORMAL HIGH (ref 150–400)
RBC: 2.95 MIL/uL — ABNORMAL LOW (ref 3.87–5.11)
RDW: 16.6 % — ABNORMAL HIGH (ref 11.5–15.5)
WBC Count: 3 10*3/uL — ABNORMAL LOW (ref 4.0–10.5)
nRBC: 0 % (ref 0.0–0.2)

## 2021-08-29 LAB — CMP (CANCER CENTER ONLY)
ALT: 13 U/L (ref 0–44)
AST: 21 U/L (ref 15–41)
Albumin: 3.8 g/dL (ref 3.5–5.0)
Alkaline Phosphatase: 46 U/L (ref 38–126)
Anion gap: 4 — ABNORMAL LOW (ref 5–15)
BUN: 19 mg/dL (ref 8–23)
CO2: 30 mmol/L (ref 22–32)
Calcium: 9.2 mg/dL (ref 8.9–10.3)
Chloride: 107 mmol/L (ref 98–111)
Creatinine: 0.95 mg/dL (ref 0.44–1.00)
GFR, Estimated: >60 mL/min (ref >60)
Glucose, Bld: 110 mg/dL — ABNORMAL HIGH (ref 70–99)
Potassium: 4.1 mmol/L (ref 3.5–5.1)
Sodium: 141 mmol/L (ref 135–145)
Total Bilirubin: 0.3 mg/dL (ref 0.3–1.2)
Total Protein: 6.4 g/dL — ABNORMAL LOW (ref 6.5–8.1)

## 2021-08-29 MED ORDER — PALONOSETRON HCL INJECTION 0.25 MG/5ML
0.2500 mg | Freq: Once | INTRAVENOUS | Status: AC
  Administered 2021-08-29: 0.25 mg via INTRAVENOUS
  Filled 2021-08-29: qty 5

## 2021-08-29 MED ORDER — SODIUM CHLORIDE 0.9 % IV SOLN
150.0000 mg | Freq: Once | INTRAVENOUS | Status: AC
  Administered 2021-08-29: 150 mg via INTRAVENOUS
  Filled 2021-08-29: qty 150

## 2021-08-29 MED ORDER — HEPARIN SOD (PORK) LOCK FLUSH 100 UNIT/ML IV SOLN
500.0000 [IU] | Freq: Once | INTRAVENOUS | Status: AC | PRN
  Administered 2021-08-29: 500 [IU]

## 2021-08-29 MED ORDER — SODIUM CHLORIDE 0.9 % IV SOLN
800.0000 mg/m2 | Freq: Once | INTRAVENOUS | Status: AC
  Administered 2021-08-29: 1482 mg via INTRAVENOUS
  Filled 2021-08-29: qty 38.98

## 2021-08-29 MED ORDER — SODIUM CHLORIDE 0.9% FLUSH
10.0000 mL | Freq: Once | INTRAVENOUS | Status: AC
  Administered 2021-08-29: 10 mL

## 2021-08-29 MED ORDER — SODIUM CHLORIDE 0.9% FLUSH
10.0000 mL | INTRAVENOUS | Status: DC | PRN
  Administered 2021-08-29: 10 mL

## 2021-08-29 MED ORDER — SODIUM CHLORIDE 0.9 % IV SOLN
400.5000 mg | Freq: Once | INTRAVENOUS | Status: AC
  Administered 2021-08-29: 400 mg via INTRAVENOUS
  Filled 2021-08-29: qty 40

## 2021-08-29 MED ORDER — SODIUM CHLORIDE 0.9 % IV SOLN
10.0000 mg | Freq: Once | INTRAVENOUS | Status: AC
  Administered 2021-08-29: 10 mg via INTRAVENOUS
  Filled 2021-08-29: qty 10

## 2021-08-29 MED ORDER — SODIUM CHLORIDE 0.9 % IV SOLN: Freq: Once | INTRAVENOUS | Status: AC

## 2021-08-29 NOTE — Progress Notes (Signed)
Ok to treat with ANC 1.2 K/uL per [place], PA

## 2021-08-30 DIAGNOSIS — Z1231 Encounter for screening mammogram for malignant neoplasm of breast: Secondary | ICD-10-CM | POA: Diagnosis not present

## 2021-08-31 ENCOUNTER — Inpatient Hospital Stay (HOSPITAL_COMMUNITY)
Admission: EM | Admit: 2021-08-31 | Discharge: 2021-09-03 | DRG: 193 | Disposition: A | Discharge status: Home / Self Care | Payer: Medicare PPO | Attending: Internal Medicine | Admitting: Internal Medicine

## 2021-08-31 ENCOUNTER — Emergency Department (HOSPITAL_COMMUNITY): Payer: Medicare PPO

## 2021-08-31 ENCOUNTER — Encounter (HOSPITAL_COMMUNITY): Payer: Self-pay

## 2021-08-31 ENCOUNTER — Other Ambulatory Visit: Payer: Self-pay

## 2021-08-31 DIAGNOSIS — J189 Pneumonia, unspecified organism: Secondary | ICD-10-CM | POA: Diagnosis not present

## 2021-08-31 DIAGNOSIS — I480 Paroxysmal atrial fibrillation: Secondary | ICD-10-CM | POA: Diagnosis present

## 2021-08-31 DIAGNOSIS — E119 Type 2 diabetes mellitus without complications: Secondary | ICD-10-CM

## 2021-08-31 DIAGNOSIS — Z853 Personal history of malignant neoplasm of breast: Secondary | ICD-10-CM | POA: Diagnosis not present

## 2021-08-31 DIAGNOSIS — T451X5A Adverse effect of antineoplastic and immunosuppressive drugs, initial encounter: Secondary | ICD-10-CM | POA: Diagnosis present

## 2021-08-31 DIAGNOSIS — D75839 Thrombocytosis, unspecified: Secondary | ICD-10-CM

## 2021-08-31 DIAGNOSIS — F32A Depression, unspecified: Secondary | ICD-10-CM | POA: Diagnosis present

## 2021-08-31 DIAGNOSIS — E669 Obesity, unspecified: Secondary | ICD-10-CM | POA: Diagnosis present

## 2021-08-31 DIAGNOSIS — K219 Gastro-esophageal reflux disease without esophagitis: Secondary | ICD-10-CM | POA: Diagnosis present

## 2021-08-31 DIAGNOSIS — D6481 Anemia due to antineoplastic chemotherapy: Secondary | ICD-10-CM | POA: Diagnosis present

## 2021-08-31 DIAGNOSIS — Z95810 Presence of automatic (implantable) cardiac defibrillator: Secondary | ICD-10-CM | POA: Diagnosis not present

## 2021-08-31 DIAGNOSIS — J121 Respiratory syncytial virus pneumonia: Secondary | ICD-10-CM | POA: Diagnosis present

## 2021-08-31 DIAGNOSIS — J9601 Acute respiratory failure with hypoxia: Secondary | ICD-10-CM | POA: Diagnosis present

## 2021-08-31 DIAGNOSIS — R778 Other specified abnormalities of plasma proteins: Secondary | ICD-10-CM | POA: Diagnosis not present

## 2021-08-31 DIAGNOSIS — Z20822 Contact with and (suspected) exposure to covid-19: Secondary | ICD-10-CM | POA: Diagnosis present

## 2021-08-31 DIAGNOSIS — I700 Atherosclerosis of aorta: Secondary | ICD-10-CM | POA: Diagnosis present

## 2021-08-31 DIAGNOSIS — E039 Hypothyroidism, unspecified: Secondary | ICD-10-CM | POA: Diagnosis present

## 2021-08-31 DIAGNOSIS — D630 Anemia in neoplastic disease: Secondary | ICD-10-CM | POA: Diagnosis present

## 2021-08-31 DIAGNOSIS — E785 Hyperlipidemia, unspecified: Secondary | ICD-10-CM | POA: Diagnosis present

## 2021-08-31 DIAGNOSIS — I4891 Unspecified atrial fibrillation: Secondary | ICD-10-CM | POA: Diagnosis not present

## 2021-08-31 DIAGNOSIS — I248 Other forms of acute ischemic heart disease: Secondary | ICD-10-CM | POA: Diagnosis present

## 2021-08-31 DIAGNOSIS — R0602 Shortness of breath: Secondary | ICD-10-CM | POA: Diagnosis present

## 2021-08-31 DIAGNOSIS — D638 Anemia in other chronic diseases classified elsewhere: Secondary | ICD-10-CM | POA: Diagnosis not present

## 2021-08-31 DIAGNOSIS — D72819 Decreased white blood cell count, unspecified: Secondary | ICD-10-CM | POA: Diagnosis present

## 2021-08-31 DIAGNOSIS — C689 Malignant neoplasm of urinary organ, unspecified: Secondary | ICD-10-CM | POA: Diagnosis not present

## 2021-08-31 DIAGNOSIS — I472 Ventricular tachycardia, unspecified: Secondary | ICD-10-CM | POA: Diagnosis present

## 2021-08-31 DIAGNOSIS — I10 Essential (primary) hypertension: Secondary | ICD-10-CM | POA: Diagnosis present

## 2021-08-31 DIAGNOSIS — C50211 Malignant neoplasm of upper-inner quadrant of right female breast: Secondary | ICD-10-CM | POA: Diagnosis present

## 2021-08-31 DIAGNOSIS — F5104 Psychophysiologic insomnia: Secondary | ICD-10-CM | POA: Diagnosis present

## 2021-08-31 DIAGNOSIS — Z906 Acquired absence of other parts of urinary tract: Secondary | ICD-10-CM | POA: Diagnosis not present

## 2021-08-31 DIAGNOSIS — C679 Malignant neoplasm of bladder, unspecified: Secondary | ICD-10-CM | POA: Diagnosis present

## 2021-08-31 DIAGNOSIS — G4700 Insomnia, unspecified: Secondary | ICD-10-CM | POA: Diagnosis not present

## 2021-08-31 LAB — LACTIC ACID, PLASMA
Lactic Acid, Venous: 0.8 mmol/L (ref 0.5–1.9)
Lactic Acid, Venous: 2.1 mmol/L (ref 0.5–1.9)
Lactic Acid, Venous: 1.9 mmol/L (ref 0.5–1.9)

## 2021-08-31 LAB — CBC WITH DIFFERENTIAL/PLATELET
Abs Immature Granulocytes: 0.01 10*3/uL (ref 0.00–0.07)
Basophils Absolute: 0 10*3/uL (ref 0.0–0.1)
Basophils Relative: 1 %
Eosinophils Absolute: 0 10*3/uL (ref 0.0–0.5)
Eosinophils Relative: 0 %
HCT: 32.1 % — ABNORMAL LOW (ref 36.0–46.0)
Hemoglobin: 10.2 g/dL — ABNORMAL LOW (ref 12.0–15.0)
Immature Granulocytes: 0 %
Lymphocytes Relative: 17 %
Lymphs Abs: 0.7 10*3/uL (ref 0.7–4.0)
MCH: 32.2 pg (ref 26.0–34.0)
MCHC: 31.8 g/dL (ref 30.0–36.0)
MCV: 101.3 fL — ABNORMAL HIGH (ref 80.0–100.0)
Monocytes Absolute: 0 10*3/uL — ABNORMAL LOW (ref 0.1–1.0)
Monocytes Relative: 1 %
Neutro Abs: 3.4 10*3/uL (ref 1.7–7.7)
Neutrophils Relative %: 81 %
Platelets: 356 10*3/uL (ref 150–400)
RBC: 3.17 MIL/uL — ABNORMAL LOW (ref 3.87–5.11)
RDW: 17.2 % — ABNORMAL HIGH (ref 11.5–15.5)
WBC: 4.1 10*3/uL (ref 4.0–10.5)
nRBC: 0 % (ref 0.0–0.2)

## 2021-08-31 LAB — COMPREHENSIVE METABOLIC PANEL
ALT: 30 U/L (ref 0–44)
AST: 61 U/L — ABNORMAL HIGH (ref 15–41)
Albumin: 3.6 g/dL (ref 3.5–5.0)
Alkaline Phosphatase: 40 U/L (ref 38–126)
Anion gap: 6 (ref 5–15)
BUN: 32 mg/dL — ABNORMAL HIGH (ref 8–23)
CO2: 26 mmol/L (ref 22–32)
Calcium: 9 mg/dL (ref 8.9–10.3)
Chloride: 109 mmol/L (ref 98–111)
Creatinine, Ser: 1.16 mg/dL — ABNORMAL HIGH (ref 0.44–1.00)
GFR, Estimated: 48 mL/min — ABNORMAL LOW (ref >60)
Glucose, Bld: 88 mg/dL (ref 70–99)
Potassium: 4.6 mmol/L (ref 3.5–5.1)
Sodium: 141 mmol/L (ref 135–145)
Total Bilirubin: 1 mg/dL (ref 0.3–1.2)
Total Protein: 6.8 g/dL (ref 6.5–8.1)

## 2021-08-31 LAB — URINALYSIS, ROUTINE W REFLEX MICROSCOPIC
Bacteria, UA: NONE SEEN
Bilirubin Urine: NEGATIVE
Glucose, UA: NEGATIVE mg/dL
Hgb urine dipstick: NEGATIVE
Ketones, ur: NEGATIVE mg/dL
Nitrite: NEGATIVE
Protein, ur: NEGATIVE mg/dL
Specific Gravity, Urine: 1.018 (ref 1.005–1.030)
pH: 5 (ref 5.0–8.0)

## 2021-08-31 LAB — APTT: aPTT: 26 seconds (ref 24–36)

## 2021-08-31 LAB — GLUCOSE, CAPILLARY: Glucose-Capillary: 81 mg/dL (ref 70–99)

## 2021-08-31 LAB — PROTIME-INR
INR: 1.5 — ABNORMAL HIGH (ref 0.8–1.2)
Prothrombin Time: 17.7 seconds — ABNORMAL HIGH (ref 11.4–15.2)

## 2021-08-31 LAB — EXPECTORATED SPUTUM ASSESSMENT W GRAM STAIN, RFLX TO RESP C

## 2021-08-31 LAB — SARS CORONAVIRUS 2 BY RT PCR: SARS Coronavirus 2 by RT PCR: NEGATIVE

## 2021-08-31 LAB — MAGNESIUM: Magnesium: 2 mg/dL (ref 1.7–2.4)

## 2021-08-31 MED ORDER — ONDANSETRON HCL 4 MG PO TABS: 4.0000 mg | ORAL_TABLET | Freq: Four times a day (QID) | ORAL | Status: DC | PRN

## 2021-08-31 MED ORDER — ALPRAZOLAM 0.5 MG PO TABS
1.5000 mg | ORAL_TABLET | Freq: Every day | ORAL | Status: DC
  Administered 2021-08-31 – 2021-09-03 (×3): 1.5 mg via ORAL
  Filled 2021-08-31 (×3): qty 3

## 2021-08-31 MED ORDER — FENOFIBRATE 160 MG PO TABS
160.0000 mg | ORAL_TABLET | Freq: Every day | ORAL | Status: DC
  Administered 2021-09-01 – 2021-09-03 (×3): 160 mg via ORAL
  Filled 2021-08-31 (×3): qty 1

## 2021-08-31 MED ORDER — VANCOMYCIN HCL 1500 MG/300ML IV SOLN: 1500.0000 mg | INTRAVENOUS | Status: DC

## 2021-08-31 MED ORDER — LEVOTHYROXINE SODIUM 112 MCG PO TABS
112.0000 ug | ORAL_TABLET | Freq: Every day | ORAL | Status: DC
  Administered 2021-09-01 – 2021-09-03 (×3): 112 ug via ORAL
  Filled 2021-08-31 (×3): qty 1

## 2021-08-31 MED ORDER — LACTATED RINGERS IV SOLN: INTRAVENOUS | Status: DC

## 2021-08-31 MED ORDER — CEFEPIME HCL 2 G IV SOLR
2.0000 g | Freq: Two times a day (BID) | INTRAVENOUS | Status: DC
  Administered 2021-09-01 – 2021-09-02 (×5): 2 g via INTRAVENOUS
  Filled 2021-08-31 (×6): qty 12.5

## 2021-08-31 MED ORDER — ALPRAZOLAM 0.5 MG PO TABS
0.5000 mg | ORAL_TABLET | Freq: Every day | ORAL | Status: DC | PRN
Start: 2021-08-31 — End: 2021-09-03

## 2021-08-31 MED ORDER — ACETAMINOPHEN 650 MG RE SUPP: 650.0000 mg | Freq: Four times a day (QID) | RECTAL | Status: DC | PRN

## 2021-08-31 MED ORDER — ACETAMINOPHEN 325 MG PO TABS
325.0000 mg | ORAL_TABLET | Freq: Once | ORAL | Status: AC
  Administered 2021-08-31: 325 mg via ORAL
  Filled 2021-08-31: qty 1

## 2021-08-31 MED ORDER — ACETAMINOPHEN 325 MG PO TABS
650.0000 mg | ORAL_TABLET | Freq: Four times a day (QID) | ORAL | Status: DC | PRN
  Administered 2021-09-01 – 2021-09-02 (×3): 650 mg via ORAL
  Filled 2021-08-31 (×3): qty 2

## 2021-08-31 MED ORDER — PANTOPRAZOLE SODIUM 40 MG PO TBEC
40.0000 mg | DELAYED_RELEASE_TABLET | Freq: Two times a day (BID) | ORAL | Status: DC
  Administered 2021-08-31 – 2021-09-03 (×6): 40 mg via ORAL
  Filled 2021-08-31 (×6): qty 1

## 2021-08-31 MED ORDER — VANCOMYCIN HCL 1500 MG/300ML IV SOLN
1500.0000 mg | Freq: Once | INTRAVENOUS | Status: AC
  Administered 2021-08-31: 1500 mg via INTRAVENOUS
  Filled 2021-08-31: qty 300

## 2021-08-31 MED ORDER — CEFEPIME HCL 2 G IV SOLR
2.0000 g | Freq: Once | INTRAVENOUS | Status: AC
  Administered 2021-08-31: 2 g via INTRAVENOUS
  Filled 2021-08-31: qty 12.5

## 2021-08-31 MED ORDER — LIDOCAINE-PRILOCAINE 2.5-2.5 % EX CREA
1.0000 | TOPICAL_CREAM | CUTANEOUS | Status: DC | PRN
  Filled 2021-08-31: qty 5

## 2021-08-31 MED ORDER — AMIODARONE HCL 200 MG PO TABS
100.0000 mg | ORAL_TABLET | Freq: Every day | ORAL | Status: DC
  Administered 2021-09-01 – 2021-09-03 (×3): 100 mg via ORAL
  Filled 2021-08-31 (×3): qty 1

## 2021-08-31 MED ORDER — DOCUSATE SODIUM 100 MG PO CAPS
200.0000 mg | ORAL_CAPSULE | ORAL | Status: DC
  Administered 2021-08-31 – 2021-09-03 (×2): 200 mg via ORAL
  Filled 2021-08-31 (×2): qty 2

## 2021-08-31 MED ORDER — LACTATED RINGERS IV BOLUS
1000.0000 mL | Freq: Once | INTRAVENOUS | Status: AC
  Administered 2021-08-31: 1000 mL via INTRAVENOUS

## 2021-08-31 MED ORDER — APIXABAN 5 MG PO TABS
5.0000 mg | ORAL_TABLET | Freq: Two times a day (BID) | ORAL | Status: DC
  Administered 2021-08-31 – 2021-09-03 (×6): 5 mg via ORAL
  Filled 2021-08-31 (×6): qty 1

## 2021-08-31 MED ORDER — ANASTROZOLE 1 MG PO TABS
1.0000 mg | ORAL_TABLET | Freq: Every day | ORAL | Status: DC
  Administered 2021-09-01 – 2021-09-03 (×2): 1 mg via ORAL
  Filled 2021-08-31 (×3): qty 1

## 2021-08-31 MED ORDER — PSYLLIUM 95 % PO PACK
1.0000 | PACK | ORAL | Status: DC
  Filled 2021-08-31: qty 1

## 2021-08-31 MED ORDER — ONDANSETRON HCL 4 MG/2ML IJ SOLN: 4.0000 mg | Freq: Four times a day (QID) | INTRAMUSCULAR | Status: DC | PRN

## 2021-08-31 MED ORDER — ACETAMINOPHEN 500 MG PO TABS
1000.0000 mg | ORAL_TABLET | Freq: Once | ORAL | Status: DC
Start: 2021-08-31 — End: 2021-08-31

## 2021-08-31 MED ORDER — FERROUS SULFATE 325 (65 FE) MG PO TABS
325.0000 mg | ORAL_TABLET | Freq: Every day | ORAL | Status: DC
  Administered 2021-08-31 – 2021-09-03 (×3): 325 mg via ORAL
  Filled 2021-08-31 (×3): qty 1

## 2021-08-31 MED ORDER — MIRTAZAPINE 15 MG PO TABS
15.0000 mg | ORAL_TABLET | Freq: Every day | ORAL | Status: DC
  Administered 2021-08-31 – 2021-09-03 (×3): 15 mg via ORAL
  Filled 2021-08-31 (×3): qty 1

## 2021-08-31 NOTE — ED Triage Notes (Signed)
Pt BIB EMS from home c/o sob, chills and weakness since this morning. Hx UTI 2-3 weeks ago per patient.   100.9 temp 84% room air  78 HR 104 cbg 198/89 bp  '650mg'$  Tylenol given en route by EMS

## 2021-08-31 NOTE — Progress Notes (Signed)
A consult was received from an ED physician for vancomycin per pharmacy dosing.  The patient's profile has been reviewed for ht/wt/allergies/indication/available labs.    A one time order has been placed for vancomycin 1500 mg IV once.  Further antibiotics/pharmacy consults should be ordered by admitting physician if indicated.                       Thank you, Lenis Noon, PharmD 08/31/2021  2:23 PM

## 2021-08-31 NOTE — Progress Notes (Signed)
Pharmacy Antibiotic Note  Cynthia [name] is a 76 y.o. female admitted on 08/31/2021 with  SOB, PNA .  Pt started on broad spectrum antibiotics - cefepime + vancomycin. Pharmacy has been consulted for vancomycin dosing.  Today, 08/31/21 WBC WNL SCr slightly elevated, CrCl 40 mL/min  Plan: Vancomycin 1500 mg IV q48h for estimated AUC of 416 Goal vancomycin AUC 400-550. Check levels at steady state as needed.  Monitor renal function Ordered MRSA PCR. If MRSA PCR negative, and only suspected source of infection is respiratory, recommend discontinue vancomycin  Height: '5\' 5"'$  (165.1 cm) Weight: 73.4 kg (161 lb 13.1 oz) IBW/kg (Calculated) : 57  Temp (24hrs), Avg:99.6 F (37.6 C), Min:98.2 F (36.8 C), Max:102.2 F (39 C)  Recent Labs  Lab 08/29/21 1125 08/31/21 1254 08/31/21 1321  WBC 3.0*  --  4.1  CREATININE 0.95  --  1.16*  LATICACIDVEN  --  0.8  --     Estimated Creatinine Clearance: 40.1 mL/min (A) (by C-G formula based on SCr of 1.16 mg/dL (H)).    No Known Allergies  Antimicrobials this admission: vancomycin 8/26 >>  cefepime 8/26 >>   Dose adjustments this admission:  Microbiology results: 8/26 BCx:  8/26 UCx:   8/26 MRSA PCR:   Lenis Noon, PharmD 08/31/2021 4:24 PM

## 2021-08-31 NOTE — Progress Notes (Signed)
A consult was received from an ED physician for cefepime per pharmacy dosing.  The patient's profile has been reviewed for ht/wt/allergies/indication/available labs.    A one time order has been placed for cefepime 2 g IV.    Further antibiotics/pharmacy consults should be ordered by admitting physician if indicated.                       Thank you, Suzzanne Cloud 08/31/2021  1:40 PM

## 2021-08-31 NOTE — ED Notes (Signed)
Pt placed on 2 L .  

## 2021-08-31 NOTE — ED Provider Notes (Signed)
[facility] DEPT Provider Note   CSN: [id number] Arrival date & time: 08/31/21  1238     History {Add pertinent medical, surgical, social history, OB history to HPI:1} No chief complaint on file.   Cynthia [name] is a 76 y.o. female.  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  8/14 diagnosed with UTI  Has been undergoing chemotherapy for bladder cancer Had infusion on Thursday, felt ok then this morning woke up with chills. Checked temperaturea nd found to be 100.9 at home. Felt short of breath and came to ED.  Reports chronic cough related to GERD, a hacking for months and chronic postnasal drip that are not changed. Denies other sore throat, headache, vomiting, diarrhea, abdominal pain, chest pain.  Reports fatigue, decreased appetite, chills, fever.  Urinary frequency is chronic.     Past Medical History:  Diagnosis Date   AICD (automatic cardioverter/defibrillator) present    Medtronic   Anticoagulant long-term use    eliquis--- managed by cardiology   Anxiety    Arthritis    Bladder cancer ([place])    Bradycardia    Cancer of kidney ([place])    left   DDD (degenerative disc disease), lumbar    Depression    Dyspnea    ON EXERTION   First degree heart block    Genetic testing 06/25/2016   Ms. Tangney underwent genetic counseling and testing for hereditary cancer syndromes on 06/17/2016. Her results were negative for mutations in all 46 genes analyzed by [facility]'s 46-gene Common Hereditary Cancers Panel. Genes analyzed include: APC, ATM, AXIN2, BARD1, BMPR1A, BRCA1, BRCA2, BRIP1, CDH1, CDKN2A, CHEK2, CTNNA1, DICER1, EPCAM, GREM1, HOXB13, KIT, MEN1, MLH1, MSH2, MSH3, MSH6, MUTYH, NBN,    GERD (gastroesophageal reflux disease)    occasional,  takes pepcid   Hematuria    History of radiation therapy 05/22/2016 to 06/20/2016   right breast cancer   Hypertension    followd by pcp---  (02-22-2019 per pt never had stress test)   Hypothyroidism    followed by pcp   IBS (irritable bowel syndrome)    Incomplete right bundle branch block    Insomnia    Malignant neoplasm of upper-inner quadrant of right breast in female, estrogen receptor positive [facility]) oncologist--- dr [name]   dx 03/ 2018,  Stage IA, IDC, ER/PR +;  04-01-2016 s/p  right breast lumpectomy w/ node dissection's;  completed radiation 06-20-2016   MVP (mitral valve prolapse)    per echo 12-11-2018 in epic, mild    NSVT (nonsustained ventricular tachycardia) ([place]) followed by cardiology   12-10-2018  hospital admission ,  refer to discharge note 12-14-2018 for treatement   PAF (paroxysmal atrial fibrillation) [facility]) primary cardiologist--- dr Margaretann Loveless   newly dx 12-10-2018  admission in epic, Afib w/ RVR and NSVT;   in epic TTE 12-11-2018 showed ef 50-55%, mild MVP,  mild AV sclerosis without stenosis;   Cardiac MRI 12-13-2018 in epic   Renal mass, left    pelvis    Restless leg syndrome    occasional   Type 2 diabetes mellitus ([place])    followed by pcp---  (02-22-2019 does not check blood sugar's)    Home Medications Prior to Admission medications   Medication Sig Start Date End  Date Taking? Authorizing Provider  acetaminophen (TYLENOL) 500 MG tablet Take 1,000 mg by mouth at bedtime.    [provider]  ALPRAZolam Duanne Moron) 1 MG tablet Take 2 mg by mouth at bedtime. 12/09/18   [provider]  amiodarone (PACERONE) 100 MG tablet Take 1 tablet (100 mg total) by mouth daily. 06/26/21   Deboraha Sprang, MD  anastrozole (ARIMIDEX) 1 MG tablet TAKE 1 TABLET BY MOUTH EVERY DAY 10/08/20   Magrinat, [name], MD  apixaban (ELIQUIS) 5 MG TABS tablet TAKE 1 TABLET BY MOUTH TWICE A DAY 01/18/21   Deboraha Sprang,  MD  Cholecalciferol (VITAMIN D3) 50 MCG (2000 UT) capsule 1 capsule    [provider]  Choline Fenofibrate (FENOFIBRIC ACID) 135 MG CPDR Take 135 mg by mouth daily.  01/16/16   [provider]  ferrous sulfate 325 (65 FE) MG tablet Take 325 mg by mouth at bedtime.    [provider]  HYDROcodone-acetaminophen (NORCO/VICODIN) 5-325 MG tablet Take 1 tablet by mouth every 6 (six) hours as needed for moderate pain or severe pain (post-operatively). Patient not taking: Reported on 08/29/2021 06/05/21 06/05/22  Alexis Frock, MD  levothyroxine (SYNTHROID, LEVOTHROID) 112 MCG tablet Take 112 mcg by mouth at bedtime.  03/13/16   [provider]  lidocaine (LIDODERM) 5 % Place 1 patch onto the skin daily as needed. Remove & Discard patch within 12 hours or as directed by MD 03/23/21   Wynona Dove A, DO  lidocaine-prilocaine (EMLA) cream Apply 1 Application topically as needed. 07/05/21   Wyatt Portela, MD  mirtazapine (REMERON) 15 MG tablet Take 15 mg by mouth at bedtime. 02/14/21   [provider]  pantoprazole (PROTONIX) 40 MG tablet Take 40 mg by mouth 2 (two) times daily. 05/20/21   [provider]  prochlorperazine (COMPAZINE) 10 MG tablet Take 1 tablet (10 mg total) by mouth every 6 (six) hours as needed for nausea or vomiting. 07/05/21   Wyatt Portela, MD  psyllium (REGULOID) 0.52 g capsule Take 3.64 g by mouth daily. 7 caps daily    [provider]  senna-docusate (SENOKOT-S) 8.6-50 MG tablet Take 1 tablet by mouth 2 (two) times daily. While taking strong pain meds to prevent constipation 06/05/21   Alexis Frock, MD      Allergies    Patient has no known allergies.    Review of Systems   Review of Systems  Physical Exam Updated Vital Signs BP (!) 150/76 (BP Location: Left Arm)   Pulse 77   Temp (!) 102.2 F (39 C) (Oral)   Resp (!) 24   SpO2 (!) 88%  Physical Exam  ED Results / Procedures / Treatments   Labs (all labs  ordered are listed, but only abnormal results are displayed) Labs Reviewed - No data to display  EKG None  Radiology No results found.  Procedures Procedures  {Document cardiac monitor, telemetry assessment procedure when appropriate:1}  Medications Ordered in ED Medications - No data to display  ED Course/ Medical Decision Making/ A&P                           Medical Decision Making Amount and/or Complexity of Data Reviewed Labs: ordered. Radiology: ordered. ECG/medicine tests: ordered.  Risk OTC drugs.   76yo female with history of bladder cancer diagnosed May 2023,history of left nephroureterectomy 2021 urothelial carcinoma, first degree heart block, GERD, prior htn, prior DM per pt,  paroxysymal atrial fibrillation on eliquis, who presents with concern for fever, chills and shortness of breath.  On arrival to the ED is hypoxic to 88%, temperature 102.  DDX includes sepsis with consideration of possible pneumonia, urinary source, bacteremia, neutropenic fever, COVID 19.  No abdominal pain, rash, headache, doubt intraabdominal/intracranial etiology of symptoms.   Ordered empiric cefepime given recent chemo, recent UTI, consideration of HCAP on arrival.  Labs obtained and personally evaluated and interpreted by me show ***   {Document critical care time when appropriate:1} {Document review of labs and clinical decision tools ie heart score, Chads2Vasc2 etc:1}  {Document your independent review of radiology images, and any outside records:1} {Document your discussion with family members, caretakers, and with consultants:1} {Document social determinants of health affecting pt's care:1} {Document your decision making why or why not admission, treatments were needed:1} Final Clinical Impression(s) / ED Diagnoses Final diagnoses:  None    Rx / DC Orders ED Discharge Orders     None

## 2021-08-31 NOTE — H&P (Signed)
History and Physical    Patient: Laneshia Pina RXY:[id number] DOB: [birth year] DOA: 08/31/2021 DOS: the patient was seen and examined on 08/31/2021 PCP: Patient, No Pcp Per  Patient coming from: Home  Chief Complaint:  Chief Complaint  Patient presents with   Shortness of Breath   HPI: Cynthia [name] is a 76 y.o. female with medical history significant of paroxysmal atrial fibrillation, incomplete RBBB, NSVT, pacemaker placement, mitral valve prolapse anxiety, arthritis, bladder cancer, depression, GERD, right breast cancer, history of radiation therapy, restless syndrome, type 2 diabetes, IBS who chemotherapy on Thursday and woke up today with dyspnea, fatigue, malaise, productive cough, chills and generalized weakness.  She was recently treated for UTI several weeks ago.  No sick contacts to her knowledge. No fever or night sweats. No sore throat, rhinorrhea, wheezing or hemoptysis.  No chest pain, palpitations, diaphoresis, PND, orthopnea or pitting edema of the lower extremities.  Her appetite is decreased, no abdominal pain, diarrhea, constipation, melena or hematochezia.  No flank pain, dysuria, frequency or hematuria.  No polyuria, polydipsia, polyphagia or blurred vision.  ED course: Initial vital signs were temperature 102.2 F, pulse 77, respiration 24, BP 150/76 mmHg and O2 sat 88% on room air.  Patient received acetaminophen 325 mg p.o., 2 g of cefepime, 1500 mg of vancomycin and 1000 mL of LR bolus.  Lab work: Urinalysis shows small leukocyte esterase but was otherwise unremarkable.  CBC is her white count 4.1, hemoglobin 10.2 g/dL and platelets 356.  PT 17.7 and INR 1.5.  CMP showed a BUN of 32 and creatinine 1.16 mg/dL.  AST was mildly elevated at 61 units/L.  The rest of the CMP measurements were unremarkable.  Lactic acid was 0.8 and then 2.1 mmol/L.  Coronavirus PCR was negative.  Imaging: One-view portable chest radiograph showed a new right mid and lower lung infiltrate.  There is  some mild haziness in the left base that is known a specific, but early opacity not excluded.  Review of Systems: As mentioned in the history of present illness. All other systems reviewed and are negative.  Past Medical History:  Diagnosis Date   AICD (automatic cardioverter/defibrillator) present    Medtronic   Anticoagulant long-term use    eliquis--- managed by cardiology   Anxiety    Arthritis    Bladder cancer ([place])    Bradycardia    Cancer of kidney ([place])    left   DDD (degenerative disc disease), lumbar    Depression    Dyspnea    ON EXERTION   First degree heart block    Genetic testing 06/25/2016   Ms. Groome underwent genetic counseling and testing for hereditary cancer syndromes on 06/17/2016. Her results were negative for mutations in all 46 genes analyzed by [facility]'s 46-gene Common Hereditary Cancers Panel. Genes analyzed include: APC, ATM, AXIN2, BARD1, BMPR1A, BRCA1, BRCA2, BRIP1, CDH1, CDKN2A, CHEK2, CTNNA1, DICER1, EPCAM, GREM1, HOXB13, KIT, MEN1, MLH1, MSH2, MSH3, MSH6, MUTYH, NBN,   GERD (gastroesophageal reflux disease)    occasional,  takes pepcid   Hematuria    History of radiation therapy 05/22/2016 to 06/20/2016   right breast cancer   Hypertension    followd by pcp---  (02-22-2019 per pt never had stress test)   Hypothyroidism    followed by pcp   IBS (irritable bowel syndrome)    Incomplete right bundle branch block    Insomnia    Malignant neoplasm of upper-inner quadrant of right breast in female, estrogen receptor positive [facility]) oncologist--- dr  magrinat   dx 03/ 2018,  Stage IA, IDC, ER/PR +;  04-01-2016 s/p  right breast lumpectomy w/ node dissection's;  completed radiation 06-20-2016   MVP (mitral valve prolapse)    per echo 12-11-2018 in epic, mild    NSVT (nonsustained ventricular tachycardia) ([place]) followed by cardiology   12-10-2018  hospital admission ,  refer to discharge note 12-14-2018 for treatement   PAF (paroxysmal atrial  fibrillation) [facility]) primary cardiologist--- dr Margaretann Loveless   newly dx 12-10-2018  admission in epic, Afib w/ RVR and NSVT;   in epic TTE 12-11-2018 showed ef 50-55%, mild MVP,  mild AV sclerosis without stenosis;   Cardiac MRI 12-13-2018 in epic   Renal mass, left    pelvis    Restless leg syndrome    occasional   Type 2 diabetes mellitus ([place])    followed by pcp---  (02-22-2019 does not check blood sugar's)   Past Surgical History:  Procedure Laterality Date   BREAST LUMPECTOMY WITH RADIOACTIVE SEED AND SENTINEL LYMPH NODE BIOPSY Right 04/01/2016   Procedure: RADIOACTIVE SEED GUIDED RIGHT BREAST LUMPECTOMY WITH RIGHT AXILLARY SENTINEL LYMPH NODE BIOPSY.;  Surgeon: Fanny Skates, MD;  Location: [place];  Service: General;  Laterality: Right;   CARDIAC CATHETERIZATION  12/11/2018   CATARACT EXTRACTION W/ INTRAOCULAR LENS  IMPLANT, BILATERAL  1980s   COLONOSCOPY     CYSTOSCOPY W/ RETROGRADES Right 06/05/2021   Procedure: CYSTOSCOPY WITH RETROGRADE PYELOGRAM;  Surgeon: Alexis Frock, MD;  Location: [facility];  Service: Urology;  Laterality: Right;   CYSTOSCOPY/RETROGRADE/URETEROSCOPY N/A 03/02/2019   Procedure: CYSTOSCOPY/LEFT RETROGRADE/URETEROSCOPY/  BIOPSY/  STENT;  Surgeon: Ceasar Mons, MD;  Location: [facility];  Service: Urology;  Laterality: N/A;   INCISIONAL HERNIA REPAIR N/A 03/29/2020   Procedure: REPAIR OF INCISIONAL HERNIA WITH MESH;  Surgeon: Erroll Luna, MD;  Location: [place];  Service: General;  Laterality: N/A;   INSERTION OF ICD     medtronic   IR IMAGING GUIDED PORT INSERTION  07/18/2021   LAPAROSCOPIC CHOLECYSTECTOMY  1990s   ROBOT ASSITED LAPAROSCOPIC NEPHROURETERECTOMY Left 04/27/2019   Procedure: XI ROBOT ASSITED LAPAROSCOPIC NEPHROURETERECTOMY;  Surgeon: Alexis Frock, MD;  Location: [facility];  Service: Urology;  Laterality: Left;  3.5 HRS   TONSILLECTOMY  age 48   TRANSURETHRAL RESECTION OF BLADDER TUMOR N/A 06/05/2021   Procedure: TRANSURETHRAL  RESECTION OF BLADDER TUMOR (TURBT);  Surgeon: Alexis Frock, MD;  Location: [facility];  Service: Urology;  Laterality: N/A;   Social History:  reports that she has never smoked. She has never used smokeless tobacco. She reports current alcohol use. She reports that she does not use drugs.  No Known Allergies  Family History  Problem Relation Age of Onset   Breast cancer Sister 28       d.54   Leukemia Brother 59   Cervical cancer Sister 42   Ovarian cancer Maternal Aunt 92       d.92s   Breast cancer Other 55    Prior to Admission medications   Medication Sig Start Date End Date Taking? Authorizing Provider  METAMUCIL 0.52 g capsule Take 0.52 g by mouth daily.   Yes [provider]  acetaminophen (TYLENOL) 500 MG tablet Take 1,000 mg by mouth at bedtime.    [provider]  ALPRAZolam Duanne Moron) 1 MG tablet Take 2 mg by mouth at bedtime. 12/09/18   [provider]  amiodarone (PACERONE) 100 MG tablet Take 1 tablet (100 mg total) by mouth daily. 06/26/21  Deboraha Sprang, MD  anastrozole (ARIMIDEX) 1 MG tablet TAKE 1 TABLET BY MOUTH EVERY DAY 10/08/20   Magrinat, [name], MD  apixaban (ELIQUIS) 5 MG TABS tablet TAKE 1 TABLET BY MOUTH TWICE A DAY 01/18/21   Deboraha Sprang, MD  Cholecalciferol (VITAMIN D3) 50 MCG (2000 UT) capsule 1 capsule    [provider]  Choline Fenofibrate (FENOFIBRIC ACID) 135 MG CPDR Take 135 mg by mouth daily.  01/16/16   [provider]  ferrous sulfate 325 (65 FE) MG tablet Take 325 mg by mouth at bedtime.    [provider]  HYDROcodone-acetaminophen (NORCO/VICODIN) 5-325 MG tablet Take 1 tablet by mouth every 6 (six) hours as needed for moderate pain or severe pain (post-operatively). Patient not taking: Reported on 08/29/2021 06/05/21 06/05/22  Alexis Frock, MD  levothyroxine (SYNTHROID, LEVOTHROID) 112 MCG tablet Take 112 mcg by mouth at bedtime.  03/13/16   [provider]  lidocaine (LIDODERM) 5 %  Place 1 patch onto the skin daily as needed. Remove & Discard patch within 12 hours or as directed by MD 03/23/21   Wynona Dove A, DO  lidocaine-prilocaine (EMLA) cream Apply 1 Application topically as needed. 07/05/21   Wyatt Portela, MD  mirtazapine (REMERON) 15 MG tablet Take 15 mg by mouth at bedtime. 02/14/21   [provider]  pantoprazole (PROTONIX) 40 MG tablet Take 40 mg by mouth 2 (two) times daily. 05/20/21   [provider]  prochlorperazine (COMPAZINE) 10 MG tablet Take 1 tablet (10 mg total) by mouth every 6 (six) hours as needed for nausea or vomiting. 07/05/21   Wyatt Portela, MD  psyllium (REGULOID) 0.52 g capsule Take 3.64 g by mouth daily. 7 caps daily    [provider]  senna-docusate (SENOKOT-S) 8.6-50 MG tablet Take 1 tablet by mouth 2 (two) times daily. While taking strong pain meds to prevent constipation 06/05/21   Alexis Frock, MD    Physical Exam: Vitals:   08/31/21 1259 08/31/21 1316 08/31/21 1430 08/31/21 1434  BP: (!) 150/76  (!) 149/68   Pulse: 77  65   Resp: (!) 24  (!) 23   Temp: (!) 102.2 F (39 C)   98.2 F (36.8 C)  TempSrc: Oral     SpO2: (!) 88%  100%   Weight:  73.4 kg    Height:  5' 5"  (1.651 m)     Physical Exam Vitals and nursing note reviewed.  Constitutional:      General: She is awake. She is not in acute distress.    Appearance: She is well-developed and normal weight. She is not ill-appearing.  HENT:     Head: Normocephalic.     Mouth/Throat:     Mouth: Mucous membranes are moist.  Eyes:     General: No scleral icterus.    Pupils: Pupils are equal, round, and reactive to light.  Neck:     Vascular: No JVD.  Cardiovascular:     Rate and Rhythm: Normal rate and regular rhythm.  Pulmonary:     Effort: Pulmonary effort is normal. No accessory muscle usage or respiratory distress.     Breath sounds: Examination of the right-lower field reveals decreased breath sounds and rales. Examination of the  left-lower field reveals rales. Decreased breath sounds and rales present. No wheezing or rhonchi.  Abdominal:     General: Bowel sounds are normal.     Palpations: Abdomen is soft.  Musculoskeletal:     Cervical  back: Neck supple.     Right lower leg: No edema.     Left lower leg: No edema.  Skin:    General: Skin is warm and dry.  Neurological:     General: No focal deficit present.     Mental Status: She is alert and oriented to person, place, and time.  Psychiatric:        Mood and Affect: Mood normal. Mood is not anxious.        Behavior: Behavior normal. Behavior is not agitated. Behavior is cooperative.   Data Reviewed:  There are no new results to review at this time.  Assessment and Plan: Principal Problem:   Multifocal pneumonia Admit to PCU/inpatient. Continue IV fluids. Continue cefepime 2 g every 12 hours.   Continue vancomycin per pharmacy. Follow-up blood culture and sensitivity Follow CBC and CMP in a.m. Check a sputum Gram stain, culture and sensitivity. Check strep pneumoniae urinary antigen.  Active Problems:   HTN (hypertension) Currently not on medical therapy. Monitor blood pressure. As needed antihypertensive.    Diabetes type 2, controlled ([place]) Carbohydrate modified diet. CBG monitoring before meals and bedtime    Paroxysmal atrial fibrillation (HCC) CHA2DS2-VASc Score of at least 6. Continue amiodarone 100 mg p.o. daily. Continue apixaban 5 mg p.o. twice daily.    Hypothyroidism Continue levothyroxine 112 mcg p.o. daily.    Urothelial cancer [facility]) Follow-up with oncology next month as scheduled.    Hyperlipidemia   Aortic atherosclerosis (HCC) Continue fenofibrate.    Advance Care Planning:   Code Status: Full Code   Consults:   Family Communication:   Severity of Illness: The appropriate patient status for this patient is OBSERVATION. Observation status is judged to be reasonable and necessary in order to provide the  required intensity of service to ensure the patient's safety. The patient's presenting symptoms, physical exam findings, and initial radiographic and laboratory data in the context of their medical condition is felt to place them at decreased risk for further clinical deterioration. Furthermore, it is anticipated that the patient will be medically stable for discharge from the hospital within 2 midnights of admission.   Author: Reubin Milan, MD 08/31/2021 3:25 PM  For on call review [email or web address].   This document was prepared using Dragon voice recognition software and may contain some unintended transcription errors.

## 2021-09-01 DIAGNOSIS — E039 Hypothyroidism, unspecified: Secondary | ICD-10-CM

## 2021-09-01 DIAGNOSIS — C689 Malignant neoplasm of urinary organ, unspecified: Secondary | ICD-10-CM

## 2021-09-01 DIAGNOSIS — I480 Paroxysmal atrial fibrillation: Secondary | ICD-10-CM

## 2021-09-01 DIAGNOSIS — J189 Pneumonia, unspecified organism: Secondary | ICD-10-CM | POA: Diagnosis not present

## 2021-09-01 LAB — COMPREHENSIVE METABOLIC PANEL
ALT: 36 U/L (ref 0–44)
AST: 57 U/L — ABNORMAL HIGH (ref 15–41)
Albumin: 3.3 g/dL — ABNORMAL LOW (ref 3.5–5.0)
Alkaline Phosphatase: 40 U/L (ref 38–126)
Anion gap: 7 (ref 5–15)
BUN: 25 mg/dL — ABNORMAL HIGH (ref 8–23)
CO2: 25 mmol/L (ref 22–32)
Calcium: 8.7 mg/dL — ABNORMAL LOW (ref 8.9–10.3)
Chloride: 106 mmol/L (ref 98–111)
Creatinine, Ser: 1.21 mg/dL — ABNORMAL HIGH (ref 0.44–1.00)
GFR, Estimated: 46 mL/min — ABNORMAL LOW (ref >60)
Glucose, Bld: 82 mg/dL (ref 70–99)
Potassium: 5.2 mmol/L — ABNORMAL HIGH (ref 3.5–5.1)
Sodium: 138 mmol/L (ref 135–145)
Total Bilirubin: 1.4 mg/dL — ABNORMAL HIGH (ref 0.3–1.2)
Total Protein: 6.4 g/dL — ABNORMAL LOW (ref 6.5–8.1)

## 2021-09-01 LAB — CBC
HCT: 28.1 % — ABNORMAL LOW (ref 36.0–46.0)
Hemoglobin: 8.8 g/dL — ABNORMAL LOW (ref 12.0–15.0)
MCH: 32.2 pg (ref 26.0–34.0)
MCHC: 31.3 g/dL (ref 30.0–36.0)
MCV: 102.9 fL — ABNORMAL HIGH (ref 80.0–100.0)
Platelets: 401 10*3/uL — ABNORMAL HIGH (ref 150–400)
RBC: 2.73 MIL/uL — ABNORMAL LOW (ref 3.87–5.11)
RDW: 17.2 % — ABNORMAL HIGH (ref 11.5–15.5)
WBC: 6.5 10*3/uL (ref 4.0–10.5)
nRBC: 0 % (ref 0.0–0.2)

## 2021-09-01 LAB — GLUCOSE, CAPILLARY
Glucose-Capillary: 79 mg/dL (ref 70–99)
Glucose-Capillary: 88 mg/dL (ref 70–99)
Glucose-Capillary: 87 mg/dL (ref 70–99)
Glucose-Capillary: 96 mg/dL (ref 70–99)

## 2021-09-01 LAB — TSH: TSH: 2.289 u[IU]/mL (ref 0.350–4.500)

## 2021-09-01 LAB — TROPONIN I (HIGH SENSITIVITY)
Troponin I (High Sensitivity): 63 ng/L — ABNORMAL HIGH (ref <18)
Troponin I (High Sensitivity): 66 ng/L — ABNORMAL HIGH (ref <18)

## 2021-09-01 LAB — EXPECTORATED SPUTUM ASSESSMENT W GRAM STAIN, RFLX TO RESP C

## 2021-09-01 LAB — MRSA NEXT GEN BY PCR, NASAL: MRSA by PCR Next Gen: NOT DETECTED

## 2021-09-01 LAB — URINE CULTURE: Culture: NO GROWTH

## 2021-09-01 MED ORDER — DOXYCYCLINE HYCLATE 100 MG PO TABS
100.0000 mg | ORAL_TABLET | Freq: Two times a day (BID) | ORAL | Status: DC
  Administered 2021-09-01 – 2021-09-03 (×5): 100 mg via ORAL
  Filled 2021-09-01 (×5): qty 1

## 2021-09-01 MED ORDER — AZITHROMYCIN 250 MG PO TABS: 500.0000 mg | ORAL_TABLET | Freq: Every day | ORAL | Status: DC

## 2021-09-01 MED ORDER — METOPROLOL TARTRATE 5 MG/5ML IV SOLN
5.0000 mg | Freq: Once | INTRAVENOUS | Status: AC
Start: 2021-09-01 — End: 2021-09-01
  Administered 2021-09-01: 5 mg via INTRAVENOUS
  Filled 2021-09-01: qty 5

## 2021-09-01 NOTE — Progress Notes (Signed)
PROGRESS NOTE    Cynthia [name]  KKX:[id number] DOB: 11-Feb-1945 DOA: 08/31/2021 PCP: Patient, No Pcp Per   Brief Narrative:  76 y.o. female with medical history significant of paroxysmal atrial fibrillation, incomplete RBBB, NSVT, pacemaker placement, mitral valve prolapse anxiety, arthritis, depression, GERD, right breast cancer, restless syndrome, type 2 diabetes, IBS, bladder cancer diagnosed in May 2023 currently undergoing chemotherapy (recently had second cycle on 08/07/2021) by Dr. Alen Blew presented with worsening shortness of breath.  On presentation, she was saturating 88% on room air.  COVID testing was negative.  Chest x-ray showed new right mid and lower lung infiltrate and haziness in the left base.  She was started on broad-spectrum antibiotics.  Assessment & Plan:   Multifocal pneumonia Acute hypoxic respiratory failure -Was saturating 88% on room air on presentation.  Currently on 2 L oxygen via milligrams.  Wean off as able.  COVID testing negative.  Imaging as above. -Continue broad-spectrum antibiotics.  Add Zithromax. -Strep pneumo urinary antigen pending  Positive troponin -Possibly from demand ischemia.  No chest pain.  Troponins did not trend up.  No further work-up needed  Multifocal bladder tumor -Diagnosed in May 2023.  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  Currently follows up with Dr. Jennefer Bravo.  Received second cycle of chemotherapy on 08/07/2021.  Outpatient follow-up with oncology.  Will add oncology to care teams.  Anemia of chronic disease -From cancer and chemotherapy.  Hemoglobin stable.  Monitor intermittently  Macrocytosis -Questionable cause.  Outpatient follow-up  Leukopenia -Resolved.  Monitor intermittently  Thrombocytosis -Possibly reactive.  Monitor intermittently  Hypertension -Monitor blood pressure.  Not on any medications at home.  Paroxysmal A-fib -Currently rate controlled.  Continue amiodarone and apixaban.   Outpatient follow-up with cardiology  Hypothyroidism -Continue levothyroxine  Hyperlipidemia Aortic atherosclerosis -Continue fenofibrate  Diabetes mellitus type 2 -Diet trolled.  Blood sugars currently stable  Physical deconditioning -PT eval   DVT prophylaxis: Eliquis Code Status: Full Family Communication: None at bedside Disposition Plan: Status is: Inpatient Remains inpatient appropriate because: Of severity of illness   Consultants: None  Procedures: None  Antimicrobials:  Anti-infectives (From admission, onward)    Start     Dose/Rate Route Frequency Ordered Stop   09/02/21 1400  vancomycin (VANCOREADY) IVPB 1500 mg/300 mL        1,500 mg 150 mL/hr over 120 Minutes Intravenous Every 48 hours 08/31/21 1629     09/01/21 0200  ceFEPIme (MAXIPIME) 2 g in sodium chloride 0.9 % 100 mL IVPB        2 g 200 mL/hr over 30 Minutes Intravenous Every 12 hours 08/31/21 1528     08/31/21 1430  vancomycin (VANCOREADY) IVPB 1500 mg/300 mL        1,500 mg 150 mL/hr over 120 Minutes Intravenous  Once 08/31/21 1420 08/31/21 1725   08/31/21 1345  ceFEPIme (MAXIPIME) 2 g in sodium chloride 0.9 % 100 mL IVPB        2 g 200 mL/hr over 30 Minutes Intravenous  Once 08/31/21 1339 08/31/21 1502        Subjective: Patient seen and examined at bedside.  No fever, vomiting, worsening shortness of breath reported.  Still feels weak and tired.  Did not get any sleep last night.  Objective: Vitals:   09/01/21 0600 09/01/21 0708 09/01/21 0730 09/01/21 0858  BP:  (!) 147/69  (!) 120/52  Pulse:  64  65  Resp: 15 (!) '22 20 20  '$ Temp:  99.3 F (37.4 C)  98.2 F (36.8 C)  TempSrc:  Oral  Oral  SpO2:  99%  98%  Weight:      Height:        Intake/Output Summary (Last 24 hours) at 09/01/2021 1056 Last data filed at 09/01/2021 1000 Gross per 24 hour  Intake 3621.36 ml  Output 950 ml  Net 2671.36 ml   Filed Weights   08/31/21 1316  Weight: 73.4 kg    Examination:  General  exam: Appears calm and comfortable.  Elderly female lying in bed.  Currently on 2 L oxygen by nasal cannula. Respiratory system: Bilateral decreased breath sounds at bases with scattered crackles and intermittent tachypnea Cardiovascular system: S1 & S2 heard, Rate controlled Gastrointestinal system: Abdomen is nondistended, soft and nontender. Normal bowel sounds heard. Extremities: No cyanosis, clubbing; trace lower extremity edema Central nervous system: Alert and oriented. No focal neurological deficits. Moving extremities Skin: No rashes, lesions or ulcers Psychiatry: Judgement and insight appear normal. Mood & affect appropriate.     Data Reviewed: I have personally reviewed following labs and imaging studies  CBC: Recent Labs  Lab 08/29/21 1125 08/31/21 1321 09/01/21 0535  WBC 3.0* 4.1 6.5  NEUTROABS 1.2* 3.4  --   HGB 9.5* 10.2* 8.8*  HCT 28.7* 32.1* 28.1*  MCV 97.3 101.3* 102.9*  PLT 444* 356 332*   Basic Metabolic Panel: Recent Labs  Lab 08/29/21 1125 08/31/21 1321 09/01/21 0535  NA 141 141 138  K 4.1 4.6 5.2*  CL 107 109 106  CO2 '30 26 25  '$ GLUCOSE 110* 88 82  BUN 19 32* 25*  CREATININE 0.95 1.16* 1.21*  CALCIUM 9.2 9.0 8.7*  MG  --  2.0  --    GFR: Estimated Creatinine Clearance: 38.5 mL/min (A) (by C-G formula based on SCr of 1.21 mg/dL (H)). Liver Function Tests: Recent Labs  Lab 08/29/21 1125 08/31/21 1321 09/01/21 0535  AST 21 61* 57*  ALT 13 30 36  ALKPHOS 46 40 40  BILITOT 0.3 1.0 1.4*  PROT 6.4* 6.8 6.4*  ALBUMIN 3.8 3.6 3.3*   No results for input(s): "LIPASE", "AMYLASE" in the last 168 hours. No results for input(s): "AMMONIA" in the last 168 hours. Coagulation Profile: Recent Labs  Lab 08/31/21 1321  INR 1.5*   Cardiac Enzymes: No results for input(s): "CKTOTAL", "CKMB", "CKMBINDEX", "TROPONINI" in the last 168 hours. BNP (last 3 results) No results for input(s): "PROBNP" in the last 8760 hours. HbA1C: No results for input(s):  "HGBA1C" in the last 72 hours. CBG: Recent Labs  Lab 08/31/21 2248 09/01/21 0734  GLUCAP 81 79   Lipid Profile: No results for input(s): "CHOL", "HDL", "LDLCALC", "TRIG", "CHOLHDL", "LDLDIRECT" in the last 72 hours. Thyroid Function Tests: Recent Labs    09/01/21 0535  TSH 2.289   Anemia Panel: No results for input(s): "VITAMINB12", "FOLATE", "FERRITIN", "TIBC", "IRON", "RETICCTPCT" in the last 72 hours. Sepsis Labs: Recent Labs  Lab 08/31/21 1254 08/31/21 1621 08/31/21 2105  LATICACIDVEN 0.8 2.1* 1.9    Recent Results (from the past 240 hour(s))  Blood Culture (routine x 2)     Status: None (Preliminary result)   Collection Time: 08/31/21  1:00 PM   Specimen: BLOOD  Result Value Ref Range Status   Specimen Description   Final    BLOOD SITE NOT SPECIFIED Performed at [facility] [address], [place], [place] [address]    Special Requests   Final    BOTTLES DRAWN AEROBIC AND ANAEROBIC Blood Culture  adequate volume Performed at [facility] [address] [place], [place] [address]    Culture   Final    NO GROWTH < 24 HOURS Performed at [facility] [address], [place], [place] [address]    Report Status PENDING  Incomplete  Blood Culture (routine x 2)     Status: None (Preliminary result)   Collection Time: 08/31/21  1:06 PM   Specimen: BLOOD  Result Value Ref Range Status   Specimen Description   Final    BLOOD SITE NOT SPECIFIED Performed at [facility] [address] [place], [place] [address]    Special Requests   Final    BOTTLES DRAWN AEROBIC AND ANAEROBIC Blood Culture results may not be optimal due to an inadequate volume of blood received in culture bottles Performed at [facility] [address], [place], [place] [address]    Culture   Final    NO GROWTH < 24 HOURS Performed at [facility] [address] [place], [place] [address]    Report  Status PENDING  Incomplete  SARS Coronavirus 2 by RT PCR (hospital order, performed in [facility] hospital lab) *cepheid single result test* Anterior Nasal Swab     Status: None   Collection Time: 08/31/21  1:22 PM   Specimen: Anterior Nasal Swab  Result Value Ref Range Status   SARS Coronavirus 2 by RT PCR NEGATIVE NEGATIVE Final    Comment: (NOTE) SARS-CoV-2 target nucleic acids are NOT DETECTED.  The SARS-CoV-2 RNA is generally detectable in upper and lower respiratory specimens during the acute phase of infection. The lowest concentration of SARS-CoV-2 viral copies this assay can detect is 250 copies / mL. A negative result does not preclude SARS-CoV-2 infection and should not be used as the sole basis for treatment or other patient management decisions.  A negative result may occur with improper specimen collection / handling, submission of specimen other than nasopharyngeal swab, presence of viral mutation(s) within the areas targeted by this assay, and inadequate number of viral copies (<250 copies / mL). A negative result must be combined with clinical observations, patient history, and epidemiological information.  Fact Sheet for Patients:   https://www.patel.info/  Fact Sheet for Healthcare Providers: https://hall.com/  This test is not yet approved or  cleared by the [place] FDA and has been authorized for detection and/or diagnosis of SARS-CoV-2 by FDA under an Emergency Use Authorization (EUA).  This EUA will remain in effect (meaning this test can be used) for the duration of the COVID-19 declaration under Section 564(b)(1) of the Act, 21 U.S.C. section 360bbb-3(b)(1), unless the authorization is terminated or revoked sooner.  Performed at [facility], [place] [address], [place], [place] [address]   Expectorated Sputum Assessment w Gram Stain, Rflx to Resp Cult     Status: None   Collection Time:  08/31/21  9:33 PM   Specimen: Sputum  Result Value Ref Range Status   Specimen Description SPU  Final   Special Requests NONE  Final   Sputum evaluation   Final    Sputum specimen not acceptable for testing.  Please recollect.   NOTIFIED RN [facility] AT 2150 08/31/21 CRUICKSHANK A Performed at [facility] [place] [address] [place], [place] [address]    Report Status 08/31/2021 FINAL  Final  MRSA Next Gen by PCR, Nasal     Status: None   Collection Time: 09/01/21  2:02  AM   Specimen: Nasal Mucosa; Nasal Swab  Result Value Ref Range Status   MRSA by PCR Next Gen NOT DETECTED NOT DETECTED Final    Comment: (NOTE) The GeneXpert MRSA Assay (FDA approved for NASAL specimens only), is one component of a comprehensive MRSA colonization surveillance program. It is not intended to diagnose MRSA infection nor to guide or monitor treatment for MRSA infections. Test performance is not FDA approved in patients less than 50 years old. Performed at [facility], [place] [address] [place], [place] [address]          Radiology Studies: [facility] Chest Port 1 View  Result Date: 08/31/2021 CLINICAL DATA:  Questionable sepsis. EXAM: PORTABLE CHEST 1 VIEW COMPARISON:  August 19, 2021 FINDINGS: New infiltrate in the right mid and lower lung. Mild haziness in the left base. Stable cardiomegaly. The hila and mediastinum are unremarkable. The right Port-A-Cath is stable. No pneumothorax. No other acute abnormalities. IMPRESSION: New right mid and lower lung infiltrate. Mild haziness in the left base is nonspecific but early opacity not excluded. The findings are concerning for multifocal pneumonia given history. Electronically Signed   By: Dorise Bullion III M.D.   On: 08/31/2021 14:00        Scheduled Meds:  ALPRAZolam  1.5 mg Oral QHS   amiodarone  100 mg Oral Daily   anastrozole  1 mg Oral Daily   apixaban  5 mg Oral BID   docusate sodium  200 mg Oral QODAY    fenofibrate  160 mg Oral Daily   ferrous sulfate  325 mg Oral QHS   levothyroxine  112 mcg Oral QAC breakfast   mirtazapine  15 mg Oral QHS   pantoprazole  40 mg Oral BID   psyllium  1 packet Oral QODAY   Continuous Infusions:  ceFEPime (MAXIPIME) IV 2 g (09/01/21 0200)   [START ON 09/02/2021] vancomycin            Aline August, MD [facility] 09/01/2021, 10:56 AM

## 2021-09-01 NOTE — Plan of Care (Signed)

## 2021-09-01 NOTE — Progress Notes (Signed)
   09/01/21 0525  Provider Notification  Provider Name/Title Dr. Hal Hope  Date Provider Notified 09/01/21  Time Provider Notified [phone]  Method of Notification Call  Notification Reason Other (Comment) (MEWS 2)  Provider response See new orders  Date of Provider Response 09/01/21  Time of Provider Response [phone]

## 2021-09-02 DIAGNOSIS — R778 Other specified abnormalities of plasma proteins: Secondary | ICD-10-CM

## 2021-09-02 DIAGNOSIS — J189 Pneumonia, unspecified organism: Secondary | ICD-10-CM | POA: Diagnosis not present

## 2021-09-02 DIAGNOSIS — D638 Anemia in other chronic diseases classified elsewhere: Secondary | ICD-10-CM

## 2021-09-02 DIAGNOSIS — C689 Malignant neoplasm of urinary organ, unspecified: Secondary | ICD-10-CM | POA: Diagnosis not present

## 2021-09-02 DIAGNOSIS — D75839 Thrombocytosis, unspecified: Secondary | ICD-10-CM

## 2021-09-02 DIAGNOSIS — R7989 Other specified abnormal findings of blood chemistry: Secondary | ICD-10-CM

## 2021-09-02 DIAGNOSIS — J9601 Acute respiratory failure with hypoxia: Principal | ICD-10-CM

## 2021-09-02 DIAGNOSIS — I480 Paroxysmal atrial fibrillation: Secondary | ICD-10-CM | POA: Diagnosis not present

## 2021-09-02 LAB — COMPREHENSIVE METABOLIC PANEL
ALT: 27 U/L (ref 0–44)
AST: 36 U/L (ref 15–41)
Albumin: 3 g/dL — ABNORMAL LOW (ref 3.5–5.0)
Alkaline Phosphatase: 37 U/L — ABNORMAL LOW (ref 38–126)
Anion gap: 7 (ref 5–15)
BUN: 19 mg/dL (ref 8–23)
CO2: 29 mmol/L (ref 22–32)
Calcium: 8.6 mg/dL — ABNORMAL LOW (ref 8.9–10.3)
Chloride: 106 mmol/L (ref 98–111)
Creatinine, Ser: 0.97 mg/dL (ref 0.44–1.00)
GFR, Estimated: 60 mL/min — ABNORMAL LOW (ref >60)
Glucose, Bld: 100 mg/dL — ABNORMAL HIGH (ref 70–99)
Potassium: 4.2 mmol/L (ref 3.5–5.1)
Sodium: 142 mmol/L (ref 135–145)
Total Bilirubin: 0.7 mg/dL (ref 0.3–1.2)
Total Protein: 5.8 g/dL — ABNORMAL LOW (ref 6.5–8.1)

## 2021-09-02 LAB — CBC WITH DIFFERENTIAL/PLATELET
Abs Immature Granulocytes: 0.09 10*3/uL — ABNORMAL HIGH (ref 0.00–0.07)
Basophils Absolute: 0 10*3/uL (ref 0.0–0.1)
Basophils Relative: 0 %
Eosinophils Absolute: 0.1 10*3/uL (ref 0.0–0.5)
Eosinophils Relative: 3 %
HCT: 26.1 % — ABNORMAL LOW (ref 36.0–46.0)
Hemoglobin: 8.1 g/dL — ABNORMAL LOW (ref 12.0–15.0)
Immature Granulocytes: 2 %
Lymphocytes Relative: 13 %
Lymphs Abs: 0.6 10*3/uL — ABNORMAL LOW (ref 0.7–4.0)
MCH: 31.6 pg (ref 26.0–34.0)
MCHC: 31 g/dL (ref 30.0–36.0)
MCV: 102 fL — ABNORMAL HIGH (ref 80.0–100.0)
Monocytes Absolute: 0.1 10*3/uL (ref 0.1–1.0)
Monocytes Relative: 2 %
Neutro Abs: 3.7 10*3/uL (ref 1.7–7.7)
Neutrophils Relative %: 80 %
Platelets: 444 10*3/uL — ABNORMAL HIGH (ref 150–400)
RBC: 2.56 MIL/uL — ABNORMAL LOW (ref 3.87–5.11)
RDW: 17.2 % — ABNORMAL HIGH (ref 11.5–15.5)
WBC: 4.6 10*3/uL (ref 4.0–10.5)
nRBC: 0 % (ref 0.0–0.2)

## 2021-09-02 LAB — MAGNESIUM: Magnesium: 2 mg/dL (ref 1.7–2.4)

## 2021-09-02 LAB — RESPIRATORY PANEL BY PCR

## 2021-09-02 LAB — GLUCOSE, CAPILLARY
Glucose-Capillary: 85 mg/dL (ref 70–99)
Glucose-Capillary: 117 mg/dL — ABNORMAL HIGH (ref 70–99)
Glucose-Capillary: 128 mg/dL — ABNORMAL HIGH (ref 70–99)
Glucose-Capillary: 161 mg/dL — ABNORMAL HIGH (ref 70–99)

## 2021-09-02 LAB — STREP PNEUMONIAE URINARY ANTIGEN: Strep Pneumo Urinary Antigen: NEGATIVE

## 2021-09-02 MED ORDER — ALBUTEROL SULFATE (2.5 MG/3ML) 0.083% IN NEBU: 2.5000 mg | INHALATION_SOLUTION | RESPIRATORY_TRACT | Status: DC | PRN

## 2021-09-02 MED ORDER — IPRATROPIUM-ALBUTEROL 0.5-2.5 (3) MG/3ML IN SOLN
3.0000 mL | Freq: Four times a day (QID) | RESPIRATORY_TRACT | Status: DC
  Administered 2021-09-02: 3 mL via RESPIRATORY_TRACT
  Filled 2021-09-02: qty 3

## 2021-09-02 MED ORDER — IPRATROPIUM-ALBUTEROL 0.5-2.5 (3) MG/3ML IN SOLN: 3.0000 mL | Freq: Three times a day (TID) | RESPIRATORY_TRACT | Status: DC

## 2021-09-02 MED ORDER — IPRATROPIUM-ALBUTEROL 0.5-2.5 (3) MG/3ML IN SOLN
3.0000 mL | Freq: Three times a day (TID) | RESPIRATORY_TRACT | Status: DC
  Administered 2021-09-02 – 2021-09-03 (×3): 3 mL via RESPIRATORY_TRACT
  Filled 2021-09-02 (×3): qty 3

## 2021-09-02 MED ORDER — FUROSEMIDE 10 MG/ML IJ SOLN
40.0000 mg | Freq: Once | INTRAMUSCULAR | Status: AC
  Administered 2021-09-02: 40 mg via INTRAVENOUS
  Filled 2021-09-02: qty 4

## 2021-09-02 MED ORDER — TRAZODONE HCL 50 MG PO TABS
50.0000 mg | ORAL_TABLET | Freq: Every day | ORAL | Status: DC
  Administered 2021-09-03: 50 mg via ORAL
  Filled 2021-09-02: qty 1

## 2021-09-02 NOTE — Evaluation (Signed)
Physical Therapy Evaluation Patient Details Name: Cynthia [name] MRN: [id number] DOB: 19-Jul-1945 Today's Date: 09/02/2021  History of Present Illness  76 yo female admitted with Pna. Hx of bladder ca, HB, DM, Afib, breast ca, pacemaker, OA  Clinical Impression  On eval, pt was Min guard A for mobility. She walked to and from bathroom several times during session. O2 89% on RA rest, 85% on RA with ambulation, 96% on 3L with ambulation. Pt tolerated activity fairly well. Daughter, who is a Therapist, sports, was present. Discussed d/c plan-pt will return home after discharge. She politely declines any f/u PT and assistive device at this time. Will plan to follow and progress activity as tolerated.        Recommendations for follow up therapy are one component of a multi-disciplinary discharge planning process, led by the attending physician.  Recommendations may be updated based on patient status, additional functional criteria and insurance authorization.  Follow Up Recommendations No PT follow up (pt politely declines)      Assistance Recommended at Discharge PRN  Patient can return home with the following  A little help with walking and/or transfers    Equipment Recommendations None recommended by PT (pt politely declines)  Recommendations for Other Services       Functional Status Assessment Patient has had a recent decline in their functional status and demonstrates the ability to make significant improvements in function in a reasonable and predictable amount of time.     Precautions / Restrictions Precautions Precautions: Fall Restrictions Weight Bearing Restrictions: No      Mobility  Bed Mobility Overal bed mobility: Modified Independent                  Transfers Overall transfer level: Needs assistance   Transfers: Sit to/from Stand Sit to Stand: Modified independent (Device/Increase time)                Ambulation/Gait Ambulation/Gait assistance: Min guard Gait  Distance (Feet): 50 Feet Assistive device: IV Pole (vs "furniture cruising")         General Gait Details: Min guard for safety. O2 85% on RA, 96% on 3L. Pt walked to and from bathroom a couple of times.  Stairs            Wheelchair Mobility    Modified Rankin (Stroke Patients Only)       Balance Overall balance assessment: Needs assistance         Standing balance support: During functional activity                                 Pertinent Vitals/Pain Pain Assessment Pain Assessment: No/denies pain    Home Living Family/patient expects to be discharged to:: Private residence Living Arrangements: Spouse/significant other Available Help at Discharge: Family Type of Home: House Home Access: Stairs to enter   Technical brewer of Steps: 1 threshold   Home Layout: One level Home Equipment: None Additional Comments: pt and daughter reports pt's husband has a rollator    Prior Function Prior Level of Function : Independent/Modified Independent             Mobility Comments: mod ind per pt and daughter ADLs Comments: mod ind     Hand Dominance        Extremity/Trunk Assessment   Upper Extremity Assessment Upper Extremity Assessment: Overall WFL for tasks assessed    Lower Extremity Assessment Lower Extremity Assessment:  Generalized weakness    Cervical / Trunk Assessment Cervical / Trunk Assessment: Kyphotic (hx of scoliosis)  Communication   Communication: No difficulties  Cognition Arousal/Alertness: Awake/alert Behavior During Therapy: WFL for tasks assessed/performed Overall Cognitive Status: Within Functional Limits for tasks assessed                                          General Comments      Exercises     Assessment/Plan    PT Assessment Patient needs continued PT services  PT Problem List Decreased strength;Decreased mobility;Decreased activity tolerance;Decreased balance;Decreased  knowledge of use of DME       PT Treatment Interventions Gait training;Therapeutic activities;Therapeutic exercise;Patient/family education;Balance training;Functional mobility training;DME instruction    PT Goals (Current goals can be found in the Care Plan section)  Acute Rehab PT Goals Patient Stated Goal: home soon PT Goal Formulation: With patient/family Time For Goal Achievement: 09/16/21 Potential to Achieve Goals: Good    Frequency Min 3X/week     Co-evaluation               AM-PAC PT "6 Clicks" Mobility  Outcome Measure Help needed turning from your back to your side while in a flat bed without using bedrails?: None Help needed moving from lying on your back to sitting on the side of a flat bed without using bedrails?: None Help needed moving to and from a bed to a chair (including a wheelchair)?: None Help needed standing up from a chair using your arms (e.g., wheelchair or bedside chair)?: None Help needed to walk in hospital room?: A Little Help needed climbing 3-5 steps with a railing? : A Little 6 Click Score: 22    End of Session Equipment Utilized During Treatment: Oxygen Activity Tolerance: Patient tolerated treatment well Patient left: in chair;with call bell/phone within reach;with family/visitor present   PT Visit Diagnosis: Difficulty in walking, not elsewhere classified (R26.2)    Time: 1610-9604 PT Time Calculation (min) (ACUTE ONLY): 25 min   Charges:   PT Evaluation $PT Eval Low Complexity: 1 Low PT Treatments $Gait Training: 8-22 mins           Doreatha Massed, PT Acute Rehabilitation  Office: [phone] Pager: [phone]

## 2021-09-02 NOTE — Progress Notes (Signed)
PROGRESS NOTE    Cynthia [name]  KLK:[id number] DOB: [birth year] DOA: 08/31/2021 PCP: Patient, No Pcp Per   Brief Narrative:  76 y.o. female with medical history significant of paroxysmal atrial fibrillation, incomplete RBBB, NSVT, pacemaker placement, mitral valve prolapse anxiety, arthritis, depression, GERD, right breast cancer, restless syndrome, type 2 diabetes, IBS, bladder cancer diagnosed in May 2023 currently undergoing chemotherapy (recently had second cycle on 08/07/2021) by Dr. Alen Blew presented with worsening shortness of breath.  On presentation, she was saturating 88% on room air.  COVID testing was negative.  Chest x-ray showed new right mid and lower lung infiltrate and haziness in the left base.  She was started on broad-spectrum antibiotics.  Assessment & Plan:   Multifocal pneumonia Acute hypoxic respiratory failure -Was saturating 88% on room air on presentation.  Currently on 3 L oxygen via nasal cannula.  Wean off as able.  COVID testing negative.  Imaging as above. -Continue current antibiotics. -Strep pneumo urinary antigen negative -Currently wheezing.  Add nebs.  Daughter hesitant to start steroids -Check respiratory virus panel -We will give 1 dose of IV Lasix because of slight puffiness of face and leg swelling.  Positive troponin -Possibly from demand ischemia.  No chest pain.  Troponins did not trend up.  No further work-up needed  Multifocal bladder tumor -Diagnosed in May 2023.  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  Currently follows up with Dr. Jennefer Bravo.  Received second cycle of chemotherapy on 08/07/2021.  Outpatient follow-up with oncology.  Will add oncology to care teams.  Anemia of chronic disease -From cancer and chemotherapy.  Hemoglobin stable.  Monitor intermittently  Macrocytosis -Questionable cause.  Outpatient follow-up  Leukopenia -Resolved.  Monitor intermittently  Thrombocytosis -Possibly reactive.  Monitor  intermittently  Hypertension -Monitor blood pressure.  Not on any medications at home.  Paroxysmal A-fib -Currently rate controlled.  Continue amiodarone and apixaban.  Outpatient follow-up with cardiology  Hypothyroidism -Continue levothyroxine  Hyperlipidemia Aortic atherosclerosis -Continue fenofibrate  Diabetes mellitus type 2 -Diet controlled.  Blood sugars currently stable  Physical deconditioning -PT eval   DVT prophylaxis: Eliquis Code Status: Full Family Communication: None at bedside Disposition Plan: Status is: Inpatient Remains inpatient appropriate because: Of severity of illness   Consultants: None  Procedures: None  Antimicrobials:  Anti-infectives (From admission, onward)    Start     Dose/Rate Route Frequency Ordered Stop   09/02/21 1400  vancomycin (VANCOREADY) IVPB 1500 mg/300 mL  Status:  Discontinued        1,500 mg 150 mL/hr over 120 Minutes Intravenous Every 48 hours 08/31/21 1629 09/01/21 1107   09/01/21 1215  doxycycline (VIBRA-TABS) tablet 100 mg        100 mg Oral Every 12 hours 09/01/21 1120     09/01/21 1200  azithromycin (ZITHROMAX) tablet 500 mg  Status:  Discontinued        500 mg Oral Daily 09/01/21 1107 09/01/21 1119   09/01/21 0200  ceFEPIme (MAXIPIME) 2 g in sodium chloride 0.9 % 100 mL IVPB        2 g 200 mL/hr over 30 Minutes Intravenous Every 12 hours 08/31/21 1528     08/31/21 1430  vancomycin (VANCOREADY) IVPB 1500 mg/300 mL        1,500 mg 150 mL/hr over 120 Minutes Intravenous  Once 08/31/21 1420 08/31/21 1725   08/31/21 1345  ceFEPIme (MAXIPIME) 2 g in sodium chloride 0.9 % 100 mL IVPB        2 g 200  mL/hr over 30 Minutes Intravenous  Once 08/31/21 1339 08/31/21 1502        Subjective: Patient seen and examined at bedside.  Still short of breath with intermittent cough.  Did not sleep well again last night.  No fever, vomiting, chest pain reported Objective: Vitals:   09/01/21 1810 09/01/21 2124 09/02/21 0500  09/02/21 0620  BP: (!) 104/54 134/65  (!) 125/57  Pulse: 78 67  91  Resp:  20  20  Temp:  99 F (37.2 C)  98.5 F (36.9 C)  TempSrc:  Oral    SpO2: 97% 99%  98%  Weight:   76 kg   Height:        Intake/Output Summary (Last 24 hours) at 09/02/2021 0739 Last data filed at 09/01/2021 1500 Gross per 24 hour  Intake 654.54 ml  Output 200 ml  Net 454.54 ml    Filed Weights   08/31/21 1316 09/02/21 0500  Weight: 73.4 kg 76 kg    Examination:  General: On 3 L oxygen via nasal cannula.  No distress.  Chronically ill looking and deconditioned.  Eyes look puffy ENT/neck: No thyromegaly.  JVD is not elevated  respiratory: Decreased breath sounds at bases bilaterally with some crackles; diffuse wheezing heard CVS: S1-S2 heard, rate controlled currently Abdominal: Soft, nontender, slightly distended; no organomegaly, bowel sounds are heard Extremities: Trace lower extremity edema; no cyanosis  CNS: Awake and alert.  Slow to respond.  No focal neurologic deficit.  Moves extremities Lymph: No obvious lymphadenopathy Skin: No obvious ecchymosis/lesions  psych: Mostly flat affect.  Not agitated musculoskeletal: No obvious joint swelling/deformity     Data Reviewed: I have personally reviewed following labs and imaging studies  CBC: Recent Labs  Lab 08/29/21 1125 08/31/21 1321 09/01/21 0535 09/02/21 0432  WBC 3.0* 4.1 6.5 4.6  NEUTROABS 1.2* 3.4  --  3.7  HGB 9.5* 10.2* 8.8* 8.1*  HCT 28.7* 32.1* 28.1* 26.1*  MCV 97.3 101.3* 102.9* 102.0*  PLT 444* 356 401* 444*    Basic Metabolic Panel: Recent Labs  Lab 08/29/21 1125 08/31/21 1321 09/01/21 0535 09/02/21 0432  NA 141 141 138 142  K 4.1 4.6 5.2* 4.2  CL 107 109 106 106  CO2 '30 26 25 29  '$ GLUCOSE 110* 88 82 100*  BUN 19 32* 25* 19  CREATININE 0.95 1.16* 1.21* 0.97  CALCIUM 9.2 9.0 8.7* 8.6*  MG  --  2.0  --  2.0    GFR: Estimated Creatinine Clearance: 48.7 mL/min (by C-G formula based on SCr of 0.97  mg/dL). Liver Function Tests: Recent Labs  Lab 08/29/21 1125 08/31/21 1321 09/01/21 0535 09/02/21 0432  AST 21 61* 57* 36  ALT 13 30 36 27  ALKPHOS 46 40 40 37*  BILITOT 0.3 1.0 1.4* 0.7  PROT 6.4* 6.8 6.4* 5.8*  ALBUMIN 3.8 3.6 3.3* 3.0*    No results for input(s): "LIPASE", "AMYLASE" in the last 168 hours. No results for input(s): "AMMONIA" in the last 168 hours. Coagulation Profile: Recent Labs  Lab 08/31/21 1321  INR 1.5*    Cardiac Enzymes: No results for input(s): "CKTOTAL", "CKMB", "CKMBINDEX", "TROPONINI" in the last 168 hours. BNP (last 3 results) No results for input(s): "PROBNP" in the last 8760 hours. HbA1C: No results for input(s): "HGBA1C" in the last 72 hours. CBG: Recent Labs  Lab 09/01/21 0734 09/01/21 1209 09/01/21 1634 09/01/21 2121 09/02/21 0726  GLUCAP 79 88 87 96 85    Lipid Profile: No results for input(s): "  CHOL", "HDL", "LDLCALC", "TRIG", "CHOLHDL", "LDLDIRECT" in the last 72 hours. Thyroid Function Tests: Recent Labs    09/01/21 0535  TSH 2.289    Anemia Panel: No results for input(s): "VITAMINB12", "FOLATE", "FERRITIN", "TIBC", "IRON", "RETICCTPCT" in the last 72 hours. Sepsis Labs: Recent Labs  Lab 08/31/21 1254 08/31/21 1621 08/31/21 2105  LATICACIDVEN 0.8 2.1* 1.9     Recent Results (from the past 240 hour(s))  Blood Culture (routine x 2)     Status: None (Preliminary result)   Collection Time: 08/31/21  1:00 PM   Specimen: BLOOD  Result Value Ref Range Status   Specimen Description   Final    BLOOD SITE NOT SPECIFIED Performed at [facility] [address] [place], [place] [address]    Special Requests   Final    BOTTLES DRAWN AEROBIC AND ANAEROBIC Blood Culture adequate volume Performed at [facility] [address] [place], [place] [address]    Culture   Final    NO GROWTH < 24 HOURS Performed at [facility] [address] [place], [place] [address]     Report Status PENDING  Incomplete  Blood Culture (routine x 2)     Status: None (Preliminary result)   Collection Time: 08/31/21  1:06 PM   Specimen: BLOOD  Result Value Ref Range Status   Specimen Description   Final    BLOOD SITE NOT SPECIFIED Performed at [facility] [address], [place], [place] [address]    Special Requests   Final    BOTTLES DRAWN AEROBIC AND ANAEROBIC Blood Culture results may not be optimal due to an inadequate volume of blood received in culture bottles Performed at [facility] [address], [place], [place] [address]    Culture   Final    NO GROWTH < 24 HOURS Performed at [facility] [address], [place], [place] [address]    Report Status PENDING  Incomplete  SARS Coronavirus 2 by RT PCR (hospital order, performed in [facility] hospital lab) *cepheid single result test* Anterior Nasal Swab     Status: None   Collection Time: 08/31/21  1:22 PM   Specimen: Anterior Nasal Swab  Result Value Ref Range Status   SARS Coronavirus 2 by RT PCR NEGATIVE NEGATIVE Final    Comment: (NOTE) SARS-CoV-2 target nucleic acids are NOT DETECTED.  The SARS-CoV-2 RNA is generally detectable in upper and lower respiratory specimens during the acute phase of infection. The lowest concentration of SARS-CoV-2 viral copies this assay can detect is 250 copies / mL. A negative result does not preclude SARS-CoV-2 infection and should not be used as the sole basis for treatment or other patient management decisions.  A negative result may occur with improper specimen collection / handling, submission of specimen other than nasopharyngeal swab, presence of viral mutation(s) within the areas targeted by this assay, and inadequate number of viral copies (<250 copies / mL). A negative result must be combined with clinical observations, patient history, and epidemiological information.  Fact Sheet for Patients:    https://www.patel.info/  Fact Sheet for Healthcare Providers: https://hall.com/  This test is not yet approved or  cleared by the [place] FDA and has been authorized for detection and/or diagnosis of SARS-CoV-2 by FDA under an Emergency Use Authorization (EUA).  This EUA will remain in effect (meaning this test can be used) for the duration of the COVID-19 declaration under Section 564(b)(1) of  the Act, 21 U.S.C. section 360bbb-3(b)(1), unless the authorization is terminated or revoked sooner.  Performed at [facility], [place] [address] [place], [place] [address]   Urine Culture     Status: None   Collection Time: 08/31/21  2:57 PM   Specimen: In/Out Cath Urine  Result Value Ref Range Status   Specimen Description   Final    IN/OUT CATH URINE Performed at [facility] [address], [place], [place] [address]    Special Requests   Final    NONE Performed at [facility], [place] [address] [place], [place] [address]    Culture   Final    NO GROWTH Performed at [facility], [place] [address], [place], [place] [address]    Report Status 09/01/2021 FINAL  Final  Expectorated Sputum Assessment w Gram Stain, Rflx to Resp Cult     Status: None   Collection Time: 08/31/21  9:33 PM   Specimen: Sputum  Result Value Ref Range Status   Specimen Description SPU  Final   Special Requests NONE  Final   Sputum evaluation   Final    Sputum specimen not acceptable for testing.  Please recollect.   NOTIFIED RN [facility] AT 2150 08/31/21 CRUICKSHANK A Performed at [facility], [place] [address], [place], [place] [address]    Report Status 08/31/2021 FINAL  Final  MRSA Next Gen by PCR, Nasal     Status: None   Collection Time: 09/01/21  2:02 AM   Specimen: Nasal Mucosa; Nasal Swab  Result Value Ref Range Status   MRSA by PCR Next Gen NOT DETECTED NOT DETECTED Final     Comment: (NOTE) The GeneXpert MRSA Assay (FDA approved for NASAL specimens only), is one component of a comprehensive MRSA colonization surveillance program. It is not intended to diagnose MRSA infection nor to guide or monitor treatment for MRSA infections. Test performance is not FDA approved in patients less than 10 years old. Performed at [facility], [place] [address] [place], [place] [address]   Expectorated Sputum Assessment w Gram Stain, Rflx to Resp Cult     Status: None   Collection Time: 09/01/21  5:58 AM  Result Value Ref Range Status   Specimen Description EXPECTORATED SPUTUM  Final   Special Requests NONE  Final   Sputum evaluation   Final    THIS SPECIMEN IS ACCEPTABLE FOR SPUTUM CULTURE Performed at [facility], [place] [address] [place], [place] [address]    Report Status 09/01/2021 FINAL  Final  Culture, Respiratory w Gram Stain     Status: None (Preliminary result)   Collection Time: 09/01/21  5:58 AM  Result Value Ref Range Status   Specimen Description   Final    EXPECTORATED SPUTUM Performed at [facility] [address], [place], [place] [address]    Special Requests   Final    NONE Reflexed from [id number] Performed at [facility], [place] [address], [place], [place] [address]    Gram Stain   Final    MODERATE GRAM POSITIVE RODS RARE GRAM NEGATIVE RODS ABUNDANT WBC PRESENT, PREDOMINANTLY PMN RARE SQUAMOUS EPITHELIAL CELLS PRESENT Performed at [facility], [place] [address] [place],  [address]    Culture PENDING  Incomplete   Report Status PENDING  Incomplete         Radiology Studies: DG Chest Port 1 View  Result Date: 08/31/2021  CLINICAL DATA:  Questionable sepsis. EXAM: PORTABLE CHEST 1 VIEW COMPARISON:  August 19, 2021 FINDINGS: New infiltrate in the right mid and lower lung. Mild haziness in the left base. Stable cardiomegaly. The hila and mediastinum are  unremarkable. The right Port-A-Cath is stable. No pneumothorax. No other acute abnormalities. IMPRESSION: New right mid and lower lung infiltrate. Mild haziness in the left base is nonspecific but early opacity not excluded. The findings are concerning for multifocal pneumonia given history. Electronically Signed   By: Dorise Bullion III M.D.   On: 08/31/2021 14:00        Scheduled Meds:  ALPRAZolam  1.5 mg Oral QHS   amiodarone  100 mg Oral Daily   anastrozole  1 mg Oral Daily   apixaban  5 mg Oral BID   docusate sodium  200 mg Oral QODAY   doxycycline  100 mg Oral Q12H   fenofibrate  160 mg Oral Daily   ferrous sulfate  325 mg Oral QHS   levothyroxine  112 mcg Oral QAC breakfast   mirtazapine  15 mg Oral QHS   pantoprazole  40 mg Oral BID   psyllium  1 packet Oral QODAY   Continuous Infusions:  ceFEPime (MAXIPIME) IV 2 g (09/01/21 2327)          Aline August, MD [facility] 09/02/2021, 7:39 AM

## 2021-09-03 DIAGNOSIS — J9601 Acute respiratory failure with hypoxia: Secondary | ICD-10-CM

## 2021-09-03 DIAGNOSIS — R778 Other specified abnormalities of plasma proteins: Secondary | ICD-10-CM

## 2021-09-03 DIAGNOSIS — D638 Anemia in other chronic diseases classified elsewhere: Secondary | ICD-10-CM

## 2021-09-03 DIAGNOSIS — J189 Pneumonia, unspecified organism: Secondary | ICD-10-CM | POA: Diagnosis not present

## 2021-09-03 LAB — GLUCOSE, CAPILLARY: Glucose-Capillary: 72 mg/dL (ref 70–99)

## 2021-09-03 LAB — CULTURE, RESPIRATORY W GRAM STAIN: Culture: NORMAL

## 2021-09-03 MED ORDER — ORAL CARE MOUTH RINSE
15.0000 mL | OROMUCOSAL | Status: DC | PRN
Start: 2021-09-03 — End: 2021-09-03

## 2021-09-03 MED ORDER — TRAZODONE HCL 50 MG PO TABS: 50.0000 mg | ORAL_TABLET | Freq: Every day | ORAL | 0 refills | Status: DC

## 2021-09-03 MED ORDER — FLUCONAZOLE 150 MG PO TABS
150.0000 mg | ORAL_TABLET | Freq: Once | ORAL | Status: AC
  Administered 2021-09-03: 150 mg via ORAL
  Filled 2021-09-03: qty 1

## 2021-09-03 MED ORDER — CEFUROXIME AXETIL 500 MG PO TABS: 500.0000 mg | ORAL_TABLET | Freq: Two times a day (BID) | ORAL | 0 refills | Status: AC

## 2021-09-03 MED ORDER — ALBUTEROL SULFATE HFA 108 (90 BASE) MCG/ACT IN AERS: 2.0000 | INHALATION_SPRAY | RESPIRATORY_TRACT | 0 refills | Status: DC | PRN

## 2021-09-03 NOTE — Progress Notes (Signed)
AVS and discharge instructions reviewed w/ patient and daughter at the bedside. Patient and daughter verbalized understanding and had no further questions.

## 2021-09-03 NOTE — Discharge Summary (Addendum)
Physician Discharge Summary  Cynthia [name] ZDG:[id number] DOB: [birth year] DOA: 08/31/2021  PCP: Saintclair Halsted, FNP  Admit date: 08/31/2021 Discharge date: 09/03/2021  Admitted From: Home Disposition: Home  Recommendations for Outpatient Follow-up:  Follow up with PCP in 1 week with repeat CBC/BMP Outpatient follow-up with oncology Follow up in ED if symptoms worsen or new appear   Home Health: No Equipment/Devices: None  Discharge Condition: Stable CODE STATUS: Full Diet recommendation: Heart healthy  Brief/Interim Summary: 76 y.o. female with medical history significant of paroxysmal atrial fibrillation, incomplete RBBB, NSVT, pacemaker placement, mitral valve prolapse anxiety, arthritis, depression, GERD, right breast cancer, restless syndrome, type 2 diabetes, IBS, bladder cancer diagnosed in May 2023 currently undergoing chemotherapy (recently had second cycle on 08/07/2021) by Dr. Alen Blew presented with worsening shortness of breath.  On presentation, she was saturating 88% on room air.  COVID testing was negative.  Chest x-ray showed new right mid and lower lung infiltrate and haziness in the left base.  She was started on broad-spectrum antibiotics.  During the hospitalization, her respiratory status has improved.  She is currently on room air.  She feels okay to go home today.  She will be discharged home today on oral antibiotics.    Discharge Diagnoses:   Multifocal pneumonia Acute hypoxic respiratory failure -Was saturating 88% on room air on presentation.  Required 2 3 L oxygen via nasal cannula.  Wean off as able.  COVID testing negative.  Imaging as above. -Currently on cefepime and doxycycline. -Strep pneumo urinary antigen negative -respiratory virus panel negative -Status post 1 dose of IV Lasix on 08/25/2021 for some leg swelling and facial puffiness: Improved  -Respiratory status has much improved.  Currently on room air.  Wants to go home today.  Discharge home on  oral Ceftin for 5 more days.  Outpatient follow-up with PCP.  positive troponin -Possibly from demand ischemia.  No chest pain.  Troponins did not trend up.  No further work-up needed.  Outpatient follow-up with cardiology   Multifocal bladder tumor -Diagnosed in May 2023.  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  Currently follows up with Dr. Jennefer Bravo.  Received second cycle of chemotherapy on 08/07/2021.  Outpatient follow-up with oncology.     Anemia of chronic disease -From cancer and chemotherapy.  Hemoglobin stable.  Outpatient follow-up  Macrocytosis -Questionable cause.  Outpatient follow-up   Leukopenia -Resolved.     Thrombocytosis -Possibly reactive.  Outpatient follow-up  Hypertension -Monitor blood pressure.  Not on any medications at home.  Outpatient follow-up   Paroxysmal A-fib -Currently rate controlled.  Continue amiodarone and apixaban.  Outpatient follow-up with cardiology   Hypothyroidism -Continue levothyroxine   Hyperlipidemia Aortic atherosclerosis -Continue fenofibrate   Diabetes mellitus type 2 -Diet controlled.  Carb modified diet.  Outpatient follow-up  Physical deconditioning -PT recommended no PT follow-up  Insomnia -Patient has had chronic insomnia for quite some time and is on Xanax 1.5 mg at bedtime along with mirtazapine 15 mg at bedtime.  She received a dose of trazodone 50 mg on 09/02/2021 and had very good night sleep.  She is requesting that she be sent home on trazodone.  I will discontinue mirtazapine and discharge her home on trazodone 50 mg daily for seizure PCP.  She was hesitant to talk about tapering her bedtime Xanax.  This will be done as an outpatient by PCP if needed.  Obesity -Outpatient follow-up   Discharge Instructions  Discharge Instructions     Diet - low sodium  heart healthy   Complete by: As directed    Increase activity slowly   Complete by: As directed       Allergies as of  09/03/2021   No Known Allergies      Medication List     STOP taking these medications    HYDROcodone-acetaminophen 5-325 MG tablet Commonly known as: NORCO/VICODIN   lidocaine 5 % Commonly known as: Lidoderm   mirtazapine 15 MG tablet Commonly known as: REMERON   senna-docusate 8.6-50 MG tablet Commonly known as: Senokot-S       TAKE these medications    acetaminophen 500 MG tablet Commonly known as: TYLENOL Take 500-1,000 mg by mouth See admin instructions. Take 1,000 mg by mouth at bedtime and an additional 500-1,000 mg once a day as needed for mild pain or headaches   albuterol 108 (90 Base) MCG/ACT inhaler Commonly known as: VENTOLIN HFA Inhale 2 puffs into the lungs every 4 (four) hours as needed for wheezing or shortness of breath.   ALPRAZolam 1 MG tablet Commonly known as: XANAX Take 0.5-1.5 mg by mouth See admin instructions. Take 1.5 mg by mouth at bedtime and an additional 0.5 mg once a day as needed for anxiety   amiodarone 100 MG tablet Commonly known as: Pacerone Take 1 tablet (100 mg total) by mouth daily.   anastrozole 1 MG tablet Commonly known as: ARIMIDEX TAKE 1 TABLET BY MOUTH EVERY DAY What changed: when to take this   cefUROXime 500 MG tablet Commonly known as: CEFTIN Take 1 tablet (500 mg total) by mouth 2 (two) times daily for 5 days.   docusate sodium 100 MG capsule Commonly known as: COLACE Take 200 mg by mouth See admin instructions. Take 200 mg by mouth at bedtime, every other night- when not taking Metamucil capsules   Eliquis 5 MG Tabs tablet Generic drug: apixaban TAKE 1 TABLET BY MOUTH TWICE A DAY What changed: how much to take   Fenofibric Acid 135 MG Cpdr Take 135 mg by mouth daily.   ferrous sulfate 325 (65 FE) MG tablet Take 325 mg by mouth at bedtime.   levothyroxine 112 MCG tablet Commonly known as: SYNTHROID Take 112 mcg by mouth at bedtime.   lidocaine-prilocaine cream Commonly known as: EMLA Apply 1  Application topically as needed. What changed: reasons to take this   Metamucil 0.52 g capsule Generic drug: psyllium Take 3.64-4.16 g by mouth See admin instructions. Take 3.65-4.16 grams (7-8 capsules) by mouth at bedtime every other night, when not taking colace capsules   pantoprazole 40 MG tablet Commonly known as: PROTONIX Take 40 mg by mouth See admin instructions. Take 40 mg by mouth 30 minutes before breakfast and at bedtime   prochlorperazine 10 MG tablet Commonly known as: COMPAZINE Take 1 tablet (10 mg total) by mouth every 6 (six) hours as needed for nausea or vomiting.   traZODone 50 MG tablet Commonly known as: DESYREL Take 1 tablet (50 mg total) by mouth at bedtime.   Vitamin D-3 25 MCG (1000 UT) Caps Take 1,000-2,000 Units by mouth daily with breakfast.        Follow-up Information     Saintclair Halsted, FNP. Schedule an appointment as soon as possible for a visit in 1 week(s).   Specialty: Family Medicine Contact information: [place] [address] [phone]         Elouise Munroe, MD .   Specialties: Cardiology, Radiology Contact information: [address]  [place] [address] [phone]         Wyatt Portela, MD. Schedule an appointment as soon as possible for a visit in 1 week(s).   Specialty: Oncology Contact information: [place] [address] [phone]                No Known Allergies  Consultations: None   Procedures/Studies: DG Chest Port 1 View  Result Date: 08/31/2021 CLINICAL DATA:  Questionable sepsis. EXAM: PORTABLE CHEST 1 VIEW COMPARISON:  August 19, 2021 FINDINGS: New infiltrate in the right mid and lower lung. Mild haziness in the left base. Stable cardiomegaly. The hila and mediastinum are unremarkable. The right Port-A-Cath is stable. No pneumothorax. No other acute abnormalities. IMPRESSION: New right mid and lower lung infiltrate. Mild  haziness in the left base is nonspecific but early opacity not excluded. The findings are concerning for multifocal pneumonia given history. Electronically Signed   By: Dorise Bullion III M.D.   On: 08/31/2021 14:00   DG Chest 2 View  Result Date: 08/19/2021 CLINICAL DATA:  Fever workup in cancer patient EXAM: CHEST - 2 VIEW COMPARISON:  Radiographs 04/18/2021 FINDINGS: No focal consolidation, pleural effusion, or pneumothorax. Stable mild cardiomegaly. No acute osseous abnormality. Aortic calcification. Right chest wall Port-A-Cath tip in the low SVC. Loop recorder. Surgical clips right breast and axilla. IMPRESSION: No active cardiopulmonary disease.  Stable mild cardiomegaly. Electronically Signed   By: Placido Sou M.D.   On: 08/19/2021 21:12   CUP PACEART REMOTE DEVICE CHECK  Result Date: 08/09/2021 ILR summary report received. Battery status OK. Normal device function. No new symptom, tachy, brady, or pause episodes. Monthly summary reports and ROV/PRN AF episodes/compass up to 6hrs in duration, burden 7.3%, Eliquis, Amiodarone.  No EGM's for review. LA     Subjective: Patient seen and examined at bedside.  Slept better last night.  Feels better.  Breathing better.  Feels okay to go home today.  No overnight fever, chest pain, worsening shortness of breath reported.  Discharge Exam: Vitals:   09/03/21 0950 09/03/21 1015  BP:    Pulse: 84   Resp: 18   Temp:    SpO2: 94% 94%    General: Pt is alert, awake, not in acute distress.  Elderly female lying in bed. Cardiovascular: rate controlled, S1/S2 + Respiratory: bilateral decreased breath sounds at bases with some scattered crackles Abdominal: Soft, NT, ND, bowel sounds + Extremities: Trace lower extremity edema; no cyanosis    The results of significant diagnostics from this hospitalization (including imaging, microbiology, ancillary and laboratory) are listed below for reference.     Microbiology: Recent Results (from the  past 240 hour(s))  Blood Culture (routine x 2)     Status: None (Preliminary result)   Collection Time: 08/31/21  1:00 PM   Specimen: BLOOD  Result Value Ref Range Status   Specimen Description   Final    BLOOD SITE NOT SPECIFIED Performed at [facility] [address] [place], [place] [address]    Special Requests   Final    BOTTLES DRAWN AEROBIC AND ANAEROBIC Blood Culture adequate volume Performed at [facility] [address] [place], [place] [address]    Culture   Final    NO GROWTH 3 DAYS Performed at [facility], [place] [address] [place], [place] [address]    Report Status PENDING  Incomplete  Blood Culture (routine x 2)     Status: None (Preliminary  result)   Collection Time: 08/31/21  1:06 PM   Specimen: BLOOD  Result Value Ref Range Status   Specimen Description   Final    BLOOD SITE NOT SPECIFIED Performed at [facility] [address], [place], [place] [address]    Special Requests   Final    BOTTLES DRAWN AEROBIC AND ANAEROBIC Blood Culture results may not be optimal due to an inadequate volume of blood received in culture bottles Performed at [facility] [address], [place], [place] [address]    Culture   Final    NO GROWTH 3 DAYS Performed at [facility], [place] [address], [place], [place] [address]    Report Status PENDING  Incomplete  SARS Coronavirus 2 by RT PCR (hospital order, performed in [facility] hospital lab) *cepheid single result test* Anterior Nasal Swab     Status: None   Collection Time: 08/31/21  1:22 PM   Specimen: Anterior Nasal Swab  Result Value Ref Range Status   SARS Coronavirus 2 by RT PCR NEGATIVE NEGATIVE Final    Comment: (NOTE) SARS-CoV-2 target nucleic acids are NOT DETECTED.  The SARS-CoV-2 RNA is generally detectable in upper and lower respiratory specimens during the acute phase of infection. The lowest concentration of  SARS-CoV-2 viral copies this assay can detect is 250 copies / mL. A negative result does not preclude SARS-CoV-2 infection and should not be used as the sole basis for treatment or other patient management decisions.  A negative result may occur with improper specimen collection / handling, submission of specimen other than nasopharyngeal swab, presence of viral mutation(s) within the areas targeted by this assay, and inadequate number of viral copies (<250 copies / mL). A negative result must be combined with clinical observations, patient history, and epidemiological information.  Fact Sheet for Patients:   https://www.patel.info/  Fact Sheet for Healthcare Providers: https://hall.com/  This test is not yet approved or  cleared by the [place] FDA and has been authorized for detection and/or diagnosis of SARS-CoV-2 by FDA under an Emergency Use Authorization (EUA).  This EUA will remain in effect (meaning this test can be used) for the duration of the COVID-19 declaration under Section 564(b)(1) of the Act, 21 U.S.C. section 360bbb-3(b)(1), unless the authorization is terminated or revoked sooner.  Performed at [facility], [place] [address], [place], [place] [address]   Urine Culture     Status: None   Collection Time: 08/31/21  2:57 PM   Specimen: In/Out Cath Urine  Result Value Ref Range Status   Specimen Description   Final    IN/OUT CATH URINE Performed at [facility] [address] [place], [place] [address]    Special Requests   Final    NONE Performed at [facility], [place] [address] [place], [place] [address]    Culture   Final    NO GROWTH Performed at [facility], [place] [address], [place], [place] [address]    Report Status 09/01/2021 FINAL  Final  Expectorated Sputum Assessment w Gram Stain, Rflx to Resp Cult     Status: None   Collection Time:  08/31/21  9:33 PM   Specimen: Sputum  Result Value Ref Range Status   Specimen Description SPU  Final   Special Requests NONE  Final   Sputum evaluation   Final    Sputum specimen not acceptable for testing.  Please recollect.   NOTIFIED  RN [facility] AT 2150 08/31/21 CRU[place]SHANK A Performed at [facility], [place] [address] [place], [place] [address]    Report Status 08/31/2021 FINAL  Final  MRSA Next Gen by PCR, Nasal     Status: None   Collection Time: 09/01/21  2:02 AM   Specimen: Nasal Mucosa; Nasal Swab  Result Value Ref Range Status   MRSA by PCR Next Gen NOT DETECTED NOT DETECTED Final    Comment: (NOTE) The GeneXpert MRSA Assay (FDA approved for NASAL specimens only), is one component of a comprehensive MRSA colonization surveillance program. It is not intended to diagnose MRSA infection nor to guide or monitor treatment for MRSA infections. Test performance is not FDA approved in patients less than 110 years old. Performed at [facility], [place] [address], [place], [place] [address]   Expectorated Sputum Assessment w Gram Stain, Rflx to Resp Cult     Status: None   Collection Time: 09/01/21  5:58 AM  Result Value Ref Range Status   Specimen Description EXPECTORATED SPUTUM  Final   Special Requests NONE  Final   Sputum evaluation   Final    THIS SPECIMEN IS ACCEPTABLE FOR SPUTUM CULTURE Performed at [facility], [place] [address], [place], [place] [address]    Report Status 09/01/2021 FINAL  Final  Culture, Respiratory w Gram Stain     Status: None (Preliminary result)   Collection Time: 09/01/21  5:58 AM  Result Value Ref Range Status   Specimen Description   Final    EXPECTORATED SPUTUM Performed at [facility] [address], [place], [place] [address]    Special Requests   Final    NONE Reflexed from [id number] Performed at [facility], [place] [address], [place], [place]  [address]    Gram Stain   Final    MODERATE GRAM POSITIVE RODS RARE GRAM NEGATIVE RODS ABUNDANT WBC PRESENT, PREDOMINANTLY PMN RARE SQUAMOUS EPITHELIAL CELLS PRESENT    Culture   Final    CULTURE REINCUBATED FOR BETTER GROWTH Performed at [facility], [place] [address], [place],  [address]    Report Status PENDING  Incomplete  Respiratory (~20 pathogens) panel by PCR     Status: None   Collection Time: 09/02/21  6:24 PM  Result Value Ref Range Status   Adenovirus NOT DETECTED NOT DETECTED Final   Coronavirus 229E NOT DETECTED NOT DETECTED Final    Comment: (NOTE) The Coronavirus on the Respiratory Panel, DOES NOT test for the novel  Coronavirus (2019 nCoV)    Coronavirus HKU1 NOT DETECTED NOT DETECTED Final   Coronavirus NL63 NOT DETECTED NOT DETECTED Final   Coronavirus OC43 NOT DETECTED NOT DETECTED Final   Metapneumovirus NOT DETECTED NOT DETECTED Final   Rhinovirus / Enterovirus NOT DETECTED NOT DETECTED Final   Influenza A NOT DETECTED NOT DETECTED Final   Influenza B NOT DETECTED NOT DETECTED Final   Parainfluenza Virus 1 NOT DETECTED NOT DETECTED Final   Parainfluenza Virus 2 NOT DETECTED NOT DETECTED Final   Parainfluenza Virus 3 NOT DETECTED NOT DETECTED Final   Parainfluenza Virus 4 NOT DETECTED NOT DETECTED Final   Respiratory Syncytial Virus NOT DETECTED NOT DETECTED Final   Bordetella pertussis NOT DETECTED NOT DETECTED Final   Bordetella Parapertussis NOT DETECTED NOT DETECTED Final   Chlamydophila pneumoniae NOT DETECTED NOT DETECTED Final   Mycoplasma pneumoniae NOT DETECTED NOT DETECTED Final    Comment: Performed at [facility], [address], [place], [place] [address]     Labs: BNP (last 3 results) No results for input(s): "BNP" in the last 8760 hours. Basic Metabolic Panel: Recent Labs  Lab 08/29/21 1125 08/31/21 1321 09/01/21 0535 09/02/21 0432  NA 141 141 138 142  K 4.1 4.6 5.2* 4.2  CL 107 109 106 106  CO2 '30 26 25 29   '$ GLUCOSE 110* 88 82 100*  BUN 19 32* 25* 19  CREATININE 0.95 1.16* 1.21* 0.97  CALCIUM 9.2 9.0 8.7* 8.6*  MG  --  2.0  --  2.0   Liver Function Tests: Recent Labs  Lab 08/29/21 1125 08/31/21 1321 09/01/21 0535 09/02/21 0432  AST 21 61* 57* 36  ALT 13 30 36 27  ALKPHOS 46 40 40 37*  BILITOT 0.3 1.0 1.4* 0.7  PROT 6.4* 6.8 6.4* 5.8*  ALBUMIN 3.8 3.6 3.3* 3.0*   No results for input(s): "LIPASE", "AMYLASE" in the last 168 hours. No results for input(s): "AMMONIA" in the last 168 hours. CBC: Recent Labs  Lab 08/29/21 1125 08/31/21 1321 09/01/21 0535 09/02/21 0432  WBC 3.0* 4.1 6.5 4.6  NEUTROABS 1.2* 3.4  --  3.7  HGB 9.5* 10.2* 8.8* 8.1*  HCT 28.7* 32.1* 28.1* 26.1*  MCV 97.3 101.3* 102.9* 102.0*  PLT 444* 356 401* 444*   Cardiac Enzymes: No results for input(s): "CKTOTAL", "CKMB", "CKMBINDEX", "TROPONINI" in the last 168 hours. BNP: Invalid input(s): "POCBNP" CBG: Recent Labs  Lab 09/02/21 0726 09/02/21 1211 09/02/21 1720 09/02/21 2056 09/03/21 0804  GLUCAP 85 117* 128* 161* 72   D-Dimer No results for input(s): "DDIMER" in the last 72 hours. Hgb A1c No results for input(s): "HGBA1C" in the last 72 hours. Lipid Profile No results for input(s): "CHOL", "HDL", "LDLCALC", "TRIG", "CHOLHDL", "LDLDIRECT" in the last 72 hours. Thyroid function studies Recent Labs    09/01/21 0535  TSH 2.289   Anemia work up No results for input(s): "VITAMINB12", "FOLATE", "FERRITIN", "TIBC", "IRON", "RETICCTPCT" in the last 72 hours. Urinalysis    Component Value Date/Time   COLORURINE YELLOW 08/31/2021 1321   APPEARANCEUR CLEAR 08/31/2021 1321   LABSPEC 1.018 08/31/2021 1321   PHURINE 5.0 08/31/2021 1321   GLUCOSEU NEGATIVE 08/31/2021 1321   HGBUR NEGATIVE 08/31/2021 1321   BILIRUBINUR NEGATIVE 08/31/2021 1321   KETONESUR NEGATIVE 08/31/2021 1321   PROTEINUR NEGATIVE 08/31/2021 1321   NITRITE NEGATIVE 08/31/2021 1321   LEUKOCYTESUR SMALL (A) 08/31/2021 1321    Sepsis Labs Recent Labs  Lab 08/29/21 1125 08/31/21 1321 09/01/21 0535 09/02/21 0432  WBC 3.0* 4.1 6.5 4.6   Microbiology Recent Results (from the past 240 hour(s))  Blood Culture (routine x 2)     Status: None (Preliminary result)   Collection Time: 08/31/21  1:00 PM   Specimen: BLOOD  Result Value Ref Range Status   Specimen Description   Final    BLOOD SITE NOT SPECIFIED Performed at [facility] [address], [place], [place] [address]    Special Requests   Final    BOTTLES DRAWN AEROBIC AND ANAEROBIC Blood Culture adequate volume Performed at [facility] [address] [place], [place] [address]    Culture   Final    NO GROWTH 3 DAYS Performed at [facility], [place] [address], [place], [place] [address]    Report Status PENDING  Incomplete  Blood Culture (routine x 2)     Status: None (Preliminary result)   Collection Time: 08/31/21  1:06 PM   Specimen: BLOOD  Result Value Ref Range Status   Specimen Description   Final    BLOOD SITE NOT SPECIFIED Performed at [facility] [address], [place], [place] [address]    Special Requests   Final    BOTTLES DRAWN AEROBIC AND ANAEROBIC Blood Culture results may not be optimal due to an inadequate volume of blood received in culture bottles Performed at [facility] [address], [place], [place] [address]    Culture   Final    NO GROWTH 3 DAYS Performed at [facility], [place] [address] [place], [place] [address]    Report Status PENDING  Incomplete  SARS Coronavirus 2 by RT PCR (hospital order, performed in [facility] hospital lab) *cepheid single result test* Anterior Nasal Swab     Status: None   Collection Time: 08/31/21  1:22 PM   Specimen: Anterior Nasal Swab  Result Value Ref Range Status   SARS Coronavirus 2 by RT PCR NEGATIVE NEGATIVE Final    Comment: (NOTE) SARS-CoV-2 target nucleic acids are NOT  DETECTED.  The SARS-CoV-2 RNA is generally detectable in upper and lower respiratory specimens during the acute phase of infection. The lowest concentration of SARS-CoV-2 viral copies this assay can detect is 250 copies / mL. A negative result does not preclude SARS-CoV-2 infection and should not be used as the sole basis for treatment or other patient management decisions.  A negative result may occur with improper specimen collection / handling, submission of specimen other than nasopharyngeal swab, presence of viral mutation(s) within the areas targeted by this assay, and inadequate number of viral copies (<250 copies / mL). A negative result must be combined with clinical observations, patient history, and epidemiological information.  Fact Sheet for Patients:   https://www.patel.info/  Fact Sheet for Healthcare Providers: https://hall.com/  This test is not yet approved or  cleared by the [place] FDA and has been authorized for detection and/or diagnosis of SARS-CoV-2 by FDA under an Emergency Use Authorization (EUA).  This EUA will remain in effect (meaning this test can be used) for the duration of the COVID-19 declaration under Section 564(b)(1) of the Act, 21 U.S.C. section 360bbb-3(b)(1), unless the authorization is terminated or revoked sooner.  Performed at [facility], [place] [address] [place], [place] [address]   Urine Culture     Status: None   Collection Time: 08/31/21  2:57 PM   Specimen: In/Out Cath Urine  Result Value Ref Range Status   Specimen Description   Final    IN/OUT CATH URINE Performed at [facility] [address] [place], [place] [address]    Special Requests   Final    NONE Performed at [facility], [place] [address], [place], [place] [address]    Culture   Final    NO GROWTH Performed at [facility], [place] [address],  [place],  [address]    Report Status 09/01/2021 FINAL  Final  Expectorated Sputum Assessment w Gram Stain, Rflx to Resp Cult     Status: None   Collection Time: 08/31/21  9:33 PM   Specimen: Sputum  Result Value Ref Range Status   Specimen Description SPU  Final   Special Requests NONE  Final   Sputum evaluation   Final    Sputum specimen not acceptable for testing.  Please recollect.   NOTIFIED RN [facility] AT 2150 08/31/21 [place] A  Performed at [facility], [place] [address] [place], [place] [address]    Report Status 08/31/2021 FINAL  Final  MRSA Next Gen by PCR, Nasal     Status: None   Collection Time: 09/01/21  2:02 AM   Specimen: Nasal Mucosa; Nasal Swab  Result Value Ref Range Status   MRSA by PCR Next Gen NOT DETECTED NOT DETECTED Final    Comment: (NOTE) The GeneXpert MRSA Assay (FDA approved for NASAL specimens only), is one component of a comprehensive MRSA colonization surveillance program. It is not intended to diagnose MRSA infection nor to guide or monitor treatment for MRSA infections. Test performance is not FDA approved in patients less than 71 years old. Performed at [facility], [place] [address], [place], [place] [address]   Expectorated Sputum Assessment w Gram Stain, Rflx to Resp Cult     Status: None   Collection Time: 09/01/21  5:58 AM  Result Value Ref Range Status   Specimen Description EXPECTORATED SPUTUM  Final   Special Requests NONE  Final   Sputum evaluation   Final    THIS SPECIMEN IS ACCEPTABLE FOR SPUTUM CULTURE Performed at [facility], [place] [address], [place], [place] [address]    Report Status 09/01/2021 FINAL  Final  Culture, Respiratory w Gram Stain     Status: None (Preliminary result)   Collection Time: 09/01/21  5:58 AM  Result Value Ref Range Status   Specimen Description   Final    EXPECTORATED SPUTUM Performed at [place] [address],  [place], [place] [address]    Special Requests   Final    NONE Reflexed from [id number] Performed at [facility], [place] [address] [place], [place] [address]    Gram Stain   Final    MODERATE GRAM POSITIVE RODS RARE GRAM NEGATIVE RODS ABUNDANT WBC PRESENT, PREDOMINANTLY PMN RARE SQUAMOUS EPITHELIAL CELLS PRESENT    Culture   Final    CULTURE REINCUBATED FOR BETTER GROWTH Performed at [facility], [place] [address] [place], [place] [address]    Report Status PENDING  Incomplete  Respiratory (~20 pathogens) panel by PCR     Status: None   Collection Time: 09/02/21  6:24 PM  Result Value Ref Range Status   Adenovirus NOT DETECTED NOT DETECTED Final   Coronavirus 229E NOT DETECTED NOT DETECTED Final    Comment: (NOTE) The Coronavirus on the Respiratory Panel, DOES NOT test for the novel  Coronavirus (2019 nCoV)    Coronavirus HKU1 NOT DETECTED NOT DETECTED Final   Coronavirus NL63 NOT DETECTED NOT DETECTED Final   Coronavirus OC43 NOT DETECTED NOT DETECTED Final   Metapneumovirus NOT DETECTED NOT DETECTED Final   Rhinovirus / Enterovirus NOT DETECTED NOT DETECTED Final   Influenza A NOT DETECTED NOT DETECTED Final   Influenza B NOT DETECTED NOT DETECTED Final   Parainfluenza Virus 1 NOT DETECTED NOT DETECTED Final   Parainfluenza Virus 2 NOT DETECTED NOT DETECTED Final   Parainfluenza Virus 3 NOT DETECTED NOT DETECTED Final   Parainfluenza Virus 4 NOT DETECTED NOT DETECTED Final   Respiratory Syncytial Virus NOT DETECTED NOT DETECTED Final   Bordetella pertussis NOT DETECTED NOT DETECTED Final   Bordetella Parapertussis NOT DETECTED NOT DETECTED Final   Chlamydophila pneumoniae NOT DETECTED NOT DETECTED Final   Mycoplasma pneumoniae NOT DETECTED NOT DETECTED Final    Comment: Performed at [facility], [place]. [address] [place], [place]  [address]     Time coordinating discharge: 35 minutes  SIGNED:   Aline August, MD  [facility] 09/03/2021,  10:42 AM

## 2021-09-03 NOTE — Progress Notes (Signed)
PT Cancellation Note  Patient Details Name: Cynthia [name] MRN: [id number] DOB: 10-Mar-1945   Cancelled Treatment:    Reason Eval/Treat Not Completed: Other (comment) Pt ambulating in hallway with family and RN checking saturations.   [name] 09/03/2021, 10:15 AM Arlyce Dice, DPT Physical Therapist Acute Rehabilitation Services Preferred contact method: Secure Chat Weekend Pager Only: [phone] Office: [phone]

## 2021-09-03 NOTE — Plan of Care (Signed)

## 2021-09-03 NOTE — Progress Notes (Signed)
SATURATION QUALIFICATIONS: (This note is used to comply with regulatory documentation for home oxygen)  Patient Saturations on Room Air at Rest = 94%  Patient Saturations on Room Air while Ambulating = 94-95%   Please briefly explain why patient needs home oxygen: No need for home oxygen.

## 2021-09-03 NOTE — Progress Notes (Signed)
  Transition of Care [facility]) Screening Note   Patient Details  Name: Cynthia [name] Date of Birth: 11-May-1945   Transition of Care [facility]) CM/SW Contact:    Dessa Phi, RN Phone Number: 09/03/2021, 11:35 AM    Transition of Care Department [facility]) has reviewed patient and no TOC needs have been identified at this time. We will continue to monitor patient advancement through interdisciplinary progression rounds. If new patient transition needs arise, please place a TOC consult.

## 2021-09-04 ENCOUNTER — Inpatient Hospital Stay: Payer: Medicare PPO

## 2021-09-05 DIAGNOSIS — F419 Anxiety disorder, unspecified: Secondary | ICD-10-CM | POA: Diagnosis not present

## 2021-09-05 DIAGNOSIS — I10 Essential (primary) hypertension: Secondary | ICD-10-CM | POA: Diagnosis not present

## 2021-09-05 DIAGNOSIS — J189 Pneumonia, unspecified organism: Secondary | ICD-10-CM | POA: Diagnosis not present

## 2021-09-05 DIAGNOSIS — C689 Malignant neoplasm of urinary organ, unspecified: Secondary | ICD-10-CM | POA: Diagnosis not present

## 2021-09-05 DIAGNOSIS — D649 Anemia, unspecified: Secondary | ICD-10-CM | POA: Diagnosis not present

## 2021-09-05 LAB — CULTURE, BLOOD (ROUTINE X 2)
Culture: NO GROWTH
Culture: NO GROWTH
Special Requests: ADEQUATE

## 2021-09-11 ENCOUNTER — Ambulatory Visit (HOSPITAL_COMMUNITY): Admit: 2021-09-11 | Payer: Medicare PPO | Admitting: Gastroenterology

## 2021-09-11 ENCOUNTER — Encounter (HOSPITAL_COMMUNITY): Payer: Self-pay

## 2021-09-11 LAB — CUP PACEART REMOTE DEVICE CHECK
Date Time Interrogation Session: 20230905232546
Implantable Pulse Generator Implant Date: 20210521

## 2021-09-11 SURGERY — ESOPHAGOGASTRODUODENOSCOPY (EGD) WITH PROPOFOL
Anesthesia: Monitor Anesthesia Care | Laterality: Bilateral

## 2021-09-12 ENCOUNTER — Encounter (HOSPITAL_COMMUNITY): Payer: Self-pay

## 2021-09-12 ENCOUNTER — Emergency Department (HOSPITAL_BASED_OUTPATIENT_CLINIC_OR_DEPARTMENT_OTHER): Payer: Medicare PPO

## 2021-09-12 ENCOUNTER — Other Ambulatory Visit: Payer: Self-pay

## 2021-09-12 ENCOUNTER — Encounter (HOSPITAL_BASED_OUTPATIENT_CLINIC_OR_DEPARTMENT_OTHER): Payer: Self-pay | Admitting: *Deleted

## 2021-09-12 ENCOUNTER — Emergency Department (HOSPITAL_BASED_OUTPATIENT_CLINIC_OR_DEPARTMENT_OTHER): Payer: Medicare PPO | Admitting: Radiology

## 2021-09-12 ENCOUNTER — Inpatient Hospital Stay (HOSPITAL_BASED_OUTPATIENT_CLINIC_OR_DEPARTMENT_OTHER)
Admission: EM | Admit: 2021-09-12 | Discharge: 2021-09-14 | DRG: 193 | Disposition: A | Discharge status: Home / Self Care | Payer: Medicare PPO | Attending: Internal Medicine | Admitting: Internal Medicine

## 2021-09-12 DIAGNOSIS — Z806 Family history of leukemia: Secondary | ICD-10-CM

## 2021-09-12 DIAGNOSIS — Z923 Personal history of irradiation: Secondary | ICD-10-CM

## 2021-09-12 DIAGNOSIS — Z66 Do not resuscitate: Secondary | ICD-10-CM | POA: Diagnosis present

## 2021-09-12 DIAGNOSIS — I472 Ventricular tachycardia, unspecified: Secondary | ICD-10-CM | POA: Diagnosis not present

## 2021-09-12 DIAGNOSIS — F419 Anxiety disorder, unspecified: Secondary | ICD-10-CM | POA: Diagnosis present

## 2021-09-12 DIAGNOSIS — E785 Hyperlipidemia, unspecified: Secondary | ICD-10-CM | POA: Diagnosis present

## 2021-09-12 DIAGNOSIS — J189 Pneumonia, unspecified organism: Secondary | ICD-10-CM | POA: Diagnosis not present

## 2021-09-12 DIAGNOSIS — Z79890 Hormone replacement therapy (postmenopausal): Secondary | ICD-10-CM

## 2021-09-12 DIAGNOSIS — M1990 Unspecified osteoarthritis, unspecified site: Secondary | ICD-10-CM | POA: Diagnosis present

## 2021-09-12 DIAGNOSIS — E119 Type 2 diabetes mellitus without complications: Secondary | ICD-10-CM | POA: Diagnosis not present

## 2021-09-12 DIAGNOSIS — R059 Cough, unspecified: Secondary | ICD-10-CM | POA: Diagnosis not present

## 2021-09-12 DIAGNOSIS — R7989 Other specified abnormal findings of blood chemistry: Secondary | ICD-10-CM | POA: Diagnosis not present

## 2021-09-12 DIAGNOSIS — M5136 Other intervertebral disc degeneration, lumbar region: Secondary | ICD-10-CM | POA: Diagnosis present

## 2021-09-12 DIAGNOSIS — J9601 Acute respiratory failure with hypoxia: Secondary | ICD-10-CM | POA: Diagnosis present

## 2021-09-12 DIAGNOSIS — C689 Malignant neoplasm of urinary organ, unspecified: Secondary | ICD-10-CM | POA: Diagnosis present

## 2021-09-12 DIAGNOSIS — I4891 Unspecified atrial fibrillation: Secondary | ICD-10-CM | POA: Diagnosis not present

## 2021-09-12 DIAGNOSIS — Z20822 Contact with and (suspected) exposure to covid-19: Secondary | ICD-10-CM | POA: Diagnosis present

## 2021-09-12 DIAGNOSIS — I341 Nonrheumatic mitral (valve) prolapse: Secondary | ICD-10-CM | POA: Diagnosis present

## 2021-09-12 DIAGNOSIS — I248 Other forms of acute ischemic heart disease: Secondary | ICD-10-CM | POA: Diagnosis not present

## 2021-09-12 DIAGNOSIS — Z170 Estrogen receptor positive status [ER+]: Secondary | ICD-10-CM | POA: Diagnosis not present

## 2021-09-12 DIAGNOSIS — Z8049 Family history of malignant neoplasm of other genital organs: Secondary | ICD-10-CM

## 2021-09-12 DIAGNOSIS — C7801 Secondary malignant neoplasm of right lung: Secondary | ICD-10-CM | POA: Diagnosis present

## 2021-09-12 DIAGNOSIS — I440 Atrioventricular block, first degree: Secondary | ICD-10-CM | POA: Diagnosis present

## 2021-09-12 DIAGNOSIS — Y95 Nosocomial condition: Secondary | ICD-10-CM | POA: Diagnosis present

## 2021-09-12 DIAGNOSIS — G4700 Insomnia, unspecified: Secondary | ICD-10-CM | POA: Diagnosis not present

## 2021-09-12 DIAGNOSIS — Z803 Family history of malignant neoplasm of breast: Secondary | ICD-10-CM

## 2021-09-12 DIAGNOSIS — R0602 Shortness of breath: Secondary | ICD-10-CM | POA: Diagnosis not present

## 2021-09-12 DIAGNOSIS — C679 Malignant neoplasm of bladder, unspecified: Secondary | ICD-10-CM | POA: Diagnosis not present

## 2021-09-12 DIAGNOSIS — I480 Paroxysmal atrial fibrillation: Secondary | ICD-10-CM | POA: Diagnosis present

## 2021-09-12 DIAGNOSIS — E039 Hypothyroidism, unspecified: Secondary | ICD-10-CM | POA: Diagnosis not present

## 2021-09-12 DIAGNOSIS — R778 Other specified abnormalities of plasma proteins: Secondary | ICD-10-CM | POA: Diagnosis present

## 2021-09-12 DIAGNOSIS — Z79899 Other long term (current) drug therapy: Secondary | ICD-10-CM

## 2021-09-12 DIAGNOSIS — R0600 Dyspnea, unspecified: Secondary | ICD-10-CM | POA: Diagnosis present

## 2021-09-12 DIAGNOSIS — K219 Gastro-esophageal reflux disease without esophagitis: Secondary | ICD-10-CM | POA: Diagnosis present

## 2021-09-12 DIAGNOSIS — Z8041 Family history of malignant neoplasm of ovary: Secondary | ICD-10-CM

## 2021-09-12 DIAGNOSIS — Z7901 Long term (current) use of anticoagulants: Secondary | ICD-10-CM

## 2021-09-12 DIAGNOSIS — Z853 Personal history of malignant neoplasm of breast: Secondary | ICD-10-CM | POA: Diagnosis not present

## 2021-09-12 DIAGNOSIS — R000 Tachycardia, unspecified: Secondary | ICD-10-CM | POA: Diagnosis present

## 2021-09-12 DIAGNOSIS — Z8701 Personal history of pneumonia (recurrent): Secondary | ICD-10-CM

## 2021-09-12 DIAGNOSIS — G2581 Restless legs syndrome: Secondary | ICD-10-CM | POA: Diagnosis present

## 2021-09-12 DIAGNOSIS — Z79811 Long term (current) use of aromatase inhibitors: Secondary | ICD-10-CM

## 2021-09-12 DIAGNOSIS — I10 Essential (primary) hypertension: Secondary | ICD-10-CM | POA: Diagnosis not present

## 2021-09-12 DIAGNOSIS — D638 Anemia in other chronic diseases classified elsewhere: Secondary | ICD-10-CM | POA: Diagnosis present

## 2021-09-12 DIAGNOSIS — Z8553 Personal history of malignant neoplasm of renal pelvis: Secondary | ICD-10-CM

## 2021-09-12 DIAGNOSIS — Z95810 Presence of automatic (implantable) cardiac defibrillator: Secondary | ICD-10-CM

## 2021-09-12 LAB — RESP PANEL BY RT-PCR (FLU A&B, COVID) ARPGX2
Influenza A by PCR: NEGATIVE
Influenza B by PCR: NEGATIVE
SARS Coronavirus 2 by RT PCR: NEGATIVE

## 2021-09-12 LAB — TROPONIN I (HIGH SENSITIVITY)
Troponin I (High Sensitivity): 13 ng/L (ref <18)
Troponin I (High Sensitivity): 31 ng/L — ABNORMAL HIGH (ref <18)

## 2021-09-12 LAB — D-DIMER, QUANTITATIVE: D-Dimer, Quant: 0.93 ug/mL-FEU — ABNORMAL HIGH (ref 0.00–0.50)

## 2021-09-12 LAB — CBC
HCT: 31.3 % — ABNORMAL LOW (ref 36.0–46.0)
Hemoglobin: 10.2 g/dL — ABNORMAL LOW (ref 12.0–15.0)
MCH: 32.4 pg (ref 26.0–34.0)
MCHC: 32.6 g/dL (ref 30.0–36.0)
MCV: 99.4 fL (ref 80.0–100.0)
Platelets: 157 10*3/uL (ref 150–400)
RBC: 3.15 MIL/uL — ABNORMAL LOW (ref 3.87–5.11)
RDW: 19.3 % — ABNORMAL HIGH (ref 11.5–15.5)
WBC: 12.3 10*3/uL — ABNORMAL HIGH (ref 4.0–10.5)
nRBC: 0 % (ref 0.0–0.2)

## 2021-09-12 LAB — BASIC METABOLIC PANEL
Anion gap: 11 (ref 5–15)
BUN: 11 mg/dL (ref 8–23)
CO2: 27 mmol/L (ref 22–32)
Calcium: 9.4 mg/dL (ref 8.9–10.3)
Chloride: 101 mmol/L (ref 98–111)
Creatinine, Ser: 0.92 mg/dL (ref 0.44–1.00)
GFR, Estimated: >60 mL/min (ref >60)
Glucose, Bld: 91 mg/dL (ref 70–99)
Potassium: 3.9 mmol/L (ref 3.5–5.1)
Sodium: 139 mmol/L (ref 135–145)

## 2021-09-12 LAB — LACTIC ACID, PLASMA
Lactic Acid, Venous: 1 mmol/L (ref 0.5–1.9)
Lactic Acid, Venous: 1.3 mmol/L (ref 0.5–1.9)

## 2021-09-12 MED ORDER — ALPRAZOLAM 0.5 MG PO TABS
1.0000 mg | ORAL_TABLET | Freq: Every evening | ORAL | Status: DC | PRN
  Administered 2021-09-12 – 2021-09-13 (×2): 1 mg via ORAL
  Filled 2021-09-12 (×2): qty 2

## 2021-09-12 MED ORDER — ACETAMINOPHEN 500 MG PO TABS
1000.0000 mg | ORAL_TABLET | Freq: Once | ORAL | Status: AC
  Administered 2021-09-12: 1000 mg via ORAL
  Filled 2021-09-12: qty 2

## 2021-09-12 MED ORDER — PSYLLIUM 0.52 G PO CAPS: 3.6400 g | ORAL_CAPSULE | ORAL | Status: DC

## 2021-09-12 MED ORDER — LIDOCAINE-PRILOCAINE 2.5-2.5 % EX CREA
1.0000 | TOPICAL_CREAM | CUTANEOUS | Status: DC | PRN
Start: 2021-09-12 — End: 2021-09-14

## 2021-09-12 MED ORDER — DILTIAZEM HCL 25 MG/5ML IV SOLN
15.0000 mg | Freq: Once | INTRAVENOUS | Status: AC
Start: 2021-09-12 — End: 2021-09-12
  Administered 2021-09-12: 15 mg via INTRAVENOUS
  Filled 2021-09-12: qty 5

## 2021-09-12 MED ORDER — APIXABAN 2.5 MG PO TABS
5.0000 mg | ORAL_TABLET | Freq: Two times a day (BID) | ORAL | Status: DC
  Administered 2021-09-12 – 2021-09-14 (×4): 5 mg via ORAL
  Filled 2021-09-12 (×4): qty 2

## 2021-09-12 MED ORDER — SODIUM CHLORIDE 0.9 % IV SOLN
2.0000 g | Freq: Two times a day (BID) | INTRAVENOUS | Status: DC
  Administered 2021-09-12 – 2021-09-14 (×4): 2 g via INTRAVENOUS
  Filled 2021-09-12 (×4): qty 12.5

## 2021-09-12 MED ORDER — IOHEXOL 350 MG/ML SOLN
60.0000 mL | Freq: Once | INTRAVENOUS | Status: AC | PRN
  Administered 2021-09-12: 60 mL via INTRAVENOUS

## 2021-09-12 MED ORDER — PANTOPRAZOLE SODIUM 40 MG IV SOLR
40.0000 mg | Freq: Two times a day (BID) | INTRAVENOUS | Status: DC
  Administered 2021-09-12 – 2021-09-13 (×3): 40 mg via INTRAVENOUS
  Filled 2021-09-12 (×3): qty 10

## 2021-09-12 MED ORDER — SODIUM CHLORIDE 0.9 % IV SOLN: 2.0000 g | Freq: Two times a day (BID) | INTRAVENOUS | Status: DC

## 2021-09-12 MED ORDER — VANCOMYCIN HCL IN DEXTROSE 1-5 GM/200ML-% IV SOLN: 1000.0000 mg | INTRAVENOUS | Status: DC

## 2021-09-12 MED ORDER — ANASTROZOLE 1 MG PO TABS
1.0000 mg | ORAL_TABLET | Freq: Every day | ORAL | Status: DC
Start: 2021-09-12 — End: 2021-09-13

## 2021-09-12 MED ORDER — LEVOTHYROXINE SODIUM 112 MCG PO TABS
112.0000 ug | ORAL_TABLET | Freq: Every day | ORAL | Status: DC
  Administered 2021-09-12 – 2021-09-13 (×2): 112 ug via ORAL
  Filled 2021-09-12 (×2): qty 1

## 2021-09-12 MED ORDER — SODIUM CHLORIDE 0.9 % IV SOLN: 1.0000 g | Freq: Once | INTRAVENOUS | Status: DC

## 2021-09-12 MED ORDER — VANCOMYCIN HCL IN DEXTROSE 1-5 GM/200ML-% IV SOLN
1000.0000 mg | Freq: Once | INTRAVENOUS | Status: AC
  Administered 2021-09-12: 1000 mg via INTRAVENOUS
  Filled 2021-09-12: qty 200

## 2021-09-12 MED ORDER — PANTOPRAZOLE SODIUM 40 MG PO TBEC
40.0000 mg | DELAYED_RELEASE_TABLET | ORAL | Status: DC
Start: 2021-09-12 — End: 2021-09-12

## 2021-09-12 MED ORDER — VITAMIN D-3 25 MCG (1000 UT) PO CAPS: 1000.0000 [IU] | ORAL_CAPSULE | Freq: Every day | ORAL | Status: DC

## 2021-09-12 MED ORDER — FERROUS SULFATE 325 (65 FE) MG PO TABS
325.0000 mg | ORAL_TABLET | Freq: Every day | ORAL | Status: DC
  Administered 2021-09-12 – 2021-09-13 (×2): 325 mg via ORAL
  Filled 2021-09-12 (×2): qty 1

## 2021-09-12 MED ORDER — FENOFIBRATE 160 MG PO TABS
160.0000 mg | ORAL_TABLET | Freq: Every day | ORAL | Status: DC
  Administered 2021-09-13 – 2021-09-14 (×2): 160 mg via ORAL
  Filled 2021-09-12 (×2): qty 1

## 2021-09-12 MED ORDER — ALBUTEROL SULFATE HFA 108 (90 BASE) MCG/ACT IN AERS: 2.0000 | INHALATION_SPRAY | RESPIRATORY_TRACT | Status: DC | PRN

## 2021-09-12 MED ORDER — PROCHLORPERAZINE MALEATE 10 MG PO TABS: 10.0000 mg | ORAL_TABLET | Freq: Four times a day (QID) | ORAL | Status: DC | PRN

## 2021-09-12 MED ORDER — SODIUM CHLORIDE 0.9 % IV BOLUS
1000.0000 mL | Freq: Once | INTRAVENOUS | Status: AC
  Administered 2021-09-12: 1000 mL via INTRAVENOUS

## 2021-09-12 MED ORDER — ALBUTEROL SULFATE (2.5 MG/3ML) 0.083% IN NEBU
3.0000 mL | INHALATION_SOLUTION | RESPIRATORY_TRACT | Status: DC | PRN
Start: 2021-09-12 — End: 2021-09-14

## 2021-09-12 MED ORDER — TRAZODONE HCL 50 MG PO TABS
50.0000 mg | ORAL_TABLET | Freq: Every day | ORAL | Status: DC
  Administered 2021-09-12 – 2021-09-13 (×2): 50 mg via ORAL
  Filled 2021-09-12 (×2): qty 1

## 2021-09-12 NOTE — ED Triage Notes (Addendum)
Pt is here for shortness of breath for 2 days as well as a lack of energy.  Pt was hospitalized for pneumonia recently (in the last 2 weeks) for pneumonia at [facility] long for 3 days. Pt states that she has hx of afib and exerted herself yesterday and felt like she was in afib.  Pt reports that her symptoms are increased with activity and improve with rest.  No CP.

## 2021-09-12 NOTE — Assessment & Plan Note (Signed)
Secondary to recurrent right-sided pneumonia.  Patient recently discharged 2 weeks ago for multifocal pneumonia.  Likely recurrent due to her immunosuppression while on chemotherapy for bladder cancer. -Flu/COVID PCR negative. -Continue IV vancomycin and cefepime -Maintain O2 >92%

## 2021-09-12 NOTE — Progress Notes (Signed)
[employer] Summary Report / Loop Recorder 

## 2021-09-12 NOTE — ED Provider Notes (Signed)
[facility] EMERGENCY DEPT Provider Note   CSN: [id number] Arrival date & time: 09/12/21  1346     History  Chief Complaint  Patient presents with   Shortness of Breath    Cynthia [name] is a 76 y.o. female multiple previous neoplasms including breast cancer, kidney cancer with kidney removal, patient now with only 1 kidney, she is currently being treated for bladder cancer, received last round of chemo on 8/9. Patient with recent admission for pneumonia, admitted 08/31/2021, discharged 09/03/2021. Patient reports after discharge she had some delayed recovery but over the last 3 to 4 days she has felt overall well, until yesterday she began feeling weak, fatigued, and having shortness of breath with exertion.  She denies any fever, chills, chest pain, nausea, vomiting associated with her exertional shortness of breath.  She denies any previous history of blood clots and does currently take blood thinners secondary to her paroxysmal A-fib.  She thinks that she may have gone into a period of A-fib yesterday but is unsure.  She was discharged on antibiotics for pneumonia which she finished two days ago, had been taking oral ceftin, was on cefepime and doxy while hospitalized.    Shortness of Breath      Home Medications Prior to Admission medications   Medication Sig Start Date End Date Taking? Authorizing Provider  acetaminophen (TYLENOL) 500 MG tablet Take 500-1,000 mg by mouth See admin instructions. Take 1,000 mg by mouth at bedtime and an additional 500-1,000 mg once a day as needed for mild pain or headaches    [provider]  albuterol (VENTOLIN HFA) 108 (90 Base) MCG/ACT inhaler Inhale 2 puffs into the lungs every 4 (four) hours as needed for wheezing or shortness of breath. 09/03/21   Aline August, MD  ALPRAZolam Duanne Moron) 1 MG tablet Take 0.5-1.5 mg by mouth See admin instructions. Take 1.5 mg by mouth at bedtime and an additional 0.5 mg once a day as needed for  anxiety 12/09/18   [provider]  amiodarone (PACERONE) 100 MG tablet Take 1 tablet (100 mg total) by mouth daily. 06/26/21   Deboraha Sprang, MD  anastrozole (ARIMIDEX) 1 MG tablet TAKE 1 TABLET BY MOUTH EVERY DAY Patient taking differently: Take 1 mg by mouth in the morning. 10/08/20   Magrinat, [name], MD  apixaban (ELIQUIS) 5 MG TABS tablet TAKE 1 TABLET BY MOUTH TWICE A DAY Patient taking differently: Take 5 mg by mouth 2 (two) times daily. 01/18/21   Deboraha Sprang, MD  Cholecalciferol (VITAMIN D-3) 25 MCG (1000 UT) CAPS Take 1,000-2,000 Units by mouth daily with breakfast.    [provider]  Choline Fenofibrate (FENOFIBRIC ACID) 135 MG CPDR Take 135 mg by mouth daily.  01/16/16   [provider]  docusate sodium (COLACE) 100 MG capsule Take 200 mg by mouth See admin instructions. Take 200 mg by mouth at bedtime, every other night- when not taking Metamucil capsules    [provider]  ferrous sulfate 325 (65 FE) MG tablet Take 325 mg by mouth at bedtime.    [provider]  levothyroxine (SYNTHROID, LEVOTHROID) 112 MCG tablet Take 112 mcg by mouth at bedtime.  03/13/16   [provider]  lidocaine-prilocaine (EMLA) cream Apply 1 Application topically as needed. Patient taking differently: Apply 1 Application topically as needed (FOR PORT ACCESS). 07/05/21   Wyatt Portela, MD  METAMUCIL 0.52 g capsule Take 3.64-4.16 g by mouth See admin instructions. Take 3.65-4.16 grams (7-8  capsules) by mouth at bedtime every other night, when not taking colace capsules    [provider]  pantoprazole (PROTONIX) 40 MG tablet Take 40 mg by mouth See admin instructions. Take 40 mg by mouth 30 minutes before breakfast and at bedtime 05/20/21   [provider]  prochlorperazine (COMPAZINE) 10 MG tablet Take 1 tablet (10 mg total) by mouth every 6 (six) hours as needed for nausea or vomiting. 07/05/21   Wyatt Portela, MD  traZODone (DESYREL)  50 MG tablet Take 1 tablet (50 mg total) by mouth at bedtime. 09/03/21   Aline August, MD      Allergies    Patient has no known allergies.    Review of Systems   Review of Systems  Respiratory:  Positive for shortness of breath.   All other systems reviewed and are negative.   Physical Exam Updated Vital Signs BP (!) 178/103   Pulse (!) 108   Temp 99.8 F (37.7 C) (Oral)   Resp 17   Wt 74.8 kg   SpO2 94%   BMI 27.46 kg/m  Physical Exam Vitals and nursing note reviewed.  Constitutional:      General: She is not in acute distress.    Appearance: Normal appearance.     Comments: Patient somewhat chronically ill-appearing without acute toxic or septic appearance today.  HENT:     Head: Normocephalic and atraumatic.  Eyes:     General:        Right eye: No discharge.        Left eye: No discharge.  Cardiovascular:     Rate and Rhythm: Normal rate and regular rhythm.     Heart sounds: No murmur heard.    No friction rub. No gallop.  Pulmonary:     Effort: Pulmonary effort is normal.     Breath sounds: Normal breath sounds.     Comments: Scattered rhonchi, no wheezing, focal consolidation noted. Abdominal:     General: Bowel sounds are normal.     Palpations: Abdomen is soft.  Skin:    General: Skin is warm and dry.     Capillary Refill: Capillary refill takes less than 2 seconds.  Neurological:     Mental Status: She is alert and oriented to person, place, and time.  Psychiatric:        Mood and Affect: Mood normal.        Behavior: Behavior normal.     ED Results / Procedures / Treatments   Labs (all labs ordered are listed, but only abnormal results are displayed) Labs Reviewed  CBC - Abnormal; Notable for the following components:      Result Value   WBC 12.3 (*)    RBC 3.15 (*)    Hemoglobin 10.2 (*)    HCT 31.3 (*)    RDW 19.3 (*)    All other components within normal limits  D-DIMER, QUANTITATIVE - Abnormal; Notable for the following components:    D-Dimer, Quant 0.93 (*)    All other components within normal limits  TROPONIN I (HIGH SENSITIVITY) - Abnormal; Notable for the following components:   Troponin I (High Sensitivity) 31 (*)    All other components within normal limits  RESP PANEL BY RT-PCR (FLU A&B, COVID) ARPGX2  BASIC METABOLIC PANEL  LACTIC ACID, PLASMA  LACTIC ACID, PLASMA  URINALYSIS, ROUTINE W REFLEX MICROSCOPIC  TROPONIN I (HIGH SENSITIVITY)    EKG None  Radiology CT Angio Chest PE W and/or Wo Contrast  Result Date: 09/12/2021 CLINICAL DATA:  Pulmonary embolism (PE) suspected, positive D-dimer. Pt is here for shortness of breath for 2 days as well as a lack of energy. Pt was hospitalized for pneumonia recently (in the last 2 weeks) for pneumonia at [facility] long for 3 days. Pt states that she has hx of afib and exerted EXAM: CT ANGIOGRAPHY CHEST WITH CONTRAST TECHNIQUE: Multidetector CT imaging of the chest was performed using the standard protocol during bolus administration of intravenous contrast. Multiplanar CT image reconstructions and MIPs were obtained to evaluate the vascular anatomy. RADIATION DOSE REDUCTION: This exam was performed according to the departmental dose-optimization program which includes automated exposure control, adjustment of the mA and/or kV according to patient size and/or use of iterative reconstruction technique. CONTRAST:  59m OMNIPAQUE IOHEXOL 350 MG/ML SOLN COMPARISON:  PET CT 07/29/2021 FINDINGS: Cardiovascular: Right chest wall Port-A-Cath with tip terminating at the superior cavoatrial junction. Satisfactory opacification of the pulmonary arteries to the segmental level. No evidence of pulmonary embolism. The main pulmonary artery measures borderline enlarged. Normal heart size. No significant pericardial effusion. The thoracic aorta is normal in caliber. Mild atherosclerotic plaque of the thoracic aorta. Four-vessel coronary artery calcifications. Mediastinum/Nodes: Borderline enlarged  1.3 cm right hilar lymph node. Prominent but nonenlarged left hilar lymph node. No enlarged mediastinal, left hilar, or axillary lymph nodes. Thyroid gland, trachea, and esophagus demonstrate no significant findings. Lungs/Pleura: Patchy nodular-like airspace opacities within the right lung. No left pulmonary nodule. No pulmonary mass. Trace right pleural effusion. No left pleural effusion. No pneumothorax. Upper Abdomen: Status post cholecystectomy. Punctate calcification within the spleen sequela prior granulomatous disease. Otherwise no acute abnormality. Musculoskeletal: Right breast dermal thickening. Surgical clips overlie interval increase in size and conspicuity of a 2.2 x 1.2 cm (from 1.3 x 0.9 cm) soft tissue density within the right breast. No suspicious lytic or blastic osseous lesions. No acute displaced fracture. Multilevel degenerative changes of the spine. Review of the MIP images confirms the above findings. IMPRESSION: 1. Patchy nodular-like airspace opacities of the right lung suggestive of infection/inflammation. Recommend follow-up in 3 months to evaluate for resolution. 2. Trace right pleural effusion. 3. Borderline enlarged right hilar lymph node likely reactive in etiology. Recommend attention on follow-up. 4. Right breast dermal thickening. Surgical clips overlie interval increase in size and conspicuity of a 2.2 x 1.2 cm (from 1.3 x 0.9 cm) soft tissue density within the right breast. Correlate with physical exam. If clinically indicated, consider diagnostic mammographic evaluation. Electronically Signed   By: MIven FinnM.D.   On: 09/12/2021 18:34   DG Chest 2 View  Result Date: 09/12/2021 CLINICAL DATA:  Shortness of breath, cough. Recent diagnosis of pneumonia. EXAM: CHEST - 2 VIEW COMPARISON:  August 31, 2021 FINDINGS: EKG leads project over the chest. RIGHT-sided Port-A-Cath tip at the caval to atrial junction. Cardiac loop recorder projects over the chest. Patchy opacities  with vaguely nodular appearance persist in the RIGHT chest. Increased perihilar airspace disease adjacent to and inferior to the RIGHT hilum. Cardiomediastinal contours and hilar structures with top-normal heart size. Mild increased interstitial markings perhaps with Kerley B lines. No lobar consolidation. No sign of pleural effusion. On limited assessment no acute skeletal findings. IMPRESSION: 1. Patchy opacities with vaguely nodular appearance in the RIGHT chest. Increased perihilar airspace disease adjacent to and inferior to the RIGHT hilum. While findings could reflect infection, in a patient with history of bladder neoplasm would suggest either close follow-up PA and lateral chest after therapy or  chest CT for further evaluation. 2. Query background mild interstitial edema. Electronically Signed   By: Zetta Bills M.D.   On: 09/12/2021 14:38   CUP PACEART REMOTE DEVICE CHECK  Result Date: 09/11/2021 ILR summary report received. Battery status OK. Normal device function. No new symptom, tachy, brady, or pause episodes. Monthly summary reports and ROV/PRN AF episodes/compass up to 12hrs in duration, burden 13.4%, Eliquis, Amiodarone.  No EGM's for review. LA   Procedures Procedures    Medications Ordered in ED Medications  albuterol (VENTOLIN HFA) 108 (90 Base) MCG/ACT inhaler 2 puff (has no administration in time range)  ceFEPIme (MAXIPIME) 2 g in sodium chloride 0.9 % 100 mL IVPB (2 g Intravenous New Bag/Given 09/12/21 1945)  anastrozole (ARIMIDEX) tablet 1 mg (has no administration in time range)  apixaban (ELIQUIS) tablet 5 mg (has no administration in time range)  Vitamin D-3 CAPS 1,000-2,000 Units (has no administration in time range)  Fenofibric Acid CPDR 135 mg (has no administration in time range)  ferrous sulfate tablet 325 mg (has no administration in time range)  levothyroxine (SYNTHROID) tablet 112 mcg (has no administration in time range)  traZODone (DESYREL) tablet 50 mg (has  no administration in time range)  prochlorperazine (COMPAZINE) tablet 10 mg (has no administration in time range)  pantoprazole (PROTONIX) EC tablet 40 mg (has no administration in time range)  psyllium (REGULOID) capsule 3.64-4.16 g (has no administration in time range)  lidocaine-prilocaine (EMLA) cream 1 Application (has no administration in time range)  vancomycin (VANCOCIN) IVPB 1000 mg/200 mL premix (0 mg Intravenous Stopped 09/12/21 1859)  iohexol (OMNIPAQUE) 350 MG/ML injection 60 mL (60 mLs Intravenous Contrast Given 09/12/21 1808)  sodium chloride 0.9 % bolus 1,000 mL (1,000 mLs Intravenous New Bag/Given 09/12/21 1929)  diltiazem (CARDIZEM) injection 15 mg (15 mg Intravenous Given 09/12/21 1934)  acetaminophen (TYLENOL) tablet 1,000 mg (1,000 mg Oral Given 09/12/21 1935)    ED Course/ Medical Decision Making/ A&P                           Medical Decision Making Amount and/or Complexity of Data Reviewed Labs: ordered. Radiology: ordered.  Risk OTC drugs. Prescription drug management. Decision regarding hospitalization.   This patient is a 76 y.o. female who presents to the ED for concern of shortness of breath, fatigue, shortness of breath with exertion, this involves an extensive number of treatment options, and is a complaint that carries with it a high risk of complications and morbidity. The emergent differential diagnosis prior to evaluation includes, but is not limited to, new episode of A-fib, ACS, hospital-acquired pneumonia, bronchitis, COVID, flu, new metastases to lungs from known primary cancer in the bladder, other cardiac arrhythmia, other upper respiratory infection, symptomatic anemia, pulmonary embolism versus other.   This is not an exhaustive differential.   Past Medical History / Co-morbidities / Social History: breast cancer, kidney cancer with kidney removal, patient now with only 1 kidney, she is currently being treated for bladder cancer, received last round of  chemo on 8/9. Patient with recent admission for pneumonia, admitted 08/31/2021, discharged 09/03/2021  Additional history: Chart reviewed. Pertinent results include: Reviewed lab work, imaging from recent previous emergency department visits, and admission  Physical Exam: Physical exam performed. The pertinent findings include: Patient with hypoxia on arrival, maintaining oxygen saturations at 89%, low 90s on room air, improved on nasal cannula.  She arrives with normal heart rate and rhythm, however she is having  episodes of tachycardia, as well as severe hypertension with blood pressure max systolic 323, max diastolic 557.  She is afebrile, otherwise no acute distress, she has had some episodes of tachypnea.  Lab Tests: I ordered, and personally interpreted labs.  The pertinent results include: BMP unremarkable, lactic acid normal, initial troponin is negative, however repeat with elevation at 31, suspect some demand ischemia secondary to tachycardia.  Her D-dimer is elevated at 0.93, given her history of cancer may be secondary to her malignancy, however patient high risk for possible breakthrough PE even despite anticoagulation especially given history of malignancy.  CBC remarkable for leukocytosis, white blood cells at 12.3, hemoglobin of 10.2.  With elevated blood cells, questionable pneumonia on chest x-ray, and patient's recent cough as well as worsening shortness of breath concern for help care associated pneumonia.   Imaging Studies: I ordered imaging studies including chest x-ray, CT study. I independently visualized and interpreted imaging which showed questionable patchy right-sided infiltrate versus new metastasis versus other, no evidence of pulmonary embolism. I agree with the radiologist interpretation.   Cardiac Monitoring:  The patient was maintained on a cardiac monitor.  My attending physician Dr. Langston Masker viewed and interpreted the cardiac monitored which showed an underlying rhythm  of: Normal sinus rhythm on arrival, patient began having A-fib with RVR after emergency department stay. I agree with this interpretation.   Medications: I ordered medication including cefepime, vanc, diltiazem bolus, Tylenol, fluid bolus for A-fib, RVR, pneumonia, headache. Reevaluation of the patient after these medicines showed that the patient improved. I have reviewed the patients home medicines and have made adjustments as needed.  Ventricular rate improved somewhat after diltiazem and fluid bolus, 108 on last recheck.  Patient reports improvement of her headache after Tylenol.  She will require reevaluation during her hospital admission to assess her response to antibiotic treatment for pneumonia  Consultations Obtained: I requested consultation with the hospitalist, spoke with Dr. Cyd Silence,  and discussed lab and imaging findings as well as pertinent plan - they recommend: Admission for pneumonia   Disposition: After consideration of the diagnostic results and the patients response to treatment, I feel that patient would benefit from admission as discussed above.   emergency department workup does not suggest an emergent condition requiring admission or immediate intervention beyond what has been performed at this time. The plan is: as above. The patient is safe for discharge and has been instructed to return immediately for worsening symptoms, change in symptoms or any other concerns.  I discussed this case with my attending physician Dr. Langston Masker who cosigned this note including patient's presenting symptoms, physical exam, and planned diagnostics and interventions. Attending physician stated agreement with plan or made changes to plan which were implemented.    Final Clinical Impression(s) / ED Diagnoses Final diagnoses:  HCAP (healthcare-associated pneumonia)    Rx / DC Orders ED Discharge Orders     None         Dorien Chihuahua 09/12/21 2016    Fransico Meadow, MD 09/13/21 2018

## 2021-09-12 NOTE — Assessment & Plan Note (Signed)
Subsequently had RVR with heart rate up to 120 while being evaluated in the ED.  She was given fluid bolus and 15 mg of diltiazem with improvement in her rate. -Keep on continuous telemetry.  Continue home amiodarone. -Continue Eliquis

## 2021-09-12 NOTE — Assessment & Plan Note (Signed)
Continue levothyroxine 

## 2021-09-12 NOTE — Assessment & Plan Note (Signed)
Controlled.  Not on antihypertensives at home.

## 2021-09-12 NOTE — Assessment & Plan Note (Signed)
Elevated troponin at 13 and 31. Likely demand from a.fib RVR.

## 2021-09-12 NOTE — Assessment & Plan Note (Addendum)
Stable with hemoglobin 10 which is around her baseline

## 2021-09-12 NOTE — Progress Notes (Addendum)
Pharmacy Antibiotic Note  Cynthia [name] is a 76 y.o. female for which pharmacy has been consulted for vancomycin dosing for pneumonia.  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 Patient presenting with SOB/fatigue.  SCr 0.92 WBC 12.3; LA 1>1.3; T 98.4>99.6; HR 73>106; RR 20>16 COVID/Flu - neg  Plan: Cefepime 2g q12hr Vancomycin 1000 mg once in the ED ---Will start 1000 mg q24hr (eAUC 441.7) unless change in renal function Trend WBC, Fever, Renal function F/u cultures, clinical course, WBC, fever De-escalate when able Levels at steady state F/u MRSA PCR  Weight: 74.8 kg (165 lb)  Temp (24hrs), Avg:98.4 F (36.9 C), Min:98.4 F (36.9 C), Max:98.4 F (36.9 C)  Recent Labs  Lab 09/12/21 1438 09/12/21 1449  WBC  --  12.3*  CREATININE  --  0.92  LATICACIDVEN 1.0  --     Estimated Creatinine Clearance: 51 mL/min (by C-G formula based on SCr of 0.92 mg/dL).    No Known Allergies  Antimicrobials this admission: cefepime 9/7 >>  vancomycin 9/7 >>  Microbiology results: Pending  Thank you for allowing pharmacy to be a part of this patient's care.  Lorelei Pont, PharmD, BCPS 09/12/2021 4:17 PM ED Clinical Pharmacist -  [phone]

## 2021-09-12 NOTE — Assessment & Plan Note (Signed)
S/p right lumpectomy in 2018 -CTA chest shows a Interval increase in size and conspicuity of a soft tissue density within the right breast.  She reports having a recent mammogram but has not heard back from the results.  Last mammogram on record was on 08/2020.  Have advised her to reach out as soon as possible to find results of her recent mammogram or will need to have a repeat if she has not had one this year -Continue anastrozole

## 2021-09-12 NOTE — Assessment & Plan Note (Signed)
Diet controlled.  

## 2021-09-12 NOTE — ED Notes (Signed)
[place] assessment note: Pt. seen in Triage-1, RT assessment obtained/charted, no wheeze auscultated, clear b/l b.s. noted and has not used her Albuterol Inhaler she received after recent admission for PNA, had some recent occasional new small # yellow productive cough.

## 2021-09-12 NOTE — Assessment & Plan Note (Signed)
Continue trazodone and Xanax

## 2021-09-12 NOTE — H&P (Addendum)
History and Physical    PatientGennell [name] XTG:[id number] DOB: 12/14/1945 DOA: 09/12/2021 DOS: the patient was seen and examined on 09/12/2021 PCP: Saintclair Halsted, FNP  Patient coming from:  Outside ED  Chief Complaint:  Chief Complaint  Patient presents with   Shortness of Breath   HPI: Cynthia [name] is a 76 y.o. 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  She was recently hospitalized from 08/31/2021 - 09/03/2021 for acute hypoxic respiratory failure secondary to multifocal pneumonia.  RVP, strep pneumo urine antigen and COVID test was negative.  She was discharged home on 5 more days of ceftin.  Initially had 3 to 4 days where she felt great.  However today she noted increased fatigue which sometimes is her symptoms when she has trouble with her A-fib.  Also had increasing shortness of breath with increased cough.  Check BP at home was elevated with SBP up to 200.  Decided to present to outside ED.  In the ED, she was afebrile and initially hypoxic down to 89% requiring nasal cannula.  She also later developed atrial fibrillation with RVR with rates up to 120.  She was given fluid bolus and 15 mg of diltiazem with improvement in her heart rate.  WBC of 12.3 K.  BMP unremarkable. Elevated troponin at 13 and 31.  Negative flu and COVID PCR.  UA is negative.  CTA chest showing patchy nodular-like airspace opacity of the right lung suggestive of inflammation/infection.  She was started on IV vancomycin and cefepime.  Transfer then made here to [facility] for further management. Review of Systems: As mentioned in the history of present illness. All other systems reviewed and are negative. Past Medical History:  Diagnosis Date   AICD (automatic cardioverter/defibrillator) present    Medtronic    Anticoagulant long-term use    eliquis--- managed by cardiology   Anxiety    Arthritis    Bladder cancer ([place])    Bradycardia    Cancer of kidney ([place])    left   DDD (degenerative disc disease), lumbar    Depression    Dyspnea    ON EXERTION   First degree heart block    Genetic testing 06/25/2016   Ms. Slagter underwent genetic counseling and testing for hereditary cancer syndromes on 06/17/2016. Her results were negative for mutations in all 46 genes analyzed by [employer]'s 46-gene Common Hereditary Cancers Panel. Genes analyzed include: APC, ATM, AXIN2, BARD1, BMPR1A, BRCA1, BRCA2, BRIP1, CDH1, CDKN2A, CHEK2, CTNNA1, DICER1, EPCAM, GREM1, HOXB13, KIT, MEN1, MLH1, MSH2, MSH3, MSH6, MUTYH, NBN,   GERD (gastroesophageal reflux disease)    occasional,  takes pepcid   Hematuria    History of radiation therapy 05/22/2016 to 06/20/2016   right breast cancer   Hypertension    followd by pcp---  (02-22-2019 per pt never had stress test)   Hypothyroidism    followed by pcp   IBS (irritable bowel syndrome)    Incomplete right bundle branch block    Insomnia    Malignant neoplasm of upper-inner quadrant of right breast in female, estrogen receptor positive [facility]) oncologist--- dr [name]   dx 03/ 2018,  Stage IA, IDC, ER/PR +;  04-01-2016 s/p  right breast lumpectomy w/ node dissection's;  completed radiation 06-20-2016   MVP (mitral valve prolapse)    per echo 12-11-2018 in epic, mild  NSVT (nonsustained ventricular tachycardia) ([place]) followed by cardiology   12-10-2018  hospital admission ,  refer to discharge note 12-14-2018 for treatement   PAF (paroxysmal atrial fibrillation) [facility]) primary cardiologist--- dr Margaretann Loveless   newly dx 12-10-2018  admission in epic, Afib w/ RVR and NSVT;   in epic TTE 12-11-2018 showed ef 50-55%, mild MVP,  mild AV sclerosis without stenosis;   Cardiac MRI 12-13-2018 in epic   Renal mass, left    pelvis    Restless leg syndrome    occasional   Type 2  diabetes mellitus ([place])    followed by pcp---  (02-22-2019 does not check blood sugar's)   Past Surgical History:  Procedure Laterality Date   BREAST LUMPECTOMY WITH RADIOACTIVE SEED AND SENTINEL LYMPH NODE BIOPSY Right 04/01/2016   Procedure: RADIOACTIVE SEED GUIDED RIGHT BREAST LUMPECTOMY WITH RIGHT AXILLARY SENTINEL LYMPH NODE BIOPSY.;  Surgeon: Fanny Skates, MD;  Location: [place] [facility];  Service: General;  Laterality: Right;   CARDIAC CATHETERIZATION  12/11/2018   CATARACT EXTRACTION W/ INTRAOCULAR LENS  IMPLANT, BILATERAL  1980s   COLONOSCOPY     CYSTOSCOPY W/ RETROGRADES Right 06/05/2021   Procedure: CYSTOSCOPY WITH RETROGRADE PYELOGRAM;  Surgeon: Alexis Frock, MD;  Location: [facility];  Service: Urology;  Laterality: Right;   CYSTOSCOPY/RETROGRADE/URETEROSCOPY N/A 03/02/2019   Procedure: CYSTOSCOPY/LEFT RETROGRADE/URETEROSCOPY/  BIOPSY/  STENT;  Surgeon: Ceasar Mons, MD;  Location: [facility];  Service: Urology;  Laterality: N/A;   INCISIONAL HERNIA REPAIR N/A 03/29/2020   Procedure: REPAIR OF INCISIONAL HERNIA WITH MESH;  Surgeon: Erroll Luna, MD;  Location: [place];  Service: General;  Laterality: N/A;   INSERTION OF ICD     medtronic   IR IMAGING GUIDED PORT INSERTION  07/18/2021   LAPAROSCOPIC CHOLECYSTECTOMY  1990s   ROBOT ASSITED LAPAROSCOPIC NEPHROURETERECTOMY Left 04/27/2019   Procedure: XI ROBOT ASSITED LAPAROSCOPIC NEPHROURETERECTOMY;  Surgeon: Alexis Frock, MD;  Location: [facility];  Service: Urology;  Laterality: Left;  3.5 HRS   TONSILLECTOMY  age 64   TRANSURETHRAL RESECTION OF BLADDER TUMOR N/A 06/05/2021   Procedure: TRANSURETHRAL RESECTION OF BLADDER TUMOR (TURBT);  Surgeon: Alexis Frock, MD;  Location: [facility];  Service: Urology;  Laterality: N/A;   Social History:  reports that she has never smoked. She has never used smokeless tobacco. She reports current alcohol use. She reports that she does not use drugs.  No Known Allergies  Family  History  Problem Relation Age of Onset   Breast cancer Sister 45       d.54   Leukemia Brother 14   Cervical cancer Sister 64   Ovarian cancer Maternal Aunt 92       d.92s   Breast cancer Other 51    Prior to Admission medications   Medication Sig Start Date End Date Taking? Authorizing Provider  acetaminophen (TYLENOL) 500 MG tablet Take 500-1,000 mg by mouth See admin instructions. Take 1,000 mg by mouth at bedtime and an additional 500-1,000 mg once a day as needed for mild pain or headaches    [provider]  albuterol (VENTOLIN HFA) 108 (90 Base) MCG/ACT inhaler Inhale 2 puffs into the lungs every 4 (four) hours as needed for wheezing or shortness of breath. 09/03/21   Aline August, MD  ALPRAZolam Duanne Moron) 1 MG tablet Take 0.5-1.5 mg by mouth See admin instructions. Take 1.5 mg by mouth at bedtime and an additional 0.5 mg once a day as needed for anxiety 12/09/18   [provider]  amiodarone (PACERONE) 100 MG tablet Take 1 tablet (100 mg total) by mouth daily. 06/26/21   Deboraha Sprang, MD  anastrozole (ARIMIDEX) 1 MG tablet TAKE 1 TABLET BY MOUTH EVERY DAY Patient taking differently: Take 1 mg by mouth in the morning. 10/08/20   Magrinat, [name], MD  apixaban (ELIQUIS) 5 MG TABS tablet TAKE 1 TABLET BY MOUTH TWICE A DAY Patient taking differently: Take 5 mg by mouth 2 (two) times daily. 01/18/21   Deboraha Sprang, MD  Cholecalciferol (VITAMIN D-3) 25 MCG (1000 UT) CAPS Take 1,000-2,000 Units by mouth daily with breakfast.    [provider]  Choline Fenofibrate (FENOFIBRIC ACID) 135 MG CPDR Take 135 mg by mouth daily.  01/16/16   [provider]  docusate sodium (COLACE) 100 MG capsule Take 200 mg by mouth See admin instructions. Take 200 mg by mouth at bedtime, every other night- when not taking Metamucil capsules    [provider]  ferrous sulfate 325 (65 FE) MG tablet Take 325 mg by mouth at bedtime.    [provider]   levothyroxine (SYNTHROID, LEVOTHROID) 112 MCG tablet Take 112 mcg by mouth at bedtime.  03/13/16   [provider]  lidocaine-prilocaine (EMLA) cream Apply 1 Application topically as needed. Patient taking differently: Apply 1 Application topically as needed (FOR PORT ACCESS). 07/05/21   Wyatt Portela, MD  METAMUCIL 0.52 g capsule Take 3.64-4.16 g by mouth See admin instructions. Take 3.65-4.16 grams (7-8 capsules) by mouth at bedtime every other night, when not taking colace capsules    [provider]  pantoprazole (PROTONIX) 40 MG tablet Take 40 mg by mouth See admin instructions. Take 40 mg by mouth 30 minutes before breakfast and at bedtime 05/20/21   [provider]  prochlorperazine (COMPAZINE) 10 MG tablet Take 1 tablet (10 mg total) by mouth every 6 (six) hours as needed for nausea or vomiting. 07/05/21   Wyatt Portela, MD  traZODone (DESYREL) 50 MG tablet Take 1 tablet (50 mg total) by mouth at bedtime. 09/03/21   Aline August, MD    Physical Exam: Vitals:   09/12/21 1930 09/12/21 2015 09/12/21 2032 09/12/21 2123  BP: (!) 178/103 (!) 139/48 (!) 163/87 136/82  Pulse: (!) 108 (!) 102 (!) 103 (!) 106  Resp: _0 Temp:    99.6 F (37.6 C)  TempSrc:    Oral  SpO2: 94% 94% 94% 96%  Weight:       Constitutional: NAD, calm, comfortable, nontoxic well-appearing elderly female laying flat in bed Eyes: PERRL, lids and conjunctivae normal ENMT: Mucous membranes are moist.  Neck: normal, supple Respiratory: clear to auscultation bilaterally, no wheezing, no crackles. Normal respiratory effort. No accessory muscle use.  Cardiovascular: Regular rate and rhythm, no murmurs / rubs / gallops. No extremity edema. Breast:exam deferred Abdomen: no tenderness, . Bowel sounds positive.  Musculoskeletal: no clubbing / cyanosis. No joint deformity upper and lower extremities. Good ROM, no contractures. Normal muscle tone.  Skin: no rashes, lesions, ulcers. No  induration Neurologic: CN 2-12 grossly intact.  Strength 5/5 in all 4.  Psychiatric: Normal judgment and insight. Alert and oriented x 3. Normal mood. Data Reviewed:  See HPI  Assessment and Plan: * Acute respiratory failure with hypoxia ([place]) Secondary to recurrent right-sided pneumonia.  Patient recently discharged 2 weeks ago for multifocal pneumonia.  Likely recurrent due to her immunosuppression while on chemotherapy for bladder cancer. -Flu/COVID PCR negative. -Continue IV vancomycin and  cefepime -Maintain O2 >92%  Insomnia Continue trazodone and Xanax  History of breast cancer S/p right lumpectomy in 2018 -CTA chest shows a Interval increase in size and conspicuity of a soft tissue density within the right breast.  She reports having a recent mammogram but has not heard back from the results.  Last mammogram on record was on 08/2020.  Have advised her to reach out as soon as possible to find results of her recent mammogram or will need to have a repeat if she has not had one this year -Continue anastrozole  Anemia of chronic disease Stable with hemoglobin 10 which is around her baseline  Elevated troponin Elevated troponin at 13 and 31. Likely demand from a.fib RVR.  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 Last session on 8/24.  Initially had another session planned on 8/30 but this was canceled since she was hospitalized. -follows with Dr. Alen Blew oncology    Atrial fibrillation with RVR ([place]) Subsequently had RVR with heart rate up to 120 while being evaluated in the ED.  She was given fluid bolus and 15 mg of diltiazem with improvement in her rate. -Keep on continuous telemetry.  Continue home amiodarone. -Continue Eliquis  Hypothyroidism Continue levothyroxine  Diabetes type 2, controlled ([place]) Diet  controlled.  HTN (hypertension) Controlled.  Not on antihypertensives at home.      Advance Care Planning:   Code Status: DNR   Consults: None  Family Communication: No family at bedside  Severity of Illness: The appropriate patient status for this patient is INPATIENT. Inpatient status is judged to be reasonable and necessary in order to provide the required intensity of service to ensure the patient's safety. The patient's presenting symptoms, physical exam findings, and initial radiographic and laboratory data in the context of their chronic comorbidities is felt to place them at high risk for further clinical deterioration. Furthermore, it is not anticipated that the patient will be medically stable for discharge from the hospital within 2 midnights of admission.   * I certify that at the point of admission it is my clinical judgment that the patient will require inpatient hospital care spanning beyond 2 midnights from the point of admission due to high intensity of service, high risk for further deterioration and high frequency of surveillance required.*  Author: Orene Desanctis, DO 09/12/2021 10:53 PM  For on call review [email or web address].

## 2021-09-12 NOTE — Assessment & Plan Note (Addendum)
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 Last session on 8/24.  Initially had another session planned on 8/30 but this was canceled since she was hospitalized. -follows with Dr. Alen Blew oncology

## 2021-09-12 NOTE — Plan of Care (Signed)
  Problem: Education: Goal: Knowledge of General Education information will improve Description Including pain rating scale, medication(s)/side effects and non-pharmacologic comfort measures Outcome: Progressing   

## 2021-09-13 DIAGNOSIS — J9601 Acute respiratory failure with hypoxia: Secondary | ICD-10-CM | POA: Diagnosis not present

## 2021-09-13 LAB — CBC
HCT: 27.5 % — ABNORMAL LOW (ref 36.0–46.0)
Hemoglobin: 8.9 g/dL — ABNORMAL LOW (ref 12.0–15.0)
MCH: 32.6 pg (ref 26.0–34.0)
MCHC: 32.4 g/dL (ref 30.0–36.0)
MCV: 100.7 fL — ABNORMAL HIGH (ref 80.0–100.0)
Platelets: 152 10*3/uL (ref 150–400)
RBC: 2.73 MIL/uL — ABNORMAL LOW (ref 3.87–5.11)
RDW: 18.8 % — ABNORMAL HIGH (ref 11.5–15.5)
WBC: 10.2 10*3/uL (ref 4.0–10.5)
nRBC: 0 % (ref 0.0–0.2)

## 2021-09-13 LAB — MRSA NEXT GEN BY PCR, NASAL: MRSA by PCR Next Gen: NOT DETECTED

## 2021-09-13 MED ORDER — ANASTROZOLE 1 MG PO TABS
1.0000 mg | ORAL_TABLET | Freq: Every day | ORAL | Status: DC
  Administered 2021-09-13 – 2021-09-14 (×2): 1 mg via ORAL
  Filled 2021-09-13 (×2): qty 1

## 2021-09-13 MED ORDER — AMIODARONE HCL 100 MG PO TABS
100.0000 mg | ORAL_TABLET | Freq: Every day | ORAL | Status: DC
  Administered 2021-09-13: 100 mg via ORAL
  Filled 2021-09-13 (×2): qty 1

## 2021-09-13 MED ORDER — VITAMIN D 25 MCG (1000 UNIT) PO TABS
2000.0000 [IU] | ORAL_TABLET | Freq: Every day | ORAL | Status: DC
  Administered 2021-09-14: 2000 [IU] via ORAL
  Filled 2021-09-13: qty 2

## 2021-09-13 MED ORDER — ACETAMINOPHEN 325 MG PO TABS
650.0000 mg | ORAL_TABLET | Freq: Four times a day (QID) | ORAL | Status: DC | PRN
  Administered 2021-09-13 (×2): 650 mg via ORAL
  Filled 2021-09-13 (×2): qty 2

## 2021-09-13 NOTE — Progress Notes (Signed)
24 hour chart audit completed 

## 2021-09-13 NOTE — Progress Notes (Signed)
PROGRESS NOTE    Cynthia [name]  RDE:[id number] DOB: May 18, 1945 DOA: 09/12/2021 PCP: Saintclair Halsted, FNP     Brief Narrative:  Cynthia [name] is a 76 y.o. 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   She was recently hospitalized from 08/31/2021 - 09/03/2021 for acute hypoxic respiratory failure secondary to multifocal pneumonia.  RVP, strep pneumo urine antigen and COVID test was negative.  She was discharged home on 5 more days of ceftin.  Initially had 3 to 4 days where she felt great.  However today she noted increased fatigue which sometimes is her symptoms when she has trouble with her A-fib.  Also had increasing shortness of breath with increased cough.  Check BP at home was elevated with SBP up to 200.  Decided to present to outside ED.   In the ED, she was afebrile and initially hypoxic down to 89% requiring nasal cannula.  She also later developed atrial fibrillation with RVR with rates up to 120.  She was given fluid bolus and 15 mg of diltiazem with improvement in her heart rate.   Negative flu and COVID PCR. CTA chest showing patchy nodular-like airspace opacity of the right lung suggestive of inflammation/infection.  New events last 24 hours / Subjective: Patient reports feeling better this morning.  Her heart rate is improved.  Is on room air this morning.  Assessment & Plan:   Principal Problem:   Acute respiratory failure with hypoxia (HCC) Active Problems:   HTN (hypertension)   Diabetes type 2, controlled (HCC)   Hypothyroidism   Atrial fibrillation with RVR (HCC)   Urothelial cancer (HCC)   Elevated troponin   Anemia of chronic disease   HCAP (healthcare-associated pneumonia)   History of breast cancer   Insomnia   Acute hypoxemic respiratory failure secondary to  HCAP -MRSA PCR is negative, discontinue vancomycin -COVID, influenza, respiratory viral panel negative -Continue cefepime -Weaned to room air this morning  A-fib RVR -Improved -Eliquis, amiodarone  Hypothyroidism -Synthroid  Diabetes mellitus type 2 -Diet controlled  Urothelial cancer -Followed by Dr. Alen Blew -Follow-up outpatient, currently undergoing chemotherapy  History of breast cancer -Follow-up mammogram outpatient -Anastrozole  Anemia of chronic disease -Stable  Demand ischemia -Secondary to A-fib RVR  Insomnia -Trazodone, Xanax   DVT prophylaxis:  apixaban (ELIQUIS) tablet 5 mg Start: 09/12/21 2200 apixaban (ELIQUIS) tablet 5 mg  Code Status: DNR Family Communication: No family at bedside Disposition Plan:  Status is: Inpatient Remains inpatient appropriate because: IV antibiotics   Antimicrobials:  Anti-infectives (From admission, onward)    Start     Dose/Rate Route Frequency Ordered Stop   09/13/21 1700  ceFEPIme (MAXIPIME) 2 g in sodium chloride 0.9 % 100 mL IVPB  Status:  Discontinued        2 g 200 mL/hr over 30 Minutes Intravenous Every 12 hours 09/12/21 1637 09/12/21 1659   09/13/21 1700  vancomycin (VANCOCIN) IVPB 1000 mg/200 mL premix  Status:  Discontinued        1,000 mg 200 mL/hr over 60 Minutes Intravenous Every 24 hours 09/12/21 2241 09/13/21 0911   09/12/21 1800  ceFEPIme (MAXIPIME) 2 g in sodium chloride 0.9 % 100 mL IVPB        2 g 200 mL/hr over 30 Minutes Intravenous Every 12 hours 09/12/21 1659     09/12/21 1630  vancomycin (VANCOCIN) IVPB  1000 mg/200 mL premix        1,000 mg 200 mL/hr over 60 Minutes Intravenous  Once 09/12/21 1616 09/12/21 1859   09/12/21 1600  ceFEPIme (MAXIPIME) 1 g in sodium chloride 0.9 % 100 mL IVPB  Status:  Discontinued        1 g 200 mL/hr over 30 Minutes Intravenous  Once 09/12/21 1556 09/12/21 1637        Objective: Vitals:   09/12/21 2032 09/12/21 2123 09/13/21 0143 09/13/21 0535  BP:  (!) 163/87 136/82 (!) 98/50 (!) 108/55  Pulse: (!) 103 (!) 106 63 61  Resp: '20 16 16 16  '$ Temp:  99.6 F (37.6 C) 98.8 F (37.1 C) 98.5 F (36.9 C)  TempSrc:  Oral Oral Oral  SpO2: 94% 96% 93% 94%  Weight:        Intake/Output Summary (Last 24 hours) at 09/13/2021 1117 Last data filed at 09/13/2021 1000 Gross per 24 hour  Intake 841.53 ml  Output 0 ml  Net 841.53 ml   Filed Weights   09/12/21 1406  Weight: 74.8 kg    Examination:  General exam: Appears calm and comfortable  Respiratory system: Clear to auscultation. Respiratory effort normal. No respiratory distress. No conversational dyspnea.  On room air Cardiovascular system: S1 & S2 heard, irregular rhythm, normal rate. No murmurs. No pedal edema. Gastrointestinal system: Abdomen is nondistended, soft and nontender. Normal bowel sounds heard. Central nervous system: Alert and oriented. No focal neurological deficits. Speech clear.  Extremities: Symmetric in appearance  Skin: No rashes, lesions or ulcers on exposed skin  Psychiatry: Judgement and insight appear normal. Mood & affect appropriate.   Data Reviewed: I have personally reviewed following labs and imaging studies  CBC: Recent Labs  Lab 09/12/21 1449 09/13/21 0455  WBC 12.3* 10.2  HGB 10.2* 8.9*  HCT 31.3* 27.5*  MCV 99.4 100.7*  PLT 157 983   Basic Metabolic Panel: Recent Labs  Lab 09/12/21 1449  NA 139  K 3.9  CL 101  CO2 27  GLUCOSE 91  BUN 11  CREATININE 0.92  CALCIUM 9.4   GFR: Estimated Creatinine Clearance: 51 mL/min (by C-G formula based on SCr of 0.92 mg/dL). Liver Function Tests: No results for input(s): "AST", "ALT", "ALKPHOS", "BILITOT", "PROT", "ALBUMIN" in the last 168 hours. No results for input(s): "LIPASE", "AMYLASE" in the last 168 hours. No results for input(s): "AMMONIA" in the last 168 hours. Coagulation Profile: No results for input(s): "INR", "PROTIME" in the last 168 hours. Cardiac Enzymes: No results for input(s):  "CKTOTAL", "CKMB", "CKMBINDEX", "TROPONINI" in the last 168 hours. BNP (last 3 results) No results for input(s): "PROBNP" in the last 8760 hours. HbA1C: No results for input(s): "HGBA1C" in the last 72 hours. CBG: No results for input(s): "GLUCAP" in the last 168 hours. Lipid Profile: No results for input(s): "CHOL", "HDL", "LDLCALC", "TRIG", "CHOLHDL", "LDLDIRECT" in the last 72 hours. Thyroid Function Tests: No results for input(s): "TSH", "T4TOTAL", "FREET4", "T3FREE", "THYROIDAB" in the last 72 hours. Anemia Panel: No results for input(s): "VITAMINB12", "FOLATE", "FERRITIN", "TIBC", "IRON", "RETICCTPCT" in the last 72 hours. Sepsis Labs: Recent Labs  Lab 09/12/21 1438 09/12/21 1708  LATICACIDVEN 1.0 1.3    Recent Results (from the past 240 hour(s))  Resp Panel by RT-PCR (Flu A&B, Covid) Anterior Nasal Swab     Status: None   Collection Time: 09/12/21  2:38 PM   Specimen: Anterior Nasal Swab  Result Value Ref Range Status   SARS Coronavirus 2 by  RT PCR NEGATIVE NEGATIVE Final    Comment: (NOTE) SARS-CoV-2 target nucleic acids are NOT DETECTED.  The SARS-CoV-2 RNA is generally detectable in upper respiratory specimens during the acute phase of infection. The lowest concentration of SARS-CoV-2 viral copies this assay can detect is 138 copies/mL. A negative result does not preclude SARS-Cov-2 infection and should not be used as the sole basis for treatment or other patient management decisions. A negative result may occur with  improper specimen collection/handling, submission of specimen other than nasopharyngeal swab, presence of viral mutation(s) within the areas targeted by this assay, and inadequate number of viral copies(<138 copies/mL). A negative result must be combined with clinical observations, patient history, and epidemiological information. The expected result is Negative.  Fact Sheet for Patients:  EntrepreneurPulse.com.au  Fact Sheet for  Healthcare Providers:  IncredibleEmployment.be  This test is no t yet approved or cleared by the [place] FDA and  has been authorized for detection and/or diagnosis of SARS-CoV-2 by FDA under an Emergency Use Authorization (EUA). This EUA will remain  in effect (meaning this test can be used) for the duration of the COVID-19 declaration under Section 564(b)(1) of the Act, 21 U.S.C.section 360bbb-3(b)(1), unless the authorization is terminated  or revoked sooner.       Influenza A by PCR NEGATIVE NEGATIVE Final   Influenza B by PCR NEGATIVE NEGATIVE Final    Comment: (NOTE) The Xpert Xpress SARS-CoV-2/FLU/RSV plus assay is intended as an aid in the diagnosis of influenza from Nasopharyngeal swab specimens and should not be used as a sole basis for treatment. Nasal washings and aspirates are unacceptable for Xpert Xpress SARS-CoV-2/FLU/RSV testing.  Fact Sheet for Patients: EntrepreneurPulse.com.au  Fact Sheet for Healthcare Providers: IncredibleEmployment.be  This test is not yet approved or cleared by the [place] FDA and has been authorized for detection and/or diagnosis of SARS-CoV-2 by FDA under an Emergency Use Authorization (EUA). This EUA will remain in effect (meaning this test can be used) for the duration of the COVID-19 declaration under Section 564(b)(1) of the Act, 21 U.S.C. section 360bbb-3(b)(1), unless the authorization is terminated or revoked.  Performed at [facility], [address] [place], [place] [address]   MRSA Next Gen by PCR, Nasal     Status: None   Collection Time: 09/13/21  3:00 AM   Specimen: Nasal Mucosa; Nasal Swab  Result Value Ref Range Status   MRSA by PCR Next Gen NOT DETECTED NOT DETECTED Final    Comment: (NOTE) The GeneXpert MRSA Assay (FDA approved for NASAL specimens only), is one component of a comprehensive MRSA colonization  surveillance program. It is not intended to diagnose MRSA infection nor to guide or monitor treatment for MRSA infections. Test performance is not FDA approved in patients less than 25 years old. Performed at [facility], [place] [address] [place], [place] [address]       Radiology Studies: CT Angio Chest PE W and/or Wo Contrast  Result Date: 09/12/2021 CLINICAL DATA:  Pulmonary embolism (PE) suspected, positive D-dimer. Pt is here for shortness of breath for 2 days as well as a lack of energy. Pt was hospitalized for pneumonia recently (in the last 2 weeks) for pneumonia at [facility] for 3 days. Pt states that she has hx of afib and exerted EXAM: CT ANGIOGRAPHY CHEST WITH CONTRAST TECHNIQUE: Multidetector CT imaging of the chest was performed using the standard protocol during bolus administration of intravenous contrast. Multiplanar CT image reconstructions and MIPs were  obtained to evaluate the vascular anatomy. RADIATION DOSE REDUCTION: This exam was performed according to the departmental dose-optimization program which includes automated exposure control, adjustment of the mA and/or kV according to patient size and/or use of iterative reconstruction technique. CONTRAST:  51m OMNIPAQUE IOHEXOL 350 MG/ML SOLN COMPARISON:  PET CT 07/29/2021 FINDINGS: Cardiovascular: Right chest wall Port-A-Cath with tip terminating at the superior cavoatrial junction. Satisfactory opacification of the pulmonary arteries to the segmental level. No evidence of pulmonary embolism. The main pulmonary artery measures borderline enlarged. Normal heart size. No significant pericardial effusion. The thoracic aorta is normal in caliber. Mild atherosclerotic plaque of the thoracic aorta. Four-vessel coronary artery calcifications. Mediastinum/Nodes: Borderline enlarged 1.3 cm right hilar lymph node. Prominent but nonenlarged left hilar lymph node. No enlarged mediastinal, left hilar, or axillary lymph  nodes. Thyroid gland, trachea, and esophagus demonstrate no significant findings. Lungs/Pleura: Patchy nodular-like airspace opacities within the right lung. No left pulmonary nodule. No pulmonary mass. Trace right pleural effusion. No left pleural effusion. No pneumothorax. Upper Abdomen: Status post cholecystectomy. Punctate calcification within the spleen sequela prior granulomatous disease. Otherwise no acute abnormality. Musculoskeletal: Right breast dermal thickening. Surgical clips overlie interval increase in size and conspicuity of a 2.2 x 1.2 cm (from 1.3 x 0.9 cm) soft tissue density within the right breast. No suspicious lytic or blastic osseous lesions. No acute displaced fracture. Multilevel degenerative changes of the spine. Review of the MIP images confirms the above findings. IMPRESSION: 1. Patchy nodular-like airspace opacities of the right lung suggestive of infection/inflammation. Recommend follow-up in 3 months to evaluate for resolution. 2. Trace right pleural effusion. 3. Borderline enlarged right hilar lymph node likely reactive in etiology. Recommend attention on follow-up. 4. Right breast dermal thickening. Surgical clips overlie interval increase in size and conspicuity of a 2.2 x 1.2 cm (from 1.3 x 0.9 cm) soft tissue density within the right breast. Correlate with physical exam. If clinically indicated, consider diagnostic mammographic evaluation. Electronically Signed   By: MIven FinnM.D.   On: 09/12/2021 18:34   DG Chest 2 View  Result Date: 09/12/2021 CLINICAL DATA:  Shortness of breath, cough. Recent diagnosis of pneumonia. EXAM: CHEST - 2 VIEW COMPARISON:  August 31, 2021 FINDINGS: EKG leads project over the chest. RIGHT-sided Port-A-Cath tip at the caval to atrial junction. Cardiac loop recorder projects over the chest. Patchy opacities with vaguely nodular appearance persist in the RIGHT chest. Increased perihilar airspace disease adjacent to and inferior to the RIGHT  hilum. Cardiomediastinal contours and hilar structures with top-normal heart size. Mild increased interstitial markings perhaps with Kerley B lines. No lobar consolidation. No sign of pleural effusion. On limited assessment no acute skeletal findings. IMPRESSION: 1. Patchy opacities with vaguely nodular appearance in the RIGHT chest. Increased perihilar airspace disease adjacent to and inferior to the RIGHT hilum. While findings could reflect infection, in a patient with history of bladder neoplasm would suggest either close follow-up PA and lateral chest after therapy or chest CT for further evaluation. 2. Query background mild interstitial edema. Electronically Signed   By: GZetta BillsM.D.   On: 09/12/2021 14:38      Scheduled Meds:  amiodarone  100 mg Oral Daily   anastrozole  1 mg Oral Daily   apixaban  5 mg Oral BID   fenofibrate  160 mg Oral Daily   ferrous sulfate  325 mg Oral QHS   levothyroxine  112 mcg Oral QHS   pantoprazole (PROTONIX) IV  40 mg Intravenous Q12H  traZODone  50 mg Oral QHS   Vitamin D-3  1,000-2,000 Units Oral Q breakfast   Continuous Infusions:  ceFEPime (MAXIPIME) IV 2 g (09/13/21 0850)     LOS: 1 day     Dessa Phi, DO [facility] 09/13/2021, 11:17 AM   Available via Epic secure chat 7am-7pm After these hours, please refer to coverage provider listed on [email or web address]

## 2021-09-14 DIAGNOSIS — J9601 Acute respiratory failure with hypoxia: Secondary | ICD-10-CM | POA: Diagnosis not present

## 2021-09-14 LAB — BASIC METABOLIC PANEL
Anion gap: 6 (ref 5–15)
BUN: 13 mg/dL (ref 8–23)
CO2: 26 mmol/L (ref 22–32)
Calcium: 9 mg/dL (ref 8.9–10.3)
Chloride: 108 mmol/L (ref 98–111)
Creatinine, Ser: 0.86 mg/dL (ref 0.44–1.00)
GFR, Estimated: >60 mL/min (ref >60)
Glucose, Bld: 109 mg/dL — ABNORMAL HIGH (ref 70–99)
Potassium: 3.7 mmol/L (ref 3.5–5.1)
Sodium: 140 mmol/L (ref 135–145)

## 2021-09-14 LAB — MAGNESIUM: Magnesium: 2.1 mg/dL (ref 1.7–2.4)

## 2021-09-14 MED ORDER — PANTOPRAZOLE SODIUM 40 MG PO TBEC
40.0000 mg | DELAYED_RELEASE_TABLET | Freq: Two times a day (BID) | ORAL | Status: DC
  Administered 2021-09-14: 40 mg via ORAL
  Filled 2021-09-14: qty 1

## 2021-09-14 MED ORDER — POTASSIUM CHLORIDE 10 MEQ/100ML IV SOLN
10.0000 meq | Freq: Once | INTRAVENOUS | Status: AC
  Administered 2021-09-14: 10 meq via INTRAVENOUS
  Filled 2021-09-14: qty 100

## 2021-09-14 MED ORDER — FLUCONAZOLE 150 MG PO TABS: 150.0000 mg | ORAL_TABLET | Freq: Once | ORAL | 0 refills | Status: DC | PRN

## 2021-09-14 MED ORDER — AMIODARONE HCL 200 MG PO TABS
200.0000 mg | ORAL_TABLET | Freq: Every day | ORAL | Status: DC
  Administered 2021-09-14: 200 mg via ORAL
  Filled 2021-09-14: qty 1

## 2021-09-14 MED ORDER — POTASSIUM CHLORIDE CRYS ER 20 MEQ PO TBCR
30.0000 meq | EXTENDED_RELEASE_TABLET | Freq: Once | ORAL | Status: AC
  Administered 2021-09-14: 30 meq via ORAL
  Filled 2021-09-14: qty 1

## 2021-09-14 MED ORDER — MAGNESIUM SULFATE IN D5W 1-5 GM/100ML-% IV SOLN
1.0000 g | Freq: Once | INTRAVENOUS | Status: AC
  Administered 2021-09-14: 1 g via INTRAVENOUS
  Filled 2021-09-14: qty 100

## 2021-09-14 MED ORDER — AMIODARONE HCL 100 MG PO TABS: 200.0000 mg | ORAL_TABLET | Freq: Every day | ORAL | 3 refills | Status: DC

## 2021-09-14 MED ORDER — CEFDINIR 300 MG PO CAPS: 300.0000 mg | ORAL_CAPSULE | Freq: Two times a day (BID) | ORAL | 0 refills | Status: DC

## 2021-09-14 NOTE — Plan of Care (Signed)

## 2021-09-14 NOTE — Progress Notes (Signed)
Assessment unchanged. Pt verbalized understanding of dc instructions including medications and follow up care.discharged via wc to front entrance by NT.

## 2021-09-14 NOTE — TOC Transition Note (Signed)
Transition of Care [facility]) - CM/SW Discharge Note   Patient Details  Name: Cynthia [name] MRN: [id number] Date of Birth: 04/27/1945  [facility] CM/SW Contact:  Kimber Relic, LCSW Phone Number: 09/14/2021, 12:01 PM   Clinical Narrative:   Pt is to discharge back to her home. Son, Randall Hiss, will transport pt from the hospital to the home. Discharge Summary will be provided with pt.     Final next level of care: Home/Self Care Barriers to Discharge: No Barriers Identified   Patient Goals and CMS Choice Patient states their goals for this hospitalization and ongoing recovery are:: Return home   Choice offered to / list presented to : NA  Discharge Placement                  Name of family member notified: Son - Randall Hiss Patient and family notified of of transfer: 09/14/21  Discharge Plan and Services                                     Social Determinants of Health (SDOH) Interventions     Readmission Risk Interventions    09/03/2021   11:36 AM  Readmission Risk Prevention Plan  PCP or Specialist Appt within 3-5 Days Complete  HRI or [place] Complete  Social Work Consult for [place] Planning/Counseling Complete  Palliative Care Screening Complete  Medication Review [employer] photographer) Complete

## 2021-09-14 NOTE — Progress Notes (Signed)
MD Opyd was notified that patient was in [place] and having several PVCs. Patient is sitting in recliner, she is unable to sleep because she is stressed about her husband being home alone due his health.

## 2021-09-14 NOTE — Discharge Summary (Signed)
Physician Discharge Summary  Matsue Strom PJK:[id number] DOB: August 30, 1945 DOA: 09/12/2021  PCP: Saintclair Halsted, FNP  Admit date: 09/12/2021 Discharge date: 09/14/2021  Admitted From: Home Disposition:  Home  Recommendations for Outpatient Follow-up:  Follow up with PCP in 1 week Follow up with Cardiology Follow up with Oncology 9/13   Discharge Condition: Stable CODE STATUS: DNR  Diet recommendation: Heart healthy   Brief/Interim Summary: Charlissa Petros is a 76 y.o. 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   She was recently hospitalized from 08/31/2021 - 09/03/2021 for acute hypoxic respiratory failure secondary to multifocal pneumonia.  RVP, strep pneumo urine antigen and COVID test was negative.  She was discharged home on 5 more days of ceftin.  Initially had 3 to 4 days where she felt great.  However today she noted increased fatigue which sometimes is her symptoms when she has trouble with her A-fib.  Also had increasing shortness of breath with increased cough.  Check BP at home was elevated with SBP up to 200.  Decided to present to outside ED.   In the ED, she was afebrile and initially hypoxic down to 89% requiring nasal cannula.  She also later developed atrial fibrillation with RVR with rates up to 120.  She was given fluid bolus and 15 mg of diltiazem with improvement in her heart rate.   Negative flu and COVID PCR. CTA chest showing patchy nodular-like airspace opacity of the right lung suggestive of inflammation/infection. She was started on IV antibiotics to cover HCAP.  She remained in paroxysmal A-fib, rate controlled, had some PVCs and nonsustained VT.  Amiodarone dosing was increased prior to discharge.  Discharge Diagnoses:   Principal Problem:   Acute respiratory failure with  hypoxia (HCC) Active Problems:   HTN (hypertension)   Diabetes type 2, controlled (HCC)   Hypothyroidism   Atrial fibrillation with RVR (HCC)   Urothelial cancer (HCC)   Wide-complex tachycardia - fasicular VT   Paroxysmal atrial fibrillation (HCC)   Elevated troponin   Anemia of chronic disease   HCAP (healthcare-associated pneumonia)   History of breast cancer   Insomnia   Acute hypoxemic respiratory failure (HCC)  Acute hypoxemic respiratory failure secondary to HCAP -MRSA PCR is negative, discontinue vancomycin -COVID, influenza, respiratory viral panel negative -Continue cefepime --> Omnicef for discharge -Now on room air    A-fib RVR, NSVT -Improved -Eliquis, amiodarone --> increase to '200mg'$  daily  -Follow up with Cardiology    Hypothyroidism -Synthroid   Diabetes mellitus type 2 -Diet controlled   Urothelial cancer -Followed by Dr. Alen Blew -Follow-up outpatient, currently undergoing chemotherapy   History of breast cancer -Follow-up mammogram outpatient -Anastrozole   Anemia of chronic disease -Stable   Demand ischemia -Secondary to A-fib RVR   Insomnia -Trazodone, Xanax  Discharge Instructions  Discharge Instructions     Call MD for:  difficulty breathing, headache or visual disturbances   Complete by: As directed    Call MD for:  extreme fatigue   Complete by: As directed    Call MD for:  hives   Complete by: As directed    Call MD for:  persistant dizziness or light-headedness   Complete by: As directed    Call MD for:  persistant nausea and vomiting   Complete by: As directed    Call MD for:  severe uncontrolled pain  Complete by: As directed    Call MD for:  temperature >100.4   Complete by: As directed    Diet - low sodium heart healthy   Complete by: As directed    Discharge instructions   Complete by: As directed    You were cared for by a hospitalist during your hospital stay. If you have any questions about your discharge  medications or the care you received while you were in the hospital after you are discharged, you can call the unit and ask to speak with the hospitalist on call if the hospitalist that took care of you is not available. Once you are discharged, your primary care physician will handle any further medical issues. Please note that NO REFILLS for any discharge medications will be authorized once you are discharged, as it is imperative that you return to your primary care physician (or establish a relationship with a primary care physician if you do not have one) for your aftercare needs so that they can reassess your need for medications and monitor your lab values.   Increase activity slowly   Complete by: As directed       Allergies as of 09/14/2021   No Known Allergies      Medication List     STOP taking these medications    Metamucil 0.52 g capsule Generic drug: psyllium   mirtazapine 15 MG tablet Commonly known as: REMERON       TAKE these medications    acetaminophen 500 MG tablet Commonly known as: TYLENOL Take 1,000 mg by mouth at bedtime. May take an additional 1000 mg by mouth for headaches as needed   albuterol 108 (90 Base) MCG/ACT inhaler Commonly known as: VENTOLIN HFA Inhale 2 puffs into the lungs every 4 (four) hours as needed for wheezing or shortness of breath.   ALPRAZolam 1 MG tablet Commonly known as: XANAX Take 1 mg by mouth at bedtime.   amiodarone 100 MG tablet Commonly known as: Pacerone Take 2 tablets (200 mg total) by mouth daily. What changed: how much to take   anastrozole 1 MG tablet Commonly known as: ARIMIDEX TAKE 1 TABLET BY MOUTH EVERY DAY What changed: when to take this   cefdinir 300 MG capsule Commonly known as: OMNICEF Take 1 capsule (300 mg total) by mouth 2 (two) times daily.   docusate sodium 100 MG capsule Commonly known as: COLACE Take 200 mg by mouth as needed for mild constipation.   Eliquis 5 MG Tabs tablet Generic  drug: apixaban TAKE 1 TABLET BY MOUTH TWICE A DAY What changed: how much to take   Fenofibric Acid 135 MG Cpdr Take 135 mg by mouth daily.   ferrous sulfate 325 (65 FE) MG tablet Take 325 mg by mouth at bedtime.   FIBER PO Take 8 tablets by mouth daily.   fluconazole 150 MG tablet Commonly known as: DIFLUCAN Take 1 tablet (150 mg total) by mouth once as needed for up to 1 dose (yeast infection). What changed:  how much to take when to take this reasons to take this   levothyroxine 112 MCG tablet Commonly known as: SYNTHROID Take 112 mcg by mouth daily.   lidocaine-prilocaine cream Commonly known as: EMLA Apply 1 Application topically as needed. What changed: reasons to take this   pantoprazole 40 MG tablet Commonly known as: PROTONIX Take 40 mg by mouth 2 (two) times daily.   prochlorperazine 10 MG tablet Commonly known as: COMPAZINE Take 1 tablet (10 mg  total) by mouth every 6 (six) hours as needed for nausea or vomiting. What changed: when to take this   traZODone 50 MG tablet Commonly known as: DESYREL Take 1 tablet (50 mg total) by mouth at bedtime.   Vitamin D-3 25 MCG (1000 UT) Caps Take 1,000 Units by mouth daily with breakfast.        Follow-up Information     Saintclair Halsted, FNP Follow up.   Specialty: Family Medicine Contact information: [place] [address] [phone]         Deboraha Sprang, MD. Schedule an appointment as soon as possible for a visit in 1 week(s).   Specialty: Cardiology Contact information: [address] [place] [address] [phone]         Wyatt Portela, MD. Go on 09/18/2021.   Specialty: Oncology Contact information: [place] [address] [phone]                No Known Allergies  Consultations: None    Procedures/Studies: CT Angio Chest PE W and/or Wo Contrast  Result Date: 09/12/2021 CLINICAL DATA:  Pulmonary  embolism (PE) suspected, positive D-dimer. Pt is here for shortness of breath for 2 days as well as a lack of energy. Pt was hospitalized for pneumonia recently (in the last 2 weeks) for pneumonia at [facility] long for 3 days. Pt states that she has hx of afib and exerted EXAM: CT ANGIOGRAPHY CHEST WITH CONTRAST TECHNIQUE: Multidetector CT imaging of the chest was performed using the standard protocol during bolus administration of intravenous contrast. Multiplanar CT image reconstructions and MIPs were obtained to evaluate the vascular anatomy. RADIATION DOSE REDUCTION: This exam was performed according to the departmental dose-optimization program which includes automated exposure control, adjustment of the mA and/or kV according to patient size and/or use of iterative reconstruction technique. CONTRAST:  61m OMNIPAQUE IOHEXOL 350 MG/ML SOLN COMPARISON:  PET CT 07/29/2021 FINDINGS: Cardiovascular: Right chest wall Port-A-Cath with tip terminating at the superior cavoatrial junction. Satisfactory opacification of the pulmonary arteries to the segmental level. No evidence of pulmonary embolism. The main pulmonary artery measures borderline enlarged. Normal heart size. No significant pericardial effusion. The thoracic aorta is normal in caliber. Mild atherosclerotic plaque of the thoracic aorta. Four-vessel coronary artery calcifications. Mediastinum/Nodes: Borderline enlarged 1.3 cm right hilar lymph node. Prominent but nonenlarged left hilar lymph node. No enlarged mediastinal, left hilar, or axillary lymph nodes. Thyroid gland, trachea, and esophagus demonstrate no significant findings. Lungs/Pleura: Patchy nodular-like airspace opacities within the right lung. No left pulmonary nodule. No pulmonary mass. Trace right pleural effusion. No left pleural effusion. No pneumothorax. Upper Abdomen: Status post cholecystectomy. Punctate calcification within the spleen sequela prior granulomatous disease. Otherwise no acute  abnormality. Musculoskeletal: Right breast dermal thickening. Surgical clips overlie interval increase in size and conspicuity of a 2.2 x 1.2 cm (from 1.3 x 0.9 cm) soft tissue density within the right breast. No suspicious lytic or blastic osseous lesions. No acute displaced fracture. Multilevel degenerative changes of the spine. Review of the MIP images confirms the above findings. IMPRESSION: 1. Patchy nodular-like airspace opacities of the right lung suggestive of infection/inflammation. Recommend follow-up in 3 months to evaluate for resolution. 2. Trace right pleural effusion. 3. Borderline enlarged right hilar lymph node likely reactive in etiology. Recommend attention on follow-up. 4. Right breast dermal thickening. Surgical clips overlie interval increase in size and conspicuity of a 2.2 x 1.2 cm (from 1.3  x 0.9 cm) soft tissue density within the right breast. Correlate with physical exam. If clinically indicated, consider diagnostic mammographic evaluation. Electronically Signed   By: Iven Finn M.D.   On: 09/12/2021 18:34   DG Chest 2 View  Result Date: 09/12/2021 CLINICAL DATA:  Shortness of breath, cough. Recent diagnosis of pneumonia. EXAM: CHEST - 2 VIEW COMPARISON:  August 31, 2021 FINDINGS: EKG leads project over the chest. RIGHT-sided Port-A-Cath tip at the caval to atrial junction. Cardiac loop recorder projects over the chest. Patchy opacities with vaguely nodular appearance persist in the RIGHT chest. Increased perihilar airspace disease adjacent to and inferior to the RIGHT hilum. Cardiomediastinal contours and hilar structures with top-normal heart size. Mild increased interstitial markings perhaps with Kerley B lines. No lobar consolidation. No sign of pleural effusion. On limited assessment no acute skeletal findings. IMPRESSION: 1. Patchy opacities with vaguely nodular appearance in the RIGHT chest. Increased perihilar airspace disease adjacent to and inferior to the RIGHT hilum.  While findings could reflect infection, in a patient with history of bladder neoplasm would suggest either close follow-up PA and lateral chest after therapy or chest CT for further evaluation. 2. Query background mild interstitial edema. Electronically Signed   By: Zetta Bills M.D.   On: 09/12/2021 14:38   CUP PACEART REMOTE DEVICE CHECK  Result Date: 09/11/2021 ILR summary report received. Battery status OK. Normal device function. No new symptom, tachy, brady, or pause episodes. Monthly summary reports and ROV/PRN AF episodes/compass up to 12hrs in duration, burden 13.4%, Eliquis, Amiodarone.  No EGM's for review. LA  DG Chest Port 1 View  Result Date: 08/31/2021 CLINICAL DATA:  Questionable sepsis. EXAM: PORTABLE CHEST 1 VIEW COMPARISON:  August 19, 2021 FINDINGS: New infiltrate in the right mid and lower lung. Mild haziness in the left base. Stable cardiomegaly. The hila and mediastinum are unremarkable. The right Port-A-Cath is stable. No pneumothorax. No other acute abnormalities. IMPRESSION: New right mid and lower lung infiltrate. Mild haziness in the left base is nonspecific but early opacity not excluded. The findings are concerning for multifocal pneumonia given history. Electronically Signed   By: Dorise Bullion III M.D.   On: 08/31/2021 14:00   DG Chest 2 View  Result Date: 08/19/2021 CLINICAL DATA:  Fever workup in cancer patient EXAM: CHEST - 2 VIEW COMPARISON:  Radiographs 04/18/2021 FINDINGS: No focal consolidation, pleural effusion, or pneumothorax. Stable mild cardiomegaly. No acute osseous abnormality. Aortic calcification. Right chest wall Port-A-Cath tip in the low SVC. Loop recorder. Surgical clips right breast and axilla. IMPRESSION: No active cardiopulmonary disease.  Stable mild cardiomegaly. Electronically Signed   By: Placido Sou M.D.   On: 08/19/2021 21:12       Discharge Exam: Vitals:   09/13/21 2138 09/14/21 0603  BP: (!) 152/88 (!) 156/75  Pulse: 85 65   Resp: 18 18  Temp: 98.6 F (37 C) 98.4 F (36.9 C)  SpO2: 98% 95%    General: Pt is alert, awake, not in acute distress Cardiovascular: Irreg rhythm, rate 80s, S1/S2 +, no edema Respiratory: CTA bilaterally, no wheezing, no rhonchi, no respiratory distress, no conversational dyspnea, on room air  Abdominal: Soft, NT, ND, bowel sounds + Extremities: no edema, no cyanosis Psych: Normal mood and affect, stable judgement and insight     The results of significant diagnostics from this hospitalization (including imaging, microbiology, ancillary and laboratory) are listed below for reference.     Microbiology: Recent Results (from the past 240 hour(s))  Resp  Panel by RT-PCR (Flu A&B, Covid) Anterior Nasal Swab     Status: None   Collection Time: 09/12/21  2:38 PM   Specimen: Anterior Nasal Swab  Result Value Ref Range Status   SARS Coronavirus 2 by RT PCR NEGATIVE NEGATIVE Final    Comment: (NOTE) SARS-CoV-2 target nucleic acids are NOT DETECTED.  The SARS-CoV-2 RNA is generally detectable in upper respiratory specimens during the acute phase of infection. The lowest concentration of SARS-CoV-2 viral copies this assay can detect is 138 copies/mL. A negative result does not preclude SARS-Cov-2 infection and should not be used as the sole basis for treatment or other patient management decisions. A negative result may occur with  improper specimen collection/handling, submission of specimen other than nasopharyngeal swab, presence of viral mutation(s) within the areas targeted by this assay, and inadequate number of viral copies(<138 copies/mL). A negative result must be combined with clinical observations, patient history, and epidemiological information. The expected result is Negative.  Fact Sheet for Patients:  EntrepreneurPulse.com.au  Fact Sheet for Healthcare Providers:  IncredibleEmployment.be  This test is no t yet approved or  cleared by the [place] FDA and  has been authorized for detection and/or diagnosis of SARS-CoV-2 by FDA under an Emergency Use Authorization (EUA). This EUA will remain  in effect (meaning this test can be used) for the duration of the COVID-19 declaration under Section 564(b)(1) of the Act, 21 U.S.C.section 360bbb-3(b)(1), unless the authorization is terminated  or revoked sooner.       Influenza A by PCR NEGATIVE NEGATIVE Final   Influenza B by PCR NEGATIVE NEGATIVE Final    Comment: (NOTE) The Xpert Xpress SARS-CoV-2/FLU/RSV plus assay is intended as an aid in the diagnosis of influenza from Nasopharyngeal swab specimens and should not be used as a sole basis for treatment. Nasal washings and aspirates are unacceptable for Xpert Xpress SARS-CoV-2/FLU/RSV testing.  Fact Sheet for Patients: EntrepreneurPulse.com.au  Fact Sheet for Healthcare Providers: IncredibleEmployment.be  This test is not yet approved or cleared by the [place] FDA and has been authorized for detection and/or diagnosis of SARS-CoV-2 by FDA under an Emergency Use Authorization (EUA). This EUA will remain in effect (meaning this test can be used) for the duration of the COVID-19 declaration under Section 564(b)(1) of the Act, 21 U.S.C. section 360bbb-3(b)(1), unless the authorization is terminated or revoked.  Performed at [facility], [address] [place], [place] [address]   MRSA Next Gen by PCR, Nasal     Status: None   Collection Time: 09/13/21  3:00 AM   Specimen: Nasal Mucosa; Nasal Swab  Result Value Ref Range Status   MRSA by PCR Next Gen NOT DETECTED NOT DETECTED Final    Comment: (NOTE) The GeneXpert MRSA Assay (FDA approved for NASAL specimens only), is one component of a comprehensive MRSA colonization surveillance program. It is not intended to diagnose MRSA infection nor to guide or monitor treatment for MRSA  infections. Test performance is not FDA approved in patients less than 57 years old. Performed at [facility], [place] [address] [place], [place] [address]      Labs: BNP (last 3 results) No results for input(s): "BNP" in the last 8760 hours. Basic Metabolic Panel: Recent Labs  Lab 09/12/21 1449  NA 139  K 3.9  CL 101  CO2 27  GLUCOSE 91  BUN 11  CREATININE 0.92  CALCIUM 9.4   Liver Function Tests: No results for input(s): "AST", "ALT", "ALKPHOS", "BILITOT", "  PROT", "ALBUMIN" in the last 168 hours. No results for input(s): "LIPASE", "AMYLASE" in the last 168 hours. No results for input(s): "AMMONIA" in the last 168 hours. CBC: Recent Labs  Lab 09/12/21 1449 09/13/21 0455  WBC 12.3* 10.2  HGB 10.2* 8.9*  HCT 31.3* 27.5*  MCV 99.4 100.7*  PLT 157 152   Cardiac Enzymes: No results for input(s): "CKTOTAL", "CKMB", "CKMBINDEX", "TROPONINI" in the last 168 hours. BNP: Invalid input(s): "POCBNP" CBG: No results for input(s): "GLUCAP" in the last 168 hours. D-Dimer Recent Labs    09/12/21 1449  DDIMER 0.93*   Hgb A1c No results for input(s): "HGBA1C" in the last 72 hours. Lipid Profile No results for input(s): "CHOL", "HDL", "LDLCALC", "TRIG", "CHOLHDL", "LDLDIRECT" in the last 72 hours. Thyroid function studies No results for input(s): "TSH", "T4TOTAL", "T3FREE", "THYROIDAB" in the last 72 hours.  Invalid input(s): "FREET3" Anemia work up No results for input(s): "VITAMINB12", "FOLATE", "FERRITIN", "TIBC", "IRON", "RETICCTPCT" in the last 72 hours. Urinalysis    Component Value Date/Time   COLORURINE YELLOW 08/31/2021 1321   APPEARANCEUR CLEAR 08/31/2021 1321   LABSPEC 1.018 08/31/2021 1321   PHURINE 5.0 08/31/2021 1321   GLUCOSEU NEGATIVE 08/31/2021 1321   HGBUR NEGATIVE 08/31/2021 1321   [place] 08/31/2021 1321   KETONESUR NEGATIVE 08/31/2021 1321   PROTEINUR NEGATIVE 08/31/2021 1321   NITRITE NEGATIVE 08/31/2021 1321    LEUKOCYTESUR SMALL (A) 08/31/2021 1321   Sepsis Labs Recent Labs  Lab 09/12/21 1449 09/13/21 0455  WBC 12.3* 10.2   Microbiology Recent Results (from the past 240 hour(s))  Resp Panel by RT-PCR (Flu A&B, Covid) Anterior Nasal Swab     Status: None   Collection Time: 09/12/21  2:38 PM   Specimen: Anterior Nasal Swab  Result Value Ref Range Status   SARS Coronavirus 2 by RT PCR NEGATIVE NEGATIVE Final    Comment: (NOTE) SARS-CoV-2 target nucleic acids are NOT DETECTED.  The SARS-CoV-2 RNA is generally detectable in upper respiratory specimens during the acute phase of infection. The lowest concentration of SARS-CoV-2 viral copies this assay can detect is 138 copies/mL. A negative result does not preclude SARS-Cov-2 infection and should not be used as the sole basis for treatment or other patient management decisions. A negative result may occur with  improper specimen collection/handling, submission of specimen other than nasopharyngeal swab, presence of viral mutation(s) within the areas targeted by this assay, and inadequate number of viral copies(<138 copies/mL). A negative result must be combined with clinical observations, patient history, and epidemiological information. The expected result is Negative.  Fact Sheet for Patients:  EntrepreneurPulse.com.au  Fact Sheet for Healthcare Providers:  IncredibleEmployment.be  This test is no t yet approved or cleared by the [place] FDA and  has been authorized for detection and/or diagnosis of SARS-CoV-2 by FDA under an Emergency Use Authorization (EUA). This EUA will remain  in effect (meaning this test can be used) for the duration of the COVID-19 declaration under Section 564(b)(1) of the Act, 21 U.S.C.section 360bbb-3(b)(1), unless the authorization is terminated  or revoked sooner.       Influenza A by PCR NEGATIVE NEGATIVE Final   Influenza B by PCR NEGATIVE NEGATIVE Final     Comment: (NOTE) The Xpert Xpress SARS-CoV-2/FLU/RSV plus assay is intended as an aid in the diagnosis of influenza from Nasopharyngeal swab specimens and should not be used as a sole basis for treatment. Nasal washings and aspirates are unacceptable for Xpert Xpress SARS-CoV-2/FLU/RSV testing.  Fact Sheet for  Patients: EntrepreneurPulse.com.au  Fact Sheet for Healthcare Providers: IncredibleEmployment.be  This test is not yet approved or cleared by the [place] FDA and has been authorized for detection and/or diagnosis of SARS-CoV-2 by FDA under an Emergency Use Authorization (EUA). This EUA will remain in effect (meaning this test can be used) for the duration of the COVID-19 declaration under Section 564(b)(1) of the Act, 21 U.S.C. section 360bbb-3(b)(1), unless the authorization is terminated or revoked.  Performed at [facility], [address] [place], [place] [address]   MRSA Next Gen by PCR, Nasal     Status: None   Collection Time: 09/13/21  3:00 AM   Specimen: Nasal Mucosa; Nasal Swab  Result Value Ref Range Status   MRSA by PCR Next Gen NOT DETECTED NOT DETECTED Final    Comment: (NOTE) The GeneXpert MRSA Assay (FDA approved for NASAL specimens only), is one component of a comprehensive MRSA colonization surveillance program. It is not intended to diagnose MRSA infection nor to guide or monitor treatment for MRSA infections. Test performance is not FDA approved in patients less than 13 years old. Performed at [facility], [place] [address] [place], [place] [address]      Patient was seen and examined on the day of discharge and was found to be in stable condition. Time coordinating discharge: 25 minutes including assessment and coordination of care, as well as examination of the patient.   SIGNED:  Dessa Phi, DO [facility] 09/14/2021, 10:04 AM

## 2021-09-16 ENCOUNTER — Telehealth: Payer: Self-pay | Admitting: *Deleted

## 2021-09-16 ENCOUNTER — Ambulatory Visit (INDEPENDENT_AMBULATORY_CARE_PROVIDER_SITE_OTHER): Payer: Medicare PPO

## 2021-09-16 ENCOUNTER — Telehealth: Payer: Self-pay | Admitting: Internal Medicine

## 2021-09-16 DIAGNOSIS — R55 Syncope and collapse: Secondary | ICD-10-CM

## 2021-09-16 NOTE — Telephone Encounter (Signed)
Cynthia [name] states she was in the hospital again from 9/7-9/9 , still has "slight pneumonia left". Was discharged on cefdinir 300 mg, BID X 5 days. Is scheduled for chemo(carbo/gemzar) and to see Dr Alen Blew on Wednesday, 9/13. Wants to know if she should keep appt for chemo.

## 2021-09-16 NOTE — Telephone Encounter (Signed)
Pt c/o BP issue: STAT if pt c/o blurred vision, one-sided weakness or slurred speech  1. What are your last 5 BP readings? 159/98; 157/101  2. Are you having any other symptoms (ex. Dizziness, headache, blurred vision, passed out)? Headache in back of neck and between eyes  3. What is your BP issue? Severe headaches due to high BP and pneumonia

## 2021-09-16 NOTE — Telephone Encounter (Signed)
Spoke with Cynthia [name] who was discharged from hospital on 09/14/2021 for pneumonia.  While in the hospital Cynthia [name] had elevated BP.  She states she was not treated for elevated BP while in the hospital.  She states she has a diagnosis of HTN but has not had to take medication in about 2 years. Cynthia [name]'s PCP has followed her HTN in the past.  Cynthia [name] advised Dr Caryl Comes is out of the office for the next 2 weeks and she should reach out to her PCP as Dr Caryl Comes follow issues with the rhythm of her heart.  Cynthia [name] soes not follow with general cardiology.   Cynthia [name] verbalizes understanding and states she will contact her PCP for appointment and if unable to see PCP she will go to an UC.

## 2021-09-17 ENCOUNTER — Encounter: Payer: Self-pay | Admitting: Oncology

## 2021-09-17 DIAGNOSIS — G4700 Insomnia, unspecified: Secondary | ICD-10-CM | POA: Diagnosis not present

## 2021-09-17 DIAGNOSIS — F419 Anxiety disorder, unspecified: Secondary | ICD-10-CM | POA: Diagnosis not present

## 2021-09-17 DIAGNOSIS — G44219 Episodic tension-type headache, not intractable: Secondary | ICD-10-CM | POA: Diagnosis not present

## 2021-09-17 DIAGNOSIS — R195 Other fecal abnormalities: Secondary | ICD-10-CM | POA: Diagnosis not present

## 2021-09-17 DIAGNOSIS — I10 Essential (primary) hypertension: Secondary | ICD-10-CM | POA: Diagnosis not present

## 2021-09-17 DIAGNOSIS — I4891 Unspecified atrial fibrillation: Secondary | ICD-10-CM | POA: Diagnosis not present

## 2021-09-17 DIAGNOSIS — J9601 Acute respiratory failure with hypoxia: Secondary | ICD-10-CM | POA: Diagnosis not present

## 2021-09-17 MED FILL — Fosaprepitant Dimeglumine For IV Infusion 150 MG (Base Eq): INTRAVENOUS | Qty: 5 | Status: AC

## 2021-09-17 MED FILL — Dexamethasone Sodium Phosphate Inj 100 MG/10ML: INTRAMUSCULAR | Qty: 1 | Status: AC

## 2021-09-17 NOTE — Telephone Encounter (Signed)
LM to come for appt as scheduled on Wednesday. Dr Alen Blew to assess.

## 2021-09-18 ENCOUNTER — Encounter: Payer: Self-pay | Admitting: *Deleted

## 2021-09-18 ENCOUNTER — Other Ambulatory Visit: Payer: Self-pay

## 2021-09-18 ENCOUNTER — Inpatient Hospital Stay: Payer: Medicare PPO

## 2021-09-18 ENCOUNTER — Inpatient Hospital Stay: Payer: Medicare PPO | Attending: Oncology | Admitting: Oncology

## 2021-09-18 VITALS — BP 171/84 | HR 73 | Temp 98.3°F | Resp 17 | Ht 65.0 in | Wt 163.8 lb

## 2021-09-18 DIAGNOSIS — R519 Headache, unspecified: Secondary | ICD-10-CM | POA: Diagnosis not present

## 2021-09-18 DIAGNOSIS — C679 Malignant neoplasm of bladder, unspecified: Secondary | ICD-10-CM

## 2021-09-18 DIAGNOSIS — Z7901 Long term (current) use of anticoagulants: Secondary | ICD-10-CM | POA: Diagnosis not present

## 2021-09-18 DIAGNOSIS — Z5111 Encounter for antineoplastic chemotherapy: Secondary | ICD-10-CM | POA: Insufficient documentation

## 2021-09-18 DIAGNOSIS — Z853 Personal history of malignant neoplasm of breast: Secondary | ICD-10-CM | POA: Insufficient documentation

## 2021-09-18 DIAGNOSIS — Z95828 Presence of other vascular implants and grafts: Secondary | ICD-10-CM

## 2021-09-18 DIAGNOSIS — Z79890 Hormone replacement therapy (postmenopausal): Secondary | ICD-10-CM | POA: Diagnosis not present

## 2021-09-18 DIAGNOSIS — Z79899 Other long term (current) drug therapy: Secondary | ICD-10-CM | POA: Insufficient documentation

## 2021-09-18 DIAGNOSIS — Z79811 Long term (current) use of aromatase inhibitors: Secondary | ICD-10-CM | POA: Insufficient documentation

## 2021-09-18 DIAGNOSIS — D701 Agranulocytosis secondary to cancer chemotherapy: Secondary | ICD-10-CM | POA: Insufficient documentation

## 2021-09-18 DIAGNOSIS — R0989 Other specified symptoms and signs involving the circulatory and respiratory systems: Secondary | ICD-10-CM | POA: Diagnosis not present

## 2021-09-18 DIAGNOSIS — R42 Dizziness and giddiness: Secondary | ICD-10-CM | POA: Insufficient documentation

## 2021-09-18 LAB — CMP (CANCER CENTER ONLY)
ALT: 14 U/L (ref 0–44)
AST: 24 U/L (ref 15–41)
Albumin: 3.7 g/dL (ref 3.5–5.0)
Alkaline Phosphatase: 48 U/L (ref 38–126)
Anion gap: 8 (ref 5–15)
BUN: 17 mg/dL (ref 8–23)
CO2: 28 mmol/L (ref 22–32)
Calcium: 9.6 mg/dL (ref 8.9–10.3)
Chloride: 106 mmol/L (ref 98–111)
Creatinine: 1.03 mg/dL — ABNORMAL HIGH (ref 0.44–1.00)
GFR, Estimated: 56 mL/min — ABNORMAL LOW (ref >60)
Glucose, Bld: 114 mg/dL — ABNORMAL HIGH (ref 70–99)
Potassium: 4 mmol/L (ref 3.5–5.1)
Sodium: 142 mmol/L (ref 135–145)
Total Bilirubin: 0.4 mg/dL (ref 0.3–1.2)
Total Protein: 7 g/dL (ref 6.5–8.1)

## 2021-09-18 LAB — CBC WITH DIFFERENTIAL (CANCER CENTER ONLY)
Abs Immature Granulocytes: 0.05 10*3/uL (ref 0.00–0.07)
Basophils Absolute: 0.1 10*3/uL (ref 0.0–0.1)
Basophils Relative: 2 %
Eosinophils Absolute: 0.1 10*3/uL (ref 0.0–0.5)
Eosinophils Relative: 1 %
HCT: 34 % — ABNORMAL LOW (ref 36.0–46.0)
Hemoglobin: 11 g/dL — ABNORMAL LOW (ref 12.0–15.0)
Immature Granulocytes: 1 %
Lymphocytes Relative: 26 %
Lymphs Abs: 1.2 10*3/uL (ref 0.7–4.0)
MCH: 32.5 pg (ref 26.0–34.0)
MCHC: 32.4 g/dL (ref 30.0–36.0)
MCV: 100.6 fL — ABNORMAL HIGH (ref 80.0–100.0)
Monocytes Absolute: 0.7 10*3/uL (ref 0.1–1.0)
Monocytes Relative: 16 %
Neutro Abs: 2.5 10*3/uL (ref 1.7–7.7)
Neutrophils Relative %: 54 %
Platelet Count: 282 10*3/uL (ref 150–400)
RBC: 3.38 MIL/uL — ABNORMAL LOW (ref 3.87–5.11)
RDW: 19.2 % — ABNORMAL HIGH (ref 11.5–15.5)
WBC Count: 4.6 10*3/uL (ref 4.0–10.5)
nRBC: 0 % (ref 0.0–0.2)

## 2021-09-18 MED ORDER — SODIUM CHLORIDE 0.9 % IV SOLN
400.5000 mg | Freq: Once | INTRAVENOUS | Status: AC
  Administered 2021-09-18: 400 mg via INTRAVENOUS
  Filled 2021-09-18: qty 40

## 2021-09-18 MED ORDER — SODIUM CHLORIDE 0.9% FLUSH
10.0000 mL | Freq: Once | INTRAVENOUS | Status: AC
  Administered 2021-09-18: 10 mL

## 2021-09-18 MED ORDER — SODIUM CHLORIDE 0.9% FLUSH
10.0000 mL | INTRAVENOUS | Status: DC | PRN
  Administered 2021-09-18: 10 mL

## 2021-09-18 MED ORDER — PALONOSETRON HCL INJECTION 0.25 MG/5ML
0.2500 mg | Freq: Once | INTRAVENOUS | Status: AC
  Administered 2021-09-18: 0.25 mg via INTRAVENOUS
  Filled 2021-09-18: qty 5

## 2021-09-18 MED ORDER — SODIUM CHLORIDE 0.9 % IV SOLN
150.0000 mg | Freq: Once | INTRAVENOUS | Status: AC
  Administered 2021-09-18: 150 mg via INTRAVENOUS
  Filled 2021-09-18: qty 150

## 2021-09-18 MED ORDER — HEPARIN SOD (PORK) LOCK FLUSH 100 UNIT/ML IV SOLN
500.0000 [IU] | Freq: Once | INTRAVENOUS | Status: AC | PRN
  Administered 2021-09-18: 500 [IU]

## 2021-09-18 MED ORDER — SODIUM CHLORIDE 0.9 % IV SOLN
10.0000 mg | Freq: Once | INTRAVENOUS | Status: AC
  Administered 2021-09-18: 10 mg via INTRAVENOUS
  Filled 2021-09-18: qty 10

## 2021-09-18 MED ORDER — SODIUM CHLORIDE 0.9 % IV SOLN
800.0000 mg/m2 | Freq: Once | INTRAVENOUS | Status: AC
  Administered 2021-09-18: 1482 mg via INTRAVENOUS
  Filled 2021-09-18: qty 38.98

## 2021-09-18 MED ORDER — SODIUM CHLORIDE 0.9 % IV SOLN: Freq: Once | INTRAVENOUS | Status: AC

## 2021-09-18 NOTE — Progress Notes (Signed)
Hematology and Oncology Follow Up Visit  Cynthia [name] [id number] 1945/08/01 76 y.o. 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     Prior Therapy:  She is status post left nephro ureterectomy in 2021.  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  Current therapy: Carboplatin and gemcitabine started on July 17, 2021.  She is here for day 1 cycle 4 of therapy.  Interim History: Cynthia [name] is here for return evaluation.  Since last visit, she was hospitalized up to September 14, 2021 for multifocal pneumonia and respiratory failure.  Since her discharge, she reports feeling better with improvement in her respiratory symptoms.  She denies any cough, wheezing or hemoptysis.  She has reported some dizziness and headaches related to increase her blood pressure.  She was started on losartan by her primary care physician.  She denies any bone pain or pathological fractures.  She denies any excessive fatigue or tiredness.     Medications: Updated on review. Current Outpatient Medications  Medication Sig Dispense Refill   acetaminophen (TYLENOL) 500 MG tablet Take 1,000 mg by mouth at bedtime. May take an additional 1000 mg by mouth for headaches as needed     albuterol (VENTOLIN HFA) 108 (90 Base) MCG/ACT inhaler Inhale 2 puffs into the lungs every 4 (four) hours as needed for wheezing or shortness of breath. (Patient not taking: Reported on 09/13/2021) 8 g 0   ALPRAZolam (XANAX) 1 MG tablet Take 1 mg by mouth at bedtime.     amiodarone (PACERONE) 100 MG tablet Take 2 tablets (200 mg total) by mouth daily. 90 tablet 3   anastrozole (ARIMIDEX) 1 MG tablet TAKE 1 TABLET BY MOUTH EVERY DAY (Patient taking differently: Take 1 mg by mouth in the morning.) 90 tablet 4   apixaban (ELIQUIS) 5 MG TABS tablet TAKE 1 TABLET BY MOUTH  TWICE A DAY (Patient taking differently: Take 5 mg by mouth 2 (two) times daily.) 60 tablet 6   cefdinir (OMNICEF) 300 MG capsule Take 1 capsule (300 mg total) by mouth 2 (two) times daily. 10 capsule 0   Cholecalciferol (VITAMIN D-3) 25 MCG (1000 UT) CAPS Take 1,000 Units by mouth daily with breakfast.     Choline Fenofibrate (FENOFIBRIC ACID) 135 MG CPDR Take 135 mg by mouth daily.      docusate sodium (COLACE) 100 MG capsule Take 200 mg by mouth as needed for mild constipation.     ferrous sulfate 325 (65 FE) MG tablet Take 325 mg by mouth at bedtime.     FIBER PO Take 8 tablets by mouth daily.     fluconazole (DIFLUCAN) 150 MG tablet Take 1 tablet (150 mg total) by mouth once as needed for up to 1 dose (yeast infection). 1 tablet 0   levothyroxine (SYNTHROID, LEVOTHROID) 112 MCG tablet Take 112 mcg by mouth daily.     lidocaine-prilocaine (EMLA) cream Apply 1 Application topically as needed. (Patient taking differently: Apply 1 Application topically as needed (port access).) 30 g 0   pantoprazole (PROTONIX) 40 MG tablet Take 40 mg by mouth 2 (two) times daily.     prochlorperazine (COMPAZINE) 10 MG tablet Take 1 tablet (10 mg total) by mouth every 6 (six) hours as needed for nausea or vomiting. (Patient taking differently: Take 10 mg by mouth as needed for nausea or vomiting.) 30 tablet 0  traZODone (DESYREL) 50 MG tablet Take 1 tablet (50 mg total) by mouth at bedtime. 30 tablet 0   No current facility-administered medications for this visit.     Allergies: No Known Allergies    Physical Exam: Blood pressure (!) 171/84, pulse 73, temperature 98.3 F (36.8 C), temperature source Temporal, resp. rate 17, height '5\' 5"'$  (1.651 m), weight 163 lb 12.8 oz (74.3 kg), SpO2 100 %.   ECOG: 1    General appearance: Comfortable appearing without any discomfort Head: Normocephalic without any trauma Oropharynx: Mucous membranes are moist and pink without any thrush or ulcers. Eyes: Pupils  are equal and round reactive to light. Lymph nodes: No cervical, supraclavicular, inguinal or axillary lymphadenopathy.   Heart:regular rate and rhythm.  S1 and S2 without leg edema. Lung: Clear without any rhonchi or wheezes.  No dullness to percussion. Abdomin: Soft, nontender, nondistended with good bowel sounds.  No hepatosplenomegaly. Musculoskeletal: No joint deformity or effusion.  Full range of motion noted. Neurological: No deficits noted on motor, sensory and deep tendon reflex exam. Skin: No petechial rash or dryness.  Appeared moist.       Lab Results: Lab Results  Component Value Date   WBC 10.2 09/13/2021   HGB 8.9 (L) 09/13/2021   HCT 27.5 (L) 09/13/2021   MCV 100.7 (H) 09/13/2021   PLT 152 09/13/2021     Chemistry      Component Value Date/Time   NA 140 09/14/2021 1042   NA 128 (L) 05/27/2019 1151   NA 141 03/19/2016 1216   K 3.7 09/14/2021 1042   K 3.5 03/19/2016 1216   CL 108 09/14/2021 1042   CO2 26 09/14/2021 1042   CO2 29 03/19/2016 1216   BUN 13 09/14/2021 1042   BUN 15 05/27/2019 1151   BUN 19.2 03/19/2016 1216   CREATININE 0.86 09/14/2021 1042   CREATININE 0.95 08/29/2021 1125   CREATININE 1.4 (H) 03/19/2016 1216      Component Value Date/Time   CALCIUM 9.0 09/14/2021 1042   CALCIUM 9.9 03/19/2016 1216   ALKPHOS 37 (L) 09/02/2021 0432   ALKPHOS 45 03/19/2016 1216   AST 36 09/02/2021 0432   AST 21 08/29/2021 1125   AST 15 03/19/2016 1216   ALT 27 09/02/2021 0432   ALT 13 08/29/2021 1125   ALT 10 03/19/2016 1216   BILITOT 0.7 09/02/2021 0432   BILITOT 0.3 08/29/2021 1125   BILITOT 0.42 03/19/2016 1216       Impression and Plan:  76 year old with:  1.  Bladder cancer diagnosed in May 2023.  She was found to have multifocal high-grade papillary urothelial carcinoma.  The natural course of her disease was reviewed and risks and benefits of proceeding with chemotherapy were discussed at this time.  Complications that include nausea,  vomiting, myelosuppression, neutropenia and possible sepsis were discussed.  The plan is to complete 4 cycles of therapy before proceeding with radical cystectomy.  She is agreeable to proceed at this time.  She feels that she has recovered well enough to proceed with treatment.  We will update staging scan prior to proceeding with cystectomy.     2.  IV access: Port-A-Cath currently in use without any issues.   3.  Antiemetics: Compazine is available to her without any nausea or vomiting.   4.  Renal function surveillance: Her kidney function is within normal range at this time.  Continue to monitor on platinum therapy.   5.  Goals of care: Therapy remains curative at  this time and aggressive measures are warranted.   6.  Breast cancer: No evidence of relapse and continues to be on Arimidex.  7.  Neutropenia: Due to chemotherapy and we will continue to monitor.  White cell count is adequate.   8.  Follow-up: We the next 4 weeks after completing staging scans.   30  minutes were dedicated to this visit.  The time spent on reviewing laboratory data, disease status update and outlining future plan of care discussion.   Zola Button, MD 9/13/202312:38 PM

## 2021-09-18 NOTE — Patient Instructions (Signed)
[facility]   Discharge Instructions: Thank you for choosing [facility] to provide your oncology and hematology care.   If you have a lab appointment with the [facility], please go directly to the [facility] and check in at the registration area.   Wear comfortable clothing and clothing appropriate for easy access to any Portacath or PICC line.   We strive to give you quality time with your provider. You may need to reschedule your appointment if you arrive late (15 or more minutes).  Arriving late affects you and other patients whose appointments are after yours.  Also, if you miss three or more appointments without notifying the office, you may be dismissed from the clinic at the provider's discretion.      For prescription refill requests, have your pharmacy contact our office and allow 72 hours for refills to be completed.    Today you received the following chemotherapy and/or immunotherapy agents: gemcitabine and carboplatin      To help prevent nausea and vomiting after your treatment, we encourage you to take your nausea medication as directed.  BELOW ARE SYMPTOMS THAT SHOULD BE REPORTED IMMEDIATELY: *FEVER GREATER THAN 100.4 F (38 C) OR HIGHER *CHILLS OR SWEATING *NAUSEA AND VOMITING THAT IS NOT CONTROLLED WITH YOUR NAUSEA MEDICATION *UNUSUAL SHORTNESS OF BREATH *UNUSUAL BRUISING OR BLEEDING *URINARY PROBLEMS (pain or burning when urinating, or frequent urination) *BOWEL PROBLEMS (unusual diarrhea, constipation, pain near the anus) TENDERNESS IN MOUTH AND THROAT WITH OR WITHOUT PRESENCE OF ULCERS (sore throat, sores in mouth, or a toothache) UNUSUAL RASH, SWELLING OR PAIN  UNUSUAL VAGINAL DISCHARGE OR ITCHING   Items with * indicate a potential emergency and should be followed up as soon as possible or go to the Emergency Department if any problems should occur.  Please show the CHEMOTHERAPY ALERT CARD or IMMUNOTHERAPY ALERT  CARD at check-in to the Emergency Department and triage nurse.  Should you have questions after your visit or need to cancel or reschedule your appointment, please contact [facility]  Dept: [phone]  and follow the prompts.  Office hours are 8:00 a.m. to 4:30 p.m. Monday - Friday. Please note that voicemails left after 4:00 p.m. may not be returned until the following business day.  We are closed weekends and major holidays. You have access to a nurse at all times for urgent questions. Please call the main number to the clinic Dept: [phone] and follow the prompts.   For any non-urgent questions, you may also contact your provider using MyChart. We now offer e-Visits for anyone 43 and older to request care online for non-urgent symptoms. For details visit [email or web address].   Also download the MyChart app! Go to the app store, search "MyChart", open the app, select [facility], and log in with your MyChart username and password.  Masks are optional in the cancer centers. If you would like for your care team to wear a mask while they are taking care of you, please let them know. You may have one support person who is at least 78 years old accompany you for your appointments.

## 2021-09-18 NOTE — Patient Instructions (Signed)

## 2021-09-19 ENCOUNTER — Encounter: Payer: Self-pay | Admitting: Neurology

## 2021-09-19 ENCOUNTER — Ambulatory Visit: Payer: Medicare PPO | Admitting: Neurology

## 2021-09-19 VITALS — BP 129/73 | HR 64 | Ht 65.5 in | Wt 167.0 lb

## 2021-09-19 DIAGNOSIS — G2581 Restless legs syndrome: Secondary | ICD-10-CM | POA: Diagnosis not present

## 2021-09-19 NOTE — Patient Instructions (Signed)
It was nice to meet you today.  I wish you good luck with your upcoming bladder surgery.  As discussed, we will forego any testing or medication trials for restless leg syndrome at this time.  We will consider a sleep study in the near future, after your next appointment, please follow-up for recheck in about 6 months in this clinic.

## 2021-09-19 NOTE — Progress Notes (Addendum)
Subjective:    Patient ID: Cynthia [name] is a 76 y.o. female.  HPI    Star Age, MD, PhD [facility] [address], [address] [place], [place] [address] Dear Marchia Meiers,   I saw your patient, requested my neurologic clinic today for initial consultation of her restless leg syndrome.  The patient is unaccompanied today.  Patient comments Cynthia [name] is a 76 year old right-handed woman with a complex medical history of bladder cancer, breast cancer, kidney cancer, bradycardia, arthritis, anxiety, first-degree heart block, paroxysmal A-fib, with status post AICD placement, history of radiation therapy, hypertension, hyperlipidemia, hypothyroidism, irritable bowel syndrome, degenerative disc disease of the lumbar spine, anxiety, status post multiple surgeries including right breast lumpectomy, cholecystectomy, laparoscopic nephroureterectomy, transurethral resection of bladder tumor, and mildly overweight state, who reports a longstanding history of restless leg syndrome.  I reviewed your office note from 06/17/2021.  She has recently started trazodone at night to help her sleep which is helping.  She has a longstanding history of restless leg syndrome and has to sometimes get up at night and take a warm bath or walk around.  She has not been on any prescription medication for restless legs itself.  She had seen Dr. Maxwell Caul in sleep medicine some 8 months ago, has never had a sleep study and has not received any treatment through his office.  She has since been admitted recently.  Bedtime could be around 1:45 AM.  She does take her trazodone late and may be asleep by 2 or 2:30 AM.  She sleeps till 10:30 AM or 11:30 AM.  She has 1 more chemotherapy pending.  She had chemotherapy yesterday.  She is not keen on starting any new medications as she is also looking at getting her bladder cancer surgery in November, she will have a urostomy after that.  She is not sure about any family  history of restless leg syndrome.  She had a tonsillectomy as a child.  She is supposed to keep a blood pressure log for about 3 weeks, checking it once a day.  She has tried mirtazapine to help her sleep at night but she had significant weight gain from it.  She has been on Xanax for about 40 years and tried to come off of it but had withdrawal.  She continues to take it.  Epworth sleepiness score is 4 out of 24, fatigue severity score is 38 out of 63.  She lives with her husband, her son and daughter-in-law live with them in the townhome, she has a daughter in [place].  Of note, she had recent hospitalizations.  She was in the hospital as last week for acute respiratory failure with hypoxia.  She was hospitalized in August for acute respiratory failure with hypoxia in the context of pneumonia.  She had a hospitalization from 08/31/2021 through 09/03/2021.  She was readmitted on 09/12/2021 through 09/14/2021.  She also presented to the emergency room on 08/19/2021 with fever and dysuria.  Of note, she is followed by oncology and is currently receiving chemotherapy.  She has a single kidney.   She had blood work yesterday on 09/18/2021 which showed a low hemoglobin at 11, actually improved from September 8.  Hematocrit 34, MCV elevated at 100.6, platelets normal at 282.  CMP showed glucose of 114, BUN 17, creatinine 1.03.  GFR below normal at 56 mL/min.  Blood work from 09/14/2021 was also reviewed, magnesium 2.1, BMP with benign findings, CBC showed hemoglobin of 8.9, hematocrit 27.5, MCV  100.7.  Her Past Medical History Is Significant For: Past Medical History:  Diagnosis Date   AICD (automatic cardioverter/defibrillator) present    Medtronic   Anticoagulant long-term use    eliquis--- managed by cardiology   Anxiety    Arthritis    Bladder cancer ([place])    Bradycardia    Breast cancer ([place])    left, s/p lumpectomy, Dr [name]   Cancer of kidney [facility])    left   DDD (degenerative disc disease), lumbar     Depression    Dyspnea    ON EXERTION   First degree heart block    Genetic testing 06/25/2016   Ms. Riveron underwent genetic counseling and testing for hereditary cancer syndromes on 06/17/2016. Her results were negative for mutations in all 46 genes analyzed by [facility]'s 46-gene Common Hereditary Cancers Panel. Genes analyzed include: APC, ATM, AXIN2, BARD1, BMPR1A, BRCA1, BRCA2, BRIP1, CDH1, CDKN2A, CHEK2, CTNNA1, DICER1, EPCAM, GREM1, HOXB13, KIT, MEN1, MLH1, MSH2, MSH3, MSH6, MUTYH, NBN,   GERD (gastroesophageal reflux disease)    occasional,  takes pepcid   Hematuria    History of radiation therapy 05/22/2016 to 06/20/2016   right breast cancer   Hyperlipidemia    Hypertension    followd by pcp---  (02-22-2019 per pt never had stress test)   Hypothyroidism    followed by pcp   IBS (irritable bowel syndrome)    Incomplete right bundle branch block    Insomnia    Malignant neoplasm of upper-inner quadrant of right breast in female, estrogen receptor positive [facility]) oncologist--- dr [name]   dx 03/ 2018,  Stage IA, IDC, ER/PR +;  04-01-2016 s/p  right breast lumpectomy w/ node dissection's;  completed radiation 06-20-2016   MVP (mitral valve prolapse)    per echo 12-11-2018 in epic, mild    NSVT (nonsustained ventricular tachycardia) ([place]) followed by cardiology   12-10-2018  hospital admission ,  refer to discharge note 12-14-2018 for treatement   PAF (paroxysmal atrial fibrillation) [facility]) primary cardiologist--- dr Margaretann Loveless   newly dx 12-10-2018  admission in epic, Afib w/ RVR and NSVT;   in epic TTE 12-11-2018 showed ef 50-55%, mild MVP,  mild AV sclerosis without stenosis;   Cardiac MRI 12-13-2018 in epic   Prediabetes    Renal mass, left    pelvis    Restless leg syndrome    occasional   RLS (restless legs syndrome)    Type 2 diabetes mellitus ([place])    followed by pcp---  (02-22-2019 does not check blood sugar's)    Her Past Surgical History Is Significant For: Past  Surgical History:  Procedure Laterality Date   BREAST LUMPECTOMY WITH RADIOACTIVE SEED AND SENTINEL LYMPH NODE BIOPSY Right 04/01/2016   Procedure: RADIOACTIVE SEED GUIDED RIGHT BREAST LUMPECTOMY WITH RIGHT AXILLARY SENTINEL LYMPH NODE BIOPSY.;  Surgeon: Fanny Skates, MD;  Location: [facility];  Service: General;  Laterality: Right;   CARDIAC CATHETERIZATION  12/11/2018   CATARACT EXTRACTION W/ INTRAOCULAR LENS  IMPLANT, BILATERAL  1980s   COLONOSCOPY     CYSTOSCOPY W/ RETROGRADES Right 06/05/2021   Procedure: CYSTOSCOPY WITH RETROGRADE PYELOGRAM;  Surgeon: Alexis Frock, MD;  Location: [facility];  Service: Urology;  Laterality: Right;   CYSTOSCOPY/RETROGRADE/URETEROSCOPY N/A 03/02/2019   Procedure: CYSTOSCOPY/LEFT RETROGRADE/URETEROSCOPY/  BIOPSY/  STENT;  Surgeon: Ceasar Mons, MD;  Location: [facility];  Service: Urology;  Laterality: N/A;   INCISIONAL HERNIA REPAIR N/A 03/29/2020   Procedure: REPAIR OF INCISIONAL HERNIA WITH MESH;  Surgeon:  Cornett, Thomas, MD;  Location: [facility];  Service: General;  Laterality: N/A;   INSERTION OF ICD     medtronic   IR IMAGING GUIDED PORT INSERTION  07/18/2021   LAPAROSCOPIC CHOLECYSTECTOMY  1990s   ROBOT ASSITED LAPAROSCOPIC NEPHROURETERECTOMY Left 04/27/2019   Procedure: XI ROBOT ASSITED LAPAROSCOPIC NEPHROURETERECTOMY;  Surgeon: Manny, Theodore, MD;  Location: [facility];  Service: Urology;  Laterality: Left;  3.5 HRS   TONSILLECTOMY  age 5   TRANSURETHRAL RESECTION OF BLADDER TUMOR N/A 06/05/2021   Procedure: TRANSURETHRAL RESECTION OF BLADDER TUMOR (TURBT);  Surgeon: Manny, Theodore, MD;  Location: [facility];  Service: Urology;  Laterality: N/A;    Her Family History Is Significant For: Family History  Problem Relation Age of Onset   Congestive Heart Failure Mother    Breast cancer Sister 52       d.54   Cervical cancer Sister 28   Leukemia Brother 60   Leukemia Son    Ovarian cancer Maternal Aunt 92       d.92s   Breast cancer  Other 45   Restless legs syndrome Neg Hx    Sleep apnea Neg Hx     Her Social History Is Significant For: Social History   Socioeconomic History   Marital status: Married    Spouse name: Not on file   Number of children: Not on file   Years of education: Not on file   Highest education level: Not on file  Occupational History   Not on file  Tobacco Use   Smoking status: Never   Smokeless tobacco: Never  Vaping Use   Vaping Use: Never used  Substance and Sexual Activity   Alcohol use: Not Currently    Comment: maybe a mixed drink but rarely, maybe 2 in the last year   Drug use: Never   Sexual activity: Not on file  Other Topics Concern   Not on file  Social History Narrative   Caffeine: no more than 1 coke per day    Social Determinants of Health   Financial Resource Strain: Not on file  Food Insecurity: No Food Insecurity (09/13/2021)   Hunger Vital Sign    Worried About Running Out of Food in the Last Year: Never true    Ran Out of Food in the Last Year: Never true  Transportation Needs: No Transportation Needs (09/13/2021)   PRAPARE - Transportation    Lack of Transportation (Medical): No    Lack of Transportation (Non-Medical): No  Physical Activity: Not on file  Stress: Not on file  Social Connections: Not on file    Her Allergies Are:  No Known Allergies:   Her Current Medications Are:  Outpatient Encounter Medications as of 09/19/2021  Medication Sig   acetaminophen (TYLENOL) 500 MG tablet Take 1,000 mg by mouth at bedtime. May take an additional 1000 mg by mouth for headaches as needed   ALPRAZolam (XANAX) 1 MG tablet Take 1 mg by mouth at bedtime.   amiodarone (PACERONE) 100 MG tablet Take 2 tablets (200 mg total) by mouth daily.   anastrozole (ARIMIDEX) 1 MG tablet TAKE 1 TABLET BY MOUTH EVERY DAY (Patient taking differently: Take 1 mg by mouth in the morning.)   apixaban (ELIQUIS) 5 MG TABS tablet TAKE 1 TABLET BY MOUTH TWICE A DAY (Patient taking  differently: Take 5 mg by mouth 2 (two) times daily.)   Cholecalciferol (VITAMIN D-3) 25 MCG (1000 UT) CAPS Take 1,000 Units by mouth daily with breakfast.     Choline Fenofibrate (FENOFIBRIC ACID) 135 MG CPDR Take 135 mg by mouth daily.    docusate sodium (COLACE) 100 MG capsule Take 200 mg by mouth as needed for mild constipation.   ferrous sulfate 325 (65 FE) MG tablet Take 325 mg by mouth at bedtime.   FIBER PO Take 8 tablets by mouth daily.   levothyroxine (SYNTHROID, LEVOTHROID) 112 MCG tablet Take 112 mcg by mouth daily.   lidocaine-prilocaine (EMLA) cream Apply 1 Application topically as needed. (Patient taking differently: Apply 1 Application topically as needed (port access).)   losartan (COZAAR) 25 MG tablet Take 25 mg by mouth daily.   pantoprazole (PROTONIX) 40 MG tablet Take 40 mg by mouth 2 (two) times daily.   prochlorperazine (COMPAZINE) 10 MG tablet Take 1 tablet (10 mg total) by mouth every 6 (six) hours as needed for nausea or vomiting. (Patient taking differently: Take 10 mg by mouth as needed for nausea or vomiting.)   traZODone (DESYREL) 50 MG tablet Take 1 tablet (50 mg total) by mouth at bedtime.   albuterol (VENTOLIN HFA) 108 (90 Base) MCG/ACT inhaler Inhale 2 puffs into the lungs every 4 (four) hours as needed for wheezing or shortness of breath. (Patient not taking: Reported on 09/13/2021)   cefdinir (OMNICEF) 300 MG capsule Take 1 capsule (300 mg total) by mouth 2 (two) times daily. (Patient not taking: Reported on 09/19/2021)   fluconazole (DIFLUCAN) 150 MG tablet Take 1 tablet (150 mg total) by mouth once as needed for up to 1 dose (yeast infection). (Patient not taking: Reported on 09/19/2021)   No facility-administered encounter medications on file as of 09/19/2021.  :   Review of Systems:  Out of a complete 14 point review of systems, all are reviewed and negative with the exception of these symptoms as listed below:  Review of Systems  Neurological:        Patient  is here alone for RLS evaluation. She states she has dealt with RLS and insomnia for a long time. She is currently taking her last chemo treatments before she has a bladder cancer surgery. She states in the 90s she had a severe sleep disturbance after dealing with a difficult time when her husband had surgery. She was placed on Ambien. It stopped working about 4-5 years ago. She was taken off a sleep medication that started with an "M" (not Melatonin) and now she is on Trazodone which does help. She states she has never tried medication directly for RLS.     Objective:  Neurological Exam  Physical Exam Physical Examination:   Vitals:   09/19/21 1439  BP: 129/73  Pulse: 64    General Examination: The patient is a very pleasant 76 y.o. female in no acute distress. She appears well-developed and well-nourished and well groomed.   HEENT: Normocephalic, atraumatic, pupils are equal, round and reactive to light, extraocular tracking is good without limitation to gaze excursion or nystagmus noted. Hearing is grossly intact. Face is symmetric with mild facial puffiness noted, normal facial animation. Speech is clear with no dysarthria noted. There is no hypophonia. There is no lip, neck/head, jaw or voice tremor. Neck is supple with full range of passive and active motion. There are no carotid bruits on auscultation. Oropharynx exam reveals: mild mouth dryness, adequate dental hygiene and mild airway crowding, due to redundant soft palate, tonsils absent.  Chest: Clear to auscultation without wheezing, rhonchi or crackles noted.  Heart: S1+S2+0, regular with mild systolic murmur noted.  Abdomen: Soft, non-tender and non-distended.  Extremities: There is 1+ pitting edema in the distal lower extremities bilaterally.   Skin: Warm and dry without trophic changes noted.   Musculoskeletal: exam reveals no obvious joint deformities, tenderness or joint swelling or erythema.   Neurologically:   Mental status: The patient is awake, alert and oriented in all 4 spheres. Her immediate and remote memory, attention, language skills and fund of knowledge are appropriate. There is no evidence of aphasia, agnosia, apraxia or anomia. Speech is clear with normal prosody and enunciation. Thought process is linear. Mood is normal and affect is normal.  Cranial nerves II - XII are as described above under HEENT exam.  Motor exam: Normal bulk, strength and tone is noted. There is no obvious tremor. Fine motor skills and coordination: grossly intact.  Cerebellar testing: No dysmetria or intention tremor. There is no truncal or gait ataxia.  Sensory exam: intact to light touch in the upper and lower extremities.  Gait, station and balance: She stands easily. No veering to one side is noted. No leaning to one side is noted. Posture is mildly stooped for age and she walks with a mild limp.    Assessment and Plan:  In summary, Cynthia [name] is a very pleasant 76 y.o.-year old female with a complex medical history of bladder cancer, breast cancer, kidney cancer, bradycardia, arthritis, anxiety, first-degree heart block, paroxysmal A-fib, with status post AICD placement, history of radiation therapy, hypertension, hyperlipidemia, hypothyroidism, irritable bowel syndrome, degenerative disc disease of the lumbar spine, anxiety, status post multiple surgeries including right breast lumpectomy, cholecystectomy, laparoscopic nephroureterectomy, transurethral resection of bladder tumor, and mildly overweight state, who presents for evaluation of her longstanding history of restless leg syndrome.  She has a complicated history and is currently undergoing chemotherapy in preparation for bladder cancer surgery.  We mutually agreed to forego any additional testing and medication trials at this time for restless leg syndrome.  We may consider a sleep study after her cancer surgery and after she has had a chance to recuperate from  her surgery.  She may benefit from a trial of low-dose medication such as ropinirole or gabapentin but we have to keep in mind that she is taking several different medications including high risk medications and she only has 1 kidney.  For now, she is on low-dose trazodone and feels that she has slept fairly well.  We will consider a sleep study in the future.  She is advised to follow-up in this clinic for recheck in about 6 months, sooner if needed.  If her sleep study shows that she has obstructive sleep apnea we will likely proceed with treatment for sleep apnea for such as an AutoPap machine.  She would be willing to consider it.  She is familiar with sleep apnea as her husband has a machine.  She would be willing to try AutoPap therapy if the need arises.  We will plan a sleep study after her next visit. I answered all her questions today and she was in agreement.   Thank you very much for allowing me to participate in the care of this nice patient. If I can be of any further assistance to you please do not hesitate to call me at [phone].  Sincerely,   Star Age, MD, PhD  This was an extended visit over over 1 hour with copious record review.

## 2021-09-20 ENCOUNTER — Other Ambulatory Visit: Payer: Self-pay

## 2021-09-20 ENCOUNTER — Other Ambulatory Visit: Payer: Self-pay | Admitting: Internal Medicine

## 2021-09-20 DIAGNOSIS — I4891 Unspecified atrial fibrillation: Secondary | ICD-10-CM

## 2021-09-20 NOTE — Telephone Encounter (Signed)
Prescription refill request for Eliquis received. Indication: Afib  Last office visit: 02/26/21 Caryl Comes) Scr: 1.03 (09/18/21) Age: 76 Weight: 75.8kg  Appropriate dose and refill sent to requested pharmacy.

## 2021-09-24 ENCOUNTER — Telehealth: Payer: Self-pay | Admitting: *Deleted

## 2021-09-24 NOTE — Telephone Encounter (Signed)
PC to patient, no answer, left VM - informed patient her PET scan has been authorized by her insurance & may be scheduled at any time by [employer], [phone].  Instructed patient to call this office with any questions/concerns, [phone].

## 2021-09-25 ENCOUNTER — Inpatient Hospital Stay: Payer: Medicare PPO

## 2021-09-25 VITALS — BP 134/72 | HR 64 | Temp 98.6°F | Resp 17 | Wt 161.5 lb

## 2021-09-25 DIAGNOSIS — R0989 Other specified symptoms and signs involving the circulatory and respiratory systems: Secondary | ICD-10-CM | POA: Diagnosis not present

## 2021-09-25 DIAGNOSIS — R519 Headache, unspecified: Secondary | ICD-10-CM | POA: Diagnosis not present

## 2021-09-25 DIAGNOSIS — Z95828 Presence of other vascular implants and grafts: Secondary | ICD-10-CM

## 2021-09-25 DIAGNOSIS — R42 Dizziness and giddiness: Secondary | ICD-10-CM | POA: Diagnosis not present

## 2021-09-25 DIAGNOSIS — Z7901 Long term (current) use of anticoagulants: Secondary | ICD-10-CM | POA: Diagnosis not present

## 2021-09-25 DIAGNOSIS — D701 Agranulocytosis secondary to cancer chemotherapy: Secondary | ICD-10-CM | POA: Diagnosis not present

## 2021-09-25 DIAGNOSIS — Z79811 Long term (current) use of aromatase inhibitors: Secondary | ICD-10-CM | POA: Diagnosis not present

## 2021-09-25 DIAGNOSIS — Z79899 Other long term (current) drug therapy: Secondary | ICD-10-CM | POA: Diagnosis not present

## 2021-09-25 DIAGNOSIS — Z5111 Encounter for antineoplastic chemotherapy: Secondary | ICD-10-CM | POA: Diagnosis not present

## 2021-09-25 DIAGNOSIS — C679 Malignant neoplasm of bladder, unspecified: Secondary | ICD-10-CM

## 2021-09-25 LAB — CBC WITH DIFFERENTIAL (CANCER CENTER ONLY)
Abs Immature Granulocytes: 0.01 10*3/uL (ref 0.00–0.07)
Basophils Absolute: 0 10*3/uL (ref 0.0–0.1)
Basophils Relative: 1 %
Eosinophils Absolute: 0 10*3/uL (ref 0.0–0.5)
Eosinophils Relative: 2 %
HCT: 30 % — ABNORMAL LOW (ref 36.0–46.0)
Hemoglobin: 9.8 g/dL — ABNORMAL LOW (ref 12.0–15.0)
Immature Granulocytes: 0 %
Lymphocytes Relative: 46 %
Lymphs Abs: 1.2 10*3/uL (ref 0.7–4.0)
MCH: 33.2 pg (ref 26.0–34.0)
MCHC: 32.7 g/dL (ref 30.0–36.0)
MCV: 101.7 fL — ABNORMAL HIGH (ref 80.0–100.0)
Monocytes Absolute: 0.3 10*3/uL (ref 0.1–1.0)
Monocytes Relative: 12 %
Neutro Abs: 1 10*3/uL — ABNORMAL LOW (ref 1.7–7.7)
Neutrophils Relative %: 39 %
Platelet Count: 120 10*3/uL — ABNORMAL LOW (ref 150–400)
RBC: 2.95 MIL/uL — ABNORMAL LOW (ref 3.87–5.11)
RDW: 17.8 % — ABNORMAL HIGH (ref 11.5–15.5)
WBC Count: 2.6 10*3/uL — ABNORMAL LOW (ref 4.0–10.5)
nRBC: 0 % (ref 0.0–0.2)

## 2021-09-25 LAB — CMP (CANCER CENTER ONLY)
ALT: 16 U/L (ref 0–44)
AST: 29 U/L (ref 15–41)
Albumin: 3.8 g/dL (ref 3.5–5.0)
Alkaline Phosphatase: 45 U/L (ref 38–126)
Anion gap: 8 (ref 5–15)
BUN: 19 mg/dL (ref 8–23)
CO2: 27 mmol/L (ref 22–32)
Calcium: 9.2 mg/dL (ref 8.9–10.3)
Chloride: 106 mmol/L (ref 98–111)
Creatinine: 0.94 mg/dL (ref 0.44–1.00)
GFR, Estimated: >60 mL/min (ref >60)
Glucose, Bld: 144 mg/dL — ABNORMAL HIGH (ref 70–99)
Potassium: 3.8 mmol/L (ref 3.5–5.1)
Sodium: 141 mmol/L (ref 135–145)
Total Bilirubin: 0.7 mg/dL (ref 0.3–1.2)
Total Protein: 6.8 g/dL (ref 6.5–8.1)

## 2021-09-25 MED ORDER — SODIUM CHLORIDE 0.9% FLUSH
10.0000 mL | Freq: Once | INTRAVENOUS | Status: AC
  Administered 2021-09-25: 10 mL

## 2021-09-25 MED ORDER — HEPARIN SOD (PORK) LOCK FLUSH 100 UNIT/ML IV SOLN
500.0000 [IU] | Freq: Once | INTRAVENOUS | Status: AC
  Administered 2021-09-25: 500 [IU]

## 2021-09-25 NOTE — Progress Notes (Signed)
No treatment with ANC 1.0 per Dr. Alen Blew. Patient is aware of upcoming appointments and has the number for central scheduling to schedule the PET scan.

## 2021-09-30 DIAGNOSIS — Z905 Acquired absence of kidney: Secondary | ICD-10-CM | POA: Diagnosis not present

## 2021-09-30 DIAGNOSIS — C678 Malignant neoplasm of overlapping sites of bladder: Secondary | ICD-10-CM | POA: Diagnosis not present

## 2021-09-30 DIAGNOSIS — C652 Malignant neoplasm of left renal pelvis: Secondary | ICD-10-CM | POA: Diagnosis not present

## 2021-10-01 ENCOUNTER — Telehealth: Payer: Self-pay | Admitting: Oncology

## 2021-10-01 NOTE — Telephone Encounter (Signed)
Called patient regarding upcoming October appointments, patient has been called and voicemail was left. 

## 2021-10-03 NOTE — Progress Notes (Signed)
[employer] Summary Report / Loop Recorder 

## 2021-10-06 ENCOUNTER — Other Ambulatory Visit: Payer: Self-pay

## 2021-10-06 ENCOUNTER — Emergency Department (HOSPITAL_COMMUNITY): Payer: Medicare PPO

## 2021-10-06 ENCOUNTER — Inpatient Hospital Stay (HOSPITAL_COMMUNITY): Payer: Medicare PPO

## 2021-10-06 ENCOUNTER — Encounter (HOSPITAL_COMMUNITY): Payer: Self-pay

## 2021-10-06 ENCOUNTER — Inpatient Hospital Stay (HOSPITAL_COMMUNITY)
Admission: EM | Admit: 2021-10-06 | Discharge: 2021-10-08 | DRG: 193 | Disposition: A | Discharge status: Home / Self Care | Payer: Medicare PPO | Attending: Internal Medicine | Admitting: Internal Medicine

## 2021-10-06 DIAGNOSIS — R062 Wheezing: Secondary | ICD-10-CM | POA: Diagnosis not present

## 2021-10-06 DIAGNOSIS — Z923 Personal history of irradiation: Secondary | ICD-10-CM | POA: Diagnosis not present

## 2021-10-06 DIAGNOSIS — I4510 Unspecified right bundle-branch block: Secondary | ICD-10-CM | POA: Diagnosis present

## 2021-10-06 DIAGNOSIS — Z20822 Contact with and (suspected) exposure to covid-19: Secondary | ICD-10-CM | POA: Diagnosis present

## 2021-10-06 DIAGNOSIS — Z85528 Personal history of other malignant neoplasm of kidney: Secondary | ICD-10-CM

## 2021-10-06 DIAGNOSIS — Z8249 Family history of ischemic heart disease and other diseases of the circulatory system: Secondary | ICD-10-CM | POA: Diagnosis not present

## 2021-10-06 DIAGNOSIS — I480 Paroxysmal atrial fibrillation: Secondary | ICD-10-CM | POA: Diagnosis not present

## 2021-10-06 DIAGNOSIS — E785 Hyperlipidemia, unspecified: Secondary | ICD-10-CM | POA: Diagnosis present

## 2021-10-06 DIAGNOSIS — R110 Nausea: Secondary | ICD-10-CM | POA: Diagnosis not present

## 2021-10-06 DIAGNOSIS — C50211 Malignant neoplasm of upper-inner quadrant of right female breast: Secondary | ICD-10-CM

## 2021-10-06 DIAGNOSIS — C679 Malignant neoplasm of bladder, unspecified: Secondary | ICD-10-CM | POA: Diagnosis present

## 2021-10-06 DIAGNOSIS — I341 Nonrheumatic mitral (valve) prolapse: Secondary | ICD-10-CM | POA: Diagnosis present

## 2021-10-06 DIAGNOSIS — E039 Hypothyroidism, unspecified: Secondary | ICD-10-CM | POA: Diagnosis present

## 2021-10-06 DIAGNOSIS — D539 Nutritional anemia, unspecified: Secondary | ICD-10-CM | POA: Diagnosis present

## 2021-10-06 DIAGNOSIS — K589 Irritable bowel syndrome without diarrhea: Secondary | ICD-10-CM | POA: Diagnosis present

## 2021-10-06 DIAGNOSIS — J189 Pneumonia, unspecified organism: Secondary | ICD-10-CM | POA: Diagnosis not present

## 2021-10-06 DIAGNOSIS — K219 Gastro-esophageal reflux disease without esophagitis: Secondary | ICD-10-CM

## 2021-10-06 DIAGNOSIS — Z7401 Bed confinement status: Secondary | ICD-10-CM

## 2021-10-06 DIAGNOSIS — I472 Ventricular tachycardia, unspecified: Secondary | ICD-10-CM | POA: Diagnosis present

## 2021-10-06 DIAGNOSIS — Z803 Family history of malignant neoplasm of breast: Secondary | ICD-10-CM

## 2021-10-06 DIAGNOSIS — Z95810 Presence of automatic (implantable) cardiac defibrillator: Secondary | ICD-10-CM

## 2021-10-06 DIAGNOSIS — F32A Depression, unspecified: Secondary | ICD-10-CM | POA: Diagnosis present

## 2021-10-06 DIAGNOSIS — I440 Atrioventricular block, first degree: Secondary | ICD-10-CM | POA: Diagnosis present

## 2021-10-06 DIAGNOSIS — G4700 Insomnia, unspecified: Secondary | ICD-10-CM | POA: Diagnosis present

## 2021-10-06 DIAGNOSIS — Z7901 Long term (current) use of anticoagulants: Secondary | ICD-10-CM

## 2021-10-06 DIAGNOSIS — R509 Fever, unspecified: Secondary | ICD-10-CM | POA: Diagnosis not present

## 2021-10-06 DIAGNOSIS — E871 Hypo-osmolality and hyponatremia: Secondary | ICD-10-CM | POA: Diagnosis not present

## 2021-10-06 DIAGNOSIS — E876 Hypokalemia: Secondary | ICD-10-CM

## 2021-10-06 DIAGNOSIS — R0602 Shortness of breath: Secondary | ICD-10-CM | POA: Diagnosis not present

## 2021-10-06 DIAGNOSIS — F419 Anxiety disorder, unspecified: Secondary | ICD-10-CM | POA: Diagnosis present

## 2021-10-06 DIAGNOSIS — G2581 Restless legs syndrome: Secondary | ICD-10-CM | POA: Diagnosis not present

## 2021-10-06 DIAGNOSIS — Z853 Personal history of malignant neoplasm of breast: Secondary | ICD-10-CM

## 2021-10-06 DIAGNOSIS — I3139 Other pericardial effusion (noninflammatory): Secondary | ICD-10-CM | POA: Diagnosis not present

## 2021-10-06 DIAGNOSIS — R531 Weakness: Secondary | ICD-10-CM | POA: Diagnosis not present

## 2021-10-06 DIAGNOSIS — Z170 Estrogen receptor positive status [ER+]: Secondary | ICD-10-CM

## 2021-10-06 DIAGNOSIS — I10 Essential (primary) hypertension: Secondary | ICD-10-CM | POA: Diagnosis present

## 2021-10-06 DIAGNOSIS — Z79811 Long term (current) use of aromatase inhibitors: Secondary | ICD-10-CM

## 2021-10-06 DIAGNOSIS — E119 Type 2 diabetes mellitus without complications: Secondary | ICD-10-CM

## 2021-10-06 DIAGNOSIS — M1990 Unspecified osteoarthritis, unspecified site: Secondary | ICD-10-CM | POA: Diagnosis present

## 2021-10-06 DIAGNOSIS — Z79890 Hormone replacement therapy (postmenopausal): Secondary | ICD-10-CM

## 2021-10-06 DIAGNOSIS — J9601 Acute respiratory failure with hypoxia: Secondary | ICD-10-CM | POA: Diagnosis present

## 2021-10-06 DIAGNOSIS — J121 Respiratory syncytial virus pneumonia: Secondary | ICD-10-CM | POA: Diagnosis not present

## 2021-10-06 DIAGNOSIS — C689 Malignant neoplasm of urinary organ, unspecified: Secondary | ICD-10-CM | POA: Diagnosis present

## 2021-10-06 DIAGNOSIS — Z79899 Other long term (current) drug therapy: Secondary | ICD-10-CM

## 2021-10-06 DIAGNOSIS — Z8701 Personal history of pneumonia (recurrent): Secondary | ICD-10-CM

## 2021-10-06 LAB — RESP PANEL BY RT-PCR (FLU A&B, COVID) ARPGX2
Influenza A by PCR: NEGATIVE
Influenza B by PCR: NEGATIVE
SARS Coronavirus 2 by RT PCR: NEGATIVE

## 2021-10-06 LAB — CBC WITH DIFFERENTIAL/PLATELET
Abs Immature Granulocytes: 0.02 10*3/uL (ref 0.00–0.07)
Basophils Absolute: 0 10*3/uL (ref 0.0–0.1)
Basophils Relative: 0 %
Eosinophils Absolute: 0 10*3/uL (ref 0.0–0.5)
Eosinophils Relative: 0 %
HCT: 29.7 % — ABNORMAL LOW (ref 36.0–46.0)
Hemoglobin: 9.5 g/dL — ABNORMAL LOW (ref 12.0–15.0)
Immature Granulocytes: 1 %
Lymphocytes Relative: 22 %
Lymphs Abs: 0.7 10*3/uL (ref 0.7–4.0)
MCH: 33.3 pg (ref 26.0–34.0)
MCHC: 32 g/dL (ref 30.0–36.0)
MCV: 104.2 fL — ABNORMAL HIGH (ref 80.0–100.0)
Monocytes Absolute: 0.3 10*3/uL (ref 0.1–1.0)
Monocytes Relative: 11 %
Neutro Abs: 2.1 10*3/uL (ref 1.7–7.7)
Neutrophils Relative %: 66 %
Platelets: 210 10*3/uL (ref 150–400)
RBC: 2.85 MIL/uL — ABNORMAL LOW (ref 3.87–5.11)
RDW: 18 % — ABNORMAL HIGH (ref 11.5–15.5)
WBC: 3.1 10*3/uL — ABNORMAL LOW (ref 4.0–10.5)
nRBC: 0 % (ref 0.0–0.2)

## 2021-10-06 LAB — MRSA NEXT GEN BY PCR, NASAL: MRSA by PCR Next Gen: NOT DETECTED

## 2021-10-06 LAB — COMPREHENSIVE METABOLIC PANEL
ALT: 17 U/L (ref 0–44)
AST: 34 U/L (ref 15–41)
Albumin: 3.6 g/dL (ref 3.5–5.0)
Alkaline Phosphatase: 41 U/L (ref 38–126)
Anion gap: 8 (ref 5–15)
BUN: 13 mg/dL (ref 8–23)
CO2: 24 mmol/L (ref 22–32)
Calcium: 7.7 mg/dL — ABNORMAL LOW (ref 8.9–10.3)
Chloride: 98 mmol/L (ref 98–111)
Creatinine, Ser: 0.81 mg/dL (ref 0.44–1.00)
GFR, Estimated: >60 mL/min (ref >60)
Glucose, Bld: 109 mg/dL — ABNORMAL HIGH (ref 70–99)
Potassium: 3.2 mmol/L — ABNORMAL LOW (ref 3.5–5.1)
Sodium: 130 mmol/L — ABNORMAL LOW (ref 135–145)
Total Bilirubin: 0.8 mg/dL (ref 0.3–1.2)
Total Protein: 6.7 g/dL (ref 6.5–8.1)

## 2021-10-06 LAB — MAGNESIUM: Magnesium: 1.5 mg/dL — ABNORMAL LOW (ref 1.7–2.4)

## 2021-10-06 LAB — LACTIC ACID, PLASMA: Lactic Acid, Venous: 1 mmol/L (ref 0.5–1.9)

## 2021-10-06 MED ORDER — SODIUM CHLORIDE 0.9 % IV SOLN: Freq: Once | INTRAVENOUS | Status: AC

## 2021-10-06 MED ORDER — ACETAMINOPHEN 650 MG RE SUPP: 650.0000 mg | Freq: Four times a day (QID) | RECTAL | Status: DC | PRN

## 2021-10-06 MED ORDER — SODIUM CHLORIDE 0.9% FLUSH: 10.0000 mL | INTRAVENOUS | Status: DC | PRN

## 2021-10-06 MED ORDER — CHLORHEXIDINE GLUCONATE CLOTH 2 % EX PADS
6.0000 | MEDICATED_PAD | Freq: Every day | CUTANEOUS | Status: DC
  Administered 2021-10-07 – 2021-10-08 (×2): 6 via TOPICAL

## 2021-10-06 MED ORDER — CALCIUM GLUCONATE-NACL 1-0.675 GM/50ML-% IV SOLN
1.0000 g | Freq: Once | INTRAVENOUS | Status: AC
  Administered 2021-10-06: 1000 mg via INTRAVENOUS
  Filled 2021-10-06: qty 50

## 2021-10-06 MED ORDER — GUAIFENESIN ER 600 MG PO TB12
600.0000 mg | ORAL_TABLET | Freq: Two times a day (BID) | ORAL | Status: DC
  Administered 2021-10-06 – 2021-10-08 (×4): 600 mg via ORAL
  Filled 2021-10-06 (×4): qty 1

## 2021-10-06 MED ORDER — LOSARTAN POTASSIUM 25 MG PO TABS
25.0000 mg | ORAL_TABLET | Freq: Once | ORAL | Status: AC
  Administered 2021-10-06: 25 mg via ORAL
  Filled 2021-10-06: qty 1

## 2021-10-06 MED ORDER — ONDANSETRON HCL 4 MG/2ML IJ SOLN: 4.0000 mg | Freq: Four times a day (QID) | INTRAMUSCULAR | Status: DC | PRN

## 2021-10-06 MED ORDER — METHYLPREDNISOLONE SODIUM SUCC 40 MG IJ SOLR
40.0000 mg | Freq: Two times a day (BID) | INTRAMUSCULAR | Status: DC
  Administered 2021-10-06 – 2021-10-08 (×4): 40 mg via INTRAVENOUS
  Filled 2021-10-06 (×4): qty 1

## 2021-10-06 MED ORDER — TRAZODONE HCL 50 MG PO TABS
50.0000 mg | ORAL_TABLET | Freq: Once | ORAL | Status: AC
  Administered 2021-10-06: 50 mg via ORAL
  Filled 2021-10-06: qty 1

## 2021-10-06 MED ORDER — POTASSIUM CHLORIDE CRYS ER 20 MEQ PO TBCR
40.0000 meq | EXTENDED_RELEASE_TABLET | Freq: Two times a day (BID) | ORAL | Status: AC
  Administered 2021-10-06 – 2021-10-07 (×2): 40 meq via ORAL
  Filled 2021-10-06 (×2): qty 2

## 2021-10-06 MED ORDER — IOHEXOL 300 MG/ML  SOLN
100.0000 mL | Freq: Once | INTRAMUSCULAR | Status: AC | PRN
  Administered 2021-10-06: 100 mL via INTRAVENOUS

## 2021-10-06 MED ORDER — IPRATROPIUM-ALBUTEROL 0.5-2.5 (3) MG/3ML IN SOLN
3.0000 mL | Freq: Once | RESPIRATORY_TRACT | Status: AC
  Administered 2021-10-06: 3 mL via RESPIRATORY_TRACT
  Filled 2021-10-06: qty 3

## 2021-10-06 MED ORDER — IPRATROPIUM-ALBUTEROL 0.5-2.5 (3) MG/3ML IN SOLN: 3.0000 mL | Freq: Four times a day (QID) | RESPIRATORY_TRACT | Status: DC

## 2021-10-06 MED ORDER — IPRATROPIUM-ALBUTEROL 0.5-2.5 (3) MG/3ML IN SOLN
3.0000 mL | Freq: Four times a day (QID) | RESPIRATORY_TRACT | Status: DC
  Administered 2021-10-06 – 2021-10-07 (×5): 3 mL via RESPIRATORY_TRACT
  Filled 2021-10-06 (×5): qty 3

## 2021-10-06 MED ORDER — ACETAMINOPHEN 325 MG PO TABS: 650.0000 mg | ORAL_TABLET | Freq: Four times a day (QID) | ORAL | Status: DC | PRN

## 2021-10-06 MED ORDER — SODIUM CHLORIDE 0.9 % IV SOLN
2.0000 g | Freq: Once | INTRAVENOUS | Status: AC
  Administered 2021-10-06: 2 g via INTRAVENOUS
  Filled 2021-10-06: qty 12.5

## 2021-10-06 MED ORDER — ONDANSETRON HCL 4 MG PO TABS: 4.0000 mg | ORAL_TABLET | Freq: Four times a day (QID) | ORAL | Status: DC | PRN

## 2021-10-06 MED ORDER — APIXABAN 5 MG PO TABS
5.0000 mg | ORAL_TABLET | Freq: Two times a day (BID) | ORAL | Status: DC
  Administered 2021-10-06 – 2021-10-08 (×4): 5 mg via ORAL
  Filled 2021-10-06 (×4): qty 1

## 2021-10-06 MED ORDER — SODIUM CHLORIDE 0.9 % IV SOLN
2.0000 g | Freq: Two times a day (BID) | INTRAVENOUS | Status: DC
  Administered 2021-10-06: 2 g via INTRAVENOUS
  Filled 2021-10-06: qty 12.5

## 2021-10-06 NOTE — ED Provider Notes (Signed)
[facility] DEPT Provider Note   CSN: [id number] Arrival date & time: 10/06/21  1975     History  Chief Complaint  Patient presents with   Weakness    Cynthia [name] is a 76 y.o. female.   Weakness Patient presents with shortness of breath weakness and cough.  History of recurrent pneumonias.  Is on treatment for bladder cancer.  States she is been admitted twice in the last month for pneumonias.  More short of breath at home.  States everyone at home is sick.  States that has had lung issues with the infections but not prior to that.  History of atrial fibrillation and thinks she may be in atrial fibrillation.    Past Medical History:  Diagnosis Date   AICD (automatic cardioverter/defibrillator) present    [employer]   Anticoagulant long-term use    eliquis--- managed by cardiology   Anxiety    Arthritis    Bladder cancer ([place])    Bradycardia    Breast cancer ([place])    left, s/p lumpectomy, Dr [name]   Cancer of kidney [facility])    left   DDD (degenerative disc disease), lumbar    Depression    Dyspnea    ON EXERTION   First degree heart block    Genetic testing 06/25/2016   Ms. Beckum underwent genetic counseling and testing for hereditary cancer syndromes on 06/17/2016. Her results were negative for mutations in all 46 genes analyzed by [facility]'s 46-gene Common Hereditary Cancers Panel. Genes analyzed include: APC, ATM, AXIN2, BARD1, BMPR1A, BRCA1, BRCA2, BRIP1, CDH1, CDKN2A, CHEK2, CTNNA1, DICER1, EPCAM, GREM1, HOXB13, KIT, MEN1, MLH1, MSH2, MSH3, MSH6, MUTYH, NBN,   GERD (gastroesophageal reflux disease)    occasional,  takes pepcid   Hematuria    History of radiation therapy 05/22/2016 to 06/20/2016   right breast cancer   Hyperlipidemia    Hypertension    followd by pcp---  (02-22-2019 per pt never had stress test)   Hypothyroidism    followed by pcp   IBS (irritable bowel syndrome)    Incomplete right bundle branch block    Insomnia     Malignant neoplasm of upper-inner quadrant of right breast in female, estrogen receptor positive [facility]) oncologist--- dr [name]   dx 03/ 2018,  Stage IA, IDC, ER/PR +;  04-01-2016 s/p  right breast lumpectomy w/ node dissection's;  completed radiation 06-20-2016   MVP (mitral valve prolapse)    per echo 12-11-2018 in epic, mild    NSVT (nonsustained ventricular tachycardia) ([place]) followed by cardiology   12-10-2018  hospital admission ,  refer to discharge note 12-14-2018 for treatement   PAF (paroxysmal atrial fibrillation) [facility]) primary cardiologist--- dr Margaretann Loveless   newly dx 12-10-2018  admission in epic, Afib w/ RVR and NSVT;   in epic TTE 12-11-2018 showed ef 50-55%, mild MVP,  mild AV sclerosis without stenosis;   Cardiac MRI 12-13-2018 in epic   Prediabetes    Renal mass, left    pelvis    Restless leg syndrome    occasional   RLS (restless legs syndrome)    Type 2 diabetes mellitus ([place])    followed by pcp---  (02-22-2019 does not check blood sugar's)    Home Medications Prior to Admission medications   Medication Sig Start Date End Date Taking? Authorizing Provider  acetaminophen (TYLENOL) 500 MG tablet Take 1,000 mg by mouth at bedtime. May take an additional 1000 mg by mouth for headaches as needed    [provider]  albuterol (VENTOLIN HFA) 108 (90 Base) MCG/ACT inhaler Inhale 2 puffs into the lungs every 4 (four) hours as needed for wheezing or shortness of breath. Patient not taking: Reported on 09/13/2021 09/03/21   Aline August, MD  ALPRAZolam Duanne Moron) 1 MG tablet Take 1 mg by mouth at bedtime. 12/09/18   [provider]  amiodarone (PACERONE) 100 MG tablet Take 2 tablets (200 mg total) by mouth daily. 09/14/21   Dessa Phi, DO  anastrozole (ARIMIDEX) 1 MG tablet TAKE 1 TABLET BY MOUTH EVERY DAY Patient taking differently: Take 1 mg by mouth in the morning. 10/08/20   Magrinat, [name], MD  apixaban (ELIQUIS) 5 MG TABS tablet TAKE 1 TABLET BY MOUTH  TWICE A DAY 09/20/21   Deboraha Sprang, MD  cefdinir (OMNICEF) 300 MG capsule Take 1 capsule (300 mg total) by mouth 2 (two) times daily. Patient not taking: Reported on 09/19/2021 09/14/21   Dessa Phi, DO  Cholecalciferol (VITAMIN D-3) 25 MCG (1000 UT) CAPS Take 1,000 Units by mouth daily with breakfast.    [provider]  Choline Fenofibrate (FENOFIBRIC ACID) 135 MG CPDR Take 135 mg by mouth daily.  01/16/16   [provider]  docusate sodium (COLACE) 100 MG capsule Take 200 mg by mouth as needed for mild constipation.    [provider]  ferrous sulfate 325 (65 FE) MG tablet Take 325 mg by mouth at bedtime.    [provider]  FIBER PO Take 8 tablets by mouth daily.    [provider]  fluconazole (DIFLUCAN) 150 MG tablet Take 1 tablet (150 mg total) by mouth once as needed for up to 1 dose (yeast infection). Patient not taking: Reported on 09/19/2021 09/14/21   Dessa Phi, DO  levothyroxine (SYNTHROID, LEVOTHROID) 112 MCG tablet Take 112 mcg by mouth daily. 03/13/16   [provider]  lidocaine-prilocaine (EMLA) cream Apply 1 Application topically as needed. Patient taking differently: Apply 1 Application topically as needed (port access). 07/05/21   Wyatt Portela, MD  losartan (COZAAR) 25 MG tablet Take 25 mg by mouth daily. 09/17/21   [provider]  pantoprazole (PROTONIX) 40 MG tablet Take 40 mg by mouth 2 (two) times daily. 05/20/21   [provider]  prochlorperazine (COMPAZINE) 10 MG tablet Take 1 tablet (10 mg total) by mouth every 6 (six) hours as needed for nausea or vomiting. Patient taking differently: Take 10 mg by mouth as needed for nausea or vomiting. 07/05/21   Wyatt Portela, MD  traZODone (DESYREL) 50 MG tablet Take 1 tablet (50 mg total) by mouth at bedtime. 09/03/21   Aline August, MD      Allergies    Patient has no known allergies.    Review of Systems   Review of Systems  Neurological:   Positive for weakness.    Physical Exam Updated Vital Signs BP 107/66   Pulse 91   Temp 98.9 F (37.2 C) (Oral)   Resp 16   SpO2 93%  Physical Exam Vitals reviewed.  Cardiovascular:     Rate and Rhythm: Tachycardia present.  Pulmonary:     Breath sounds: Wheezing present.     Comments: Somewhat diffuse wheezes and prolonged expirations. Neurological:     Mental Status: She is alert.     ED Results / Procedures / Treatments   Labs (all labs ordered are listed, but only abnormal results are displayed) Labs Reviewed  COMPREHENSIVE METABOLIC PANEL - Abnormal; Notable for  the following components:      Result Value   Sodium 130 (*)    Potassium 3.2 (*)    Glucose, Bld 109 (*)    Calcium 7.7 (*)    All other components within normal limits  CBC WITH DIFFERENTIAL/PLATELET - Abnormal; Notable for the following components:   WBC 3.1 (*)    RBC 2.85 (*)    Hemoglobin 9.5 (*)    HCT 29.7 (*)    MCV 104.2 (*)    RDW 18.0 (*)    All other components within normal limits  RESP PANEL BY RT-PCR (FLU A&B, COVID) ARPGX2  CULTURE, BLOOD (ROUTINE X 2)  CULTURE, BLOOD (ROUTINE X 2)  LACTIC ACID, PLASMA    EKG EKG Interpretation  Date/Time:  Sunday October 06 2021 10:03:05 EDT Ventricular Rate:  108 PR Interval:    QRS Duration: 106 QT Interval:  341 QTC Calculation: 457 R Axis:   -51 Text Interpretation: Atrial fibrillation S1,S2,S3 pattern Abnormal R-wave progression, late transition Borderline repolarization abnormality Confirmed by Davonna Belling [phone]) on 10/06/2021 10:23:48 AM  Radiology DG Chest Portable 1 View  Result Date: 10/06/2021 CLINICAL DATA:  Shortness of breath. EXAM: PORTABLE CHEST 1 VIEW COMPARISON:  Chest two views 09/12/2021, AP chest 08/31/2021, chest two views 08/19/2021; CTA chest 09/12/2021 FINDINGS: Right chest wall porta catheter tip overlies the central superior vena cava. Surgical clips overlie the right costophrenic angle, unchanged. Cardiac  loop recorder again overlies the left hemithorax. Cardiac silhouette is again mildly enlarged. Mediastinal contours are within normal limits with mild-to-moderate calcifications within the aortic arch. There are again patchy airspace opacities within the right mid and lower lung and left lower lung, similar to 09/12/2021 no definite pleural effusion. No pneumothorax. No acute skeletal abnormality. IMPRESSION: No significant change in patchy nodular like opacities within the right greater than left lower lungs. These are very similar to prior 09/12/2021 hand August 11, 2021 chest radiographs and 09/12/2021 CT. Again this may represent infection/inflammation. Note is made on 09/12/2021 CT there was recommendation for a three-month follow-up CT around 12/12/2021 in this patient with history of invasive bladder carcinoma. Recommend attention on follow-up. Electronically Signed   By: Yvonne Kendall M.D.   On: 10/06/2021 11:07    Procedures Procedures    Medications Ordered in ED Medications  ipratropium-albuterol (DUONEB) 0.5-2.5 (3) MG/3ML nebulizer solution 3 mL (3 mLs Nebulization Given 10/06/21 1045)    ED Course/ Medical Decision Making/ A&P                           Medical Decision Making Amount and/or Complexity of Data Reviewed Labs: ordered. Radiology: ordered.  Risk Prescription drug management.   Patient with shortness of breath cough and fatigue.  Diffuse wheezes.  Has had sick contacts.  Afebrile here.  However was hypoxic on room air.  X-ray independently interpreted and stable to prior with potential multifocal pneumonia.  Also previous CT similar.  White count mildly low but appears somewhat stable to where it has been.  Also anemia with hemoglobin of 9 also similar to prior.  Normal lactic acid.  With hypoxia likely require admission to the hospital.  Lab work overall reassuring.  Continues to have oxygen requirement although less wheezing than before.  X-ray stable and will give  antibiotics.  Doubt pulm embolism.  Has had steroids by EMS.  A-fib but rate controlled.  Will discuss with hospitalist for admission.  Final Clinical Impression(s) / ED Diagnoses Final diagnoses:  Acute respiratory failure with hypoxia [facility])    Rx / DC Orders ED Discharge Orders     None         Davonna Belling, MD 10/06/21 1418

## 2021-10-06 NOTE — Progress Notes (Signed)
Pharmacy Antibiotic Note  Cynthia [name] is a 76 y.o. female admitted on 10/06/2021. Pharmacy has been consulted for cefepime dosing for pneumonia.  Plan: Cefepime 2 g IV q12h     Temp (24hrs), Avg:98.9 F (37.2 C), Min:98.9 F (37.2 C), Max:98.9 F (37.2 C)  Recent Labs  Lab 10/06/21 1048 10/06/21 1049  WBC  --  3.1*  CREATININE  --  0.81  LATICACIDVEN 1.0  --     Estimated Creatinine Clearance: 58 mL/min (by C-G formula based on SCr of 0.81 mg/dL).    No Known Allergies  Antimicrobials this admission: Cefepime 10/1 >>  Microbiology results: 10/1 BCx: pending 10/1 Resp panel: ordered 10/1 MRSA PCR: ordered  Thank you for allowing pharmacy to be a part of this patient's care.  Tawnya Crook, PharmD, BCPS Clinical Pharmacist 10/06/2021 4:37 PM

## 2021-10-06 NOTE — H&P (Signed)
History and Physical    PatientIlisha [name] IOX:[id number] DOB: March 20, 1945 DOA: 10/06/2021 DOS: the patient was seen and examined on 10/06/2021 PCP: Cynthia Halsted, FNP  Patient coming from: Home  Chief Complaint:  Chief Complaint  Patient presents with   Weakness   HPI: Cynthia [name] is a 76 y.o. female with medical history significant of PAF on eliquis, urothelial carcinoma, DM2, HLD, HTN, hypothyroidism, NSVT. Presenting with weakness and shortness of breath. For the last week or so, she has felt more short of breath. She has had fever as high as 100.5. She has had some non-productive cough and some wheeze. Last night and again this morning, she felt tremors and generally felt like she couldn't function. When her symptoms did not improve this morning, she decided to come to the ED for evaluation. She denies any other aggravating or alleviating factors.   Review of Systems: As mentioned in the history of present illness. All other systems reviewed and are negative. Past Medical History:  Diagnosis Date   AICD (automatic cardioverter/defibrillator) present    Medtronic   Anticoagulant long-term use    eliquis--- managed by cardiology   Anxiety    Arthritis    Bladder cancer ([place])    Bradycardia    Breast cancer ([place])    left, s/p lumpectomy, Dr Cynthia [name]   Cancer of kidney [facility])    left   DDD (degenerative disc disease), lumbar    Depression    Dyspnea    ON EXERTION   First degree heart block    Genetic testing 06/25/2016   Ms. Vecchio underwent genetic counseling and testing for hereditary cancer syndromes on 06/17/2016. Her results were negative for mutations in all 46 genes analyzed by [facility]'s 46-gene Common Hereditary Cancers Panel. Genes analyzed include: APC, ATM, AXIN2, BARD1, BMPR1A, BRCA1, BRCA2, BRIP1, CDH1, CDKN2A, CHEK2, CTNNA1, DICER1, EPCAM, GREM1, HOXB13, KIT, MEN1, MLH1, MSH2, MSH3, MSH6, MUTYH, NBN,   GERD (gastroesophageal reflux disease)    occasional,   takes pepcid   Hematuria    History of radiation therapy 05/22/2016 to 06/20/2016   right breast cancer   Hyperlipidemia    Hypertension    followd by pcp---  (02-22-2019 per pt never had stress test)   Hypothyroidism    followed by pcp   IBS (irritable bowel syndrome)    Incomplete right bundle branch block    Insomnia    Malignant neoplasm of upper-inner quadrant of right breast in female, estrogen receptor positive [facility]) oncologist--- dr Cynthia [name]   dx 03/ 2018,  Stage IA, IDC, ER/PR +;  04-01-2016 s/p  right breast lumpectomy w/ node dissection's;  completed radiation 06-20-2016   MVP (mitral valve prolapse)    per echo 12-11-2018 in epic, mild    NSVT (nonsustained ventricular tachycardia) ([place]) followed by cardiology   12-10-2018  hospital admission ,  refer to discharge note 12-14-2018 for treatement   PAF (paroxysmal atrial fibrillation) [facility]) primary cardiologist--- dr Cynthia [name]   newly dx 12-10-2018  admission in epic, Afib w/ RVR and NSVT;   in epic TTE 12-11-2018 showed ef 50-55%, mild MVP,  mild AV sclerosis without stenosis;   Cardiac MRI 12-13-2018 in epic   Prediabetes    Renal mass, left    pelvis    Restless leg syndrome    occasional   RLS (restless legs syndrome)    Type 2 diabetes mellitus ([place])    followed by pcp---  (02-22-2019 does not check blood sugar's)   Past Surgical History:  Procedure Laterality Date   BREAST LUMPECTOMY WITH RADIOACTIVE SEED AND SENTINEL LYMPH NODE BIOPSY Right 04/01/2016   Procedure: RADIOACTIVE SEED GUIDED RIGHT BREAST LUMPECTOMY WITH RIGHT AXILLARY SENTINEL LYMPH NODE BIOPSY.;  Surgeon: Cynthia Skates, MD;  Location: [place];  Service: General;  Laterality: Right;   CARDIAC CATHETERIZATION  12/11/2018   CATARACT EXTRACTION W/ INTRAOCULAR LENS  IMPLANT, BILATERAL  1980s   COLONOSCOPY     CYSTOSCOPY W/ RETROGRADES Right 06/05/2021   Procedure: CYSTOSCOPY WITH RETROGRADE PYELOGRAM;  Surgeon: Cynthia Frock, MD;  Location: [facility];  Service:  Urology;  Laterality: Right;   CYSTOSCOPY/RETROGRADE/URETEROSCOPY N/A 03/02/2019   Procedure: CYSTOSCOPY/LEFT RETROGRADE/URETEROSCOPY/  BIOPSY/  STENT;  Surgeon: Cynthia Mons, MD;  Location[facility];  Service: Urology;  Laterality: N/A;   INCISIONAL HERNIA REPAIR N/A 03/29/2020   Procedure: REPAIR OF INCISIONAL HERNIA WITH MESH;  Surgeon: Cynthia Luna, MD;  Location: [place];  Service: General;  Laterality: N/A;   INSERTION OF ICD     medtronic   IR IMAGING GUIDED PORT INSERTION  07/18/2021   LAPAROSCOPIC CHOLECYSTECTOMY  1990s   ROBOT ASSITED LAPAROSCOPIC NEPHROURETERECTOMY Left 04/27/2019   Procedure: XI ROBOT ASSITED LAPAROSCOPIC NEPHROURETERECTOMY;  Surgeon: Cynthia Frock, MD;  Location: [facility];  Service: Urology;  Laterality: Left;  3.5 HRS   TONSILLECTOMY  age 60   TRANSURETHRAL RESECTION OF BLADDER TUMOR N/A 06/05/2021   Procedure: TRANSURETHRAL RESECTION OF BLADDER TUMOR (TURBT);  Surgeon: Cynthia Frock, MD;  Location: [facility];  Service: Urology;  Laterality: N/A;   Social History:  reports that she has never smoked. She has never used smokeless tobacco. She reports that she does not currently use alcohol. She reports that she does not use drugs.  No Known Allergies  Family History  Problem Relation Age of Onset   Congestive Heart Failure Mother    Breast cancer Sister 54       d.54   Cervical cancer Sister 88   Leukemia Brother 43   Leukemia Son    Ovarian cancer Maternal Aunt 92       d.92s   Breast cancer Other 45   Restless legs syndrome Neg Hx    Sleep apnea Neg Hx     Prior to Admission medications   Medication Sig Start Date End Date Taking? Authorizing Provider  acetaminophen (TYLENOL) 500 MG tablet Take 1,000 mg by mouth at bedtime. May take an additional 1000 mg by mouth for headaches as needed    [provider]  albuterol (VENTOLIN HFA) 108 (90 Base) MCG/ACT inhaler Inhale 2 puffs into the lungs every 4 (four) hours as  needed for wheezing or shortness of breath. Patient not taking: Reported on 09/13/2021 09/03/21   Aline August, MD  ALPRAZolam Duanne Moron) 1 MG tablet Take 1 mg by mouth at bedtime. 12/09/18   [provider]  amiodarone (PACERONE) 100 MG tablet Take 2 tablets (200 mg total) by mouth daily. 09/14/21   Dessa Phi, DO  anastrozole (ARIMIDEX) 1 MG tablet TAKE 1 TABLET BY MOUTH EVERY DAY Patient taking differently: Take 1 mg by mouth in the morning. 10/08/20   Magrinat, [name], MD  apixaban (ELIQUIS) 5 MG TABS tablet TAKE 1 TABLET BY MOUTH TWICE A DAY 09/20/21   Deboraha Sprang, MD  cefdinir (OMNICEF) 300 MG capsule Take 1 capsule (300 mg total) by mouth 2 (two) times daily. Patient not taking: Reported on 09/19/2021 09/14/21   Dessa Phi, DO  Cholecalciferol (VITAMIN D-3) 25 MCG (1000 UT)  CAPS Take 1,000 Units by mouth daily with breakfast.    [provider]  Choline Fenofibrate (FENOFIBRIC ACID) 135 MG CPDR Take 135 mg by mouth daily.  01/16/16   [provider]  docusate sodium (COLACE) 100 MG capsule Take 200 mg by mouth as needed for mild constipation.    [provider]  ferrous sulfate 325 (65 FE) MG tablet Take 325 mg by mouth at bedtime.    [provider]  FIBER PO Take 8 tablets by mouth daily.    [provider]  fluconazole (DIFLUCAN) 150 MG tablet Take 1 tablet (150 mg total) by mouth once as needed for up to 1 dose (yeast infection). Patient not taking: Reported on 09/19/2021 09/14/21   Dessa Phi, DO  levothyroxine (SYNTHROID, LEVOTHROID) 112 MCG tablet Take 112 mcg by mouth daily. 03/13/16   [provider]  lidocaine-prilocaine (EMLA) cream Apply 1 Application topically as needed. Patient taking differently: Apply 1 Application topically as needed (port access). 07/05/21   Wyatt Portela, MD  losartan (COZAAR) 25 MG tablet Take 25 mg by mouth daily. 09/17/21   [provider]  pantoprazole (PROTONIX) 40 MG tablet  Take 40 mg by mouth 2 (two) times daily. 05/20/21   [provider]  prochlorperazine (COMPAZINE) 10 MG tablet Take 1 tablet (10 mg total) by mouth every 6 (six) hours as needed for nausea or vomiting. Patient taking differently: Take 10 mg by mouth as needed for nausea or vomiting. 07/05/21   Wyatt Portela, MD  traZODone (DESYREL) 50 MG tablet Take 1 tablet (50 mg total) by mouth at bedtime. 09/03/21   Aline August, MD    Physical Exam: Vitals:   10/06/21 1445 10/06/21 1500 10/06/21 1515 10/06/21 1530  BP: 139/77 (!) 144/76 138/74 137/77  Pulse: 91 93 93 89  Resp: _0 Temp:      TempSrc:      SpO2: 94% 97% 97% 95%   General: 76 y.o. female resting in bed in NAD Eyes: PERRL, normal sclera ENMT: Nares patent w/o discharge, orophaynx clear, dentition normal, ears w/o discharge/lesions/ulcers Neck: Supple, trachea midline Cardiovascular: RRR, +S1, S2, no m/g/r, equal pulses throughout Respiratory: course sounds w/ scattered exp wheeze, slightly increased WOB on 4L [place] GI: BS+, NDNT, no masses noted, no organomegaly noted MSK: No e/c/c Neuro: A&O x 3, no focal deficits Psyc: Appropriate interaction and affect, calm/cooperative  Data Reviewed:  Results for orders placed or performed during the hospital encounter of 10/06/21 (from the past 24 hour(s))  Resp Panel by RT-PCR (Flu A&B, Covid) Anterior Nasal Swab     Status: None   Collection Time: 10/06/21 10:47 AM   Specimen: Anterior Nasal Swab  Result Value Ref Range   SARS Coronavirus 2 by RT PCR NEGATIVE NEGATIVE   Influenza A by PCR NEGATIVE NEGATIVE   Influenza B by PCR NEGATIVE NEGATIVE  Lactic acid, plasma     Status: None   Collection Time: 10/06/21 10:48 AM  Result Value Ref Range   Lactic Acid, Venous 1.0 0.5 - 1.9 mmol/L  Comprehensive metabolic panel     Status: Abnormal   Collection Time: 10/06/21 10:49 AM  Result Value Ref Range   Sodium 130 (L) 135 - 145 mmol/L   Potassium 3.2 (L) 3.5 - 5.1  mmol/L   Chloride 98 98 - 111 mmol/L   CO2 24 22 - 32 mmol/L   Glucose, Bld 109 (H) 70 - 99 mg/dL   BUN  13 8 - 23 mg/dL   Creatinine, Ser 0.81 0.44 - 1.00 mg/dL   Calcium 7.7 (L) 8.9 - 10.3 mg/dL   Total Protein 6.7 6.5 - 8.1 g/dL   Albumin 3.6 3.5 - 5.0 g/dL   AST 34 15 - 41 U/L   ALT 17 0 - 44 U/L   Alkaline Phosphatase 41 38 - 126 U/L   Total Bilirubin 0.8 0.3 - 1.2 mg/dL   GFR, Estimated >60 >60 mL/min   Anion gap 8 5 - 15  CBC with Differential     Status: Abnormal   Collection Time: 10/06/21 10:49 AM  Result Value Ref Range   WBC 3.1 (L) 4.0 - 10.5 K/uL   RBC 2.85 (L) 3.87 - 5.11 MIL/uL   Hemoglobin 9.5 (L) 12.0 - 15.0 g/dL   HCT 29.7 (L) 36.0 - 46.0 %   MCV 104.2 (H) 80.0 - 100.0 fL   MCH 33.3 26.0 - 34.0 pg   MCHC 32.0 30.0 - 36.0 g/dL   RDW 18.0 (H) 11.5 - 15.5 %   Platelets 210 150 - 400 K/uL   nRBC 0.0 0.0 - 0.2 %   Neutrophils Relative % 66 %   Neutro Abs 2.1 1.7 - 7.7 K/uL   Lymphocytes Relative 22 %   Lymphs Abs 0.7 0.7 - 4.0 K/uL   Monocytes Relative 11 %   Monocytes Absolute 0.3 0.1 - 1.0 K/uL   Eosinophils Relative 0 %   Eosinophils Absolute 0.0 0.0 - 0.5 K/uL   Basophils Relative 0 %   Basophils Absolute 0.0 0.0 - 0.1 K/uL   WBC Morphology VACUOLATED NEUTROPHILS    Immature Granulocytes 1 %   Abs Immature Granulocytes 0.02 0.00 - 0.07 K/uL   CXR: No significant change in patchy nodular like opacities within the right greater than left lower lungs. These are very similar to prior 09/12/2021 hand August 11, 2021 chest radiographs and 09/12/2021 CT. Again this may represent infection/inflammation. Note is made on 09/12/2021 CT there was recommendation for a three-month follow-up CT around 12/12/2021 in this patient with history of invasive bladder carcinoma. Recommend attention on follow-up.  Assessment and Plan: Multifocal PNA Acute hypoxic respiratory failure     - admit to inpt, tele     - wean O2 as able     - continue cefepime; add nebs,  steroids     - check MRSA swab, RVP; she's COVID/flu negative     - check CT chest  Hx of Urothelial Carcinoma Hx of Breast CA     - continue follow up w/ Dr. Alen Blew  PAF     - continue home regimen when confirmed  DM2     - Diet controlled     - daily glucose checks for now     - check A1c  Hypothyroidism     - continue home regimen when confirmed  GERD      - PPI  Hypokalemia Hypocalcemia Hyponatremia     - replace K+; check Mg2+     - replace Ca2+; check PTH, vit d     - Na+ is 130, fluids, follow  Macrocytic anemia     - she is at baseline, follow  Advance Care Planning:   Code Status: DNI (confirmed through multiple lines of questioning)  Consults: None  Family Communication: w/ husband at bedside  Severity of Illness: The appropriate patient status for this patient is INPATIENT. Inpatient status is judged to be reasonable and necessary in order to  provide the required intensity of service to ensure the patient's safety. The patient's presenting symptoms, physical exam findings, and initial radiographic and laboratory data in the context of their chronic comorbidities is felt to place them at high risk for further clinical deterioration. Furthermore, it is not anticipated that the patient will be medically stable for discharge from the hospital within 2 midnights of admission.   * I certify that at the point of admission it is my clinical judgment that the patient will require inpatient hospital care spanning beyond 2 midnights from the point of admission due to high intensity of service, high risk for further deterioration and high frequency of surveillance required.*  Time spent in coordination of this H&P: 50 minutes  Author: Jonnie Finner, DO 10/06/2021 3:45 PM  For on call review [email or web address].

## 2021-10-06 NOTE — ED Triage Notes (Signed)
Pt BIBA for weakness and fever with t.max 100.5.  Ems reports wheezing and rhonchi on arrival with duo neb, soul medrol, and Zofran admin Hx bladder cancer with last chemo 1 month ago Ems reports everyone in household Is sick,.  Ems cbg-140

## 2021-10-06 NOTE — Progress Notes (Signed)
A consult was received from an ED physician for cefepime per pharmacy dosing.  The patient's profile has been reviewed for ht/wt/allergies/indication/available labs.   A one time order has been placed for cefepime 2g.  Further antibiotics/pharmacy consults should be ordered by admitting physician if indicated.                       Thank you, Peggyann Juba, PharmD, BCPS 10/06/2021  2:20 PM

## 2021-10-06 NOTE — Progress Notes (Signed)
Patient arrived to room 1431.  VS WDL.  Cardiac monitoring established.  Daughter at bedside.  No c/o pain, N/V.  Angie Fava, RN

## 2021-10-06 NOTE — ED Notes (Signed)
Respiratory called for duoneb  

## 2021-10-07 ENCOUNTER — Ambulatory Visit (HOSPITAL_COMMUNITY): Payer: Medicare PPO

## 2021-10-07 DIAGNOSIS — J121 Respiratory syncytial virus pneumonia: Secondary | ICD-10-CM | POA: Diagnosis not present

## 2021-10-07 DIAGNOSIS — J9601 Acute respiratory failure with hypoxia: Secondary | ICD-10-CM | POA: Diagnosis not present

## 2021-10-07 LAB — RESPIRATORY PANEL BY PCR

## 2021-10-07 LAB — CBC
HCT: 27.4 % — ABNORMAL LOW (ref 36.0–46.0)
Hemoglobin: 8.8 g/dL — ABNORMAL LOW (ref 12.0–15.0)
MCH: 33 pg (ref 26.0–34.0)
MCHC: 32.1 g/dL (ref 30.0–36.0)
MCV: 102.6 fL — ABNORMAL HIGH (ref 80.0–100.0)
Platelets: 239 10*3/uL (ref 150–400)
RBC: 2.67 MIL/uL — ABNORMAL LOW (ref 3.87–5.11)
RDW: 17.6 % — ABNORMAL HIGH (ref 11.5–15.5)
WBC: 2.6 10*3/uL — ABNORMAL LOW (ref 4.0–10.5)
nRBC: 0 % (ref 0.0–0.2)

## 2021-10-07 LAB — COMPREHENSIVE METABOLIC PANEL
ALT: 18 U/L (ref 0–44)
AST: 30 U/L (ref 15–41)
Albumin: 3.2 g/dL — ABNORMAL LOW (ref 3.5–5.0)
Alkaline Phosphatase: 37 U/L — ABNORMAL LOW (ref 38–126)
Anion gap: 6 (ref 5–15)
BUN: 18 mg/dL (ref 8–23)
CO2: 27 mmol/L (ref 22–32)
Calcium: 8.4 mg/dL — ABNORMAL LOW (ref 8.9–10.3)
Chloride: 100 mmol/L (ref 98–111)
Creatinine, Ser: 0.85 mg/dL (ref 0.44–1.00)
GFR, Estimated: >60 mL/min (ref >60)
Glucose, Bld: 162 mg/dL — ABNORMAL HIGH (ref 70–99)
Potassium: 4.8 mmol/L (ref 3.5–5.1)
Sodium: 133 mmol/L — ABNORMAL LOW (ref 135–145)
Total Bilirubin: 0.5 mg/dL (ref 0.3–1.2)
Total Protein: 6.2 g/dL — ABNORMAL LOW (ref 6.5–8.1)

## 2021-10-07 LAB — HEMOGLOBIN A1C
Hgb A1c MFr Bld: 5.4 % (ref 4.8–5.6)
Mean Plasma Glucose: 108.28 mg/dL

## 2021-10-07 LAB — VITAMIN D 25 HYDROXY (VIT D DEFICIENCY, FRACTURES): Vit D, 25-Hydroxy: 49.56 ng/mL (ref 30–100)

## 2021-10-07 LAB — PROCALCITONIN: Procalcitonin: <0.1 ng/mL

## 2021-10-07 MED ORDER — LOSARTAN POTASSIUM 25 MG PO TABS
25.0000 mg | ORAL_TABLET | Freq: Every day | ORAL | Status: DC
  Administered 2021-10-07 – 2021-10-08 (×2): 25 mg via ORAL
  Filled 2021-10-07 (×2): qty 1

## 2021-10-07 MED ORDER — IPRATROPIUM-ALBUTEROL 0.5-2.5 (3) MG/3ML IN SOLN
3.0000 mL | Freq: Two times a day (BID) | RESPIRATORY_TRACT | Status: DC
  Administered 2021-10-08: 3 mL via RESPIRATORY_TRACT
  Filled 2021-10-07 (×2): qty 3

## 2021-10-07 MED ORDER — BUDESONIDE 0.25 MG/2ML IN SUSP
0.2500 mg | Freq: Two times a day (BID) | RESPIRATORY_TRACT | Status: DC
  Administered 2021-10-07 – 2021-10-08 (×2): 0.25 mg via RESPIRATORY_TRACT
  Filled 2021-10-07 (×2): qty 2

## 2021-10-07 MED ORDER — ALPRAZOLAM 0.5 MG PO TABS: 0.5000 mg | ORAL_TABLET | Freq: Every day | ORAL | Status: DC

## 2021-10-07 MED ORDER — APIXABAN 5 MG PO TABS: 5.0000 mg | ORAL_TABLET | Freq: Two times a day (BID) | ORAL | Status: DC

## 2021-10-07 MED ORDER — TRAZODONE HCL 100 MG PO TABS
100.0000 mg | ORAL_TABLET | Freq: Every day | ORAL | Status: DC
  Administered 2021-10-07: 100 mg via ORAL
  Filled 2021-10-07: qty 1

## 2021-10-07 MED ORDER — ALPRAZOLAM 0.5 MG PO TABS
1.0000 mg | ORAL_TABLET | Freq: Every day | ORAL | Status: DC
  Administered 2021-10-07: 1 mg via ORAL
  Filled 2021-10-07: qty 2

## 2021-10-07 MED ORDER — PANTOPRAZOLE SODIUM 40 MG PO TBEC
40.0000 mg | DELAYED_RELEASE_TABLET | Freq: Two times a day (BID) | ORAL | Status: DC
  Administered 2021-10-07 – 2021-10-08 (×2): 40 mg via ORAL
  Filled 2021-10-07 (×2): qty 1

## 2021-10-07 MED ORDER — ALPRAZOLAM 0.5 MG PO TABS
0.5000 mg | ORAL_TABLET | Freq: Two times a day (BID) | ORAL | Status: DC | PRN
  Administered 2021-10-07 – 2021-10-08 (×2): 0.5 mg via ORAL
  Filled 2021-10-07 (×2): qty 1

## 2021-10-07 MED ORDER — ALPRAZOLAM 0.5 MG PO TABS: 0.5000 mg | ORAL_TABLET | Freq: Every evening | ORAL | Status: DC | PRN

## 2021-10-07 MED ORDER — FERROUS SULFATE 325 (65 FE) MG PO TABS
325.0000 mg | ORAL_TABLET | Freq: Every day | ORAL | Status: DC
  Administered 2021-10-07: 325 mg via ORAL
  Filled 2021-10-07: qty 1

## 2021-10-07 MED ORDER — AMIODARONE HCL 200 MG PO TABS
200.0000 mg | ORAL_TABLET | Freq: Every day | ORAL | Status: DC
  Administered 2021-10-07 – 2021-10-08 (×2): 200 mg via ORAL
  Filled 2021-10-07 (×2): qty 1

## 2021-10-07 MED ORDER — IPRATROPIUM-ALBUTEROL 0.5-2.5 (3) MG/3ML IN SOLN
3.0000 mL | Freq: Four times a day (QID) | RESPIRATORY_TRACT | Status: DC | PRN
  Administered 2021-10-08: 3 mL via RESPIRATORY_TRACT

## 2021-10-07 MED ORDER — LEVOTHYROXINE SODIUM 112 MCG PO TABS
112.0000 ug | ORAL_TABLET | Freq: Every day | ORAL | Status: DC
  Administered 2021-10-08: 112 ug via ORAL
  Filled 2021-10-07: qty 1

## 2021-10-07 NOTE — Hospital Course (Addendum)
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  She presented with worsening shortness of breath, weakness, and cough.  She was found to have low-grade fever on work-up.  RVP positive for RSV.  CT chest showed infiltrate involving right middle and lower lobes.  She was started on steroids and admitted for further monitoring. She had good clinical response and was able to be weaned off of oxygen.  She ambulated well with no desaturations on room air.  She was continued on steroids and albuterol inhaler at discharge.

## 2021-10-07 NOTE — Progress Notes (Signed)
  Transition of Care (TOC) Screening Note   Patient Details  Name: Cynthia [name] Date of Birth: [birth year]   Transition of Care [facility]) CM/SW Contact:    Roseanne Kaufman, RN Phone Number: 10/07/2021, 1:45 PM    Transition of Care Department [facility]) has reviewed patient and no TOC needs have been identified at this time. We will continue to monitor patient advancement through interdisciplinary progression rounds. If new patient transition needs arise, please place a TOC consult.

## 2021-10-07 NOTE — Assessment & Plan Note (Addendum)
-   Mild, continue trending -Improved to 138 prior to discharge

## 2021-10-07 NOTE — Assessment & Plan Note (Signed)
-   Follows outpatient with Dr. Alen Blew.  Last seen 09/18/2021 - Underwent left nephroureterectomy in 2021 -On carboplatin and gemcitabine -Continue outpatient care with oncology

## 2021-10-07 NOTE — Progress Notes (Signed)
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  She presented with worsening shortness of breath, weakness, and cough.  She was found to have low-grade fever on work-up.  RVP positive for RSV.  CT chest showed infiltrate involving right middle and lower lobes.  She was started on steroids and admitted for further monitoring.  Interval History:  Laying in bed when seen this morning.  Still ongoing cough and wheezing.  Reviewed work-up thus far and plan for continuing to try to wean her oxygen while continuing breathing treatments.  Assessment and Plan: * RSV (respiratory syncytial virus pneumonia) - RVP swab positive for RSV.  CT chest showing infiltrates involving right middle and lower lobe - Discussed with pulmonology and ID.  No indication for Ribavarin or escalation of treatment beyond steroids and supportive care -Continue breathing treatments, steroids  Acute hypoxemic respiratory failure ([place]) - Due to underlying viral pneumonia - Not on oxygen at home - Continue O2 and wean as able  Urothelial cancer ([place]) - Follows outpatient with Cynthia [name].  Last seen 09/18/2021 - Underwent left nephroureterectomy in 2021 -On carboplatin and gemcitabine -Continue outpatient care with oncology  GERD (gastroesophageal reflux disease) - Continue Protonix  Hyponatremia - Mild, continue trending  Hypocalcemia - Replete as needed  Hypokalemia - Replete as needed  Paroxysmal atrial fibrillation ([facility]) - Continue Eliquis and amiodarone  Hypothyroidism - Continue Synthroid  Diabetes type 2, controlled ([place]) - continue SSI and CBG monitoring   Old records reviewed in assessment of this patient  Antimicrobials:   DVT prophylaxis:    apixaban (ELIQUIS) tablet 5 mg   Code Status:   Code Status: Partial Code  Mobility Assessment (last 72 hours)     Mobility Assessment     Row Name 10/07/21 1224 10/06/21 22:34:23 10/06/21 2023       Does patient have an order for bedrest or is patient medically unstable No - Continue assessment Yes- Bedfast (Level 1) - Complete Yes- Bedfast (Level 1) - Complete     What is the highest level of mobility based on the progressive mobility assessment? Level 5 (Walks with assist in room/hall) - Balance while stepping forward/back and can walk in room with assist - Complete -- --              Barriers to discharge: None Disposition Plan:   Status is: Inpt  Objective: Blood pressure 107/63, pulse 91, temperature 98.6 F (37 C), temperature source Oral, resp. rate 16, weight 75.7 kg, SpO2 92 %.  Examination:  Physical Exam Constitutional:      Appearance: Normal appearance.  HENT:     Head: Normocephalic and atraumatic.     Mouth/Throat:     Mouth: Mucous membranes are moist.  Eyes:     Extraocular Movements: Extraocular movements intact.  Cardiovascular:     Rate and Rhythm: Normal rate and regular rhythm.  Pulmonary:     Comments: Diffuse coarse breath sounds bilaterally with associated wheezing Abdominal:     General: Bowel sounds are normal. There is no distension.     Palpations: Abdomen is soft.     Tenderness: There is no abdominal tenderness.  Musculoskeletal:        General: Normal range of  motion.     Cervical back: Normal range of motion and neck supple.  Skin:    General: Skin is warm and dry.  Neurological:     General: No focal deficit present.     Mental Status: She is alert.  Psychiatric:        Mood and Affect: Mood normal.        Behavior: Behavior normal.      Consultants:    Procedures:    Data Reviewed: Results for orders placed or performed during the hospital encounter of 10/06/21 (from the past 24 hour(s))  Culture, blood  (routine x 2)     Status: None (Preliminary result)   Collection Time: 10/06/21  2:57 PM   Specimen: BLOOD  Result Value Ref Range   Specimen Description      BLOOD BLOOD LEFT WRIST Performed at [facility], [place] [address], [place], [place] [address]    Special Requests      BOTTLES DRAWN AEROBIC AND ANAEROBIC Blood Culture adequate volume Performed at [facility] [address], [place], [place] [address]    Culture      NO GROWTH < 24 HOURS Performed at [facility] [address] [place], [place] [address]    Report Status PENDING   Respiratory (~20 pathogens) panel by PCR     Status: Abnormal   Collection Time: 10/06/21  4:29 PM   Specimen: Nasopharyngeal Swab; Respiratory  Result Value Ref Range   Adenovirus NOT DETECTED NOT DETECTED   Coronavirus 229E NOT DETECTED NOT DETECTED   Coronavirus HKU1 NOT DETECTED NOT DETECTED   Coronavirus NL63 NOT DETECTED NOT DETECTED   Coronavirus OC43 NOT DETECTED NOT DETECTED   Metapneumovirus NOT DETECTED NOT DETECTED   Rhinovirus / Enterovirus NOT DETECTED NOT DETECTED   Influenza A NOT DETECTED NOT DETECTED   Influenza B NOT DETECTED NOT DETECTED   Parainfluenza Virus 1 NOT DETECTED NOT DETECTED   Parainfluenza Virus 2 NOT DETECTED NOT DETECTED   Parainfluenza Virus 3 NOT DETECTED NOT DETECTED   Parainfluenza Virus 4 NOT DETECTED NOT DETECTED   Respiratory Syncytial Virus DETECTED (A) NOT DETECTED   Bordetella pertussis NOT DETECTED NOT DETECTED   Bordetella Parapertussis NOT DETECTED NOT DETECTED   Chlamydophila pneumoniae NOT DETECTED NOT DETECTED   Mycoplasma pneumoniae NOT DETECTED NOT DETECTED  MRSA Next Gen by PCR, Nasal     Status: None   Collection Time: 10/06/21  5:45 PM   Specimen: Nasal Mucosa; Nasal Swab  Result Value Ref Range   MRSA by PCR Next Gen NOT DETECTED NOT DETECTED  Magnesium     Status: Abnormal   Collection Time: 10/06/21  8:21 PM  Result Value Ref Range    Magnesium 1.5 (L) 1.7 - 2.4 mg/dL  VITAMIN D 25 Hydroxy (Vit-D Deficiency, Fractures)     Status: None   Collection Time: 10/06/21  8:21 PM  Result Value Ref Range   Vit D, 25-Hydroxy 49.56 30 - 100 ng/mL  Hemoglobin A1c     Status: None   Collection Time: 10/06/21  8:21 PM  Result Value Ref Range   Hgb A1c MFr Bld 5.4 4.8 - 5.6 %   Mean Plasma Glucose 108.28 mg/dL  Comprehensive metabolic panel     Status: Abnormal   Collection Time: 10/07/21  2:55 AM  Result Value Ref Range   Sodium 133 (L) 135 - 145 mmol/L   Potassium 4.8 3.5 - 5.1 mmol/L   Chloride  100 98 - 111 mmol/L   CO2 27 22 - 32 mmol/L   Glucose, Bld 162 (H) 70 - 99 mg/dL   BUN 18 8 - 23 mg/dL   Creatinine, Ser 0.85 0.44 - 1.00 mg/dL   Calcium 8.4 (L) 8.9 - 10.3 mg/dL   Total Protein 6.2 (L) 6.5 - 8.1 g/dL   Albumin 3.2 (L) 3.5 - 5.0 g/dL   AST 30 15 - 41 U/L   ALT 18 0 - 44 U/L   Alkaline Phosphatase 37 (L) 38 - 126 U/L   Total Bilirubin 0.5 0.3 - 1.2 mg/dL   GFR, Estimated >60 >60 mL/min   Anion gap 6 5 - 15  CBC     Status: Abnormal   Collection Time: 10/07/21  2:55 AM  Result Value Ref Range   WBC 2.6 (L) 4.0 - 10.5 K/uL   RBC 2.67 (L) 3.87 - 5.11 MIL/uL   Hemoglobin 8.8 (L) 12.0 - 15.0 g/dL   HCT 27.4 (L) 36.0 - 46.0 %   MCV 102.6 (H) 80.0 - 100.0 fL   MCH 33.0 26.0 - 34.0 pg   MCHC 32.1 30.0 - 36.0 g/dL   RDW 17.6 (H) 11.5 - 15.5 %   Platelets 239 150 - 400 K/uL   nRBC 0.0 0.0 - 0.2 %  Procalcitonin - Baseline     Status: None   Collection Time: 10/07/21  2:55 AM  Result Value Ref Range   Procalcitonin <0.10 ng/mL    I have Reviewed nursing notes, Vitals, and Lab results since pt's last encounter. Pertinent lab results : see above I have ordered test including BMP, CBC, Mg I have reviewed the last note from staff over past 24 hours I have discussed pt's care plan and test results with nursing staff, case manager   LOS: 1 day   Dwyane Dee, MD [facility] 10/07/2021, 1:57 PM

## 2021-10-07 NOTE — Assessment & Plan Note (Signed)
-   Continue Eliquis and amiodarone 

## 2021-10-07 NOTE — Assessment & Plan Note (Addendum)
-   RVP swab positive for RSV.  CT chest showing infiltrates involving right middle and lower lobe - Discussed with pulmonology and ID.  No indication for Ribavarin or escalation of treatment beyond steroids and supportive care -Albuterol and steroids continued at discharge

## 2021-10-07 NOTE — Assessment & Plan Note (Addendum)
Repleted. °

## 2021-10-07 NOTE — Assessment & Plan Note (Signed)
Continue Synthroid °

## 2021-10-07 NOTE — Assessment & Plan Note (Addendum)
Continue Protonix °

## 2021-10-07 NOTE — Assessment & Plan Note (Addendum)
-   Continue home regimen 

## 2021-10-07 NOTE — Evaluation (Signed)
Physical Therapy Evaluation Patient Details Name: Cynthia [name] MRN: [id number] DOB: 1945/09/07 Today's Date: 10/07/2021  History of Present Illness  76 yo female admitted with Pna. Hx of bladder ca, HB, DM, Afib, breast ca, pacemaker, OA, NSVT, DDD.  Clinical Impression  On eval, pt was Min guard A for mobility. She walked ~120 feet around the unit. She was mildly unsteady initially but that improved a bit with time. At rest, O2 95% on RA. With ambulation, O2 88% on RA, audible wheezing, dyspnea 2/4, HR 124 bpm. Assisted pt back to bed at her request.        Recommendations for follow up therapy are one component of a multi-disciplinary discharge planning process, led by the attending physician.  Recommendations may be updated based on patient status, additional functional criteria and insurance authorization.  Follow Up Recommendations No PT follow up      Assistance Recommended at Discharge Intermittent Supervision/Assistance  Patient can return home with the following       Equipment Recommendations None recommended by PT  Recommendations for Other Services       Functional Status Assessment Patient has had a recent decline in their functional status and demonstrates the ability to make significant improvements in function in a reasonable and predictable amount of time.     Precautions / Restrictions Precautions Precautions: Fall Restrictions Weight Bearing Restrictions: No      Mobility  Bed Mobility Overal bed mobility: Modified Independent                  Transfers Overall transfer level: Needs assistance   Transfers: Sit to/from Stand Sit to Stand: Min guard           General transfer comment: Supv for safety. Some unsteadiness initially with pt having to hold onto furniture in room for steadying support.    Ambulation/Gait Ambulation/Gait assistance: Min guard Gait Distance (Feet): 120 Feet Assistive device: None Gait Pattern/deviations:  Step-through pattern, Decreased stride length       General Gait Details: Pt begain with "furniture walking/cruising" technique. As distance increased, she was able to walk without UE support. Mildly unsteady but no overt LOB that required external assistance. O2 88% on RA with audible wheezing, HR 124 bpm, dyspnea 2/4  Stairs            Wheelchair Mobility    Modified Rankin (Stroke Patients Only)       Balance Overall balance assessment: Needs assistance           Standing balance-Leahy Scale: Fair                               Pertinent Vitals/Pain Pain Assessment Pain Assessment: No/denies pain    Home Living Family/patient expects to be discharged to:: Private residence Living Arrangements: Children;Spouse/significant other Available Help at Discharge: Family Type of Home: House Home Access: Stairs to enter   Technical brewer of Steps: 1 threshold   Home Layout: One level Home Equipment: None Additional Comments: pt and daughter reports pt's husband has a rollator    Prior Function Prior Level of Function : Independent/Modified Independent             Mobility Comments: mod ind per pt ADLs Comments: mod ind     Hand Dominance        Extremity/Trunk Assessment   Upper Extremity Assessment Upper Extremity Assessment: Overall WFL for tasks assessed    Lower Extremity  Assessment Lower Extremity Assessment: Generalized weakness    Cervical / Trunk Assessment Cervical / Trunk Assessment: Kyphotic  Communication   Communication: No difficulties  Cognition Arousal/Alertness: Awake/alert Behavior During Therapy: WFL for tasks assessed/performed Overall Cognitive Status: Within Functional Limits for tasks assessed                                          General Comments      Exercises     Assessment/Plan    PT Assessment Patient needs continued PT services  PT Problem List Decreased  strength;Decreased mobility;Decreased activity tolerance;Decreased balance       PT Treatment Interventions DME instruction;Gait training;Therapeutic activities;Therapeutic exercise;Patient/family education;Functional mobility training;Balance training    PT Goals (Current goals can be found in the Care Plan section)  Acute Rehab PT Goals Patient Stated Goal: home soon PT Goal Formulation: With patient Time For Goal Achievement: 10/21/21 Potential to Achieve Goals: Good    Frequency Min 3X/week     Co-evaluation               AM-PAC PT "6 Clicks" Mobility  Outcome Measure Help needed turning from your back to your side while in a flat bed without using bedrails?: None Help needed moving from lying on your back to sitting on the side of a flat bed without using bedrails?: None Help needed moving to and from a bed to a chair (including a wheelchair)?: None Help needed standing up from a chair using your arms (e.g., wheelchair or bedside chair)?: None Help needed to walk in hospital room?: A Little Help needed climbing 3-5 steps with a railing? : A Little 6 Click Score: 22    End of Session Equipment Utilized During Treatment: Gait belt Activity Tolerance: Patient tolerated treatment well Patient left: in bed;with call bell/phone within reach   PT Visit Diagnosis: Unsteadiness on feet (R26.81);Muscle weakness (generalized) (M62.81)    Time: 1610-9604 PT Time Calculation (min) (ACUTE ONLY): 15 min   Charges:               Doreatha Massed, PT Acute Rehabilitation  Office: [phone] Pager: [phone]

## 2021-10-07 NOTE — Assessment & Plan Note (Addendum)
-   Due to underlying viral pneumonia - Not on oxygen at home -Successfully weaned to room air prior to discharge

## 2021-10-08 DIAGNOSIS — J121 Respiratory syncytial virus pneumonia: Secondary | ICD-10-CM | POA: Diagnosis not present

## 2021-10-08 DIAGNOSIS — J9601 Acute respiratory failure with hypoxia: Secondary | ICD-10-CM | POA: Diagnosis not present

## 2021-10-08 LAB — BASIC METABOLIC PANEL
Anion gap: 5 (ref 5–15)
BUN: 16 mg/dL (ref 8–23)
CO2: 32 mmol/L (ref 22–32)
Calcium: 8.8 mg/dL — ABNORMAL LOW (ref 8.9–10.3)
Chloride: 101 mmol/L (ref 98–111)
Creatinine, Ser: 0.8 mg/dL (ref 0.44–1.00)
GFR, Estimated: >60 mL/min (ref >60)
Glucose, Bld: 128 mg/dL — ABNORMAL HIGH (ref 70–99)
Potassium: 5.1 mmol/L (ref 3.5–5.1)
Sodium: 138 mmol/L (ref 135–145)

## 2021-10-08 LAB — MAGNESIUM: Magnesium: 2.1 mg/dL (ref 1.7–2.4)

## 2021-10-08 LAB — CBC WITH DIFFERENTIAL/PLATELET
Abs Immature Granulocytes: 0.03 10*3/uL (ref 0.00–0.07)
Basophils Absolute: 0 10*3/uL (ref 0.0–0.1)
Basophils Relative: 0 %
Eosinophils Absolute: 0 10*3/uL (ref 0.0–0.5)
Eosinophils Relative: 0 %
HCT: 28.6 % — ABNORMAL LOW (ref 36.0–46.0)
Hemoglobin: 9.3 g/dL — ABNORMAL LOW (ref 12.0–15.0)
Immature Granulocytes: 1 %
Lymphocytes Relative: 10 %
Lymphs Abs: 0.5 10*3/uL — ABNORMAL LOW (ref 0.7–4.0)
MCH: 33.3 pg (ref 26.0–34.0)
MCHC: 32.5 g/dL (ref 30.0–36.0)
MCV: 102.5 fL — ABNORMAL HIGH (ref 80.0–100.0)
Monocytes Absolute: 0.4 10*3/uL (ref 0.1–1.0)
Monocytes Relative: 8 %
Neutro Abs: 4.1 10*3/uL (ref 1.7–7.7)
Neutrophils Relative %: 81 %
Platelets: 322 10*3/uL (ref 150–400)
RBC: 2.79 MIL/uL — ABNORMAL LOW (ref 3.87–5.11)
RDW: 18.2 % — ABNORMAL HIGH (ref 11.5–15.5)
WBC: 5 10*3/uL (ref 4.0–10.5)
nRBC: 0 % (ref 0.0–0.2)

## 2021-10-08 LAB — PROCALCITONIN: Procalcitonin: 0.1 ng/mL

## 2021-10-08 MED ORDER — ALBUTEROL SULFATE HFA 108 (90 BASE) MCG/ACT IN AERS: 2.0000 | INHALATION_SPRAY | RESPIRATORY_TRACT | 1 refills | Status: DC | PRN

## 2021-10-08 MED ORDER — HEPARIN SOD (PORK) LOCK FLUSH 100 UNIT/ML IV SOLN: 500.0000 [IU] | INTRAVENOUS | Status: DC | PRN

## 2021-10-08 MED ORDER — GUAIFENESIN ER 600 MG PO TB12: 600.0000 mg | ORAL_TABLET | Freq: Two times a day (BID) | ORAL | Status: AC

## 2021-10-08 MED ORDER — PREDNISONE 20 MG PO TABS: 40.0000 mg | ORAL_TABLET | Freq: Every day | ORAL | 0 refills | Status: AC

## 2021-10-08 NOTE — Progress Notes (Signed)
SATURATION QUALIFICATIONS: (This note is used to comply with regulatory documentation for home oxygen)  Patient Saturations on Room Air at Rest = 92%  Patient Saturations on Room Air while Ambulating = 94%  Patient Saturations on 0 Liters of oxygen while Ambulating = 94%  Please briefly explain why patient needs home oxygen:  Patient oxygen maintained at 94% with ambulation in the room, on room air.

## 2021-10-08 NOTE — Discharge Summary (Signed)
Physician Discharge Summary   Cynthia [name] HYI:[id number] DOB: [birth year] DOA: 10/06/2021  PCP: Cynthia Halsted, FNP  Admit date: 10/06/2021 Discharge date: 10/08/2021 Barriers to discharge: none  Admitted From: home Disposition:  home Discharging physician: Cynthia Dee, MD  Recommendations for Outpatient Follow-up:  Follow up with oncology  Home Health:  Equipment/Devices:   Discharge Condition: stable CODE STATUS: Partial Diet recommendation:  Diet Orders (From admission, onward)     Start     Ordered   10/08/21 0000  Diet Carb Modified        10/08/21 0948   10/06/21 1633  Diet Carb Modified Fluid consistency: Thin; Room service appropriate? Yes  Diet effective now       Question Answer Comment  Diet-HS Snack? Nothing   Calorie Level Medium 1600-2000   Fluid consistency: Thin   Room service appropriate? Yes      10/06/21 1632            Hospital Course: 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  She presented with worsening shortness of breath, weakness, and cough.  She was found to have low-grade fever on work-up.  RVP positive for RSV.  CT chest showed infiltrate involving right middle and lower lobes.  She was started on steroids and admitted for further monitoring. She had good clinical response and was able to be weaned off of oxygen.  She ambulated well with no desaturations on room air.  She was continued on steroids and albuterol inhaler at discharge.  Assessment and Plan: * RSV (respiratory syncytial virus pneumonia) - RVP swab positive for RSV.  CT chest showing infiltrates involving right middle and lower lobe - Discussed with pulmonology and ID.  No indication for Ribavarin or escalation of treatment beyond steroids and supportive care -Albuterol and steroids continued at discharge  Acute hypoxemic respiratory failure (HCC)-resolved as of 10/08/2021 - Due to  underlying viral pneumonia - Not on oxygen at home -Successfully weaned to room air prior to discharge  Urothelial cancer [facility]) - Follows outpatient with Dr. Alen [name].  Last seen 09/18/2021 - Underwent left nephroureterectomy in 2021 -On carboplatin and gemcitabine -Continue outpatient care with oncology  GERD (gastroesophageal reflux disease) - Continue Protonix  Hypocalcemia - Repleted  Hypokalemia - Repleted  Paroxysmal atrial fibrillation (HCC) - Continue Eliquis and amiodarone  Hypothyroidism - Continue Synthroid  Diabetes type 2, controlled (Cynthia [name]) - Continue home regimen  Hyponatremia-resolved as of 10/08/2021 - Mild, continue trending -Improved to 138 prior to discharge       The patient's chronic medical conditions were treated accordingly per the patient's home medication regimen except as noted.  On day of discharge, patient was felt deemed stable for discharge. Patient/family member advised to call PCP or come back to ER if needed.   Principal Diagnosis: RSV (respiratory syncytial virus pneumonia)  Discharge Diagnoses: Active Hospital Problems   Diagnosis Date Noted   RSV (respiratory syncytial virus pneumonia) 08/31/2021    Priority: 1.   Urothelial cancer ([place]) 09/22/2019    Priority: 3.   Hypokalemia 10/06/2021   Hypocalcemia 10/06/2021   GERD (gastroesophageal reflux disease) 10/06/2021   Paroxysmal atrial fibrillation ([place]) 08/31/2021   Diabetes type 2, controlled ([place]) 12/10/2018   Hypothyroidism 12/10/2018   Malignant neoplasm of upper-inner quadrant of right breast in female, estrogen receptor positive ([place]) 03/18/2016    Resolved Hospital Problems   Diagnosis Date Noted Date Resolved   Acute  hypoxemic respiratory failure ([place]) 09/13/2021 10/08/2021    Priority: 2.   Hyponatremia 10/06/2021 10/08/2021     Discharge Instructions     Diet Carb Modified   Complete by: As directed    Increase activity slowly   Complete by: As directed        Allergies as of 10/08/2021   No Known Allergies      Medication List     STOP taking these medications    cefdinir 300 MG capsule Commonly known as: OMNICEF   lidocaine-prilocaine cream Commonly known as: EMLA   prochlorperazine 10 MG tablet Commonly known as: COMPAZINE       TAKE these medications    acetaminophen 500 MG tablet Commonly known as: TYLENOL Take 1,000 mg by mouth every 6 (six) hours as needed for headache or mild pain. Notes to patient: 10/08/21, every 6 hours as needed   albuterol 108 (90 Base) MCG/ACT inhaler Commonly known as: VENTOLIN HFA Inhale 2 puffs into the lungs every 4 (four) hours as needed for wheezing or shortness of breath. Notes to patient: 10/08/21, every 4 hours as needed   ALPRAZolam 1 MG tablet Commonly known as: XANAX Take 0.5-1 mg by mouth at bedtime. Take 1 mg by mouth at 11 PM and an additional 0.5 mg four hours later if needed for unresolved sleeplessness Notes to patient: 10/08/21, bedtime   amiodarone 100 MG tablet Commonly known as: Pacerone Take 2 tablets (200 mg total) by mouth daily. Notes to patient: 10/09/21   anastrozole 1 MG tablet Commonly known as: ARIMIDEX TAKE 1 TABLET BY MOUTH EVERY DAY What changed: when to take this Notes to patient: 10/09/21   Eliquis 5 MG Tabs tablet Generic drug: apixaban TAKE 1 TABLET BY MOUTH TWICE A DAY What changed: how much to take Notes to patient: 10/08/21, evening   Fenofibric Acid 135 MG Cpdr Take 135 mg by mouth daily. Notes to patient: 10/09/21   ferrous sulfate 325 (65 FE) MG tablet Take 325 mg by mouth at bedtime. Notes to patient: 10/08/21   FIBER PO Take 6-7 tablets by mouth daily. Notes to patient: 10/09/21   fluconazole 150 MG tablet Commonly known as: DIFLUCAN Take 1 tablet (150 mg total) by mouth once as needed for up to 1 dose (yeast infection). Notes to patient: As needed   guaiFENesin 600 MG 12 hr tablet Commonly known as: MUCINEX Take 1 tablet  (600 mg total) by mouth 2 (two) times daily for 6 days. Notes to patient: 10/08/21, evening   levothyroxine 112 MCG tablet Commonly known as: SYNTHROID Take 112 mcg by mouth daily. Notes to patient: 10/09/21   losartan 25 MG tablet Commonly known as: COZAAR Take 25 mg by mouth daily. Notes to patient: 10/09/21   pantoprazole 40 MG tablet Commonly known as: PROTONIX Take 40 mg by mouth 2 (two) times daily before a meal. Notes to patient: 10/08/21, evening   predniSONE 20 MG tablet Commonly known as: DELTASONE Take 2 tablets (40 mg total) by mouth daily with breakfast for 5 days. Notes to patient: 10/09/21   traZODone 100 MG tablet Commonly known as: DESYREL Take 100 mg by mouth See admin instructions. Take 100 mg by mouth at 11 PM every night Notes to patient: 10/08/21   Vitamin D-3 25 MCG (1000 UT) Caps Take 1,000 Units by mouth daily with breakfast. Notes to patient: 10/09/21        No Known Allergies  Consultations:   Procedures:   Discharge Exam: BP Marland Kitchen)  159/98 (BP Location: Left Arm)   Pulse 93   Temp 98.2 F (36.8 C) (Oral)   Resp 20   Ht 5' 5.5" (1.664 m)   Wt 72.9 kg   SpO2 93%   BMI 26.34 kg/m  Physical Exam Constitutional:      Appearance: Normal appearance.  HENT:     Head: Normocephalic and atraumatic.     Mouth/Throat:     Mouth: Mucous membranes are moist.  Eyes:     Extraocular Movements: Extraocular movements intact.  Cardiovascular:     Rate and Rhythm: Normal rate and regular rhythm.  Pulmonary:     Comments: Diffuse coarse breath sounds bilaterally; no further wheezing Abdominal:     General: Bowel sounds are normal. There is no distension.     Palpations: Abdomen is soft.     Tenderness: There is no abdominal tenderness.  Musculoskeletal:        General: Normal range of motion.     Cervical back: Normal range of motion and neck supple.  Skin:    General: Skin is warm and dry.  Neurological:     General: No focal deficit present.      Mental Status: She is alert.  Psychiatric:        Mood and Affect: Mood normal.        Behavior: Behavior normal.      The results of significant diagnostics from this hospitalization (including imaging, microbiology, ancillary and laboratory) are listed below for reference.   Microbiology: Recent Results (from the past 240 hour(s))  Culture, blood (routine x 2)     Status: None (Preliminary result)   Collection Time: 10/06/21 10:38 AM   Specimen: BLOOD  Result Value Ref Range Status   Specimen Description   Final    BLOOD CHEST Performed at [facility] [address] [place], [place] [address]    Special Requests   Final    BOTTLES DRAWN AEROBIC ONLY Blood Culture results may not be optimal due to an inadequate volume of blood received in culture bottles Performed at [facility] [address] [place], [place] [address]    Culture   Final    NO GROWTH 2 DAYS Performed at [facility] [address] [place], [place] [address]    Report Status PENDING  Incomplete  Resp Panel by RT-PCR (Flu A&B, Covid) Anterior Nasal Swab     Status: None   Collection Time: 10/06/21 10:47 AM   Specimen: Anterior Nasal Swab  Result Value Ref Range Status   SARS Coronavirus 2 by RT PCR NEGATIVE NEGATIVE Final    Comment: (NOTE) SARS-CoV-2 target nucleic acids are NOT DETECTED.  The SARS-CoV-2 RNA is generally detectable in upper respiratory specimens during the acute phase of infection. The lowest concentration of SARS-CoV-2 viral copies this assay can detect is 138 copies/mL. A negative result does not preclude SARS-Cov-2 infection and should not be used as the sole basis for treatment or other patient management decisions. A negative result may occur with  improper specimen collection/handling, submission of specimen other than nasopharyngeal swab, presence of viral mutation(s) within the areas targeted by this assay, and inadequate  number of viral copies(<138 copies/mL). A negative result must be combined with clinical observations, patient history, and epidemiological information. The expected result is Negative.  Fact Sheet for Patients:  EntrepreneurPulse.com.au  Fact Sheet for Healthcare Providers:  IncredibleEmployment.be  This test is no t yet approved or cleared by the  [place] Architectural technologist and  has been authorized for detection and/or diagnosis of SARS-CoV-2 by FDA under an Print production planner (EUA). This EUA will remain  in effect (meaning this test can be used) for the duration of the COVID-19 declaration under Section 564(b)(1) of the Act, 21 U.S.C.section 360bbb-3(b)(1), unless the authorization is terminated  or revoked sooner.       Influenza A by PCR NEGATIVE NEGATIVE Final   Influenza B by PCR NEGATIVE NEGATIVE Final    Comment: (NOTE) The Xpert Xpress SARS-CoV-2/FLU/RSV plus assay is intended as an aid in the diagnosis of influenza from Nasopharyngeal swab specimens and should not be used as a sole basis for treatment. Nasal washings and aspirates are unacceptable for Xpert Xpress SARS-CoV-2/FLU/RSV testing.  Fact Sheet for Patients: EntrepreneurPulse.com.au  Fact Sheet for Healthcare Providers: IncredibleEmployment.be  This test is not yet approved or cleared by the [place] FDA and has been authorized for detection and/or diagnosis of SARS-CoV-2 by FDA under an Emergency Use Authorization (EUA). This EUA will remain in effect (meaning this test can be used) for the duration of the COVID-19 declaration under Section 564(b)(1) of the Act, 21 U.S.C. section 360bbb-3(b)(1), unless the authorization is terminated or revoked.  Performed at [facility], [place] [address], [place], [place] [address]   Culture, blood (routine x 2)     Status: None (Preliminary result)   Collection Time:  10/06/21  2:57 PM   Specimen: BLOOD  Result Value Ref Range Status   Specimen Description   Final    BLOOD BLOOD LEFT WRIST Performed at [facility] [address], [place], [place] [address]    Special Requests   Final    BOTTLES DRAWN AEROBIC AND ANAEROBIC Blood Culture adequate volume Performed at [facility] [address], [place], [place] [address]    Culture   Final    NO GROWTH 2 DAYS Performed at [facility] [address] [place], [place] [address]    Report Status PENDING  Incomplete  Respiratory (~20 pathogens) panel by PCR     Status: Abnormal   Collection Time: 10/06/21  4:29 PM   Specimen: Nasopharyngeal Swab; Respiratory  Result Value Ref Range Status   Adenovirus NOT DETECTED NOT DETECTED Final   Coronavirus 229E NOT DETECTED NOT DETECTED Final    Comment: (NOTE) The Coronavirus on the Respiratory Panel, DOES NOT test for the novel  Coronavirus (2019 nCoV)    Coronavirus HKU1 NOT DETECTED NOT DETECTED Final   Coronavirus NL63 NOT DETECTED NOT DETECTED Final   Coronavirus OC43 NOT DETECTED NOT DETECTED Final   Metapneumovirus NOT DETECTED NOT DETECTED Final   Rhinovirus / Enterovirus NOT DETECTED NOT DETECTED Final   Influenza A NOT DETECTED NOT DETECTED Final   Influenza B NOT DETECTED NOT DETECTED Final   Parainfluenza Virus 1 NOT DETECTED NOT DETECTED Final   Parainfluenza Virus 2 NOT DETECTED NOT DETECTED Final   Parainfluenza Virus 3 NOT DETECTED NOT DETECTED Final   Parainfluenza Virus 4 NOT DETECTED NOT DETECTED Final   Respiratory Syncytial Virus DETECTED (A) NOT DETECTED Final   Bordetella pertussis NOT DETECTED NOT DETECTED Final   Bordetella Parapertussis NOT DETECTED NOT DETECTED Final   Chlamydophila pneumoniae NOT DETECTED NOT DETECTED Final   Mycoplasma pneumoniae NOT DETECTED NOT DETECTED Final    Comment: Performed at [facility], [address] [place], [place] [address]  MRSA Next  Gen by PCR, Nasal  Status: None   Collection Time: 10/06/21  5:45 PM   Specimen: Nasal Mucosa; Nasal Swab  Result Value Ref Range Status   MRSA by PCR Next Gen NOT DETECTED NOT DETECTED Final    Comment: (NOTE) The GeneXpert MRSA Assay (FDA approved for NASAL specimens only), is one component of a comprehensive MRSA colonization surveillance program. It is not intended to diagnose MRSA infection nor to guide or monitor treatment for MRSA infections. Test performance is not FDA approved in patients less than 12 years old. Performed at [facility], [place] [address] [place], [place] [address]      Labs: BNP (last 3 results) No results for input(s): "BNP" in the last 8760 hours. Basic Metabolic Panel: Recent Labs  Lab 10/06/21 1049 10/06/21 2021 10/07/21 0255 10/08/21 0404  NA 130*  --  133* 138  K 3.2*  --  4.8 5.1  CL 98  --  100 101  CO2 24  --  27 32  GLUCOSE 109*  --  162* 128*  BUN 13  --  18 16  CREATININE 0.81  --  0.85 0.80  CALCIUM 7.7*  --  8.4* 8.8*  MG  --  1.5*  --  2.1   Liver Function Tests: Recent Labs  Lab 10/06/21 1049 10/07/21 0255  AST 34 30  ALT 17 18  ALKPHOS 41 37*  BILITOT 0.8 0.5  PROT 6.7 6.2*  ALBUMIN 3.6 3.2*   No results for input(s): "LIPASE", "AMYLASE" in the last 168 hours. No results for input(s): "AMMONIA" in the last 168 hours. CBC: Recent Labs  Lab 10/06/21 1049 10/07/21 0255 10/08/21 0404  WBC 3.1* 2.6* 5.0  NEUTROABS 2.1  --  4.1  HGB 9.5* 8.8* 9.3*  HCT 29.7* 27.4* 28.6*  MCV 104.2* 102.6* 102.5*  PLT 210 239 322   Cardiac Enzymes: No results for input(s): "CKTOTAL", "CKMB", "CKMBINDEX", "TROPONINI" in the last 168 hours. BNP: Invalid input(s): "POCBNP" CBG: No results for input(s): "GLUCAP" in the last 168 hours. D-Dimer No results for input(s): "DDIMER" in the last 72 hours. Hgb A1c Recent Labs    10/06/21 2021  HGBA1C 5.4   Lipid Profile No results for input(s): "CHOL", "HDL",  "LDLCALC", "TRIG", "CHOLHDL", "LDLDIRECT" in the last 72 hours. Thyroid function studies No results for input(s): "TSH", "T4TOTAL", "T3FREE", "THYROIDAB" in the last 72 hours.  Invalid input(s): "FREET3" Anemia work up No results for input(s): "VITAMINB12", "FOLATE", "FERRITIN", "TIBC", "IRON", "RETICCTPCT" in the last 72 hours. Urinalysis    Component Value Date/Time   COLORURINE YELLOW 08/31/2021 [place] 08/31/2021 1321   LABSPEC 1.018 08/31/2021 1321   PHURINE 5.0 08/31/2021 1321   GLUCOSEU NEGATIVE 08/31/2021 1321   HGBUR NEGATIVE 08/31/2021 1321   BILIRUBINUR NEGATIVE 08/31/2021 1321   KETONESUR NEGATIVE 08/31/2021 1321   PROTEINUR NEGATIVE 08/31/2021 1321   NITRITE NEGATIVE 08/31/2021 1321   LEUKOCYTESUR SMALL (A) 08/31/2021 1321   Sepsis Labs Recent Labs  Lab 10/06/21 1049 10/07/21 0255 10/08/21 0404  WBC 3.1* 2.6* 5.0   Microbiology Recent Results (from the past 240 hour(s))  Culture, blood (routine x 2)     Status: None (Preliminary result)   Collection Time: 10/06/21 10:38 AM   Specimen: BLOOD  Result Value Ref Range Status   Specimen Description   Final    BLOOD CHEST Performed at [facility], [place] [address] [place], [place] [address]    Special Requests   Final    BOTTLES DRAWN  AEROBIC ONLY Blood Culture results may not be optimal due to an inadequate volume of blood received in culture bottles Performed at [facility], [place] [address] [place], [place] [address]    Culture   Final    NO GROWTH 2 DAYS Performed at [facility] [address], [place], [place] [address]    Report Status PENDING  Incomplete  Resp Panel by RT-PCR (Flu A&B, Covid) Anterior Nasal Swab     Status: None   Collection Time: 10/06/21 10:47 AM   Specimen: Anterior Nasal Swab  Result Value Ref Range Status   SARS Coronavirus 2 by RT PCR NEGATIVE NEGATIVE Final    Comment: (NOTE) SARS-CoV-2 target nucleic acids are  NOT DETECTED.  The SARS-CoV-2 RNA is generally detectable in upper respiratory specimens during the acute phase of infection. The lowest concentration of SARS-CoV-2 viral copies this assay can detect is 138 copies/mL. A negative result does not preclude SARS-Cov-2 infection and should not be used as the sole basis for treatment or other patient management decisions. A negative result may occur with  improper specimen collection/handling, submission of specimen other than nasopharyngeal swab, presence of viral mutation(s) within the areas targeted by this assay, and inadequate number of viral copies(<138 copies/mL). A negative result must be combined with clinical observations, patient history, and epidemiological information. The expected result is Negative.  Fact Sheet for Patients:  EntrepreneurPulse.com.au  Fact Sheet for Healthcare Providers:  IncredibleEmployment.be  This test is no t yet approved or cleared by the [place] FDA and  has been authorized for detection and/or diagnosis of SARS-CoV-2 by FDA under an Emergency Use Authorization (EUA). This EUA will remain  in effect (meaning this test can be used) for the duration of the COVID-19 declaration under Section 564(b)(1) of the Act, 21 U.S.C.section 360bbb-3(b)(1), unless the authorization is terminated  or revoked sooner.       Influenza A by PCR NEGATIVE NEGATIVE Final   Influenza B by PCR NEGATIVE NEGATIVE Final    Comment: (NOTE) The Xpert Xpress SARS-CoV-2/FLU/RSV plus assay is intended as an aid in the diagnosis of influenza from Nasopharyngeal swab specimens and should not be used as a sole basis for treatment. Nasal washings and aspirates are unacceptable for Xpert Xpress SARS-CoV-2/FLU/RSV testing.  Fact Sheet for Patients: EntrepreneurPulse.com.au  Fact Sheet for Healthcare Providers: IncredibleEmployment.be  This test is not  yet approved or cleared by the [place] FDA and has been authorized for detection and/or diagnosis of SARS-CoV-2 by FDA under an Emergency Use Authorization (EUA). This EUA will remain in effect (meaning this test can be used) for the duration of the COVID-19 declaration under Section 564(b)(1) of the Act, 21 U.S.C. section 360bbb-3(b)(1), unless the authorization is terminated or revoked.  Performed at [facility], [place] [address] [place], [place] [address]   Culture, blood (routine x 2)     Status: None (Preliminary result)   Collection Time: 10/06/21  2:57 PM   Specimen: BLOOD  Result Value Ref Range Status   Specimen Description   Final    BLOOD BLOOD LEFT WRIST Performed at [facility] [address], [place], [place] [address]    Special Requests   Final    BOTTLES DRAWN AEROBIC AND ANAEROBIC Blood Culture adequate volume Performed at [facility] [address] [place],  [address]    Culture   Final    NO GROWTH 2 DAYS Performed at [facility], [place] [address], [place], [place] [address]    Report Status PENDING  Incomplete  Respiratory (~20 pathogens) panel by PCR     Status: Abnormal   Collection Time: 10/06/21  4:29 PM   Specimen: Nasopharyngeal Swab; Respiratory  Result Value Ref Range Status   Adenovirus NOT DETECTED NOT DETECTED Final   Coronavirus 229E NOT DETECTED NOT DETECTED Final    Comment: (NOTE) The Coronavirus on the Respiratory Panel, DOES NOT test for the novel  Coronavirus (2019 nCoV)    Coronavirus HKU1 NOT DETECTED NOT DETECTED Final   Coronavirus NL63 NOT DETECTED NOT DETECTED Final   Coronavirus OC43 NOT DETECTED NOT DETECTED Final   Metapneumovirus NOT DETECTED NOT DETECTED Final   Rhinovirus / Enterovirus NOT DETECTED NOT DETECTED Final   Influenza A NOT DETECTED NOT DETECTED Final   Influenza B NOT DETECTED NOT DETECTED Final   Parainfluenza Virus 1 NOT DETECTED  NOT DETECTED Final   Parainfluenza Virus 2 NOT DETECTED NOT DETECTED Final   Parainfluenza Virus 3 NOT DETECTED NOT DETECTED Final   Parainfluenza Virus 4 NOT DETECTED NOT DETECTED Final   Respiratory Syncytial Virus DETECTED (A) NOT DETECTED Final   Bordetella pertussis NOT DETECTED NOT DETECTED Final   Bordetella Parapertussis NOT DETECTED NOT DETECTED Final   Chlamydophila pneumoniae NOT DETECTED NOT DETECTED Final   Mycoplasma pneumoniae NOT DETECTED NOT DETECTED Final    Comment: Performed at [facility], [address] [place], [place] [address]  MRSA Next Gen by PCR, Nasal     Status: None   Collection Time: 10/06/21  5:45 PM   Specimen: Nasal Mucosa; Nasal Swab  Result Value Ref Range Status   MRSA by PCR Next Gen NOT DETECTED NOT DETECTED Final    Comment: (NOTE) The GeneXpert MRSA Assay (FDA approved for NASAL specimens only), is one component of a comprehensive MRSA colonization surveillance program. It is not intended to diagnose MRSA infection nor to guide or monitor treatment for MRSA infections. Test performance is not FDA approved in patients less than 104 years old. Performed at [facility], [place] [address] [place], [place] [address]     Procedures/Studies: CT CHEST W CONTRAST  Result Date: 10/06/2021 CLINICAL DATA:  Cough and weakness. EXAM: CT CHEST WITH CONTRAST TECHNIQUE: Multidetector CT imaging of the chest was performed during intravenous contrast administration. RADIATION DOSE REDUCTION: This exam was performed according to the departmental dose-optimization program which includes automated exposure control, adjustment of the mA and/or kV according to patient size and/or use of iterative reconstruction technique. CONTRAST:  116m OMNIPAQUE IOHEXOL 300 MG/ML  SOLN COMPARISON:  September 12, 2021 FINDINGS: Cardiovascular: A right-sided venous Port-A-Cath is seen. There is moderate severity calcification of the aortic arch, without evidence  of aortic aneurysm. Mild cardiomegaly with marked severity coronary artery calcification. There is a small pericardial effusion. Mediastinum/Nodes: No enlarged mediastinal, hilar, or axillary lymph nodes. Thyroid gland, trachea, and esophagus demonstrate no significant findings. Lungs/Pleura: Mild right middle lobe, lingular and posterior bilateral lower lobe atelectasis and/or infiltrate is noted. There is no evidence of a pleural effusion. No pneumothorax is identified. Upper Abdomen: Surgical clips are seen within the gallbladder fossa. Numerous punctate calcified granulomas are seen scattered throughout the spleen. The left kidney is surgically absent. Musculoskeletal: Diffusely sclerotic osseous structures are seen without suspicious lytic or blastic lesions. Multilevel degenerative changes are seen throughout the thoracic spine. IMPRESSION: 1. Mild right middle lobe, lingular and posterior bilateral lower lobe atelectasis and/or  infiltrate. 2. Mild cardiomegaly with marked severity coronary artery calcification. 3. Small pericardial effusion. 4. Evidence of prior cholecystectomy and left nephrectomy. 5. 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 Electronically Signed   By: Virgina Norfolk M.D.   On: 10/06/2021 17:43   DG Chest Portable 1 View  Result Date: 10/06/2021 CLINICAL DATA:  Shortness of breath. EXAM: PORTABLE CHEST 1 VIEW COMPARISON:  Chest two views 09/12/2021, AP chest 08/31/2021, chest two views 08/19/2021; CTA chest 09/12/2021 FINDINGS: Right chest wall porta catheter tip overlies the central superior vena cava. Surgical clips overlie the right costophrenic angle, unchanged. Cardiac loop recorder again overlies the left hemithorax. Cardiac silhouette is again mildly enlarged. Mediastinal contours are within normal limits with mild-to-moderate calcifications within the aortic arch. There are again patchy airspace opacities within the right mid and lower  lung and left lower lung, similar to 09/12/2021 no definite pleural effusion. No pneumothorax. No acute skeletal abnormality. IMPRESSION: No significant change in patchy nodular like opacities within the right greater than left lower lungs. These are very similar to prior 09/12/2021 hand August 11, 2021 chest radiographs and 09/12/2021 CT. Again this may represent infection/inflammation. Note is made on 09/12/2021 CT there was recommendation for a three-month follow-up CT around 12/12/2021 in this patient with history of invasive bladder carcinoma. Recommend attention on follow-up. Electronically Signed   By: Yvonne Kendall M.D.   On: 10/06/2021 11:07   CT Angio Chest PE W and/or Wo Contrast  Result Date: 09/12/2021 CLINICAL DATA:  Pulmonary embolism (PE) suspected, positive D-dimer. Pt is here for shortness of breath for 2 days as well as a lack of energy. Pt was hospitalized for pneumonia recently (in the last 2 weeks) for pneumonia at [facility] long for 3 days. Pt states that she has hx of afib and exerted EXAM: CT ANGIOGRAPHY CHEST WITH CONTRAST TECHNIQUE: Multidetector CT imaging of the chest was performed using the standard protocol during bolus administration of intravenous contrast. Multiplanar CT image reconstructions and MIPs were obtained to evaluate the vascular anatomy. RADIATION DOSE REDUCTION: This exam was performed according to the departmental dose-optimization program which includes automated exposure control, adjustment of the mA and/or kV according to patient size and/or use of iterative reconstruction technique. CONTRAST:  38m OMNIPAQUE IOHEXOL 350 MG/ML SOLN COMPARISON:  PET CT 07/29/2021 FINDINGS: Cardiovascular: Right chest wall Port-A-Cath with tip terminating at the superior cavoatrial junction. Satisfactory opacification of the pulmonary arteries to the segmental level. No evidence of pulmonary embolism. The main pulmonary artery measures borderline enlarged. Normal heart size. No  significant pericardial effusion. The thoracic aorta is normal in caliber. Mild atherosclerotic plaque of the thoracic aorta. Four-vessel coronary artery calcifications. Mediastinum/Nodes: Borderline enlarged 1.3 cm right hilar lymph node. Prominent but nonenlarged left hilar lymph node. No enlarged mediastinal, left hilar, or axillary lymph nodes. Thyroid gland, trachea, and esophagus demonstrate no significant findings. Lungs/Pleura: Patchy nodular-like airspace opacities within the right lung. No left pulmonary nodule. No pulmonary mass. Trace right pleural effusion. No left pleural effusion. No pneumothorax. Upper Abdomen: Status post cholecystectomy. Punctate calcification within the spleen sequela prior granulomatous disease. Otherwise no acute abnormality. Musculoskeletal: Right breast dermal thickening. Surgical clips overlie interval increase in size and conspicuity of a 2.2 x 1.2 cm (from 1.3 x 0.9 cm) soft tissue density within the right breast. No suspicious lytic or blastic osseous lesions. No acute displaced fracture. Multilevel degenerative changes of the spine. Review of the MIP images confirms the above findings. IMPRESSION: 1. Patchy nodular-like  airspace opacities of the right lung suggestive of infection/inflammation. Recommend follow-up in 3 months to evaluate for resolution. 2. Trace right pleural effusion. 3. Borderline enlarged right hilar lymph node likely reactive in etiology. Recommend attention on follow-up. 4. Right breast dermal thickening. Surgical clips overlie interval increase in size and conspicuity of a 2.2 x 1.2 cm (from 1.3 x 0.9 cm) soft tissue density within the right breast. Correlate with physical exam. If clinically indicated, consider diagnostic mammographic evaluation. Electronically Signed   By: Iven Finn M.D.   On: 09/12/2021 18:34   DG Chest 2 View  Result Date: 09/12/2021 CLINICAL DATA:  Shortness of breath, cough. Recent diagnosis of pneumonia. EXAM: CHEST -  2 VIEW COMPARISON:  August 31, 2021 FINDINGS: EKG leads project over the chest. RIGHT-sided Port-A-Cath tip at the caval to atrial junction. Cardiac loop recorder projects over the chest. Patchy opacities with vaguely nodular appearance persist in the RIGHT chest. Increased perihilar airspace disease adjacent to and inferior to the RIGHT hilum. Cardiomediastinal contours and hilar structures with top-normal heart size. Mild increased interstitial markings perhaps with Kerley B lines. No lobar consolidation. No sign of pleural effusion. On limited assessment no acute skeletal findings. IMPRESSION: 1. Patchy opacities with vaguely nodular appearance in the RIGHT chest. Increased perihilar airspace disease adjacent to and inferior to the RIGHT hilum. While findings could reflect infection, in a patient with history of bladder neoplasm would suggest either close follow-up PA and lateral chest after therapy or chest CT for further evaluation. 2. Query background mild interstitial edema. Electronically Signed   By: Zetta Bills M.D.   On: 09/12/2021 14:38   CUP PACEART REMOTE DEVICE CHECK  Result Date: 09/11/2021 ILR summary report received. Battery status OK. Normal device function. No new symptom, tachy, brady, or pause episodes. Monthly summary reports and ROV/PRN AF episodes/compass up to 12hrs in duration, burden 13.4%, Eliquis, Amiodarone.  No EGM's for review. LA    Time coordinating discharge: Over 15 minutes    Cynthia Dee, MD  [facility] 10/08/2021, 2:19 PM

## 2021-10-09 ENCOUNTER — Inpatient Hospital Stay: Payer: Medicare PPO

## 2021-10-09 LAB — PTH, INTACT AND CALCIUM
Calcium, Total (PTH): 8.1 mg/dL — ABNORMAL LOW (ref 8.7–10.3)
PTH: 22 pg/mL (ref 15–65)

## 2021-10-10 ENCOUNTER — Telehealth: Payer: Self-pay | Admitting: *Deleted

## 2021-10-10 ENCOUNTER — Telehealth: Payer: Self-pay | Admitting: Dietician

## 2021-10-10 NOTE — Telephone Encounter (Signed)
Patient called. She was d/c'd from hospital on Tuesday - was hospitalized with RSV. She had to cancel MRI on Monday. She was supposed to have MRI before appt with Dr. Alen Blew on 10/12. She wants to know if she should reschedule both MRI and MD appt or come to MD appt without MRI?  Dr. Alen Blew informed. Contacted patient with Dr. Hazeline Junker response: She can reschedule MRI in 2 weeks and see him the next week. Patient said she will call about r/s MRI in 2 weeks. Appointment with MD cancelled for 10/12 and schedule message sent to r/s patient to see Dr. Alen Blew in 3 weeks.

## 2021-10-10 NOTE — Telephone Encounter (Signed)
Patient screened on MST. First attempt to reach. Provided my cell# on voice mail  and sent text to mobile to return call to set up a nutrition consult.  April Manson, RDN, LDN Registered Dietitian, [place] Part Time Remote (Usual office hours: Tuesday-Thursday) Cell: [phone]

## 2021-10-11 LAB — CULTURE, BLOOD (ROUTINE X 2)
Culture: NO GROWTH
Culture: NO GROWTH
Special Requests: ADEQUATE

## 2021-10-15 DIAGNOSIS — R531 Weakness: Secondary | ICD-10-CM | POA: Diagnosis not present

## 2021-10-15 DIAGNOSIS — R031 Nonspecific low blood-pressure reading: Secondary | ICD-10-CM | POA: Diagnosis not present

## 2021-10-15 DIAGNOSIS — J205 Acute bronchitis due to respiratory syncytial virus: Secondary | ICD-10-CM | POA: Diagnosis not present

## 2021-10-15 DIAGNOSIS — J988 Other specified respiratory disorders: Secondary | ICD-10-CM | POA: Diagnosis not present

## 2021-10-15 NOTE — Progress Notes (Deleted)
Electrophysiology Office Note Date: 10/15/2021  ID:  Cynthia, [name] 07-Dec-1945, MRN [id number]  PCP: Saintclair Halsted, FNP Primary Cardiologist: Elouise Munroe, MD Electrophysiologist: Virl Axe, MD   CC: ILR follow-up  Cynthia [name] is a 76 y.o. female seen today for Dr. Caryl Comes . she presents today for post hospital follow up.    Admitted ***  Since discharge from hospital the patient reports doing ***.  she denies chest pain, palpitations, dyspnea, PND, orthopnea, nausea, vomiting, dizziness, syncope, edema, weight gain, or early satiety. .  Device History: {Blank single:19197::"Medtronic","St. Jude","[employer]","Biotronik"} loop recorder implanted *** for {Blank single:19197::"Cryptogenic Stroke","Atrial fibrillation","palpitations","syncope"}  Past Medical History:  Diagnosis Date   AICD (automatic cardioverter/defibrillator) present    Medtronic   Anticoagulant long-term use    eliquis--- managed by cardiology   Anxiety    Arthritis    Bladder cancer ([place])    Bradycardia    Breast cancer ([place])    left, s/p lumpectomy, Dr [name]   Cancer of kidney [facility])    left   DDD (degenerative disc disease), lumbar    Depression    Dyspnea    ON EXERTION   First degree heart block    Genetic testing 06/25/2016   Ms. Madrid underwent genetic counseling and testing for hereditary cancer syndromes on 06/17/2016. Her results were negative for mutations in all 46 genes analyzed by [facility]'s 46-gene Common Hereditary Cancers Panel. Genes analyzed include: APC, ATM, AXIN2, BARD1, BMPR1A, BRCA1, BRCA2, BRIP1, CDH1, CDKN2A, CHEK2, CTNNA1, DICER1, EPCAM, GREM1, HOXB13, KIT, MEN1, MLH1, MSH2, MSH3, MSH6, MUTYH, NBN,   GERD (gastroesophageal reflux disease)    occasional,  takes pepcid   Hematuria    History of radiation therapy 05/22/2016 to 06/20/2016   right breast cancer   Hyperlipidemia    Hypertension    followd by pcp---  (02-22-2019 per pt never had stress test)    Hypothyroidism    followed by pcp   IBS (irritable bowel syndrome)    Incomplete right bundle branch block    Insomnia    Malignant neoplasm of upper-inner quadrant of right breast in female, estrogen receptor positive [facility]) oncologist--- dr [name]   dx 03/ 2018,  Stage IA, IDC, ER/PR +;  04-01-2016 s/p  right breast lumpectomy w/ node dissection's;  completed radiation 06-20-2016   MVP (mitral valve prolapse)    per echo 12-11-2018 in epic, mild    NSVT (nonsustained ventricular tachycardia) ([place]) followed by cardiology   12-10-2018  hospital admission ,  refer to discharge note 12-14-2018 for treatement   PAF (paroxysmal atrial fibrillation) [facility]) primary cardiologist--- dr Margaretann Loveless   newly dx 12-10-2018  admission in epic, Afib w/ RVR and NSVT;   in epic TTE 12-11-2018 showed ef 50-55%, mild MVP,  mild AV sclerosis without stenosis;   Cardiac MRI 12-13-2018 in epic   Prediabetes    Renal mass, left    pelvis    Restless leg syndrome    occasional   RLS (restless legs syndrome)    Type 2 diabetes mellitus ([place])    followed by pcp---  (02-22-2019 does not check blood sugar's)   Past Surgical History:  Procedure Laterality Date   BREAST LUMPECTOMY WITH RADIOACTIVE SEED AND SENTINEL LYMPH NODE BIOPSY Right 04/01/2016   Procedure: RADIOACTIVE SEED GUIDED RIGHT BREAST LUMPECTOMY WITH RIGHT AXILLARY SENTINEL LYMPH NODE BIOPSY.;  Surgeon: Fanny Skates, MD;  Location: [place];  Service: General;  Laterality: Right;   CARDIAC CATHETERIZATION  12/11/2018   CATARACT  EXTRACTION W/ INTRAOCULAR LENS  IMPLANT, BILATERAL  1980s   COLONOSCOPY     CYSTOSCOPY W/ RETROGRADES Right 06/05/2021   Procedure: CYSTOSCOPY WITH RETROGRADE PYELOGRAM;  Surgeon: Alexis Frock, MD;  Location: [facility];  Service: Urology;  Laterality: Right;   CYSTOSCOPY/RETROGRADE/URETEROSCOPY N/A 03/02/2019   Procedure: CYSTOSCOPY/LEFT RETROGRADE/URETEROSCOPY/  BIOPSY/  STENT;  Surgeon: Ceasar Mons, MD;  Location:  [facility];  Service: Urology;  Laterality: N/A;   INCISIONAL HERNIA REPAIR N/A 03/29/2020   Procedure: REPAIR OF INCISIONAL HERNIA WITH MESH;  Surgeon: Erroll Luna, MD;  Location: [place];  Service: General;  Laterality: N/A;   INSERTION OF ICD     medtronic   IR IMAGING GUIDED PORT INSERTION  07/18/2021   LAPAROSCOPIC CHOLECYSTECTOMY  1990s   ROBOT ASSITED LAPAROSCOPIC NEPHROURETERECTOMY Left 04/27/2019   Procedure: XI ROBOT ASSITED LAPAROSCOPIC NEPHROURETERECTOMY;  Surgeon: Alexis Frock, MD;  Location: [facility];  Service: Urology;  Laterality: Left;  3.5 HRS   TONSILLECTOMY  age 7   TRANSURETHRAL RESECTION OF BLADDER TUMOR N/A 06/05/2021   Procedure: TRANSURETHRAL RESECTION OF BLADDER TUMOR (TURBT);  Surgeon: Alexis Frock, MD;  Location: [facility];  Service: Urology;  Laterality: N/A;    Current Outpatient Medications  Medication Sig Dispense Refill   acetaminophen (TYLENOL) 500 MG tablet Take 1,000 mg by mouth every 6 (six) hours as needed for headache or mild pain.     albuterol (VENTOLIN HFA) 108 (90 Base) MCG/ACT inhaler Inhale 2 puffs into the lungs every 4 (four) hours as needed for wheezing or shortness of breath. 18 g 1   ALPRAZolam (XANAX) 1 MG tablet Take 0.5-1 mg by mouth at bedtime. Take 1 mg by mouth at 11 PM and an additional 0.5 mg four hours later if needed for unresolved sleeplessness     amiodarone (PACERONE) 100 MG tablet Take 2 tablets (200 mg total) by mouth daily. 90 tablet 3   anastrozole (ARIMIDEX) 1 MG tablet TAKE 1 TABLET BY MOUTH EVERY DAY (Patient taking differently: Take 1 mg by mouth in the morning.) 90 tablet 4   apixaban (ELIQUIS) 5 MG TABS tablet TAKE 1 TABLET BY MOUTH TWICE A DAY (Patient taking differently: Take 5 mg by mouth 2 (two) times daily.) 60 tablet 5   Cholecalciferol (VITAMIN D-3) 25 MCG (1000 UT) CAPS Take 1,000 Units by mouth daily with breakfast.     Choline Fenofibrate (FENOFIBRIC ACID) 135 MG CPDR Take 135 mg by mouth daily.       ferrous sulfate 325 (65 FE) MG tablet Take 325 mg by mouth at bedtime.     FIBER PO Take 6-7 tablets by mouth daily.     fluconazole (DIFLUCAN) 150 MG tablet Take 1 tablet (150 mg total) by mouth once as needed for up to 1 dose (yeast infection). 1 tablet 0   levothyroxine (SYNTHROID, LEVOTHROID) 112 MCG tablet Take 112 mcg by mouth daily.     losartan (COZAAR) 25 MG tablet Take 25 mg by mouth daily.     pantoprazole (PROTONIX) 40 MG tablet Take 40 mg by mouth 2 (two) times daily before a meal.     traZODone (DESYREL) 100 MG tablet Take 100 mg by mouth See admin instructions. Take 100 mg by mouth at 11 PM every night     No current facility-administered medications for this visit.    Allergies:   Patient has no known allergies.   Social History: Social History   Socioeconomic History   Marital status: Married  Spouse name: Not on file   Number of children: Not on file   Years of education: Not on file   Highest education level: Not on file  Occupational History   Not on file  Tobacco Use   Smoking status: Never   Smokeless tobacco: Never  Vaping Use   Vaping Use: Never used  Substance and Sexual Activity   Alcohol use: Not Currently    Comment: maybe a mixed drink but rarely, maybe 2 in the last year   Drug use: Never   Sexual activity: Not on file  Other Topics Concern   Not on file  Social History Narrative   Caffeine: no more than 1 coke per day    Social Determinants of Health   Financial Resource Strain: Not on file  Food Insecurity: No Food Insecurity (10/07/2021)   Hunger Vital Sign    Worried About Running Out of Food in the Last Year: Never true    Ran Out of Food in the Last Year: Never true  Transportation Needs: No Transportation Needs (10/07/2021)   PRAPARE - Hydrologist (Medical): No    Lack of Transportation (Non-Medical): No  Physical Activity: Not on file  Stress: Not on file  Social Connections: Not on file   Intimate Partner Violence: Not At Risk (10/07/2021)   Humiliation, Afraid, Rape, and Kick questionnaire    Fear of Current or Ex-Partner: No    Emotionally Abused: No    Physically Abused: No    Sexually Abused: No    Family History: Family History  Problem Relation Age of Onset   Congestive Heart Failure Mother    Breast cancer Sister 90       d.54   Cervical cancer Sister 61   Leukemia Brother 54   Leukemia Son    Ovarian cancer Maternal Aunt 92       d.92s   Breast cancer Other 45   Restless legs syndrome Neg Hx    Sleep apnea Neg Hx      Review of Systems: All other systems reviewed and are otherwise negative except as noted above.  Physical Exam: There were no vitals filed for this visit.   GEN- The patient is well appearing, alert and oriented x 3 today.   HEENT: normocephalic, atraumatic; sclera clear, conjunctiva pink; hearing intact; oropharynx clear; neck supple  Lungs- Clear to ausculation bilaterally, normal work of breathing.  No wheezes, rales, rhonchi Heart- Regular rate and rhythm, no murmurs, rubs or gallops  GI- soft, non-tender, non-distended, bowel sounds present  Extremities- no clubbing, cyanosis, or edema  MS- no significant deformity or atrophy Skin- warm and dry, no rash or lesion; ILR pocket well healed Psych- euthymic mood, full affect Neuro- strength and sensation are intact  PPM Interrogation- reviewed in detail today,  See PACEART report  EKG:  EKG {ACTION; IS/IS IOX:[id number]} ordered today. The ekg ordered today shows ***  Recent Labs: 09/01/2021: TSH 2.289 10/07/2021: ALT 18 10/08/2021: BUN 16; Creatinine, Ser 0.80; Hemoglobin 9.3; Magnesium 2.1; Platelets 322; Potassium 5.1; Sodium 138   Wt Readings from Last 3 Encounters:  10/07/21 160 lb 11.2 oz (72.9 kg)  09/25/21 161 lb 8 oz (73.3 kg)  09/19/21 167 lb (75.8 kg)     Other studies Reviewed: Additional studies/ records that were reviewed today include: Echo *** shows LVEF ***,  Previous EP office notes, Previous remote checks, Most recent labwork.   Assessment and Plan:  1. Syncope s/p Medtronic  Loop recorder Normal device function See Pace Art report No changes today  2. 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   3. HTN Stable on current regimen   4. Paroxysmal Atrial Fibrillation  Burden ***  by device.  Continue Eliquis for CHA2DS2VASC of at least 5 Continue Amiodarone as above         Current medicines are reviewed at length with the patient today.   The patient {ACTIONS; HAS/DOES NOT HAVE:19233} concerns regarding her medicines.  The following changes were made today:  {NONE DEFAULTED:18576}  Labs/ tests ordered today include: *** No orders of the defined types were placed in this encounter.    Disposition:   Follow up with {EPMDS:28135}  in *** {Blank single:19197::"Months","Weeks"}    Signed, Shirley Friar, PA-C  10/15/2021 9:06 AM  [facility] [address] [place] [address] [phone] (office) [phone] (fax)

## 2021-10-17 ENCOUNTER — Inpatient Hospital Stay: Payer: Medicare PPO | Admitting: Oncology

## 2021-10-17 ENCOUNTER — Encounter: Payer: Medicare PPO | Admitting: Student

## 2021-10-17 LAB — CUP PACEART REMOTE DEVICE CHECK
Date Time Interrogation Session: 20231008232910
Implantable Pulse Generator Implant Date: 20210521

## 2021-10-21 ENCOUNTER — Ambulatory Visit (INDEPENDENT_AMBULATORY_CARE_PROVIDER_SITE_OTHER): Payer: Medicare PPO

## 2021-10-21 DIAGNOSIS — R55 Syncope and collapse: Secondary | ICD-10-CM | POA: Diagnosis not present

## 2021-10-22 DIAGNOSIS — J205 Acute bronchitis due to respiratory syncytial virus: Secondary | ICD-10-CM | POA: Diagnosis not present

## 2021-10-22 DIAGNOSIS — J988 Other specified respiratory disorders: Secondary | ICD-10-CM | POA: Diagnosis not present

## 2021-10-24 ENCOUNTER — Telehealth: Payer: Self-pay | Admitting: Dietician

## 2021-10-24 ENCOUNTER — Ambulatory Visit (HOSPITAL_COMMUNITY)
Admission: RE | Admit: 2021-10-24 | Discharge: 2021-10-24 | Disposition: A | Discharge status: Home / Self Care | Payer: Medicare PPO | Source: Ambulatory Visit | Attending: Oncology | Admitting: Oncology

## 2021-10-24 DIAGNOSIS — I700 Atherosclerosis of aorta: Secondary | ICD-10-CM | POA: Diagnosis not present

## 2021-10-24 DIAGNOSIS — I2510 Atherosclerotic heart disease of native coronary artery without angina pectoris: Secondary | ICD-10-CM | POA: Diagnosis not present

## 2021-10-24 DIAGNOSIS — E041 Nontoxic single thyroid nodule: Secondary | ICD-10-CM | POA: Diagnosis not present

## 2021-10-24 DIAGNOSIS — K439 Ventral hernia without obstruction or gangrene: Secondary | ICD-10-CM | POA: Diagnosis not present

## 2021-10-24 DIAGNOSIS — Z853 Personal history of malignant neoplasm of breast: Secondary | ICD-10-CM | POA: Diagnosis not present

## 2021-10-24 DIAGNOSIS — C679 Malignant neoplasm of bladder, unspecified: Secondary | ICD-10-CM | POA: Insufficient documentation

## 2021-10-24 LAB — GLUCOSE, CAPILLARY: Glucose-Capillary: 82 mg/dL (ref 70–99)

## 2021-10-24 MED ORDER — FLUDEOXYGLUCOSE F - 18 (FDG) INJECTION
7.5000 | Freq: Once | INTRAVENOUS | Status: AC
  Administered 2021-10-24: 7.5 via INTRAVENOUS

## 2021-10-24 NOTE — Telephone Encounter (Signed)
Patient screened on MST. Second attempt to reach, spouse picked up he was very sick and couldn't take down #, let him know I would try to reach Tuesday for a telephone nutrition consult.  April Manson, RDN, LDN Registered Dietitian, [place] Part Time Remote (Usual office hours: Tuesday-Thursday) Cell: [phone]

## 2021-10-29 ENCOUNTER — Telehealth: Payer: Self-pay | Admitting: *Deleted

## 2021-10-29 ENCOUNTER — Ambulatory Visit: Payer: Medicare PPO | Admitting: Dietician

## 2021-10-29 NOTE — Telephone Encounter (Signed)
PC to patient, no answer, left VM - informed patient Dr Alen Blew will discuss this with her at her next appointment on 11/07/21.  Instructed patient to call this office with any further questions/concerns, [phone].

## 2021-10-29 NOTE — Telephone Encounter (Signed)
-----   Message from Wyatt Portela, MD sent at 10/29/2021  7:01 AM EDT ----- I didn't proscribe that for her. I will discuss next visit ----- Message ----- From: Rolene Course, RN Sent: 10/28/2021   3:16 PM EDT To: Wyatt Portela, MD  This patient called & is asking if she needs to continue her arimidex due to her breast CA hx.  If so she needs a refill.

## 2021-10-29 NOTE — Progress Notes (Signed)
Third and final attempt to reach patient by telephone. Have left messages with return number. Please consult RD for future needs.    April Manson, RDN, LDN Registered Dietitian, [place] Part Time Remote (Usual office hours: Tuesday-Thursday) Mobile: [phone] Remote Office: [phone]

## 2021-10-31 ENCOUNTER — Inpatient Hospital Stay: Payer: Medicare PPO

## 2021-11-07 ENCOUNTER — Other Ambulatory Visit: Payer: Self-pay

## 2021-11-07 ENCOUNTER — Inpatient Hospital Stay: Payer: Medicare PPO | Attending: Oncology | Admitting: Oncology

## 2021-11-07 ENCOUNTER — Inpatient Hospital Stay: Payer: Medicare PPO

## 2021-11-07 VITALS — BP 125/73 | HR 68 | Temp 97.8°F | Resp 17 | Ht 65.5 in | Wt 158.9 lb

## 2021-11-07 DIAGNOSIS — C679 Malignant neoplasm of bladder, unspecified: Secondary | ICD-10-CM | POA: Diagnosis not present

## 2021-11-07 DIAGNOSIS — Z803 Family history of malignant neoplasm of breast: Secondary | ICD-10-CM | POA: Insufficient documentation

## 2021-11-07 DIAGNOSIS — Z7901 Long term (current) use of anticoagulants: Secondary | ICD-10-CM | POA: Diagnosis not present

## 2021-11-07 DIAGNOSIS — E041 Nontoxic single thyroid nodule: Secondary | ICD-10-CM | POA: Diagnosis not present

## 2021-11-07 DIAGNOSIS — Z95828 Presence of other vascular implants and grafts: Secondary | ICD-10-CM

## 2021-11-07 DIAGNOSIS — Z79811 Long term (current) use of aromatase inhibitors: Secondary | ICD-10-CM | POA: Diagnosis not present

## 2021-11-07 DIAGNOSIS — K439 Ventral hernia without obstruction or gangrene: Secondary | ICD-10-CM | POA: Insufficient documentation

## 2021-11-07 DIAGNOSIS — I2510 Atherosclerotic heart disease of native coronary artery without angina pectoris: Secondary | ICD-10-CM | POA: Insufficient documentation

## 2021-11-07 DIAGNOSIS — I700 Atherosclerosis of aorta: Secondary | ICD-10-CM | POA: Insufficient documentation

## 2021-11-07 DIAGNOSIS — Z79890 Hormone replacement therapy (postmenopausal): Secondary | ICD-10-CM | POA: Diagnosis not present

## 2021-11-07 DIAGNOSIS — C689 Malignant neoplasm of urinary organ, unspecified: Secondary | ICD-10-CM

## 2021-11-07 DIAGNOSIS — Z79899 Other long term (current) drug therapy: Secondary | ICD-10-CM | POA: Diagnosis not present

## 2021-11-07 LAB — CBC WITH DIFFERENTIAL (CANCER CENTER ONLY)
Abs Immature Granulocytes: 0.02 10*3/uL (ref 0.00–0.07)
Basophils Absolute: 0 10*3/uL (ref 0.0–0.1)
Basophils Relative: 1 %
Eosinophils Absolute: 0.2 10*3/uL (ref 0.0–0.5)
Eosinophils Relative: 6 %
HCT: 33 % — ABNORMAL LOW (ref 36.0–46.0)
Hemoglobin: 10.8 g/dL — ABNORMAL LOW (ref 12.0–15.0)
Immature Granulocytes: 1 %
Lymphocytes Relative: 39 %
Lymphs Abs: 1.4 10*3/uL (ref 0.7–4.0)
MCH: 32.6 pg (ref 26.0–34.0)
MCHC: 32.7 g/dL (ref 30.0–36.0)
MCV: 99.7 fL (ref 80.0–100.0)
Monocytes Absolute: 0.4 10*3/uL (ref 0.1–1.0)
Monocytes Relative: 12 %
Neutro Abs: 1.5 10*3/uL — ABNORMAL LOW (ref 1.7–7.7)
Neutrophils Relative %: 41 %
Platelet Count: 249 10*3/uL (ref 150–400)
RBC: 3.31 MIL/uL — ABNORMAL LOW (ref 3.87–5.11)
RDW: 16.4 % — ABNORMAL HIGH (ref 11.5–15.5)
WBC Count: 3.5 10*3/uL — ABNORMAL LOW (ref 4.0–10.5)
nRBC: 0 % (ref 0.0–0.2)

## 2021-11-07 LAB — CMP (CANCER CENTER ONLY)
ALT: 11 U/L (ref 0–44)
AST: 20 U/L (ref 15–41)
Albumin: 3.5 g/dL (ref 3.5–5.0)
Alkaline Phosphatase: 36 U/L — ABNORMAL LOW (ref 38–126)
Anion gap: 6 (ref 5–15)
BUN: 16 mg/dL (ref 8–23)
CO2: 29 mmol/L (ref 22–32)
Calcium: 8.8 mg/dL — ABNORMAL LOW (ref 8.9–10.3)
Chloride: 105 mmol/L (ref 98–111)
Creatinine: 1.13 mg/dL — ABNORMAL HIGH (ref 0.44–1.00)
GFR, Estimated: 50 mL/min — ABNORMAL LOW (ref >60)
Glucose, Bld: 118 mg/dL — ABNORMAL HIGH (ref 70–99)
Potassium: 3.6 mmol/L (ref 3.5–5.1)
Sodium: 140 mmol/L (ref 135–145)
Total Bilirubin: 0.5 mg/dL (ref 0.3–1.2)
Total Protein: 6.5 g/dL (ref 6.5–8.1)

## 2021-11-07 MED ORDER — SODIUM CHLORIDE 0.9% FLUSH
10.0000 mL | Freq: Once | INTRAVENOUS | Status: AC
  Administered 2021-11-07: 10 mL

## 2021-11-07 MED ORDER — HEPARIN SOD (PORK) LOCK FLUSH 100 UNIT/ML IV SOLN
500.0000 [IU] | Freq: Once | INTRAVENOUS | Status: AC
  Administered 2021-11-07: 500 [IU]

## 2021-11-07 NOTE — Progress Notes (Addendum)
Hematology and Oncology Follow Up Visit  Cynthia [name] [id number] 06-29-1945 76 y.o. 11/07/2021 11:47 AM Cynthia [name], Cynthia Knapp, FNP   Principle Diagnosis: 76 year old woman with bladder cancer diagnosed in May 2023.  She was found to have T2N0 multifocal high-grade urothelial carcinoma.     Prior Therapy:  She is status post left nephro ureterectomy in 2021.  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   Carboplatin and gemcitabine started on July 17, 2021.  She completed cycle 4 of therapy in September 2023  Current therapy: Under evaluation for radical cystectomy.  Interim History: [name] returns today for a follow-up.  Since last visit, she was diagnosed with pneumonia and required hospitalization in the end of September.  Her respiratory status has improved at this time.  She is no longer reporting any shortness of breath cough or wheezing.  He denies any nausea, vomiting or abdominal pain.  She denies any residual complications related to chemotherapy.     Medications: Reviewed without changes. Current Outpatient Medications  Medication Sig Dispense Refill   acetaminophen (TYLENOL) 500 MG tablet Take 1,000 mg by mouth every 6 (six) hours as needed for headache or mild pain.     albuterol (VENTOLIN HFA) 108 (90 Base) MCG/ACT inhaler Inhale 2 puffs into the lungs every 4 (four) hours as needed for wheezing or shortness of breath. 18 g 1   ALPRAZolam (XANAX) 1 MG tablet Take 0.5-1 mg by mouth at bedtime. Take 1 mg by mouth at 11 PM and an additional 0.5 mg four hours later if needed for unresolved sleeplessness     amiodarone (PACERONE) 100 MG tablet Take 2 tablets (200 mg total) by mouth daily. 90 tablet 3   anastrozole (ARIMIDEX) 1 MG tablet TAKE 1 TABLET BY MOUTH EVERY DAY (Patient taking differently: Take 1 mg by mouth in the morning.) 90 tablet 4   apixaban (ELIQUIS) 5 MG TABS tablet TAKE 1 TABLET BY MOUTH TWICE A DAY (Patient  taking differently: Take 5 mg by mouth 2 (two) times daily.) 60 tablet 5   Cholecalciferol (VITAMIN D-3) 25 MCG (1000 UT) CAPS Take 1,000 Units by mouth daily with breakfast.     Choline Fenofibrate (FENOFIBRIC ACID) 135 MG CPDR Take 135 mg by mouth daily.      ferrous sulfate 325 (65 FE) MG tablet Take 325 mg by mouth at bedtime.     FIBER PO Take 6-7 tablets by mouth daily.     fluconazole (DIFLUCAN) 150 MG tablet Take 1 tablet (150 mg total) by mouth once as needed for up to 1 dose (yeast infection). 1 tablet 0   levothyroxine (SYNTHROID, LEVOTHROID) 112 MCG tablet Take 112 mcg by mouth daily.     losartan (COZAAR) 25 MG tablet Take 25 mg by mouth daily.     pantoprazole (PROTONIX) 40 MG tablet Take 40 mg by mouth 2 (two) times daily before a meal.     traZODone (DESYREL) 100 MG tablet Take 100 mg by mouth See admin instructions. Take 100 mg by mouth at 11 PM every night     No current facility-administered medications for this visit.     Allergies: No Known Allergies    Physical Exam:  Blood pressure 125/73, pulse 68, temperature 97.8 F (36.6 C), temperature source Temporal, resp. rate 17, height 5' 5.5" (1.664 m), weight 158 lb 14.4 oz (72.1 kg), SpO2 100 %.   ECOG: 1   General appearance: Alert, awake without  any distress. Head: Atraumatic without abnormalities Oropharynx: Without any thrush or ulcers. Eyes: No scleral icterus. Lymph nodes: No lymphadenopathy noted in the cervical, supraclavicular, or axillary nodes Heart:regular rate and rhythm, without any murmurs or gallops.   Lung: Clear to auscultation without any rhonchi, wheezes or dullness to percussion. Abdomin: Soft, nontender without any shifting dullness or ascites. Musculoskeletal: No clubbing or cyanosis. Neurological: No motor or sensory deficits. Skin: No rashes or lesions.        Lab Results: Lab Results  Component Value Date   WBC 5.0 10/08/2021   HGB 9.3 (L) 10/08/2021   HCT 28.6 (L)  10/08/2021   MCV 102.5 (H) 10/08/2021   PLT 322 10/08/2021     Chemistry      Component Value Date/Time   NA 138 10/08/2021 0404   NA 128 (L) 05/27/2019 1151   NA 141 03/19/2016 1216   K 5.1 10/08/2021 0404   K 3.5 03/19/2016 1216   CL 101 10/08/2021 0404   CO2 32 10/08/2021 0404   CO2 29 03/19/2016 1216   BUN 16 10/08/2021 0404   BUN 15 05/27/2019 1151   BUN 19.2 03/19/2016 1216   CREATININE 0.80 10/08/2021 0404   CREATININE 0.94 09/25/2021 1408   CREATININE 1.4 (H) 03/19/2016 1216      Component Value Date/Time   CALCIUM 8.8 (L) 10/08/2021 0404   CALCIUM 8.1 (L) 10/06/2021 2021   CALCIUM 9.9 03/19/2016 1216   ALKPHOS 37 (L) 10/07/2021 0255   ALKPHOS 45 03/19/2016 1216   AST 30 10/07/2021 0255   AST 29 09/25/2021 1408   AST 15 03/19/2016 1216   ALT 18 10/07/2021 0255   ALT 16 09/25/2021 1408   ALT 10 03/19/2016 1216   BILITOT 0.5 10/07/2021 0255   BILITOT 0.7 09/25/2021 1408   BILITOT 0.42 03/19/2016 1216     Narrative & Impression  CLINICAL DATA:  Subsequent treatment strategy for multifocal invasive bladder cancer diagnosed May 2023. History of left nephroureterectomy in 2021 for left renal pelvis urothelial carcinoma. History of breast cancer.   EXAM: NUCLEAR MEDICINE PET SKULL BASE TO THIGH   TECHNIQUE: 7.5 mCi F-18 FDG was injected intravenously. Full-ring PET imaging was performed from the skull base to thigh after the radiotracer. CT data was obtained and used for attenuation correction and anatomic localization.   Fasting blood glucose: 82 mg/dl   COMPARISON:  07/29/2021 PET-CT.   FINDINGS: Mediastinal blood pool activity: SUV max 2.7   Liver activity: SUV max NA   NECK: No hypermetabolic lymph nodes in the neck. Hypermetabolic 1.8 cm slightly hyperdense left thyroid nodule with max SUV 6.0, unchanged.   Incidental CT findings: Right internal jugular Port-A-Cath terminates at the cavoatrial junction. New fluid level in the right maxillary  sinus and left sphenoid sinus with mucoperiosteal thickening in the sphenoid sinus, compatible with acute sinusitis.   CHEST: No enlarged or hypermetabolic axillary, mediastinal or hilar lymph nodes. No hypermetabolic pulmonary findings.   Incidental CT findings: Right breast surgical clips again noted. Subcutaneous loop recorder in the medial ventral upper left chest wall. Chronic mildly patulous thoracic esophagus filled with fluid and debris, similar. Coronary atherosclerosis. Atherosclerotic nonaneurysmal thoracic aorta. Subcentimeter calcified posterior left lower lobe granulomas unchanged. Mild patchy subpleural reticulation in the anterior mid to upper right lung, unchanged, compatible with minimal radiation change.   ABDOMEN/PELVIS: No abnormal hypermetabolic activity within the liver, pancreas, adrenal glands, or spleen. No hypermetabolic lymph nodes in the abdomen or pelvis.   Stable postsurgical changes  from left nephro-ureterectomy with no evidence of recurrent mass in the left nephro-ureterectomy bed. No gross bladder mass on the noncontrast CT images.   Moderate sigmoid diverticulosis. New focal hypermetabolism in the sigmoid colon with associated mild focal diverticular wall thickening (series 4/image 169), which may indicate mild sigmoid diverticulitis.   Incidental CT findings: Cholecystectomy. Granulomatous splenic and liver calcifications are unchanged. Atherosclerotic nonaneurysmal abdominal aorta. Chronic small uterine calcifications, probably degenerated fibroids. Moderate fat containing supraumbilical ventral abdominal hernia to the right of midline.   SKELETON: No focal hypermetabolic activity to suggest skeletal metastasis.   Incidental CT findings: None.   IMPRESSION: 1. No evidence of hypermetabolic metastatic disease. 2. No PET-CT evidence of recurrent neoplasm in the left nephro-ureterectomy bed. No gross bladder mass on the noncontrast  CT images. 3. New focal hypermetabolism in the sigmoid colon with associated mild focal diverticular wall thickening, which may indicate mild sigmoid diverticulitis. 4. Hypermetabolic 1.8 cm left thyroid hyperdense nodule, unchanged. Recommend thyroid US and biopsy (ref: J Am Coll Radiol. 2015 Feb;12(2): 143-50). 5. Findings compatible with acute paranasal sinusitis. 6. Chronic findings include: Moderate fat containing supraumbilical ventral abdominal hernia to the right of midline. Patulous fluid and debris filled thoracic esophagus compatible with nonspecific chronic esophageal dysmotility. Coronary atherosclerosis. Aortic Atherosclerosis (ICD10-I70.0).    Impression and Plan:  76 year old with:  1.  Multifocal high-grade papillary urothelial carcinoma of the bladder with muscle invasion diagnosed in May 2023 with T2N0 disease.   She has completed neoadjuvant chemotherapy and currently under evaluation with radical cystectomy and definitive local treatment.  Alternative treatment options to radical cystectomy would be radiation therapy concomitantly with platinum based treatment.  PET scan obtained on October 24, 2021 did not show any evidence of metastatic disease or disease outside of the bladder.  The role of adjuvant nivolumab was also discussed if she has residual disease after cystectomy.     2.  IV access: Port-A-Cath will continue to be flushed periodically.  This can be removed in the future.      3.  Renal function surveillance: Creatinine clearance remains normal after platinum based therapy.  4.  Breast cancer: She completed 5 years of adjuvant endocrine therapy in July 2023.   5.  Thyroid nodule: These will be evaluated after completing her surgery with possible ultrasound and a biopsy.   6.  Follow-up: She will return in the next 2 to 3 months after her surgery.   30  minutes were entered on this encounter.  The time was dedicated laboratory data, reviewing  imaging studies and outlining future plan of care discussion.   Zola Button, MD 11/2/202311:47 AM

## 2021-11-08 ENCOUNTER — Encounter: Payer: Medicare PPO | Admitting: Physician Assistant

## 2021-11-13 NOTE — Progress Notes (Signed)
[employer] Summary Report / Loop Recorder 

## 2021-11-19 DIAGNOSIS — C652 Malignant neoplasm of left renal pelvis: Secondary | ICD-10-CM | POA: Diagnosis not present

## 2021-11-19 DIAGNOSIS — Z905 Acquired absence of kidney: Secondary | ICD-10-CM | POA: Diagnosis not present

## 2021-11-19 DIAGNOSIS — C678 Malignant neoplasm of overlapping sites of bladder: Secondary | ICD-10-CM | POA: Diagnosis not present

## 2021-11-21 DIAGNOSIS — Z23 Encounter for immunization: Secondary | ICD-10-CM | POA: Diagnosis not present

## 2021-11-21 DIAGNOSIS — R634 Abnormal weight loss: Secondary | ICD-10-CM | POA: Diagnosis not present

## 2021-11-21 DIAGNOSIS — C679 Malignant neoplasm of bladder, unspecified: Secondary | ICD-10-CM | POA: Diagnosis not present

## 2021-11-21 DIAGNOSIS — R031 Nonspecific low blood-pressure reading: Secondary | ICD-10-CM | POA: Diagnosis not present

## 2021-11-21 DIAGNOSIS — R630 Anorexia: Secondary | ICD-10-CM | POA: Diagnosis not present

## 2021-11-22 ENCOUNTER — Ambulatory Visit: Payer: Medicare PPO | Attending: Student | Admitting: Cardiology

## 2021-11-22 ENCOUNTER — Encounter: Payer: Self-pay | Admitting: Physician Assistant

## 2021-11-22 VITALS — BP 116/60 | HR 60 | Ht 69.0 in | Wt 158.2 lb

## 2021-11-22 DIAGNOSIS — I10 Essential (primary) hypertension: Secondary | ICD-10-CM | POA: Diagnosis not present

## 2021-11-22 DIAGNOSIS — I4729 Other ventricular tachycardia: Secondary | ICD-10-CM | POA: Diagnosis not present

## 2021-11-22 DIAGNOSIS — Z79899 Other long term (current) drug therapy: Secondary | ICD-10-CM | POA: Diagnosis not present

## 2021-11-22 DIAGNOSIS — I480 Paroxysmal atrial fibrillation: Secondary | ICD-10-CM

## 2021-11-22 DIAGNOSIS — Z4509 Encounter for adjustment and management of other cardiac device: Secondary | ICD-10-CM

## 2021-11-22 DIAGNOSIS — R011 Cardiac murmur, unspecified: Secondary | ICD-10-CM | POA: Diagnosis not present

## 2021-11-22 DIAGNOSIS — Z5181 Encounter for therapeutic drug level monitoring: Secondary | ICD-10-CM | POA: Diagnosis not present

## 2021-11-22 MED ORDER — AMIODARONE HCL 200 MG PO TABS: 200.0000 mg | ORAL_TABLET | Freq: Every day | ORAL | 2 refills | Status: DC

## 2021-11-22 NOTE — Patient Instructions (Addendum)
Medication Instructions:   Your physician recommends that you continue on your current medications as directed. Please refer to the Current Medication list given to you today.  *If you need a refill on your cardiac medications before your next appointment, please call your pharmacy*   Lab Work: TSH TODAY    If you have labs (blood work) drawn today and your tests are completely normal, you will receive your results only by: [facility] (if you have MyChart) OR A paper copy in the mail If you have any lab test that is abnormal or we need to change your treatment, we will call you to review the results.   Testing/Procedures: Your physician has requested that you have an echocardiogram. Echocardiography is a painless test that uses sound waves to create images of your heart. It provides your doctor with information about the size and shape of your heart and how well your heart's chambers and valves are working. This procedure takes approximately one hour. There are no restrictions for this procedure. Please do NOT wear cologne, perfume, aftershave, or lotions (deodorant is allowed). Please arrive 15 minutes prior to your appointment time.      Follow-Up: At [facility], you and your health needs are our priority.  As part of our continuing mission to provide you with exceptional heart care, we have created designated Provider Care Teams.  These Care Teams include your primary Cardiologist (physician) and Advanced Practice Providers (APPs -  Physician Assistants and Nurse Practitioners) who all work together to provide you with the care you need, when you need it.  We recommend signing up for the patient portal called "MyChart".  Sign up information is provided on this After Visit Summary.  MyChart is used to connect with patients for Virtual Visits (Telemedicine).  Patients are able to view lab/test results, encounter notes, upcoming appointments, etc.  Non-urgent messages can  be sent to your provider as well.   To learn more about what you can do with MyChart, go to [email or web address].    Your next appointment:   6 month(s)  The format for your next appointment:   In Person  Provider:   You may see Virl Axe, MD or one of the following Advanced Practice Providers on your designated Care Team:   Tommye Standard, [place] Legrand Como "Jonni Sanger" Chalmers Cater, [place]   Other Instructions   Important Information About Sugar

## 2021-11-22 NOTE — Progress Notes (Signed)
Cardiology Office Note Date:  11/22/2021  Patient ID:  Cynthia [name], Cynthia [name] Sep 26, 1945, MRN [id number] PCP:  Saintclair Halsted, FNP  Cardiologist:  Dr. Margaretann Loveless  Electrophysiologist: Dr. Caryl Comes    Chief Complaint:  AFib  History of Present Illness: Cynthia [name] is a 76 y.o. female with history of HTN, DM type 2, hypothyroidism, and breast cancer s/p lumpectomy/XRT in 2018/ anastrozole, and anxiety, AFib, VT.  12/10/2018 she was sent to the ER via her PMD where she went w/recurrent near syncope there EKG showed new Afib as well as WCT concerning for VT, she was admitted started on diltiazem for rate control in the ER and heparin gtt for anticoagulation, initial labs unrevealing.  Given WCT dilt changed to BB and amiodarone was added. She was observed on telemetry to have a post termination pause, and in AFib isolated pause 5.6 seconds.  Early, this suspect to be her pre-syncope.  Though she described minutes of feeling weak, clammy. EP was called to the case, unclear initially if VT vs aberrancy.  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 TTE noted LVEF 50-55%, no LVH, no WMA, no significant VHD. Verapamil was changed to Toprol.  She continued to have PAfib though rate controlled, no pauses or significant bradycardia. 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 (though Dr. Caryl Comes seemed unconvinced of this with plans for further d/w Dr. Gardiner Rhyme)   She was transitioned to PO amiodarone.  She had SR with PAFib though rates controlled.  She has not had further VT, no bradycardia or pauses.  She was discharged on amiodarone 460m BID until her follow up, planned for amio labs and titration at that time. No ICD was planned at this juncture.   She last saw Dr. KCaryl Comes2/21/23 discussed pre  syncope known for her and felt to be orthostatic with plans to allow some degree of higher BPs, increased personal stress her husband dx with brain ca.  Maintaining SR and her amiodarone > 1040mdaily   May 31/2023 PREOPERATIVE DIAGNOSIS:  Bladder cancer, history of left renal pelvis cancer. PROCEDURES:   1.  Cystoscopy, right retrograde pyelogram, interpretation. 2.  Transurethral resection bladder tumor, volume medium.   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 CTA chest showing patchy nodular-like airspace opacity of the right lung suggestive of inflammation/infection covered for HCAP She had PAFib w/RVR during her stay, some NSVT and her amiodarone increased to 200m67mily No cardiology involvement during this admission Discharged 09/14/21  Admitted 10/06/21 escalating SOB, febrile RVP positive for RSV.  CT chest showed infiltrate involving right middle and lower lobes.  She was started on steroids and admitted for further monitoring. She had good clinical response and was able to be weaned off of oxygen, Not felt to require Ribavarin or escalation of treatment beyond steroids and supportive care -Albuterol and steroids continued at discharge Discharged 10/08/21  Today she presents feeling much better. Received news earlier this week that her bladder ca has responded well to chemo. She is excited, though nervous, about [place] to have some days  where she is more tired than others, is worried that she is in AF those days. She continues to take amiodarone 280m daily and eliquis without concerning bleeding. She has dental appt upcoming and may need clearance to stop for dental work.   No syncope or presyncope. No chest pain,  palpitations, SOB, DOE.    Device information MDT ILR, implanted 05/27/19 for syncope  AAD hx Amiodarone started 2020 for Afib, VT  Past Medical History:  Diagnosis Date   AICD (automatic cardioverter/defibrillator) present    Medtronic   Anticoagulant long-term use    eliquis--- managed by cardiology   Anxiety    Arthritis    Bladder cancer ([facility]    Bradycardia    Breast cancer ([facility]    left, s/p lumpectomy, Dr MJana Hakim  Cancer of kidney ([facility]    left   DDD (degenerative disc disease), lumbar    Depression    Dyspnea    ON EXERTION   First degree heart block    Genetic testing 06/25/2016   Ms. Canale underwent genetic counseling and testing for hereditary cancer syndromes on 06/17/2016. Her results were negative for mutations in all 46 genes analyzed by [employer]'s 46-gene Common Hereditary Cancers Panel. Genes analyzed include: APC, ATM, AXIN2, BARD1, BMPR1A, BRCA1, BRCA2, BRIP1, CDH1, CDKN2A, CHEK2, CTNNA1, DICER1, EPCAM, GREM1, HOXB13, KIT, MEN1, MLH1, MSH2, MSH3, MSH6, MUTYH, NBN,   GERD (gastroesophageal reflux disease)    occasional,  takes pepcid   Hematuria    History of radiation therapy 05/22/2016 to 06/20/2016   right breast cancer   Hyperlipidemia    Hypertension    followd by pcp---  (02-22-2019 per pt never had stress test)   Hypothyroidism    followed by pcp   IBS (irritable bowel syndrome)    Incomplete right bundle branch block    Insomnia    Malignant neoplasm of upper-inner quadrant of right breast in female, estrogen receptor positive ([facility] oncologist--- dr mJana Hakim  dx 03/ 2018,  Stage IA, IDC, ER/PR +;  04-01-2016 s/p  right breast lumpectomy w/ node dissection's;  completed radiation 06-20-2016   MVP (mitral valve prolapse)    per echo 12-11-2018 in epic, mild    NSVT (nonsustained ventricular tachycardia) ([facility] by cardiology   12-10-2018  hospital admission ,  refer to discharge note 12-14-2018 for treatement   PAF (paroxysmal atrial  fibrillation) ([facility] primary cardiologist--- dr aMargaretann Loveless  newly dx 12-10-2018  admission in epic, Afib w/ RVR and NSVT;   in epic TTE 12-11-2018 showed ef 50-55%, mild MVP,  mild AV sclerosis without stenosis;   Cardiac MRI 12-13-2018 in epic   Prediabetes    Renal mass, left    pelvis    Restless leg syndrome    occasional   RLS (restless legs syndrome)    Type 2 diabetes mellitus ([facility]    followed by pcp---  (02-22-2019 does not check blood sugar's)    Past Surgical History:  Procedure Laterality Date   BREAST LUMPECTOMY WITH RADIOACTIVE SEED AND SENTINEL LYMPH NODE BIOPSY Right 04/01/2016   Procedure: RADIOACTIVE SEED GUIDED RIGHT BREAST LUMPECTOMY WITH RIGHT AXILLARY SENTINEL LYMPH NODE BIOPSY.;  Surgeon: HFanny Skates MD;  [facility]  Service: General;  Laterality: Right;   CARDIAC CATHETERIZATION  12/11/2018   CATARACT EXTRACTION W/ INTRAOCULAR LENS  IMPLANT, BILATERAL  1980s   COLONOSCOPY     CYSTOSCOPY W/ RETROGRADES Right 06/05/2021   Procedure: CYSTOSCOPY WITH RETROGRADE PYELOGRAM;  Surgeon: MAlexis Frock MD;  Location: [facility];  Service: Urology;  Laterality: Right;   CYSTOSCOPY/RETROGRADE/URETEROSCOPY N/A 03/02/2019   Procedure: CYSTOSCOPY/LEFT RETROGRADE/URETEROSCOPY/  BIOPSY/  STENT;  Surgeon: Ceasar Mons, MD;  Location: [facility];  Service: Urology;  Laterality: N/A;   INCISIONAL HERNIA REPAIR N/A 03/29/2020   Procedure: REPAIR OF INCISIONAL HERNIA WITH MESH;  Surgeon: Erroll Luna, MD;  Location: [facility];  Service: General;  Laterality: N/A;   INSERTION OF ICD     medtronic   IR IMAGING GUIDED PORT INSERTION  07/18/2021   LAPAROSCOPIC CHOLECYSTECTOMY  1990s   ROBOT ASSITED LAPAROSCOPIC NEPHROURETERECTOMY Left 04/27/2019   Procedure: XI ROBOT ASSITED LAPAROSCOPIC NEPHROURETERECTOMY;  Surgeon: Alexis Frock, MD;  Location: [facility];  Service: Urology;  Laterality: Left;  3.5 HRS   TONSILLECTOMY  age 31   TRANSURETHRAL RESECTION OF  BLADDER TUMOR N/A 06/05/2021   Procedure: TRANSURETHRAL RESECTION OF BLADDER TUMOR (TURBT);  Surgeon: Alexis Frock, MD;  Location: [facility];  Service: Urology;  Laterality: N/A;    Current Outpatient Medications  Medication Sig Dispense Refill   acetaminophen (TYLENOL) 500 MG tablet Take 1,000 mg by mouth at bedtime.     ALPRAZolam (XANAX) 1 MG tablet Take 1 mg by mouth at bedtime. Take 1 mg by mouth at 11 PM and an additional 0.5 mg four hours later if needed for unresolved sleeplessness     apixaban (ELIQUIS) 5 MG TABS tablet TAKE 1 TABLET BY MOUTH TWICE A DAY 60 tablet 5   Cholecalciferol (VITAMIN D-3) 25 MCG (1000 UT) CAPS Take 1,000 Units by mouth daily with breakfast.     Choline Fenofibrate (FENOFIBRIC ACID) 135 MG CPDR Take 135 mg by mouth daily.      ferrous sulfate 325 (65 FE) MG tablet Take 325 mg by mouth at bedtime.     FIBER PO Take 6-7 tablets by mouth as needed (Constipation).     levothyroxine (SYNTHROID, LEVOTHROID) 112 MCG tablet Take 112 mcg by mouth daily.     pantoprazole (PROTONIX) 40 MG tablet Take 40 mg by mouth 2 (two) times daily before a meal.     traZODone (DESYREL) 100 MG tablet Take 1 1/2 tablet by mouth (150 mg Total) at bedtime     amiodarone (PACERONE) 200 MG tablet Take 1 tablet (200 mg total) by mouth daily. 90 tablet 2   No current facility-administered medications for this visit.    Allergies:   Patient has no known allergies.   Social History:  The patient  reports that she has never smoked. She has never used smokeless tobacco. She reports that she does not currently use alcohol. She reports that she does not use drugs.   Family History:  The patient's family history includes Breast cancer (age of onset: 107) in an other family member; Breast cancer (age of onset: 72) in her sister; Cervical cancer (age of onset: 39) in her sister; Congestive Heart Failure in her mother; Leukemia in her son; Leukemia (age of onset: 87) in her brother; Ovarian cancer  (age of onset: 28) in her maternal aunt.  ROS:  Please see the history of present illness.  All other systems are reviewed and otherwise negative.   PHYSICAL EXAM:  VS:  BP 116/60   Pulse 60   Ht _0  (1.753 m)   Wt 158 lb 3.2 oz (71.8 kg)   SpO2 97%   BMI 23.36 kg/m  BMI: Body mass index is 23.36 kg/m. Well nourished, well developed, in no acute distress  HEENT:  normocephalic, atraumatic  Neck: no JVD, carotid bruits or masses Cardiac:  RRR; blowing murmur, loudest on L sternal border, no significant radiation.  Lungs:  CTA, no wheezing, rhonchi or rales  Abd: soft, nontender MS: no deformity or atrophy Ext:  no edema  Skin: warm and dry, no rash Neuro:  No gross deficits appreciated Psych: euthymic mood, full affect  ILR site: stable, no skin changes, tenderness, tethering   EKG:  Not done today   Device interrogation done today and reviewed by myself Good battery AF burden 5.7% Longest episode 52h continuously on 10/1  Mr Cardiac Morphology W Wo Contrast Result Date: 12/13/2018 CLINICAL DATA:  86F presents with AF, NSVT EXAM: CARDIAC MRI TECHNIQUE: The patient was scanned on a 1.5 Tesla Siemens magnet. A dedicated cardiac coil was used. Functional imaging was done using Fiesta sequences. 2,3, and 4 chamber views were done to assess for RWMA's. Modified Simpson's rule using a short axis stack was used to calculate an ejection fraction on a dedicated work Conservation officer, nature. The patient received 8 cc of Gadavist. After 10 minutes inversion recovery sequences were used to assess for infiltration and scar tissue. CONTRAST:  8 cc  of Gadavist FINDINGS: Left ventricle: - Normal size - Mild systolic dysfunction - Ratio noncompacted to compacted myocardium > 2.3 at end-diastole along basal to apical anterior/anterolateral wall. - Inferior RV insertion site LGE LV EF:  52% (Normal 56-78%) Absolute volumes: LV EDV: 141m (Normal 52-141 mL) LV ESV: 653m(Normal 13-51 mL) LV  SV: 7119mNormal 33-97 mL) CO: 4.9L/min (Normal 2.7-6.0 L/min) Indexed volumes: LV EDV: 62m40m-m (Normal 41-81 mL/sq-m) LV ESV: 36mL87mm (Normal 12-21 mL/sq-m) LV SV: 38mL/64m (Normal 26-56 mL/sq-m) CI: 2.6L/min/sq-m (Normal 1.8-3.8 L/min/sq-m) Right ventricle: Normal size and systolic function RV EF: 55% (N64%al 47-80%) Absolute volumes: RV EDV: 131mL (24mal 58-154 mL) RV ESV: 59mL (N87ml 12-68 mL) RV SV: 72mL (No32m 35-98 mL) CO: 5.0L/min (Normal 2.7-6 L/min) Indexed volumes: RV EDV: 70mL/sq-m65mrmal 48-87 mL/sq-m) RV ESV: 32mL/sq-m 70mmal 11-28 mL/sq-m) RV SV: 39mL/sq-m (32mal 27-57 mL/sq-m) CI: 2.7L/min/sq-m (Normal 1.8-3.8 L/min/sq-m) Left atrium: Normal size Right atrium: Mild enlargement Mitral valve: Mild regurgitation Aortic valve: No regurgitation Tricuspid valve: No regurgitation Pericardium: Small to moderate effusion, measuring up to 10mm adjacen63m inferior LV wall IMPRESSION: 1. 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 Inferior RV insertion site late gadolinium enhancement, which is a nonspecific finding often seen in setting of elevated pulmonary pressures but has been described in LV noncompaction 3.  Normal LV size with mildly reduced systolic function (EF 52%) 4.  Norm40%RV size and systolic function (EF 55%) 5. Small34% moderate pericardial effusion, measuring up to 10mm adjacent61minferior LV wall Electronically Signed   By: Christopher  SOswaldo Milian7/2020 21:27     12/11/2018 TTE IMPRESSIONS  1. Left ventricular ejection fraction, by visual estimation, is 50 to 55%. The left ventricle has normal function. There is no left ventricular hypertrophy.  2. Global right ventricle has normal systolic function.The right ventricular size is normal. No increase in right ventricular wall thickness.  3. Left atrial size was normal.  4. Right atrial size was normal.  5. Mild mitral valve prolapse.  6. The mitral valve is normal  in structure. Mild mitral valve regurgitation. No evidence of mitral stenosis.  7. The tricuspid valve is normal in structure. Tricuspid valve regurgitation is trivial.  8. The aortic valve is normal in  structure. Aortic valve regurgitation is trivial. Mild aortic valve sclerosis without stenosis.  9. The pulmonic valve was normal in structure. Pulmonic valve regurgitation is trivial. 10. TR signal is inadequate for assessing pulmonary artery systolic pressure. 11. The inferior vena cava is normal in size with greater than 50% respiratory variability, suggesting right atrial pressure of 3 mmHg.    Recent Labs: 09/01/2021: TSH 2.289 10/08/2021: Magnesium 2.1 11/07/2021: ALT 11; BUN 16; Creatinine 1.13; Hemoglobin 10.8; Platelet Count 249; Potassium 3.6; Sodium 140  No results found for requested labs within last 365 days.   Estimated Creatinine Clearance: 42.9 mL/min (A) (by C-G formula based on SCr of 1.13 mg/dL (H)).   Wt Readings from Last 3 Encounters:  11/22/21 158 lb 3.2 oz (71.8 kg)  11/07/21 158 lb 14.4 oz (72.1 kg)  10/07/21 160 lb 11.2 oz (72.9 kg)     Other studies reviewed: Additional studies/records reviewed today include: summarized above  ASSESSMENT AND PLAN:  1. Paroxysmal AFib w/RVR, Flutter Has been taking amiodarone with relatively good AF control Had one long episode around 10/1 that correlates with pneumonia and hospitalization.  - continue amio at 283m/day Amio labs - needs updated TSH CHA2DS2-VASc Score = 6 (HTN, DM, Vasc Dz, age, gender) AC - eliquis 538m appropriately dosed    2. NSVT  last LVEF normal C.MRI noted above, ? LVNC, Dr. KlCaryl Comess not convinced of this diagnosis continue amiodarone as above      3. New systolic Murmur noted Obtain updated echo, last one in 2020.  Appears euvolemic   4. HTN Appears stable today, known orthostatic hypotension  No changes   Disposition: follow-up in 6 months with Dr. KlCaryl ComesAF clinic or EP app; or  sooner if needed   Current medicines are reviewed at length with the patient today.  The patient did not have any concerns regarding medicines.  Signed, SuMamie LeversNP  [facility]wight[address]annon[address]Reliance[address] [phone])  [phone])

## 2021-11-23 ENCOUNTER — Other Ambulatory Visit: Payer: Self-pay

## 2021-11-23 LAB — TSH: TSH: 0.924 u[IU]/mL (ref 0.450–4.500)

## 2021-11-25 ENCOUNTER — Ambulatory Visit (INDEPENDENT_AMBULATORY_CARE_PROVIDER_SITE_OTHER): Payer: Medicare PPO

## 2021-11-25 DIAGNOSIS — R55 Syncope and collapse: Secondary | ICD-10-CM | POA: Diagnosis not present

## 2021-11-26 LAB — CUP PACEART REMOTE DEVICE CHECK
Date Time Interrogation Session: 20231119232354
Implantable Pulse Generator Implant Date: 20210521

## 2021-12-04 ENCOUNTER — Telehealth: Payer: Self-pay | Admitting: Internal Medicine

## 2021-12-04 NOTE — Telephone Encounter (Signed)
   Pre-operative Risk Assessment    Patient Name: Cynthia [name]  DOB: Oct 08, 1945 MRN: [id number]      Request for Surgical Clearance    Procedure:   Dental Cleaning   Date of Surgery:  Clearance 12/10/21                                 Surgeon:  Dr. Arita Miss Surgeon's Group or Practice Name:   Phone number:  [phone] Fax number:  [phone]   Type of Clearance Requested:   - Medical    Type of Anesthesia:  Local    Additional requests/questions:   Caller stated they would like to know protocol for this patient to have cleanings, extractions and fillings  and when to hold patient's blood thinner.  Signed, Heloise Beecham   12/04/2021, 4:03 PM

## 2021-12-04 NOTE — Telephone Encounter (Signed)
    Primary Cardiologist: Elouise Munroe, MD  Chart reviewed as part of pre-operative protocol coverage. Simple dental extractions are considered low risk procedures per guidelines and generally do not require any specific cardiac clearance. It is also generally accepted that for simple extractions and dental cleanings, there is no need to interrupt blood thinner therapy.   SBE prophylaxis is not required for the patient.  I will route this recommendation to the requesting party via Epic fax function and remove from pre-op pool.  Please call with questions.  Deberah Pelton, NP 12/04/2021, 4:36 PM

## 2021-12-18 ENCOUNTER — Telehealth (HOSPITAL_COMMUNITY): Payer: Self-pay | Admitting: Physician Assistant

## 2021-12-18 NOTE — Telephone Encounter (Signed)
Patient cancelled echocardiogram for the reason below:  12/18/21 PT LVM TO cancel due to her spouse is really sick and in the hospital, she will call back to reschedule/LBW   Order will be removed from the active echo Wq and when patient calls back we will reinstate the order. Thank you.

## 2021-12-19 ENCOUNTER — Other Ambulatory Visit (HOSPITAL_COMMUNITY): Payer: Medicare PPO

## 2021-12-23 ENCOUNTER — Telehealth: Payer: Self-pay | Admitting: Internal Medicine

## 2021-12-23 NOTE — Telephone Encounter (Signed)
Patient c/o Palpitations:  High priority if patient c/o lightheadedness, shortness of breath, or chest pain  How long have you had palpitations/irregular HR/ Afib? Are you having the symptoms now? yes  Are you currently experiencing lightheadedness, SOB or CP? sob  Do you have a history of afib (atrial fibrillation) or irregular heart rhythm? yes  Have you checked your BP or HR? (document readings if available):    Are you experiencing any other symptoms?  Nervousness

## 2021-12-23 NOTE — Telephone Encounter (Signed)
Pt called in to report is having SOB and feels maybe in Atrial fibrillation.  Would like to have a monitor check to determine heart rhythm.    Advised pt will send message to our device clinic to follow up.   Pt has not missed any doses of amiodarone and has missed 1 dose of Eliquis in the past 2 weeks.

## 2021-12-23 NOTE — Telephone Encounter (Signed)
Called pt back advised of ILR check.   Pt reports BP today was 131/88-95-105.  Also, reports is under a lot of stress.  Spouse has brain cancer with possible mets to spine.  Is dealing with this and is experiencing anxiety and insomnia.   Pt does not want to schedule an OV at this time.  Advised if changes her mind to call in to be seen.  Pt thanked me for returning call no further questions or concerns.

## 2021-12-23 NOTE — Telephone Encounter (Signed)
ILR report checked and shows patient is in NSR. No alerts triggered. Routing back to triage for further evaluation.

## 2021-12-31 ENCOUNTER — Ambulatory Visit (INDEPENDENT_AMBULATORY_CARE_PROVIDER_SITE_OTHER): Payer: Medicare PPO

## 2021-12-31 DIAGNOSIS — R55 Syncope and collapse: Secondary | ICD-10-CM | POA: Diagnosis not present

## 2021-12-31 LAB — CUP PACEART REMOTE DEVICE CHECK
Date Time Interrogation Session: 20231225231412
Implantable Pulse Generator Implant Date: 20210521

## 2022-01-03 NOTE — Progress Notes (Signed)
[employer] Summary Report / Loop Recorder 

## 2022-01-07 DIAGNOSIS — E039 Hypothyroidism, unspecified: Secondary | ICD-10-CM | POA: Diagnosis not present

## 2022-01-07 DIAGNOSIS — G2581 Restless legs syndrome: Secondary | ICD-10-CM | POA: Diagnosis not present

## 2022-01-07 DIAGNOSIS — R259 Unspecified abnormal involuntary movements: Secondary | ICD-10-CM | POA: Diagnosis not present

## 2022-01-07 DIAGNOSIS — F439 Reaction to severe stress, unspecified: Secondary | ICD-10-CM | POA: Diagnosis not present

## 2022-01-07 DIAGNOSIS — N1831 Chronic kidney disease, stage 3a: Secondary | ICD-10-CM | POA: Diagnosis not present

## 2022-01-09 ENCOUNTER — Telehealth: Payer: Self-pay

## 2022-01-09 NOTE — Telephone Encounter (Signed)
Alert received from [employer]:  ILR alert report received. Battery status OK. Normal device function. No new symptom, tachy, or brady episodes. Two new AF episodes.  AF burden is 11.4% of the time.   On [place] according to previous report.  There were two pause episodes detected.  Rhythm strip for one true event, ? conversion pause.    Outreach made to Pt.  Per Pt she was asleep at this time, she states she normally sleeps until 11:00 am.  No action needed.

## 2022-01-17 DIAGNOSIS — L84 Corns and callosities: Secondary | ICD-10-CM | POA: Diagnosis not present

## 2022-01-17 DIAGNOSIS — M546 Pain in thoracic spine: Secondary | ICD-10-CM | POA: Diagnosis not present

## 2022-01-27 NOTE — Progress Notes (Signed)
[facility] Summary Report / Loop Recorder

## 2022-01-28 DIAGNOSIS — R051 Acute cough: Secondary | ICD-10-CM | POA: Diagnosis not present

## 2022-01-28 DIAGNOSIS — R509 Fever, unspecified: Secondary | ICD-10-CM | POA: Diagnosis not present

## 2022-01-28 DIAGNOSIS — R52 Pain, unspecified: Secondary | ICD-10-CM | POA: Diagnosis not present

## 2022-01-28 DIAGNOSIS — R5383 Other fatigue: Secondary | ICD-10-CM | POA: Diagnosis not present

## 2022-01-28 DIAGNOSIS — Z03818 Encounter for observation for suspected exposure to other biological agents ruled out: Secondary | ICD-10-CM | POA: Diagnosis not present

## 2022-01-28 DIAGNOSIS — J101 Influenza due to other identified influenza virus with other respiratory manifestations: Secondary | ICD-10-CM | POA: Diagnosis not present

## 2022-02-03 ENCOUNTER — Ambulatory Visit: Payer: Medicare PPO | Attending: Internal Medicine

## 2022-02-03 DIAGNOSIS — R55 Syncope and collapse: Secondary | ICD-10-CM | POA: Diagnosis not present

## 2022-02-04 DIAGNOSIS — F419 Anxiety disorder, unspecified: Secondary | ICD-10-CM | POA: Diagnosis not present

## 2022-02-04 DIAGNOSIS — G4700 Insomnia, unspecified: Secondary | ICD-10-CM | POA: Diagnosis not present

## 2022-02-04 DIAGNOSIS — F331 Major depressive disorder, recurrent, moderate: Secondary | ICD-10-CM | POA: Diagnosis not present

## 2022-02-05 ENCOUNTER — Other Ambulatory Visit: Payer: Self-pay | Admitting: *Deleted

## 2022-02-05 DIAGNOSIS — C689 Malignant neoplasm of urinary organ, unspecified: Secondary | ICD-10-CM

## 2022-02-05 LAB — CUP PACEART REMOTE DEVICE CHECK
Date Time Interrogation Session: 20240127231513
Implantable Pulse Generator Implant Date: 20210521

## 2022-02-06 ENCOUNTER — Inpatient Hospital Stay: Payer: Medicare PPO | Admitting: Oncology

## 2022-02-06 ENCOUNTER — Inpatient Hospital Stay: Payer: Medicare PPO

## 2022-02-07 ENCOUNTER — Ambulatory Visit (HOSPITAL_COMMUNITY): Payer: Medicare PPO | Attending: Physician Assistant

## 2022-02-07 DIAGNOSIS — R011 Cardiac murmur, unspecified: Secondary | ICD-10-CM | POA: Diagnosis not present

## 2022-02-07 LAB — ECHOCARDIOGRAM COMPLETE
AR max vel: 1.6 cm2
AV Area VTI: 1.77 cm2
AV Area mean vel: 1.68 cm2
AV Mean grad: 9 mmHg
AV Peak grad: 16.6 mmHg
Ao pk vel: 2.04 m/s
Area-P 1/2: 3.68 cm2
S' Lateral: 2.2 cm

## 2022-02-10 ENCOUNTER — Other Ambulatory Visit: Payer: Self-pay | Admitting: Oncology

## 2022-02-13 ENCOUNTER — Inpatient Hospital Stay: Payer: Medicare PPO | Admitting: Oncology

## 2022-02-13 ENCOUNTER — Inpatient Hospital Stay: Payer: Medicare PPO

## 2022-02-13 ENCOUNTER — Encounter (HOSPITAL_COMMUNITY): Payer: Self-pay | Admitting: *Deleted

## 2022-02-13 ENCOUNTER — Inpatient Hospital Stay: Payer: Medicare PPO | Attending: Oncology

## 2022-02-13 VITALS — BP 150/74 | HR 62 | Temp 98.1°F | Resp 18 | Ht 69.0 in | Wt 153.4 lb

## 2022-02-13 DIAGNOSIS — E041 Nontoxic single thyroid nodule: Secondary | ICD-10-CM | POA: Insufficient documentation

## 2022-02-13 DIAGNOSIS — Z806 Family history of leukemia: Secondary | ICD-10-CM | POA: Insufficient documentation

## 2022-02-13 DIAGNOSIS — C679 Malignant neoplasm of bladder, unspecified: Secondary | ICD-10-CM | POA: Diagnosis not present

## 2022-02-13 DIAGNOSIS — Z8679 Personal history of other diseases of the circulatory system: Secondary | ICD-10-CM | POA: Insufficient documentation

## 2022-02-13 DIAGNOSIS — Z853 Personal history of malignant neoplasm of breast: Secondary | ICD-10-CM | POA: Insufficient documentation

## 2022-02-13 DIAGNOSIS — E119 Type 2 diabetes mellitus without complications: Secondary | ICD-10-CM | POA: Insufficient documentation

## 2022-02-13 DIAGNOSIS — C689 Malignant neoplasm of urinary organ, unspecified: Secondary | ICD-10-CM

## 2022-02-13 DIAGNOSIS — I4891 Unspecified atrial fibrillation: Secondary | ICD-10-CM | POA: Diagnosis not present

## 2022-02-13 DIAGNOSIS — Q631 Lobulated, fused and horseshoe kidney: Secondary | ICD-10-CM | POA: Diagnosis not present

## 2022-02-13 DIAGNOSIS — K219 Gastro-esophageal reflux disease without esophagitis: Secondary | ICD-10-CM | POA: Insufficient documentation

## 2022-02-13 DIAGNOSIS — Z95828 Presence of other vascular implants and grafts: Secondary | ICD-10-CM

## 2022-02-13 DIAGNOSIS — K589 Irritable bowel syndrome without diarrhea: Secondary | ICD-10-CM | POA: Insufficient documentation

## 2022-02-13 LAB — CBC WITH DIFFERENTIAL (CANCER CENTER ONLY)
Abs Immature Granulocytes: 0.02 10*3/uL (ref 0.00–0.07)
Basophils Absolute: 0.1 10*3/uL (ref 0.0–0.1)
Basophils Relative: 2 %
Eosinophils Absolute: 0.1 10*3/uL (ref 0.0–0.5)
Eosinophils Relative: 4 %
HCT: 35.6 % — ABNORMAL LOW (ref 36.0–46.0)
Hemoglobin: 11.4 g/dL — ABNORMAL LOW (ref 12.0–15.0)
Immature Granulocytes: 1 %
Lymphocytes Relative: 36 %
Lymphs Abs: 1.4 10*3/uL (ref 0.7–4.0)
MCH: 28.9 pg (ref 26.0–34.0)
MCHC: 32 g/dL (ref 30.0–36.0)
MCV: 90.4 fL (ref 80.0–100.0)
Monocytes Absolute: 0.4 10*3/uL (ref 0.1–1.0)
Monocytes Relative: 10 %
Neutro Abs: 1.9 10*3/uL (ref 1.7–7.7)
Neutrophils Relative %: 47 %
Platelet Count: 223 10*3/uL (ref 150–400)
RBC: 3.94 MIL/uL (ref 3.87–5.11)
RDW: 16.2 % — ABNORMAL HIGH (ref 11.5–15.5)
WBC Count: 3.8 10*3/uL — ABNORMAL LOW (ref 4.0–10.5)
nRBC: 0 % (ref 0.0–0.2)

## 2022-02-13 LAB — CMP (CANCER CENTER ONLY)
ALT: 10 U/L (ref 0–44)
AST: 17 U/L (ref 15–41)
Albumin: 3.8 g/dL (ref 3.5–5.0)
Alkaline Phosphatase: 35 U/L — ABNORMAL LOW (ref 38–126)
Anion gap: 8 (ref 5–15)
BUN: 21 mg/dL (ref 8–23)
CO2: 30 mmol/L (ref 22–32)
Calcium: 9.4 mg/dL (ref 8.9–10.3)
Chloride: 104 mmol/L (ref 98–111)
Creatinine: 1.03 mg/dL — ABNORMAL HIGH (ref 0.44–1.00)
GFR, Estimated: 55 mL/min — ABNORMAL LOW (ref >60)
Glucose, Bld: 85 mg/dL (ref 70–99)
Potassium: 4.1 mmol/L (ref 3.5–5.1)
Sodium: 142 mmol/L (ref 135–145)
Total Bilirubin: 0.5 mg/dL (ref 0.3–1.2)
Total Protein: 6.6 g/dL (ref 6.5–8.1)

## 2022-02-13 MED ORDER — SODIUM CHLORIDE 0.9% FLUSH
10.0000 mL | Freq: Once | INTRAVENOUS | Status: AC
  Administered 2022-02-13: 10 mL via INTRAVENOUS

## 2022-02-13 MED ORDER — HEPARIN SOD (PORK) LOCK FLUSH 100 UNIT/ML IV SOLN
500.0000 [IU] | Freq: Once | INTRAVENOUS | Status: AC
  Administered 2022-02-13: 500 [IU] via INTRAVENOUS

## 2022-02-13 NOTE — Progress Notes (Signed)
Declines need for intervention at this time

## 2022-02-13 NOTE — Progress Notes (Signed)
[name]   Diagnosis: Bladder cancer  INTERVAL HISTORY:   Ms. 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  She underwent 4 cycles of neoadjuvant gemcitabine/carboplatin beginning 07/17/2021 and completed cycle 4 in September 2023.  She reports tolerating the chemotherapy well. A PET scan on 10/24/2021 revealed no evidence of hypermetabolic metastatic disease.  There is new focal hypermetabolism in the sigmoid colon associated with diverticular wall thickening.  Unchanged hypermetabolic left thyroid nodule.  She saw Dr. Tresa Moore on 11/19/2021 to discuss treatment options.  They discussed cystectomy, radiation, and observation.  A decision was made to proceed with every 67-monthsurveillance cystoscopy.   Past medical history: G2 P2 Gastroesophageal reflux RSV October 2023 Pneumonia August and September 2023 Diabetes Irritable bowel syndrome Atrial fibrillation History of NSVT Breast cancer March 2019-pT1b pN0, lumpectomy/radiation and Arimidex beginning July 2019  Past surgical history: Cholecystectomy Neurectomy Breast lumpectomy  Family history: Her son has "leukemia "  Social history: She lives [place] her husband, son, and daughter[place]in[place]  She is retired bArt gallery manager  She does not use cigarettes.  Rare alcohol use.  Review of systems: Positives-occasional a.m. nausea, nocturia, intermittent right foot pain, influenza January 2024, easy bruising A complete review of systems was otherwise negative Objective:  Vital signs in last 24 hours:  Blood pressure (!) 150/74, pulse 62, temperature 98.1 F (36.7 C), temperature source Oral,  resp. rate 18, height '5\' 9"'$  (1.753 m), weight 153 lb 6.4 oz (69.6 kg), SpO2 98 %.    HEENT: Neck without mass Lymphatics: No cervical, supraclavicular, axillary, or inguinal nodes Resp: Lungs clear bilaterally Cardio: Regular rate and rhythm GI: Nontender, no mass, no hepatosplenomegaly Vascular: No leg edema Neuro: The motor exam appears intact in the upper and lower extremities bilaterally Skin: No rash  Portacath/PICC-without erythema  Lab Results:  Lab Results  Component Value Date   WBC 3.8 (L) 02/13/2022   HGB 11.4 (L) 02/13/2022   HCT 35.6 (L) 02/13/2022   MCV 90.4 02/13/2022   PLT 223 02/13/2022   NEUTROABS 1.9 02/13/2022    CMP  Lab Results  Component Value Date   NA 140 11/07/2021   K 3.6 11/07/2021   CL 105 11/07/2021   CO2 29 11/07/2021   GLUCOSE 118 (H) 11/07/2021   BUN 16 11/07/2021   CREATININE 1.13 (H) 11/07/2021   CALCIUM 8.8 (L) 11/07/2021   PROT 6.5 11/07/2021   ALBUMIN 3.5 11/07/2021   AST 20 11/07/2021   ALT 11 11/07/2021   ALKPHOS 36 (L) 11/07/2021   BILITOT 0.5 11/07/2021   GFRNONAA 50 (L) 11/07/2021   GFRAA 49 (L) 09/22/2019    Medications: I have reviewed the patient's current medications.   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, status post a left nephro ureterectomy 04/27/2019 Tumor extends to the muscularis, pT2, pN0 May 2023-recurrence at the left trigone area on surveillance cystoscopy-TURBT-invasive high-grade papillary urothelial carcinoma invading muscularis propria PET 07/29/2021-prior left nephro ureterectomy and TURBT, no evidence of metastatic disease Neoadjuvant gemcitabine/carboplatin for 4 cycles 07/17/2021-September 2023 PET 10/24/2021-no evidence of hypermetabolic metastatic disease, focal hypermetabolism in the sigmoid colon with associated diverticular wall thickening, hypermetabolic 1.8 cm left thyroid hyperdense nodule-unchanged  2.  Congenital left horseshoe kidney 3.  Right  breast cancer, status post right lumpectomy and sentinel lymph node biopsy 04/01/2016-pT1bpN0, status post a right lumpectomy, adjuvant radiation, and 5 years of anastrozole (started 07/15/2016)  4.  Hypermetabolic left thyroid nodule on PET 10/24/2021 5.  Diabetes 6.  Gastroesophageal reflux disease 7.  IBS 8.  History of atrial fibrillation/flutter 9.  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  She developed recurrent invasive urothelial carcinoma in the bladder and underwent a TURBT in May 2023.  This was followed by 4 cycles of gemcitabine/carboplatin.  She is now in clinical remission.  He met with Dr. Tresa Moore and decided on surveillance as opposed to proceeding with cystectomy or radiation.  Cynthia [name] is in clinical remission from urothelial carcinoma.  She should be scheduled for a surveillance cystoscopy within the next month.  She will return for an office visit and Port-A-Cath flush in 6 weeks.  We discussed treatment options if she develops recurrent disease including repeat treatment with chemotherapy and immunotherapy.    Betsy Coder, MD  02/13/2022  2:03 PM

## 2022-02-14 ENCOUNTER — Telehealth: Payer: Self-pay | Admitting: Oncology

## 2022-02-14 NOTE — Telephone Encounter (Signed)
Attempted to contact patient in regards to patient schedule. Per Dr Carlene Coria patient to return for port flush and office visit in 6 weeks. No answer so voicemail was left for patient

## 2022-02-18 DIAGNOSIS — C678 Malignant neoplasm of overlapping sites of bladder: Secondary | ICD-10-CM | POA: Diagnosis not present

## 2022-02-24 DIAGNOSIS — D6869 Other thrombophilia: Secondary | ICD-10-CM | POA: Diagnosis not present

## 2022-02-24 DIAGNOSIS — I4891 Unspecified atrial fibrillation: Secondary | ICD-10-CM | POA: Diagnosis not present

## 2022-02-24 DIAGNOSIS — K30 Functional dyspepsia: Secondary | ICD-10-CM | POA: Diagnosis not present

## 2022-02-24 DIAGNOSIS — L853 Xerosis cutis: Secondary | ICD-10-CM | POA: Diagnosis not present

## 2022-02-24 DIAGNOSIS — N1831 Chronic kidney disease, stage 3a: Secondary | ICD-10-CM | POA: Diagnosis not present

## 2022-02-24 DIAGNOSIS — F330 Major depressive disorder, recurrent, mild: Secondary | ICD-10-CM | POA: Diagnosis not present

## 2022-02-24 DIAGNOSIS — C679 Malignant neoplasm of bladder, unspecified: Secondary | ICD-10-CM | POA: Diagnosis not present

## 2022-02-26 ENCOUNTER — Telehealth: Payer: Self-pay

## 2022-02-26 NOTE — Telephone Encounter (Signed)
Alert received from [employer]:  LINQ alert received.  1 new pause event 2/17 @ 13:04, EGM show 3sec conversion pause, AF/AFL to NSR.  Route to triage Hx of PAF, Eliquis, Amiodarone  Left message for Pt requesting call back to device clinic to assess for symptoms

## 2022-02-27 ENCOUNTER — Encounter: Payer: Self-pay | Admitting: Neurology

## 2022-02-27 ENCOUNTER — Ambulatory Visit: Payer: Medicare PPO | Admitting: Neurology

## 2022-02-27 VITALS — BP 116/73 | HR 88 | Ht 65.0 in | Wt 153.0 lb

## 2022-02-27 DIAGNOSIS — R253 Fasciculation: Secondary | ICD-10-CM

## 2022-02-27 DIAGNOSIS — G2581 Restless legs syndrome: Secondary | ICD-10-CM | POA: Diagnosis not present

## 2022-02-27 NOTE — Patient Instructions (Signed)
It was nice to see you again today.  We can hold off on the sleep study as per your request.  I would be happy to order a sleep study if you are ready for it.  If you have any involuntary or uncontrollable twitching or jerking, please call 911 or have your family call 911.  I would be happy to see you back as needed.

## 2022-02-27 NOTE — Progress Notes (Signed)
Subjective:    Patient ID: Cynthia [name] is a 77 y.o. female.  HPI    Interim history:   Cynthia [name] is a 77 year old right-handed woman with a complex medical history of bladder cancer, breast cancer, kidney cancer, bradycardia, arthritis, anxiety, first-degree heart block, paroxysmal A-fib, with status post AICD placement, history of radiation therapy, hypertension, hyperlipidemia, hypothyroidism, irritable bowel syndrome, degenerative disc disease of the lumbar spine, anxiety, status post multiple surgeries including right breast lumpectomy, cholecystectomy, laparoscopic nephroureterectomy, transurethral resection of bladder tumor, and mildly overweight state, who He presents for follow-up consultation of her restless leg symptoms and involuntary twitching at night.  She is unaccompanied today.  I first met her at the request of her primary care nurse practitioner on 09/19/2021, at which time she reported a longstanding history of restless leg syndrome and difficulty sleeping at night.  She had recently started trazodone.  She is referred for a sooner than scheduled follow-up appointment by her primary care nurse practitioner due to involuntary movements at night.  When I first met her she was going through chemotherapy.  She wanted to hold off on sleep testing at the time.  Today, 02/27/2022: She reports that she had altogether maybe 3 episodes of involuntary muscle twitching which happened at night.  She had an office visit with her primary care nurse practitioner, Ms. Amedeo Plenty on 01/07/2022, at which time she reported a episode of involuntary muscle jerking which led to her to not be able to get back into bed after getting back from the bathroom.  She reports that she was conscious at the time, she was aware of the situation, she reports that she has never had a seizure.  She reports that her muscle twitching was bad enough that she could not climb onto the bed which is elevated and her husband and her son  had to help her.  She did not call 911.  She does not have any changes in her medication regimen, she does take Xanax at night and has done so for many years, she was on higher doses of Xanax in the past, she had tried Ambien in the past but came off of it eventually, she altogether took Ambien nightly for about 17 years as she recalls.  She has been on trazodone at night.  She was advised to start low-dose magnesium, 250 mg daily per primary care and has been taking it since January.  Her magnesium level at the time was 1.6. She does not wish to proceed with a sleep study quite yet, she would like to hold off as she feels that she would not be able to sleep well at all in the sleep lab even if she took her trazodone.  She tries to hydrate well with water.  She limits her caffeine.  She endorses quite a bit of stress particularly regarding her husband's health.  He is doing a little better.  He has to take seizure medication.  The patient's allergies, current medications, family history, past medical history, past social history, past surgical history and problem list were reviewed and updated as appropriate.   Previously:   09/19/21: (She) reports a longstanding history of restless leg syndrome.  I reviewed your office note from 06/17/2021.  She has recently started trazodone at night to help her sleep which is helping.  She has a longstanding history of restless leg syndrome and has to sometimes get up at night and take a warm bath or walk around.  She has not  been on any prescription medication for restless legs itself.  She had seen Dr. Maxwell Caul in sleep medicine some 8 months ago, has never had a sleep study and has not received any treatment through his office.  She has since been admitted recently.  Bedtime could be around 1:45 AM.  She does take her trazodone late and may be asleep by 2 or 2:30 AM.  She sleeps till 10:30 AM or 11:30 AM.  She has 1 more chemotherapy pending.  She had chemotherapy  yesterday.  She is not keen on starting any new medications as she is also looking at getting her bladder cancer surgery in November, she will have a urostomy after that.  She is not sure about any family history of restless leg syndrome.  She had a tonsillectomy as a child.  She is supposed to keep a blood pressure log for about 3 weeks, checking it once a day.  She has tried mirtazapine to help her sleep at night but she had significant weight gain from it.  She has been on Xanax for about 40 years and tried to come off of it but had withdrawal.  She continues to take it.  Epworth sleepiness score is 4 out of 24, fatigue severity score is 38 out of 63.  She lives with her husband, her son and daughter-in-law live with them in the townhome, she has a daughter in [place].   Of note, she had recent hospitalizations.  She was in the hospital as last week for acute respiratory failure with hypoxia.  She was hospitalized in August for acute respiratory failure with hypoxia in the context of pneumonia.  She had a hospitalization from 08/31/2021 through 09/03/2021.  She was readmitted on 09/12/2021 through 09/14/2021.  She also presented to the emergency room on 08/19/2021 with fever and dysuria.  Of note, she is followed by oncology and is currently receiving chemotherapy.  She has a single kidney.    She had blood work yesterday on 09/18/2021 which showed a low hemoglobin at 11, actually improved from September 8.  Hematocrit 34, MCV elevated at 100.6, platelets normal at 282.  CMP showed glucose of 114, BUN 17, creatinine 1.03.  GFR below normal at 56 mL/min.   Blood work from 09/14/2021 was also reviewed, magnesium 2.1, BMP with benign findings, CBC showed hemoglobin of 8.9, hematocrit 27.5, MCV 100.7.  Her Past Medical History Is Significant For: Past Medical History:  Diagnosis Date   AICD (automatic cardioverter/defibrillator) present    Medtronic   Anticoagulant long-term use    eliquis--- managed by  cardiology   Anxiety    Arthritis    Bladder cancer ([place])    Bradycardia    Breast cancer ([place])    left, s/p lumpectomy, Dr [name]   Cancer of kidney [facility])    left   DDD (degenerative disc disease), lumbar    Depression    Dyspnea    ON EXERTION   First degree heart block    Genetic testing 06/25/2016   Ms. Sayed underwent genetic counseling and testing for hereditary cancer syndromes on 06/17/2016. Her results were negative for mutations in all 46 genes analyzed by [facility]'s 46-gene Common Hereditary Cancers Panel. Genes analyzed include: APC, ATM, AXIN2, BARD1, BMPR1A, BRCA1, BRCA2, BRIP1, CDH1, CDKN2A, CHEK2, CTNNA1, DICER1, EPCAM, GREM1, HOXB13, KIT, MEN1, MLH1, MSH2, MSH3, MSH6, MUTYH, NBN,   GERD (gastroesophageal reflux disease)    occasional,  takes pepcid   Hematuria    History of radiation therapy  05/22/2016 to 06/20/2016   right breast cancer   Hyperlipidemia    Hypertension    followd by pcp---  (02-22-2019 per pt never had stress test)   Hypothyroidism    followed by pcp   IBS (irritable bowel syndrome)    Incomplete right bundle branch block    Insomnia    Malignant neoplasm of upper-inner quadrant of right breast in female, estrogen receptor positive [facility]) oncologist--- dr [name]   dx 03/ 2018,  Stage IA, IDC, ER/PR +;  04-01-2016 s/p  right breast lumpectomy w/ node dissection's;  completed radiation 06-20-2016   MVP (mitral valve prolapse)    per echo 12-11-2018 in epic, mild    NSVT (nonsustained ventricular tachycardia) ([place]) followed by cardiology   12-10-2018  hospital admission ,  refer to discharge note 12-14-2018 for treatement   PAF (paroxysmal atrial fibrillation) [facility]) primary cardiologist--- dr Margaretann Loveless   newly dx 12-10-2018  admission in epic, Afib w/ RVR and NSVT;   in epic TTE 12-11-2018 showed ef 50-55%, mild MVP,  mild AV sclerosis without stenosis;   Cardiac MRI 12-13-2018 in epic   Prediabetes    Renal mass, left    pelvis    Restless leg  syndrome    occasional   RLS (restless legs syndrome)    Type 2 diabetes mellitus ([place])    followed by pcp---  (02-22-2019 does not check blood sugar's)    Her Past Surgical History Is Significant For: Past Surgical History:  Procedure Laterality Date   BREAST LUMPECTOMY WITH RADIOACTIVE SEED AND SENTINEL LYMPH NODE BIOPSY Right 04/01/2016   Procedure: RADIOACTIVE SEED GUIDED RIGHT BREAST LUMPECTOMY WITH RIGHT AXILLARY SENTINEL LYMPH NODE BIOPSY.;  Surgeon: Fanny Skates, MD;  Location: [place];  Service: General;  Laterality: Right;   CARDIAC CATHETERIZATION  12/11/2018   CATARACT EXTRACTION W/ INTRAOCULAR LENS  IMPLANT, BILATERAL  1980s   COLONOSCOPY     CYSTOSCOPY W/ RETROGRADES Right 06/05/2021   Procedure: CYSTOSCOPY WITH RETROGRADE PYELOGRAM;  Surgeon: Alexis Frock, MD;  Location: [facility];  Service: Urology;  Laterality: Right;   CYSTOSCOPY/RETROGRADE/URETEROSCOPY N/A 03/02/2019   Procedure: CYSTOSCOPY/LEFT RETROGRADE/URETEROSCOPY/  BIOPSY/  STENT;  Surgeon: Ceasar Mons, MD;  Location: [facility];  Service: Urology;  Laterality: N/A;   INCISIONAL HERNIA REPAIR N/A 03/29/2020   Procedure: REPAIR OF INCISIONAL HERNIA WITH MESH;  Surgeon: Erroll Luna, MD;  Location: [place];  Service: General;  Laterality: N/A;   INSERTION OF ICD     medtronic   IR IMAGING GUIDED PORT INSERTION  07/18/2021   LAPAROSCOPIC CHOLECYSTECTOMY  1990s   ROBOT ASSITED LAPAROSCOPIC NEPHROURETERECTOMY Left 04/27/2019   Procedure: XI ROBOT ASSITED LAPAROSCOPIC NEPHROURETERECTOMY;  Surgeon: Alexis Frock, MD;  Location: [facility];  Service: Urology;  Laterality: Left;  3.5 HRS   TONSILLECTOMY  age 73   TRANSURETHRAL RESECTION OF BLADDER TUMOR N/A 06/05/2021   Procedure: TRANSURETHRAL RESECTION OF BLADDER TUMOR (TURBT);  Surgeon: Alexis Frock, MD;  Location: [facility];  Service: Urology;  Laterality: N/A;    Her Family History Is Significant For: Family History  Problem Relation Age of  Onset   Congestive Heart Failure Mother    Breast cancer Sister 75       d.54   Cervical cancer Sister 6   Leukemia Brother 70   Leukemia Son    Ovarian cancer Maternal Aunt 92       d.92s   Breast cancer Other 45   Restless legs syndrome Neg Hx  Sleep apnea Neg Hx     Her Social History Is Significant For: Social History   Socioeconomic History   Marital status: Married    Spouse name: Not on file   Number of children: Not on file   Years of education: Not on file   Highest education level: Not on file  Occupational History   Not on file  Tobacco Use   Smoking status: Never   Smokeless tobacco: Never  Vaping Use   Vaping Use: Never used  Substance and Sexual Activity   Alcohol use: Not Currently    Comment: 4 or 5 mixed drinks per year   Drug use: Never   Sexual activity: Not on file  Other Topics Concern   Not on file  Social History Narrative   Caffeine: max of 1-1.5 cokes per day    Right handed   Social Determinants of Health   Financial Resource Strain: Not on file  Food Insecurity: No Food Insecurity (02/13/2022)   Hunger Vital Sign    Worried About Running Out of Food in the Last Year: Never true    Ran Out of Food in the Last Year: Never true  Transportation Needs: No Transportation Needs (02/13/2022)   PRAPARE - Hydrologist (Medical): No    Lack of Transportation (Non-Medical): No  Physical Activity: Not on file  Stress: Not on file  Social Connections: Not on file    Her Allergies Are:  No Known Allergies:   Her Current Medications Are:  Outpatient Encounter Medications as of 02/27/2022  Medication Sig   acetaminophen (TYLENOL) 500 MG tablet Take 1,000 mg by mouth at bedtime.   ALPRAZolam (XANAX) 1 MG tablet Take 1 mg by mouth at bedtime. Take 1 mg by mouth at 11 PM and an additional 0.5 mg four hours later if needed for unresolved sleeplessness   amiodarone (PACERONE) 200 MG tablet Take 1 tablet (200 mg total) by  mouth daily.   apixaban (ELIQUIS) 5 MG TABS tablet TAKE 1 TABLET BY MOUTH TWICE A DAY   Ascorbic Acid (VITAMIN C) 500 MG CAPS Take 500 mg by mouth daily.   Cholecalciferol (VITAMIN D-3) 25 MCG (1000 UT) CAPS Take 2,000 Units by mouth daily with breakfast.   Choline Fenofibrate (FENOFIBRIC ACID) 135 MG CPDR Take 135 mg by mouth daily.    ferrous sulfate 325 (65 FE) MG tablet Take 325 mg by mouth at bedtime.   levothyroxine (SYNTHROID, LEVOTHROID) 112 MCG tablet Take 112 mcg by mouth daily.   MAGNESIUM PO Take 250 mg by mouth daily.   pantoprazole (PROTONIX) 40 MG tablet Take 40 mg by mouth 2 (two) times daily before a meal.   Psyllium (METAMUCIL PO) Take 5 each by mouth daily as needed.   traZODone (DESYREL) 100 MG tablet Take 1 1/2 tablet by mouth (150 mg Total) at bedtime   [DISCONTINUED] FIBER PO Take 6-7 tablets by mouth as needed (Constipation).   No facility-administered encounter medications on file as of 02/27/2022.  :  Review of Systems:  Out of a complete 14 point review of systems, all are reviewed and negative with the exception of these symptoms as listed below:  Review of Systems  Neurological:        Patient is here alone for consultation for involuntary movements. She has been seen for restless legs. She reports episodes about 3 times where she gets up and she starts jerking all over including arms. She states the last time  she was in the bedroom and she couldn't get back into the bed because she was jerking/shaking so hard. She was given low dose magnesium by primary care.     Objective:  Neurological Exam  Physical Exam Physical Examination:   Vitals:   02/27/22 1453  BP: 116/73  Pulse: 88    General Examination: The patient is a very pleasant 77 y.o. female in no acute distress. She appears well-developed and well-nourished and well groomed.   HEENT: Normocephalic, atraumatic, pupils are equal, round and reactive to light, extraocular tracking is good without  limitation to gaze excursion or nystagmus noted. Hearing is grossly intact. Face is symmetric with mild facial puffiness noted, normal facial animation. Speech is clear with no dysarthria noted. There is no hypophonia. There is no lip, neck/head, jaw or voice tremor. Neck is supple with full range of passive and active motion. There are no carotid bruits on auscultation. Oropharynx exam reveals: moderate mouth dryness, adequate dental hygiene and mild airway crowding, due to redundant soft palate, tonsils absent.   Chest: Clear to auscultation without wheezing, rhonchi or crackles noted.   Heart: S1+S2+0, regular with mild systolic murmur noted.      Abdomen: Soft, non-tender and non-distended.   Extremities: There is 1+ pitting edema in the distal lower extremities bilaterally.    Skin: Warm and dry without trophic changes noted.    Musculoskeletal: exam reveals no obvious joint deformities. Increased upper back curvature.   Neurologically:  Mental status: The patient is awake, alert and oriented in all 4 spheres. Her immediate and remote memory, attention, language skills and fund of knowledge are appropriate. There is no evidence of aphasia, agnosia, apraxia or anomia. Speech is clear with normal prosody and enunciation. Thought process is linear. Mood is normal and affect is normal.  Cranial nerves II - XII are as described above under HEENT exam.  Motor exam: Normal bulk, strength and tone is noted. There is no obvious tremor. Fine motor skills and coordination: grossly intact.  Cerebellar testing: No dysmetria or intention tremor. There is no truncal or gait ataxia.  Sensory exam: intact to light touch in the upper and lower extremities.  Gait, station and balance: She stands easily. No veering to one side is noted. No leaning to one side is noted. Posture is stooped for age and she walks with a mild limp, no walking aid.      Assessment and Plan:  In summary, Ashanna Echavarria is a very  pleasant 77 year old female with a complex medical history of bladder cancer, breast cancer, kidney cancer, bradycardia, arthritis, anxiety, first-degree heart block, paroxysmal A-fib, with status post AICD placement, history of radiation therapy, hypertension, hyperlipidemia, hypothyroidism, irritable bowel syndrome, degenerative disc disease of the lumbar spine, anxiety, status post multiple surgeries including right breast lumpectomy, cholecystectomy, laparoscopic nephroureterectomy, transurethral resection of bladder tumor, and mildly overweight state, who presents for follow-up consultation of her restless leg syndrome and involuntary twitching at night.  She reports that she had 3 episodes within the past 6 months of involuntary muscle twitching at night, mostly self-limited, no associated altered level of alertness or consciousness, no recent medication changes, does report not sleeping well and having more stress.  She has started magnesium as of early January 2024 after she saw her primary care nurse practitioner.  We had talked about pursuing a sleep study in September 2023 when we first met but she wanted to hold off.  She is advised to consider a sleep study  at this time, a laboratory attended sleep study is recommended so we can look at involuntary muscle movements and leg movements.  She would like to hold off, she feels that she has done fairly well since she saw her primary care, she reports taking magnesium and would like to see if it helps.  She is reminded to stay well-hydrated and well rested and we talked about the importance of stress management.  If she changes her mind she can call us and we can order a sleep study.  She is advised to get immediate medical attention if she has prolonged involuntary movements, changes in alertness level and more widespread shaking or twitching, history is not suggestive of seizures.  She is advised to let her family know and have them call 911 if she has a  more alarming or repeated presentation of her involuntary twitching.  I answered all her questions today and she was in agreement.  She is advised to follow-up with her primary care provider at this time. I spent 40 minutes in total face-to-face time and in reviewing records during pre-charting, more than 50% of which was spent in counseling and coordination of care, reviewing test results, reviewing medications and treatment regimen and/or in discussing or reviewing the diagnosis of muscle twitching, restless legs syndrome, the prognosis and treatment options. Pertinent laboratory and imaging test results that were available during this visit with the patient were reviewed by me and considered in my medical decision making (see chart for details).

## 2022-02-28 ENCOUNTER — Telehealth: Payer: Self-pay | Admitting: Neurology

## 2022-02-28 DIAGNOSIS — R253 Fasciculation: Secondary | ICD-10-CM

## 2022-02-28 DIAGNOSIS — G2581 Restless legs syndrome: Secondary | ICD-10-CM

## 2022-02-28 NOTE — Telephone Encounter (Signed)
Spoke with patient regarding pause episode patient denies any syncope or dizzy spells. Patient appreciative of call back

## 2022-02-28 NOTE — Telephone Encounter (Signed)
Pt has decided to would like to have the sleep study. Pt said have trouble sleeping, but would still want to do the sleep study.

## 2022-03-03 NOTE — Telephone Encounter (Signed)
Spoke with Dr Rexene Alberts. Verbal order placed for [facility]PS[facility].

## 2022-03-10 ENCOUNTER — Ambulatory Visit (INDEPENDENT_AMBULATORY_CARE_PROVIDER_SITE_OTHER): Payer: Medicare PPO

## 2022-03-10 DIAGNOSIS — R55 Syncope and collapse: Secondary | ICD-10-CM

## 2022-03-11 LAB — CUP PACEART REMOTE DEVICE CHECK
Date Time Interrogation Session: 20240229231859
Implantable Pulse Generator Implant Date: 20210521

## 2022-03-14 ENCOUNTER — Telehealth: Payer: Self-pay | Admitting: Neurology

## 2022-03-14 NOTE — Telephone Encounter (Signed)
[employer]- [employer] pending uploaded notes on the portal.

## 2022-03-17 NOTE — Telephone Encounter (Signed)
Checked status on the portal it is still pending.  

## 2022-03-20 ENCOUNTER — Ambulatory Visit: Payer: Medicare PPO | Admitting: Neurology

## 2022-03-20 NOTE — Progress Notes (Signed)
[employer] Summary Report / Loop Recorder 

## 2022-03-23 ENCOUNTER — Other Ambulatory Visit: Payer: Self-pay | Admitting: Internal Medicine

## 2022-03-23 DIAGNOSIS — I4891 Unspecified atrial fibrillation: Secondary | ICD-10-CM

## 2022-03-24 NOTE — Telephone Encounter (Signed)
Pt last saw Mamie Levers, NP on 11/22/21, last labs 02/13/22 Creat 1.03, age 77, weight 69.4kg, based on specified criteria pt is on Eliquis 5mg  BID for afib.  Will refill rx.

## 2022-03-26 DIAGNOSIS — L84 Corns and callosities: Secondary | ICD-10-CM | POA: Diagnosis not present

## 2022-03-26 DIAGNOSIS — L603 Nail dystrophy: Secondary | ICD-10-CM | POA: Diagnosis not present

## 2022-03-26 DIAGNOSIS — M79671 Pain in right foot: Secondary | ICD-10-CM | POA: Diagnosis not present

## 2022-03-26 DIAGNOSIS — E1140 Type 2 diabetes mellitus with diabetic neuropathy, unspecified: Secondary | ICD-10-CM | POA: Diagnosis not present

## 2022-03-27 ENCOUNTER — Ambulatory Visit: Payer: Medicare PPO | Admitting: Oncology

## 2022-04-08 ENCOUNTER — Inpatient Hospital Stay: Payer: Medicare PPO | Admitting: Oncology

## 2022-04-08 ENCOUNTER — Other Ambulatory Visit: Payer: Self-pay

## 2022-04-08 ENCOUNTER — Inpatient Hospital Stay: Payer: Medicare PPO | Attending: Oncology

## 2022-04-08 VITALS — BP 136/77 | HR 93 | Temp 98.1°F | Resp 18 | Ht 65.0 in | Wt 153.0 lb

## 2022-04-08 DIAGNOSIS — C679 Malignant neoplasm of bladder, unspecified: Secondary | ICD-10-CM | POA: Insufficient documentation

## 2022-04-08 DIAGNOSIS — C689 Malignant neoplasm of urinary organ, unspecified: Secondary | ICD-10-CM | POA: Diagnosis not present

## 2022-04-08 DIAGNOSIS — Z452 Encounter for adjustment and management of vascular access device: Secondary | ICD-10-CM | POA: Insufficient documentation

## 2022-04-08 DIAGNOSIS — Z95828 Presence of other vascular implants and grafts: Secondary | ICD-10-CM

## 2022-04-08 MED ORDER — HEPARIN SOD (PORK) LOCK FLUSH 100 UNIT/ML IV SOLN
500.0000 [IU] | Freq: Once | INTRAVENOUS | Status: AC
  Administered 2022-04-08: 500 [IU] via INTRAVENOUS

## 2022-04-08 MED ORDER — SODIUM CHLORIDE 0.9% FLUSH
10.0000 mL | Freq: Once | INTRAVENOUS | Status: AC
  Administered 2022-04-08: 10 mL via INTRAVENOUS

## 2022-04-08 NOTE — Progress Notes (Signed)
  [facility] OFFICE PROGRESS NOTE   Diagnosis: Urothelial carcinoma  INTERVAL HISTORY:   Ms. Knieriem returns as scheduled.  She reports a cystoscopy by Dr. Tresa Moore in February was negative.  She is scheduled for another cystoscopy next month.  No hematuria.  Good appetite.  She has a chronic nonproductive cough.  She is followed by Dr. Paulita Fujita for reflux.  Objective:  Vital signs in last 24 hours:  Blood pressure 136/77, pulse 93, temperature 98.1 F (36.7 C), temperature source Oral, resp. rate 18, height 5\' 5"  (1.651 m), weight 153 lb (69.4 kg), SpO2 98 %.    HEENT: Neck without mass, no palpable thyroid nodule Lymphatics: No cervical, supraclavicular, axillary, or inguinal nodes Resp: Lungs clear bilaterally Cardio: Irregular GI: No hepatosplenomegaly, reducible ventral hernia, no mass Vascular: No leg edema   Lab Results:  Lab Results  Component Value Date   WBC 3.8 (L) 02/13/2022   HGB 11.4 (L) 02/13/2022   HCT 35.6 (L) 02/13/2022   MCV 90.4 02/13/2022   PLT 223 02/13/2022   NEUTROABS 1.9 02/13/2022    CMP  Lab Results  Component Value Date   NA 142 02/13/2022   K 4.1 02/13/2022   CL 104 02/13/2022   CO2 30 02/13/2022   GLUCOSE 85 02/13/2022   BUN 21 02/13/2022   CREATININE 1.03 (H) 02/13/2022   CALCIUM 9.4 02/13/2022   PROT 6.6 02/13/2022   ALBUMIN 3.8 02/13/2022   AST 17 02/13/2022   ALT 10 02/13/2022   ALKPHOS 35 (L) 02/13/2022   BILITOT 0.5 02/13/2022   GFRNONAA 55 (L) 02/13/2022   GFRAA 49 (L) 09/22/2019    Medications: I have reviewed the patient's current medications.   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, status post a left nephro ureterectomy 04/27/2019 Tumor extends to the muscularis, pT2, pN0 May 2023-recurrence at the left trigone area on surveillance cystoscopy-TURBT-invasive high-grade papillary urothelial carcinoma invading muscularis propria PET 07/29/2021-prior left nephro  ureterectomy and TURBT, no evidence of metastatic disease Neoadjuvant gemcitabine/carboplatin for 4 cycles 07/17/2021-September 2023 PET 10/24/2021-no evidence of hypermetabolic metastatic disease, focal hypermetabolism in the sigmoid colon with associated diverticular wall thickening, hypermetabolic 1.8 cm left thyroid hyperdense nodule-unchanged  2.  Congenital left horseshoe kidney 3.  Right breast cancer, status post right lumpectomy and sentinel lymph node biopsy 04/01/2016-pT1bpN0, status post a right lumpectomy, adjuvant radiation, and 5 years of anastrozole (started 07/15/2016)  4.  Hypermetabolic left thyroid nodule on PET 10/24/2021 5.  Diabetes 6.  Gastroesophageal reflux disease 7.  IBS 8.  History of atrial fibrillation/flutter 9.  History of NSVT     Disposition: Ms. Nakada is in clinical remission from urothelial carcinoma.  She continues cystoscopy surveillance with Dr. Tresa Moore.  We will consider surveillance imaging in the fall 2024.  She will return for a Port-A-Cath flush in 6 weeks and an office visit in 12 weeks.  Ms. Coste was noted to have a hypermetabolic left thyroid nodule on PET imaging last year.  I will refer her for a thyroid ultrasound.  Betsy Coder, MD  04/08/2022  2:54 PM

## 2022-04-09 LAB — CUP PACEART REMOTE DEVICE CHECK
Date Time Interrogation Session: 20240403002233
Implantable Pulse Generator Implant Date: 20210521

## 2022-04-10 ENCOUNTER — Other Ambulatory Visit: Payer: Self-pay

## 2022-04-10 ENCOUNTER — Ambulatory Visit (INDEPENDENT_AMBULATORY_CARE_PROVIDER_SITE_OTHER): Payer: Medicare PPO

## 2022-04-10 DIAGNOSIS — R55 Syncope and collapse: Secondary | ICD-10-CM

## 2022-04-10 DIAGNOSIS — K219 Gastro-esophageal reflux disease without esophagitis: Secondary | ICD-10-CM | POA: Diagnosis not present

## 2022-04-10 DIAGNOSIS — Z1211 Encounter for screening for malignant neoplasm of colon: Secondary | ICD-10-CM | POA: Diagnosis not present

## 2022-04-18 NOTE — Progress Notes (Signed)
[employer] Summary Report / Loop Recorder 

## 2022-04-22 ENCOUNTER — Telehealth: Payer: Self-pay | Admitting: Oncology

## 2022-04-22 NOTE — Telephone Encounter (Signed)
Patient call to reschedule flush appointment  call was returned on answer left message for patient to call back

## 2022-04-23 ENCOUNTER — Ambulatory Visit (HOSPITAL_BASED_OUTPATIENT_CLINIC_OR_DEPARTMENT_OTHER)
Admission: RE | Admit: 2022-04-23 | Discharge: 2022-04-23 | Disposition: A | Discharge status: Home / Self Care | Payer: Medicare PPO | Source: Ambulatory Visit | Attending: Oncology | Admitting: Oncology

## 2022-04-23 DIAGNOSIS — C689 Malignant neoplasm of urinary organ, unspecified: Secondary | ICD-10-CM | POA: Insufficient documentation

## 2022-04-23 DIAGNOSIS — E041 Nontoxic single thyroid nodule: Secondary | ICD-10-CM | POA: Diagnosis not present

## 2022-04-28 DIAGNOSIS — L84 Corns and callosities: Secondary | ICD-10-CM | POA: Diagnosis not present

## 2022-04-28 DIAGNOSIS — E1140 Type 2 diabetes mellitus with diabetic neuropathy, unspecified: Secondary | ICD-10-CM | POA: Diagnosis not present

## 2022-04-28 DIAGNOSIS — M19071 Primary osteoarthritis, right ankle and foot: Secondary | ICD-10-CM | POA: Diagnosis not present

## 2022-04-28 NOTE — Telephone Encounter (Signed)
4/22:lvm-mla 03/18/22 Ethlyn Gallery: [phone] (exp. 03/14/22 to 06/12/22)

## 2022-04-29 ENCOUNTER — Telehealth: Payer: Self-pay | Admitting: *Deleted

## 2022-04-29 DIAGNOSIS — E079 Disorder of thyroid, unspecified: Secondary | ICD-10-CM

## 2022-04-29 DIAGNOSIS — C689 Malignant neoplasm of urinary organ, unspecified: Secondary | ICD-10-CM

## 2022-04-29 NOTE — Telephone Encounter (Signed)
Notified that thyroid US shows solid left thyroid nodule and biopsy is recommended. She agrees. Order placed. Informed by radiology scheduler that thyroid biopsies are done at [facility]. Called GI and spoke with [place] scheduler and she will reach out to patient.

## 2022-04-29 NOTE — Telephone Encounter (Signed)
-----   Message from Ladene Artist, MD sent at 04/28/2022  8:37 PM EDT ----- Please call patient, the thyroid ultrasound confirms a solid left thyroid nodule, a biopsy is recommended, please order ultrasound biopsy of left thyroid nodule, follow-up as scheduled

## 2022-04-30 NOTE — Telephone Encounter (Signed)
Noted, thank you

## 2022-05-02 ENCOUNTER — Telehealth: Payer: Self-pay

## 2022-05-02 NOTE — Telephone Encounter (Signed)
Pt called stating that she saw on mychart that she needed a biopsy but no on called her. Informed pt that the biopsy order was placed on 04/29/22 and that their scheduling department would reach out to make her appointment. Pt verbalizes understanding and agrees with plan of care.

## 2022-05-12 LAB — CUP PACEART REMOTE DEVICE CHECK
Date Time Interrogation Session: 20240506002148
Implantable Pulse Generator Implant Date: 20210521

## 2022-05-13 ENCOUNTER — Ambulatory Visit (INDEPENDENT_AMBULATORY_CARE_PROVIDER_SITE_OTHER): Payer: Medicare PPO

## 2022-05-13 DIAGNOSIS — R55 Syncope and collapse: Secondary | ICD-10-CM | POA: Diagnosis not present

## 2022-05-14 ENCOUNTER — Telehealth: Payer: Self-pay | Admitting: *Deleted

## 2022-05-14 NOTE — Telephone Encounter (Signed)
Spoke w/[facility] re: thyroid biopsy. Was informed they have reached out to her x 3 without success. Will make another attempt later in week.  Left VM for patient with above information.

## 2022-05-14 NOTE — Progress Notes (Signed)
[employer] Summary Report / Loop Recorder 

## 2022-05-15 ENCOUNTER — Telehealth: Payer: Self-pay | Admitting: Oncology

## 2022-05-16 ENCOUNTER — Inpatient Hospital Stay: Payer: Medicare PPO

## 2022-05-27 ENCOUNTER — Encounter: Payer: Medicare PPO | Admitting: Internal Medicine

## 2022-05-27 DIAGNOSIS — C678 Malignant neoplasm of overlapping sites of bladder: Secondary | ICD-10-CM | POA: Diagnosis not present

## 2022-05-31 DIAGNOSIS — N390 Urinary tract infection, site not specified: Secondary | ICD-10-CM | POA: Diagnosis not present

## 2022-06-04 NOTE — Progress Notes (Signed)
[employer] Summary Report / Loop Recorder 

## 2022-06-12 ENCOUNTER — Other Ambulatory Visit (HOSPITAL_COMMUNITY)
Admission: RE | Admit: 2022-06-12 | Discharge: 2022-06-12 | Disposition: A | Discharge status: Home / Self Care | Payer: Medicare PPO | Source: Ambulatory Visit | Attending: Oncology | Admitting: Oncology

## 2022-06-12 ENCOUNTER — Ambulatory Visit
Admission: RE | Admit: 2022-06-12 | Discharge: 2022-06-12 | Disposition: A | Discharge status: Home / Self Care | Payer: Medicare PPO | Source: Ambulatory Visit | Attending: Oncology | Admitting: Oncology

## 2022-06-12 DIAGNOSIS — E079 Disorder of thyroid, unspecified: Secondary | ICD-10-CM | POA: Diagnosis not present

## 2022-06-12 DIAGNOSIS — C73 Malignant neoplasm of thyroid gland: Secondary | ICD-10-CM | POA: Diagnosis not present

## 2022-06-12 DIAGNOSIS — E041 Nontoxic single thyroid nodule: Secondary | ICD-10-CM | POA: Diagnosis not present

## 2022-06-12 DIAGNOSIS — C689 Malignant neoplasm of urinary organ, unspecified: Secondary | ICD-10-CM

## 2022-06-12 NOTE — Procedures (Signed)
Successful US guided FNA of left inferior thyroid nodule No complications. See PACS for full report.    Alex Gardener, AGNP-BC 06/12/2022, 3:47 PM

## 2022-06-16 ENCOUNTER — Ambulatory Visit (INDEPENDENT_AMBULATORY_CARE_PROVIDER_SITE_OTHER): Payer: Medicare PPO

## 2022-06-16 DIAGNOSIS — R55 Syncope and collapse: Secondary | ICD-10-CM | POA: Diagnosis not present

## 2022-06-16 LAB — CUP PACEART REMOTE DEVICE CHECK
Date Time Interrogation Session: 20240609230137
Implantable Pulse Generator Implant Date: 20210521

## 2022-06-16 LAB — CYTOLOGY - NON PAP

## 2022-06-17 IMAGING — DX DG CHEST 2V
2 series · 2 of 2 positions shown · non-contrast
Comparison: 12/10/2018

CLINICAL DATA: 75-year-old female with a history of left renal
pelvic neoplasm

EXAM:
CHEST - 2 VIEW

[chest pa]
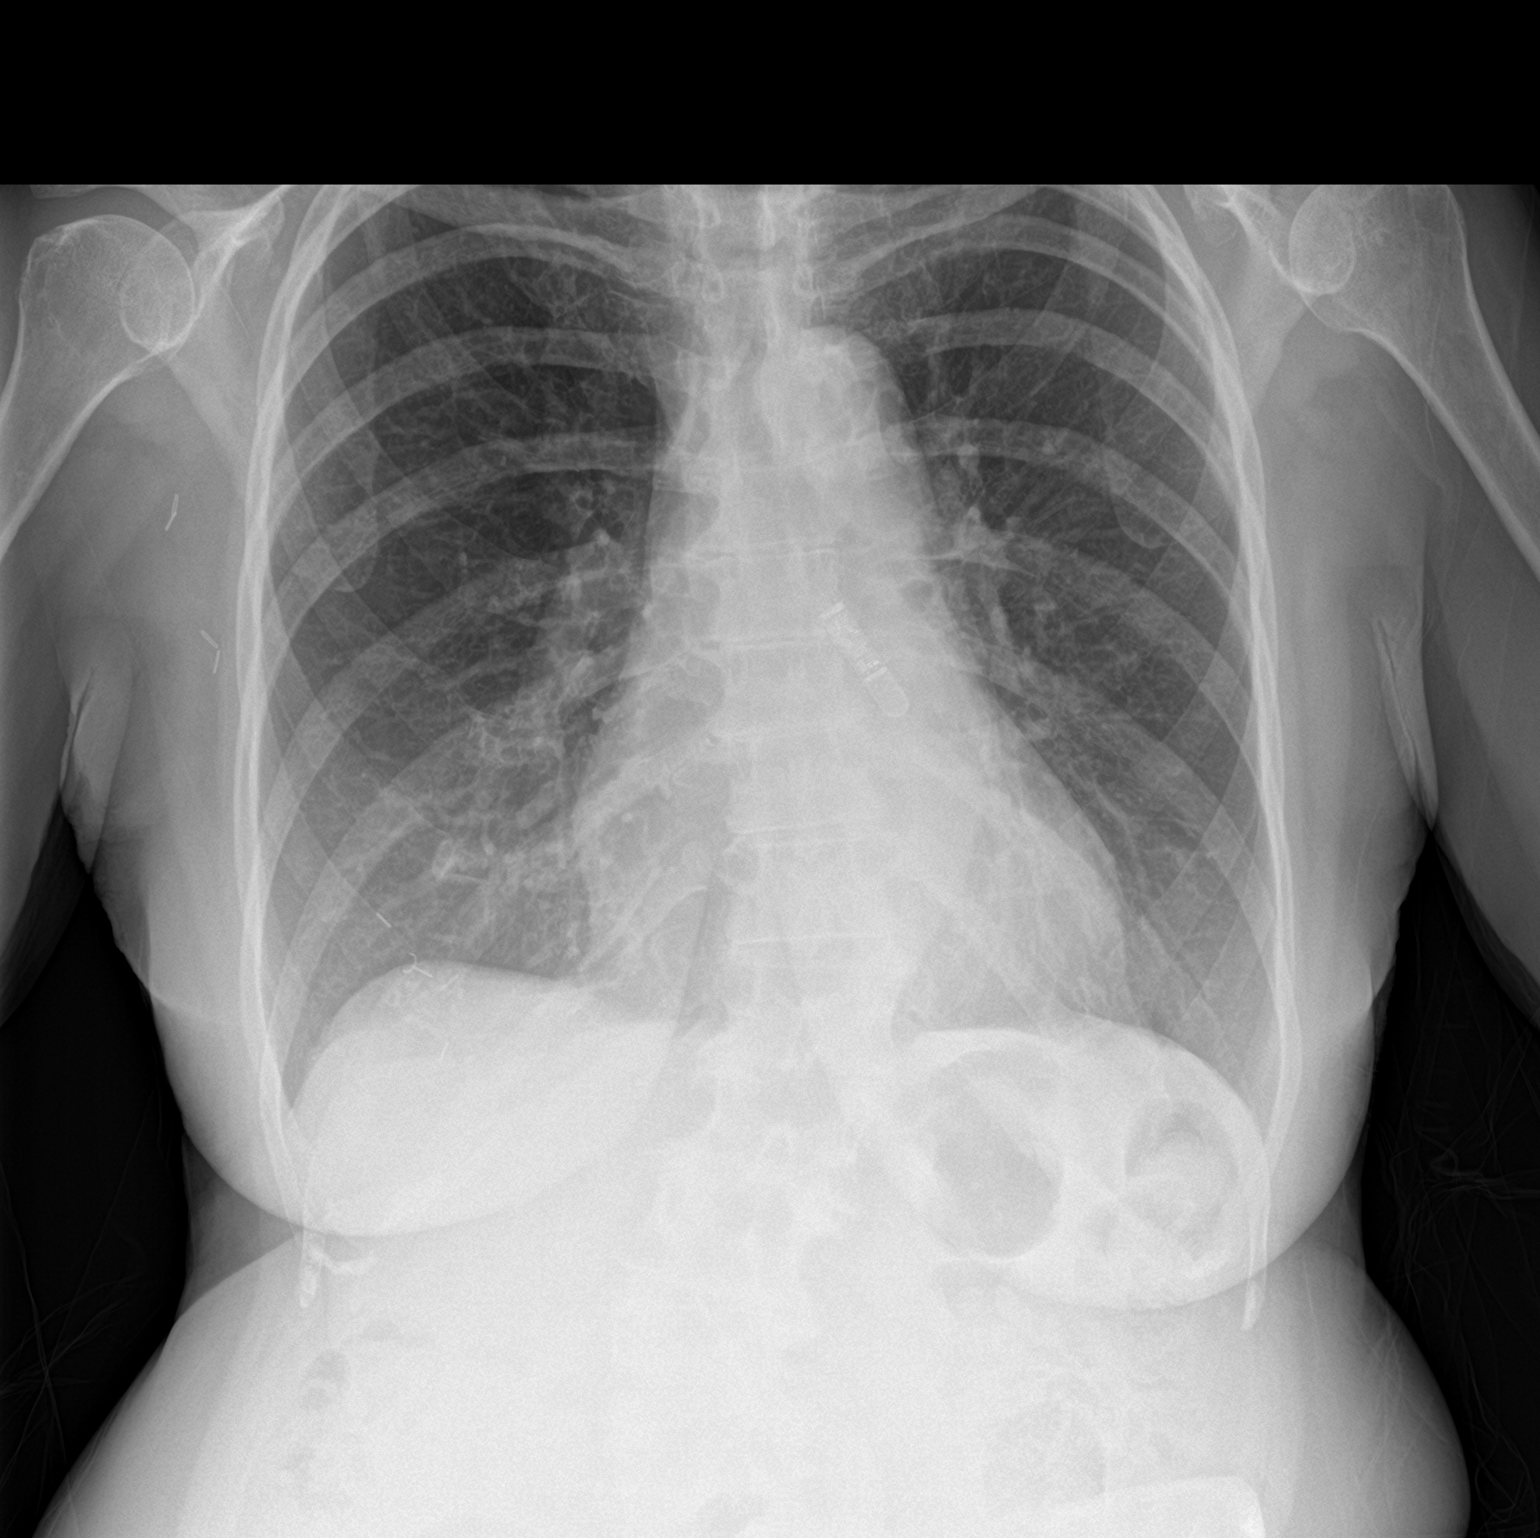

[chest lat]
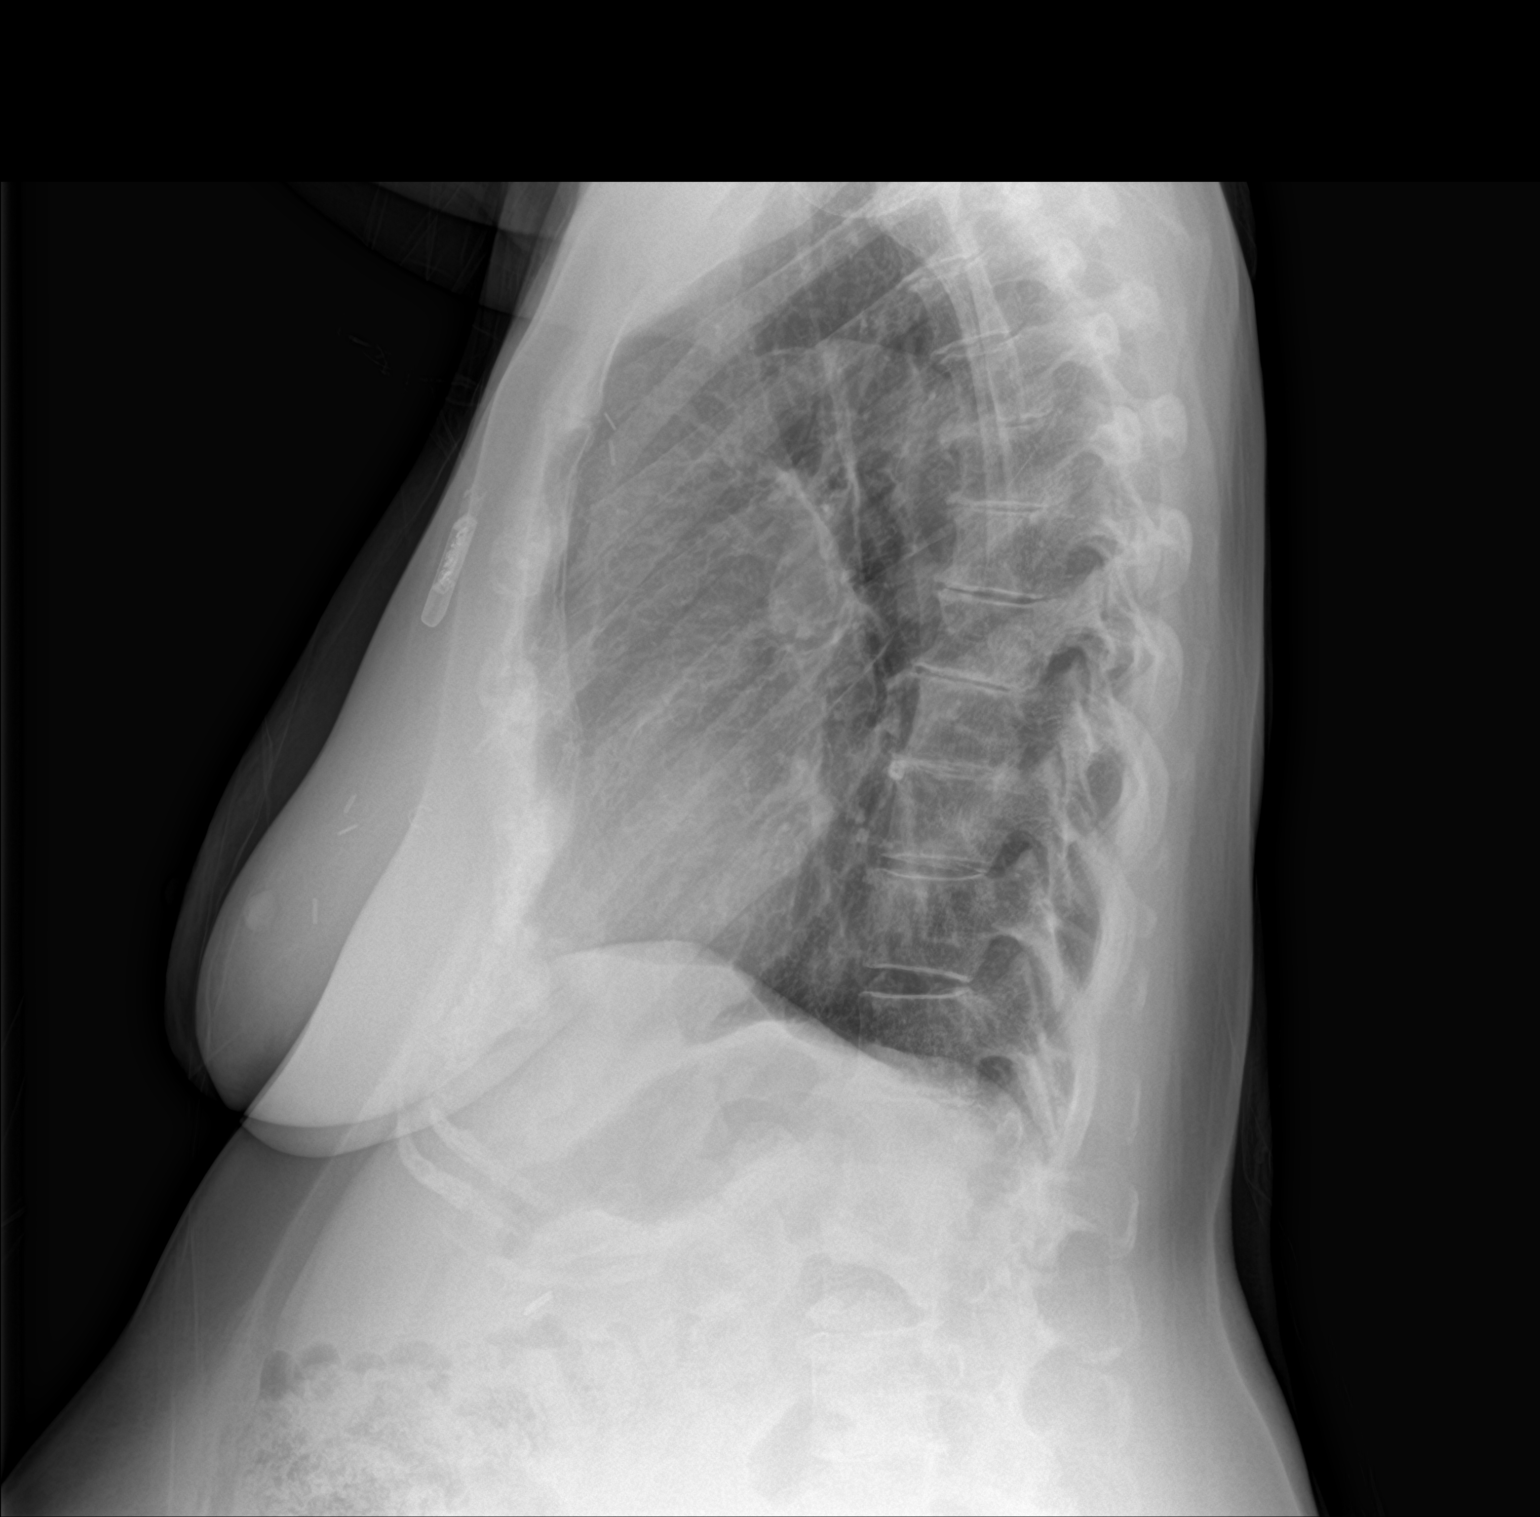

[2 of 2 positions shown; findings below may reference images not displayed]

FINDINGS: Cardiomediastinal silhouette unchanged in size and contour. No
evidence of central vascular congestion. No interlobular septal
thickening.

Interval placement of cardiac event recorder

No pneumothorax or pleural effusion. Coarsened interstitial
markings, with no confluent airspace disease.

No acute displaced fracture. Degenerative changes of the spine.

Surgical changes of the right chest wall
IMPRESSION: Negative for acute cardiopulmonary disease.

Interval placement of cardiac event recorder

## 2022-06-19 ENCOUNTER — Ambulatory Visit: Payer: Medicare PPO | Attending: Internal Medicine | Admitting: Internal Medicine

## 2022-06-19 ENCOUNTER — Encounter: Payer: Self-pay | Admitting: Internal Medicine

## 2022-06-19 VITALS — BP 114/70 | HR 87 | Ht 65.5 in | Wt 153.0 lb

## 2022-06-19 DIAGNOSIS — I4729 Other ventricular tachycardia: Secondary | ICD-10-CM | POA: Diagnosis not present

## 2022-06-19 DIAGNOSIS — E039 Hypothyroidism, unspecified: Secondary | ICD-10-CM

## 2022-06-19 DIAGNOSIS — Z79899 Other long term (current) drug therapy: Secondary | ICD-10-CM | POA: Diagnosis not present

## 2022-06-19 DIAGNOSIS — R011 Cardiac murmur, unspecified: Secondary | ICD-10-CM | POA: Diagnosis not present

## 2022-06-19 DIAGNOSIS — I480 Paroxysmal atrial fibrillation: Secondary | ICD-10-CM

## 2022-06-19 NOTE — Progress Notes (Signed)
Patient Care Team: Camie Patience, FNP as PCP - General (Family Medicine) Parke Poisson, MD as PCP - Cardiology (Cardiology) Duke Salvia, MD as PCP - Electrophysiology (Cardiology) Claud Kelp, MD as Consulting Physician (General Surgery) Dorothy Puffer, MD as Consulting Physician (Radiation Oncology) Axel Filler, Larna Daughters, NP as Nurse Practitioner (Hematology and Oncology) Berneice Heinrich Delbert Phenix., MD as Consulting Physician (Urology) Sheran Luz, MD as Consulting Physician (Physical Medicine and Rehabilitation) Elenora Fender, MD as Attending Physician (Radiology) Ladene Artist, MD as Consulting Physician (Oncology)   HPI  Cynthia [name] is a 77 y.o. female Seen in followup for syncope w hx of WCT and known afib rx w Amio; on anticoagulation with on Apixoban; status post loop recorder insertion   She has a history of near syncope in the context of wide-complex tachycardia.     Cardiac MRI suggested left ventricular noncompaction  Some orthostatic hypotension and recurrent syncope 5/21  Patient denies symptoms of respiratory, GI intolerance, sun sensitivity, neurological symptoms attributable to amiodarone.     The patient denies chest pain,   or peripheral edema.  There have been no palpitations, lightheadedness or syncope.  She complains of sluggishness which is variable.     DATE TEST EF   12/20 Echo   50-55 %   12/20 cMRI  *52* %   2/24 Echo  60-65%    Date Cr K Hgb PLT  TSH LFTs  12/20      2.85 21  5/21 1.11 3.7 11.9<<13.1       6/21 1.17 3.8   0.654   3/22 1.00 3.7 12.8 391   18  2/24 1.03 4.4 11.4     10     Records and Results Reviewed   Past Medical History:  Diagnosis Date   AICD (automatic cardioverter/defibrillator) present    Medtronic   Anticoagulant long-term use    eliquis--- managed by cardiology   Anxiety    Arthritis    Bladder cancer (HCC)    Bradycardia    Breast cancer (HCC)    left, s/p lumpectomy, Dr  Darnelle Catalan   Cancer of kidney [facility])    left   DDD (degenerative disc disease), lumbar    Depression    Dyspnea    ON EXERTION   First degree heart block    Genetic testing 06/25/2016   Ms. Hermans underwent genetic counseling and testing for hereditary cancer syndromes on 06/17/2016. Her results were negative for mutations in all 46 genes analyzed by [facility]'s 46-gene Common Hereditary Cancers Panel. Genes analyzed include: APC, ATM, AXIN2, BARD1, BMPR1A, BRCA1, BRCA2, BRIP1, CDH1, CDKN2A, CHEK2, CTNNA1, DICER1, EPCAM, GREM1, HOXB13, KIT, MEN1, MLH1, MSH2, MSH3, MSH6, MUTYH, NBN,   GERD (gastroesophageal reflux disease)    occasional,  takes pepcid   Hematuria    History of radiation therapy 05/22/2016 to 06/20/2016   right breast cancer   Hyperlipidemia    Hypertension    followd by pcp---  (02-22-2019 per pt never had stress test)   Hypothyroidism    followed by pcp   IBS (irritable bowel syndrome)    Incomplete right bundle branch block    Insomnia    Malignant neoplasm of upper-inner quadrant of right breast in female, estrogen receptor positive [facility]) oncologist--- dr Darnelle Catalan   dx 03/ 2018,  Stage IA, IDC, ER/PR +;  04-01-2016 s/p  right breast lumpectomy w/ node dissection's;  completed radiation 06-20-2016   MVP (mitral valve prolapse)  per echo 12-11-2018 in epic, mild    NSVT (nonsustained ventricular tachycardia) (HCC) followed by cardiology   12-10-2018  hospital admission ,  refer to discharge note 12-14-2018 for treatement   PAF (paroxysmal atrial fibrillation) [facility]) primary cardiologist--- dr Jacques Navy   newly dx 12-10-2018  admission in epic, Afib w/ RVR and NSVT;   in epic TTE 12-11-2018 showed ef 50-55%, mild MVP,  mild AV sclerosis without stenosis;   Cardiac MRI 12-13-2018 in epic   Prediabetes    Renal mass, left    pelvis    Restless leg syndrome    occasional   RLS (restless legs syndrome)    Type 2 diabetes mellitus (HCC)    followed by pcp---  (02-22-2019 does  not check blood sugar's)    Past Surgical History:  Procedure Laterality Date   BREAST LUMPECTOMY WITH RADIOACTIVE SEED AND SENTINEL LYMPH NODE BIOPSY Right 04/01/2016   Procedure: RADIOACTIVE SEED GUIDED RIGHT BREAST LUMPECTOMY WITH RIGHT AXILLARY SENTINEL LYMPH NODE BIOPSY.;  Surgeon: Claud Kelp, MD;  Location: [facility] OR;  Service: General;  Laterality: Right;   CARDIAC CATHETERIZATION  12/11/2018   CATARACT EXTRACTION W/ INTRAOCULAR LENS  IMPLANT, BILATERAL  1980s   COLONOSCOPY     CYSTOSCOPY W/ RETROGRADES Right 06/05/2021   Procedure: CYSTOSCOPY WITH RETROGRADE PYELOGRAM;  Surgeon: Sebastian Ache, MD;  Location: [facility];  Service: Urology;  Laterality: Right;   CYSTOSCOPY/RETROGRADE/URETEROSCOPY N/A 03/02/2019   Procedure: CYSTOSCOPY/LEFT RETROGRADE/URETEROSCOPY/  BIOPSY/  STENT;  Surgeon: Rene Paci, MD;  Location: [facility];  Service: Urology;  Laterality: N/A;   INCISIONAL HERNIA REPAIR N/A 03/29/2020   Procedure: REPAIR OF INCISIONAL HERNIA WITH MESH;  Surgeon: Harriette Bouillon, MD;  Location: [facility] OR;  Service: General;  Laterality: N/A;   INSERTION OF ICD     medtronic   IR IMAGING GUIDED PORT INSERTION  07/18/2021   LAPAROSCOPIC CHOLECYSTECTOMY  1990s   ROBOT ASSITED LAPAROSCOPIC NEPHROURETERECTOMY Left 04/27/2019   Procedure: XI ROBOT ASSITED LAPAROSCOPIC NEPHROURETERECTOMY;  Surgeon: Sebastian Ache, MD;  Location: [facility];  Service: Urology;  Laterality: Left;  3.5 HRS   TONSILLECTOMY  age 73   TRANSURETHRAL RESECTION OF BLADDER TUMOR N/A 06/05/2021   Procedure: TRANSURETHRAL RESECTION OF BLADDER TUMOR (TURBT);  Surgeon: Sebastian Ache, MD;  Location: [facility];  Service: Urology;  Laterality: N/A;    Current Meds  Medication Sig   acetaminophen (TYLENOL) 500 MG tablet Take 1,000 mg by mouth at bedtime.   ALPRAZolam (XANAX) 0.5 MG tablet Take 0.5 mg by mouth 3 (three) times daily as needed.   amiodarone (PACERONE) 200 MG tablet Take 1 tablet (200 mg  total) by mouth daily.   Cholecalciferol (VITAMIN D-3) 25 MCG (1000 UT) CAPS Take 2,000 Units by mouth daily with breakfast.   Choline Fenofibrate (FENOFIBRIC ACID) 135 MG CPDR Take 135 mg by mouth daily.    ELIQUIS 5 MG TABS tablet TAKE 1 TABLET BY MOUTH TWICE A DAY   ferrous sulfate 325 (65 FE) MG tablet Take 325 mg by mouth at bedtime.   gabapentin (NEURONTIN) 100 MG capsule Take 100 mg by mouth at bedtime.   levothyroxine (SYNTHROID, LEVOTHROID) 112 MCG tablet Take 112 mcg by mouth daily.   MAGNESIUM PO Take 250 mg by mouth daily.   omeprazole (PRILOSEC) 40 MG capsule Take 40 mg by mouth daily.   Psyllium (METAMUCIL PO) Take 5 each by mouth daily as needed.   traZODone (DESYREL) 100 MG tablet Take 1 1/2 tablet by mouth (150  mg Total) at bedtime   [DISCONTINUED] ALPRAZolam (XANAX) 1 MG tablet Take 1 mg by mouth at bedtime. Take 1 mg by mouth at 11 PM and an additional 0.5 mg four hours later if needed for unresolved sleeplessness    No Known Allergies    Review of Systems negative except from HPI and PMH  Physical Exam BP 114/70   Pulse 87   Ht 5' 5.5" (1.664 m)   Wt 153 lb (69.4 kg)   SpO2 99%   BMI 25.07 kg/m  Well developed and nourished in no acute distress HENT normal Neck supple with JVP-  flat  Clear Regular rate and rhythm, no murmurs or gallops Abd-soft with active BS No Clubbing cyanosis edema Skin-warm and dry A & Oriented  Grossly normal sensory and motor function  ECG    Event recorder was not reviewed.  October 2023 is associated with a significant change in heart rate histograms as well as heart rate variability.  The mean heart rate went from 50s to 90s.  The A-fib burden is described as only 14% but I wonder whether it is under sensed.  There were posttermination pauses noted of about 4 seconds.   Assessment and  Plan Atrial fibrillation with a rapid rate   Presyncope/syncope -probably orthostatic   Atrial fibrillation pauses/posttermination  pauses  Sinus node slowing   Wide-complex tachycardia-regular >> fasicular VT   Hypertension  Dysgraphia  Psychosocial stress-husband with brain cancer  No syncope.   Sluggishness could be related to tachycardia or bradycardia.  We discussed given the tachy bradycardia that a pacemaker would be reasonable.  The right thing at this point.  Also offered the idea of adjunctive rate controlling medication to the amiodarone and in fact considering discontinuing the amiodarone altogether but not quite sure about the atrial fibrillation burden because of the sensing issues as noted above.  Right now we have decided to do is to, noting that her sinus rates typically run slower to assume that rates in the 60s and 70s or sinus and rates over 80 or A-fib and try to use this as a correlation with her symptoms as to what is the more problematic.  Will plan to review these in about a month  Looking through the [facility] I am unable to identify medication changes that occurred 10-11/23 that would   be responsible.  It makes me wonder if this is largely been reversion to atrial fibrillation that is now persistent

## 2022-06-19 NOTE — Patient Instructions (Addendum)
Medication Instructions:  Your physician recommends that you continue on your current medications as directed. Please refer to the Current Medication list given to you today.  *If you need a refill on your cardiac medications before your next appointment, please call your pharmacy*   Lab Work: TSH and Liver Panel - please call [phone] to schedule lab appointment  If you have labs (blood work) drawn today and your tests are completely normal, you will receive your results only by: MyChart Message (if you have MyChart) OR A paper copy in the mail If you have any lab test that is abnormal or we need to change your treatment, we will call you to review the results.   Testing/Procedures: None ordered.    Follow-Up: At [facility], you and your health needs are our priority.  As part of our continuing mission to provide you with exceptional heart care, we have created designated Provider Care Teams.  These Care Teams include your primary Cardiologist (physician) and Advanced Practice Providers (APPs -  Physician Assistants and Nurse Practitioners) who all work together to provide you with the care you need, when you need it.  We recommend signing up for the patient portal called "MyChart".  Sign up information is provided on this After Visit Summary.  MyChart is used to connect with patients for Virtual Visits (Telemedicine).  Patients are able to view lab/test results, encounter notes, upcoming appointments, etc.  Non-urgent messages can be sent to your provider as well.   To learn more about what you can do with MyChart, go to [email or web address].    Your next appointment:   1 month with Dr Graciela Husbands - will have his scheduler call you with this appointment.

## 2022-06-21 ENCOUNTER — Other Ambulatory Visit: Payer: Self-pay | Admitting: Physician Assistant

## 2022-06-26 ENCOUNTER — Inpatient Hospital Stay: Payer: Medicare PPO | Attending: Oncology | Admitting: Oncology

## 2022-06-26 ENCOUNTER — Inpatient Hospital Stay: Payer: Medicare PPO

## 2022-06-26 VITALS — BP 150/65 | HR 64 | Temp 98.1°F | Resp 18 | Ht 65.0 in | Wt 157.8 lb

## 2022-06-26 DIAGNOSIS — Z923 Personal history of irradiation: Secondary | ICD-10-CM | POA: Diagnosis not present

## 2022-06-26 DIAGNOSIS — Z95828 Presence of other vascular implants and grafts: Secondary | ICD-10-CM

## 2022-06-26 DIAGNOSIS — C679 Malignant neoplasm of bladder, unspecified: Secondary | ICD-10-CM | POA: Diagnosis not present

## 2022-06-26 DIAGNOSIS — K219 Gastro-esophageal reflux disease without esophagitis: Secondary | ICD-10-CM | POA: Diagnosis not present

## 2022-06-26 DIAGNOSIS — Z853 Personal history of malignant neoplasm of breast: Secondary | ICD-10-CM | POA: Diagnosis not present

## 2022-06-26 DIAGNOSIS — I4891 Unspecified atrial fibrillation: Secondary | ICD-10-CM | POA: Diagnosis not present

## 2022-06-26 DIAGNOSIS — E041 Nontoxic single thyroid nodule: Secondary | ICD-10-CM | POA: Insufficient documentation

## 2022-06-26 DIAGNOSIS — Q631 Lobulated, fused and horseshoe kidney: Secondary | ICD-10-CM | POA: Diagnosis not present

## 2022-06-26 DIAGNOSIS — Z452 Encounter for adjustment and management of vascular access device: Secondary | ICD-10-CM | POA: Insufficient documentation

## 2022-06-26 DIAGNOSIS — C689 Malignant neoplasm of urinary organ, unspecified: Secondary | ICD-10-CM

## 2022-06-26 DIAGNOSIS — K589 Irritable bowel syndrome without diarrhea: Secondary | ICD-10-CM | POA: Insufficient documentation

## 2022-06-26 DIAGNOSIS — E119 Type 2 diabetes mellitus without complications: Secondary | ICD-10-CM | POA: Insufficient documentation

## 2022-06-26 DIAGNOSIS — Z8679 Personal history of other diseases of the circulatory system: Secondary | ICD-10-CM | POA: Insufficient documentation

## 2022-06-26 MED ORDER — SODIUM CHLORIDE 0.9% FLUSH
10.0000 mL | Freq: Once | INTRAVENOUS | Status: AC
  Administered 2022-06-26: 10 mL via INTRAVENOUS

## 2022-06-26 MED ORDER — HEPARIN SOD (PORK) LOCK FLUSH 100 UNIT/ML IV SOLN
500.0000 [IU] | Freq: Once | INTRAVENOUS | Status: AC
  Administered 2022-06-26: 500 [IU] via INTRAVENOUS

## 2022-06-26 NOTE — Progress Notes (Signed)
  [facility] OFFICE PROGRESS NOTE   Diagnosis: Urothelial carcinoma  INTERVAL HISTORY:   Cynthia [name] returns as scheduled.  She feels well.  No hematuria.  Good appetite.  She underwent biopsy of the left thyroid nodule on 06/12/2022.  The pathology revealed a Hurthle cell lesion.  The Afirma score returned consistent with a benign lesion and a 4% risk of malignancy.  Objective:  Vital signs in last 24 hours:  Blood pressure (!) 150/65, pulse 64, temperature 98.1 F (36.7 C), temperature source Oral, resp. rate 18, height 5\' 5"  (1.651 m), weight 157 lb 12.8 oz (71.6 kg), SpO2 98 %.    HEENT: Neck without mass no palpable thyroid nodule Lymphatics: No cervical, supraclavicular, axillary, or inguinal nodes Resp: Lungs clear bilaterally Cardio: Regular rate and rhythm GI: No hepatosplenomegaly, no mass, nontender Vascular: No leg edema   Lab Results:  Lab Results  Component Value Date   WBC 3.8 (L) 02/13/2022   HGB 11.4 (L) 02/13/2022   HCT 35.6 (L) 02/13/2022   MCV 90.4 02/13/2022   PLT 223 02/13/2022   NEUTROABS 1.9 02/13/2022    CMP  Lab Results  Component Value Date   NA 142 02/13/2022   K 4.1 02/13/2022   CL 104 02/13/2022   CO2 30 02/13/2022   GLUCOSE 85 02/13/2022   BUN 21 02/13/2022   CREATININE 1.03 (H) 02/13/2022   CALCIUM 9.4 02/13/2022   PROT 6.6 02/13/2022   ALBUMIN 3.8 02/13/2022   AST 17 02/13/2022   ALT 10 02/13/2022   ALKPHOS 35 (L) 02/13/2022   BILITOT 0.5 02/13/2022   GFRNONAA 55 (L) 02/13/2022   GFRAA 49 (L) 09/22/2019    Medications: I have reviewed the patient's current medications.   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, status post a left nephro ureterectomy 04/27/2019 Tumor extends to the muscularis, pT2, pN0 May 2023-recurrence at the left trigone area on surveillance cystoscopy-TURBT-invasive high-grade papillary urothelial carcinoma invading muscularis propria PET  07/29/2021-prior left nephro ureterectomy and TURBT, no evidence of metastatic disease Neoadjuvant gemcitabine/carboplatin for 4 cycles 07/17/2021-September 2023 PET 10/24/2021-no evidence of hypermetabolic metastatic disease, focal hypermetabolism in the sigmoid colon with associated diverticular wall thickening, hypermetabolic 1.8 cm left thyroid hyperdense nodule-unchanged  2.  Congenital left horseshoe kidney 3.  Right breast cancer, status post right lumpectomy and sentinel lymph node biopsy 04/01/2016-pT1bpN0, status post a right lumpectomy, adjuvant radiation, and 5 years of anastrozole (started 07/15/2016)  4.  Hypermetabolic left thyroid nodule on PET 10/24/2021 04/23/2022-thyroid ultrasound-1.6 cm solid nodule in the left lower gland corresponding with the FDG avid nodule 06/12/2022-biopsy of left inferior thyroid nodule-Hurthle cell lesion or neoplasm, [place] category 4, Afirma testing-benign, risk of malignancy 4% 5.  Diabetes 6.  Gastroesophageal reflux disease 7.  IBS 8.  History of atrial fibrillation/flutter 9.  History of NSVT      Disposition: Cynthia [name] is in clinical remission from urothelial carcinoma.  She continues cystoscopic surveillance with Dr. Berneice Heinrich  She will be scheduled for surveillance CTs and an office visit in October.  The hypermetabolic thyroid nodule was biopsied earlier this month.  The pathology is most consistent with a benign lesion.  We discussed referral to a thyroid surgeon.  She prefers to follow the lesion here.  We will plan for a repeat thyroid ultrasound in 1 year.    Thornton Papas, MD  06/26/2022  3:28 PM

## 2022-06-30 ENCOUNTER — Encounter (HOSPITAL_COMMUNITY): Payer: Self-pay

## 2022-06-30 DIAGNOSIS — R7303 Prediabetes: Secondary | ICD-10-CM | POA: Diagnosis not present

## 2022-06-30 DIAGNOSIS — I2510 Atherosclerotic heart disease of native coronary artery without angina pectoris: Secondary | ICD-10-CM | POA: Diagnosis not present

## 2022-06-30 DIAGNOSIS — Z0000 Encounter for general adult medical examination without abnormal findings: Secondary | ICD-10-CM | POA: Diagnosis not present

## 2022-06-30 DIAGNOSIS — I10 Essential (primary) hypertension: Secondary | ICD-10-CM | POA: Diagnosis not present

## 2022-06-30 DIAGNOSIS — E785 Hyperlipidemia, unspecified: Secondary | ICD-10-CM | POA: Diagnosis not present

## 2022-06-30 DIAGNOSIS — Z23 Encounter for immunization: Secondary | ICD-10-CM | POA: Diagnosis not present

## 2022-06-30 DIAGNOSIS — I700 Atherosclerosis of aorta: Secondary | ICD-10-CM | POA: Diagnosis not present

## 2022-06-30 DIAGNOSIS — E559 Vitamin D deficiency, unspecified: Secondary | ICD-10-CM | POA: Diagnosis not present

## 2022-06-30 DIAGNOSIS — E039 Hypothyroidism, unspecified: Secondary | ICD-10-CM | POA: Diagnosis not present

## 2022-06-30 DIAGNOSIS — G4700 Insomnia, unspecified: Secondary | ICD-10-CM | POA: Diagnosis not present

## 2022-07-01 DIAGNOSIS — G4700 Insomnia, unspecified: Secondary | ICD-10-CM | POA: Diagnosis not present

## 2022-07-01 DIAGNOSIS — F419 Anxiety disorder, unspecified: Secondary | ICD-10-CM | POA: Diagnosis not present

## 2022-07-01 DIAGNOSIS — F331 Major depressive disorder, recurrent, moderate: Secondary | ICD-10-CM | POA: Diagnosis not present

## 2022-07-08 NOTE — Progress Notes (Signed)
[employer] Summary Report / Loop Recorder 

## 2022-07-14 ENCOUNTER — Encounter: Payer: Self-pay | Admitting: Oncology

## 2022-07-16 ENCOUNTER — Ambulatory Visit: Payer: Medicare PPO | Attending: Internal Medicine

## 2022-07-16 DIAGNOSIS — I4729 Other ventricular tachycardia: Secondary | ICD-10-CM | POA: Diagnosis not present

## 2022-07-16 DIAGNOSIS — I480 Paroxysmal atrial fibrillation: Secondary | ICD-10-CM

## 2022-07-16 DIAGNOSIS — Z79899 Other long term (current) drug therapy: Secondary | ICD-10-CM

## 2022-07-16 DIAGNOSIS — E039 Hypothyroidism, unspecified: Secondary | ICD-10-CM

## 2022-07-16 DIAGNOSIS — R011 Cardiac murmur, unspecified: Secondary | ICD-10-CM | POA: Diagnosis not present

## 2022-07-17 DIAGNOSIS — Z1211 Encounter for screening for malignant neoplasm of colon: Secondary | ICD-10-CM | POA: Diagnosis not present

## 2022-07-17 LAB — HEPATIC FUNCTION PANEL
ALT: 8 IU/L (ref 0–32)
AST: 19 IU/L (ref 0–40)
Albumin: 3.9 g/dL (ref 3.8–4.8)
Alkaline Phosphatase: 47 IU/L (ref 44–121)
Bilirubin Total: 0.4 mg/dL (ref 0.0–1.2)
Bilirubin, Direct: 0.21 mg/dL (ref 0.00–0.40)
Total Protein: 6.4 g/dL (ref 6.0–8.5)

## 2022-07-17 LAB — TSH: TSH: 1.86 u[IU]/mL (ref 0.450–4.500)

## 2022-07-21 ENCOUNTER — Ambulatory Visit (INDEPENDENT_AMBULATORY_CARE_PROVIDER_SITE_OTHER): Payer: Medicare PPO

## 2022-07-21 DIAGNOSIS — I480 Paroxysmal atrial fibrillation: Secondary | ICD-10-CM | POA: Diagnosis not present

## 2022-07-22 LAB — CUP PACEART REMOTE DEVICE CHECK
Date Time Interrogation Session: 20240712230413
Implantable Pulse Generator Implant Date: 20210521

## 2022-08-04 NOTE — Progress Notes (Signed)
[employer] Summary Report / Loop Recorder 

## 2022-08-05 ENCOUNTER — Telehealth: Payer: Self-pay | Admitting: Internal Medicine

## 2022-08-05 ENCOUNTER — Ambulatory Visit: Payer: Medicare PPO | Attending: Cardiology | Admitting: Internal Medicine

## 2022-08-05 VITALS — Ht 63.0 in | Wt 153.0 lb

## 2022-08-05 DIAGNOSIS — R000 Tachycardia, unspecified: Secondary | ICD-10-CM | POA: Diagnosis not present

## 2022-08-05 DIAGNOSIS — I480 Paroxysmal atrial fibrillation: Secondary | ICD-10-CM

## 2022-08-05 DIAGNOSIS — I4891 Unspecified atrial fibrillation: Secondary | ICD-10-CM

## 2022-08-05 DIAGNOSIS — Z79899 Other long term (current) drug therapy: Secondary | ICD-10-CM

## 2022-08-05 DIAGNOSIS — R009 Unspecified abnormalities of heart beat: Secondary | ICD-10-CM

## 2022-08-05 NOTE — Telephone Encounter (Signed)
Per Dr. Odessa Fleming nurse, he is currently running 1.5 hours behind in clinic today.  Informed patient of the above and that Sheila Oats would be calling her prior to her call with Dr. Graciela Husbands. She verbalized understanding and expressed appreciation for follow-up. She states "Dr. Graciela Husbands is worth waiting for, I just wanted to be sure I did mess something up."

## 2022-08-05 NOTE — Addendum Note (Signed)
Addended by: Alois Cliche on: 08/05/2022 06:56 PM   Modules accepted: Orders

## 2022-08-05 NOTE — Telephone Encounter (Signed)
Pt states she was never called for telephone visit. Please advise

## 2022-08-05 NOTE — Patient Instructions (Addendum)
Medication Instructions:  Your physician recommends that you continue on your current medications as directed. Please refer to the Current Medication list given to you today.  *If you need a refill on your cardiac medications before your next appointment, please call your pharmacy*   Lab Work: None ordered.  If you have labs (blood work) drawn today and your tests are completely normal, you will receive your results only by: MyChart Message (if you have MyChart) OR A paper copy in the mail If you have any lab test that is abnormal or we need to change your treatment, we will call you to review the results.   Testing/Procedures: CBC - please call the office [phone] to schedule this appointment for lab work    Follow-Up: At [facility], you and your health needs are our priority.  As part of our continuing mission to provide you with exceptional heart care, we have created designated Provider Care Teams.  These Care Teams include your primary Cardiologist (physician) and Advanced Practice Providers (APPs -  Physician Assistants and Nurse Practitioners) who all work together to provide you with the care you need, when you need it.  We recommend signing up for the patient portal called "MyChart".  Sign up information is provided on this After Visit Summary.  MyChart is used to connect with patients for Virtual Visits (Telemedicine).  Patients are able to view lab/test results, encounter notes, upcoming appointments, etc.  Non-urgent messages can be sent to your provider as well.   To learn more about what you can do with MyChart, go to [email or web address].    Your next appointment:   4 week telephone visit with Dr Graciela Husbands - we will have his scheduler call you for this appointment.

## 2022-08-05 NOTE — Progress Notes (Signed)
Electrophysiology TeleHealth Note      Date:  08/05/2022   ID:  Semiah, Sistare 1945-09-25, MRN [id number]  Location: patient's home  Provider location: [address] [place]  Evaluation Performed: Follow-up visit  PCP:  Camie Patience, FNP  Cardiologist:     Electrophysiologist:  SK   Chief Complaint:  sluggishmess  History of Present Illness:    Cynthia [name] is a 77 y.o. female who presents via video conferencing for a telehealth visit today.  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  Amiodarone  story is not so clear from the [facility] in the chart   pt was running out of the door as I sought clarification   Date Cr K Hgb PLT  TSH LFTs  12/20         2.85 21  5/21 1.11 3.7 11.9<<13.1        6/21 1.17 3.8     0.654    3/22 1.00 3.7 12.8 391   18  2/24 1.03 4.4 11.4     10  7/24     1.86 19      Past Medical History:  Diagnosis Date   AICD (automatic cardioverter/defibrillator) present    Medtronic   Anticoagulant long-term use    eliquis--- managed by cardiology   Anxiety    Arthritis    Bladder cancer (HCC)    Bradycardia    Breast cancer (HCC)    left, s/p lumpectomy, Dr Darnelle Catalan   Cancer of kidney [facility])    left   DDD (degenerative disc disease), lumbar    Depression    Dyspnea    ON EXERTION   First degree heart block    Genetic testing 06/25/2016   Ms. Nelligan underwent genetic counseling and testing for hereditary cancer syndromes on 06/17/2016. Her results were negative for mutations in all 46 genes analyzed by [employer]'s 46-gene Common Hereditary Cancers  Panel. Genes analyzed include: APC, ATM, AXIN2, BARD1, BMPR1A, BRCA1, BRCA2, BRIP1, CDH1, CDKN2A, CHEK2, CTNNA1, DICER1, EPCAM, GREM1, HOXB13, KIT, MEN1, MLH1, MSH2, MSH3, MSH6, MUTYH, NBN,   GERD (gastroesophageal reflux disease)    occasional,  takes pepcid   Hematuria    History of radiation therapy 05/22/2016 to 06/20/2016   right breast cancer   Hyperlipidemia    Hypertension    followd by pcp---  (02-22-2019 per pt never had stress test)   Hypothyroidism    followed by pcp   IBS (irritable bowel syndrome)    Incomplete right bundle branch block    Insomnia    Malignant neoplasm of upper-inner quadrant of right breast in female, estrogen receptor positive [facility]) oncologist--- dr Darnelle Catalan   dx 03/ 2018,  Stage IA, IDC, ER/PR +;  04-01-2016 s/p  right breast lumpectomy w/ node dissection's;  completed radiation 06-20-2016   MVP (mitral valve prolapse)    per echo 12-11-2018 in epic, mild    NSVT (nonsustained ventricular tachycardia) (HCC) followed by cardiology   12-10-2018  hospital admission ,  refer to discharge note 12-14-2018 for treatement   PAF (paroxysmal atrial fibrillation) [facility]) primary cardiologist--- dr  acharya   newly dx 12-10-2018  admission in epic, Afib w/ RVR and NSVT;   in epic TTE 12-11-2018 showed ef 50-55%, mild MVP,  mild AV sclerosis without stenosis;   Cardiac MRI 12-13-2018 in epic   Prediabetes    Renal mass, left    pelvis    Restless leg syndrome    occasional   RLS (restless legs syndrome)    Type 2 diabetes mellitus (HCC)    followed by pcp---  (02-22-2019 does not check blood sugar's)    Past Surgical History:  Procedure Laterality Date   BREAST LUMPECTOMY WITH RADIOACTIVE SEED AND SENTINEL LYMPH NODE BIOPSY Right 04/01/2016   Procedure: RADIOACTIVE SEED GUIDED RIGHT BREAST LUMPECTOMY WITH RIGHT AXILLARY SENTINEL LYMPH NODE BIOPSY.;  Surgeon: Claud Kelp, MD;  Location: [facility] OR;  Service: General;  Laterality: Right;   CARDIAC CATHETERIZATION   12/11/2018   CATARACT EXTRACTION W/ INTRAOCULAR LENS  IMPLANT, BILATERAL  1980s   COLONOSCOPY     CYSTOSCOPY W/ RETROGRADES Right 06/05/2021   Procedure: CYSTOSCOPY WITH RETROGRADE PYELOGRAM;  Surgeon: Sebastian Ache, MD;  Location: [facility];  Service: Urology;  Laterality: Right;   CYSTOSCOPY/RETROGRADE/URETEROSCOPY N/A 03/02/2019   Procedure: CYSTOSCOPY/LEFT RETROGRADE/URETEROSCOPY/  BIOPSY/  STENT;  Surgeon: Rene Paci, MD;  Location: [facility];  Service: Urology;  Laterality: N/A;   INCISIONAL HERNIA REPAIR N/A 03/29/2020   Procedure: REPAIR OF INCISIONAL HERNIA WITH MESH;  Surgeon: Harriette Bouillon, MD;  Location: [facility] OR;  Service: General;  Laterality: N/A;   INSERTION OF ICD     medtronic   IR IMAGING GUIDED PORT INSERTION  07/18/2021   LAPAROSCOPIC CHOLECYSTECTOMY  1990s   ROBOT ASSITED LAPAROSCOPIC NEPHROURETERECTOMY Left 04/27/2019   Procedure: XI ROBOT ASSITED LAPAROSCOPIC NEPHROURETERECTOMY;  Surgeon: Sebastian Ache, MD;  Location: [facility];  Service: Urology;  Laterality: Left;  3.5 HRS   TONSILLECTOMY  age 68   TRANSURETHRAL RESECTION OF BLADDER TUMOR N/A 06/05/2021   Procedure: TRANSURETHRAL RESECTION OF BLADDER TUMOR (TURBT);  Surgeon: Sebastian Ache, MD;  Location: [facility];  Service: Urology;  Laterality: N/A;    Current Outpatient Medications  Medication Sig Dispense Refill   acetaminophen (TYLENOL) 500 MG tablet Take 1,000 mg by mouth at bedtime.     ALPRAZolam (XANAX) 0.5 MG tablet Take 0.5 mg by mouth 3 (three) times daily as needed.     amiodarone (PACERONE) 200 MG tablet TAKE 1 TABLET BY MOUTH EVERY DAY 90 tablet 3   Cholecalciferol (VITAMIN D-3) 25 MCG (1000 UT) CAPS Take 2,000 Units by mouth daily with breakfast.     ELIQUIS 5 MG TABS tablet TAKE 1 TABLET BY MOUTH TWICE A DAY 60 tablet 5   ferrous sulfate 325 (65 FE) MG tablet Take 325 mg by mouth at bedtime.     gabapentin (NEURONTIN) 100 MG capsule Take 100 mg by mouth at bedtime.      levothyroxine (SYNTHROID, LEVOTHROID) 112 MCG tablet Take 112 mcg by mouth daily.     MAGNESIUM PO Take 250 mg by mouth daily.     omeprazole (PRILOSEC) 40 MG capsule Take 40 mg by mouth daily.     Psyllium (METAMUCIL PO) Take 5 each by mouth daily as needed.     traZODone (DESYREL) 100 MG tablet Take 1 1/2 tablet by mouth (150 mg Total) at bedtime     Choline Fenofibrate (FENOFIBRIC ACID) 135 MG CPDR Take 135 mg by mouth daily.  (Patient not taking: Reported on 08/05/2022)     No  current facility-administered medications for this visit.    Allergies:   Patient has no known allergies.   ROS:  Please see the history of present illness.   All other systems are personally reviewed and negative.    Exam:    Vital Signs:  Ht 5\' 3"  (1.6 m)   Wt 153 lb (69.4 kg)   BMI 27.10 kg/m     Recent Labs: 10/08/2021: Magnesium 2.1 02/13/2022: BUN 21; Creatinine 1.03; Hemoglobin 11.4; Platelet Count 223; Potassium 4.1; Sodium 142 07/16/2022: ALT 8; TSH 1.860   Wt Readings from Last 3 Encounters:  08/05/22 153 lb (69.4 kg)  06/26/22 157 lb 12.8 oz (71.6 kg)  06/19/22 153 lb (69.4 kg)     Other studies personally reviewed: Additional studies/ records that were reviewed today include: (As above)     ASSESSMENT & PLAN:    Atrial fibrillation with a rapid rate   Presyncope/syncope -probably orthostatic   Posttermination pauses  High Risk Medication Surveillance amiodarone   Sinus node slowing   Wide-complex tachycardia-regular >> fasicular VT   Hypertension   Anemia (relative)   Psychosocial stress-husband with brain cancer  Post prandial sluggishness may be related to postprandial hypotension we will have her check her blood pressure before meals and then 30 minutes afterwards  Suggested that her her sluggishness may be related to atrial fibrillation, but she did not really record these data well; we will repeat the collection for 4 weeks with heart rates been associated with how she  feels on a scale of 1-3.  Continue the amiodarone, no bleeding clinically on the Eliquis we will continue it dosed for age and renal function correct    Follow-up:  4weeks telehealth visit      Current medicines are reviewed at length with the patient today.   The patient does not have concerns regarding her medicines.  The following changes were made today:  none  Labs/ tests ordered today include: Needs a repeat CBC No orders of the defined types were placed in this encounter.     Today, I have spent 21 minutes with the patient with telehealth technology discussing the above.  Signed, Sherryl Manges, MD  08/05/2022 6:44 PM     [facility] [address] [place] [address] [phone] (office) [phone] (fax)

## 2022-08-14 ENCOUNTER — Telehealth: Payer: Self-pay | Admitting: Internal Medicine

## 2022-08-14 NOTE — Telephone Encounter (Signed)
Patient is following up to provide further information on the her history of her amiodarone (PACERONE) 200 MG tablet medication.

## 2022-08-14 NOTE — Telephone Encounter (Signed)
Attempted phone call to pt and left voicemail message to contact office at [phone]. 

## 2022-08-18 ENCOUNTER — Telehealth: Payer: Self-pay | Admitting: Internal Medicine

## 2022-08-18 NOTE — Telephone Encounter (Signed)
Pt wanted to let Dr. Ambrose Pancoast RN know the history changes of  amiodarone (PACERONE) 200 MG tablet.  Feb '23 - 200mg  Monday thru Friday July '23 - 100mg  once daily Jan '24 - 200mg  twice daily - pt is currently still at this dosage.

## 2022-08-21 LAB — CUP PACEART REMOTE DEVICE CHECK
Date Time Interrogation Session: 20240814231326
Implantable Pulse Generator Implant Date: 20210521

## 2022-08-25 ENCOUNTER — Ambulatory Visit (INDEPENDENT_AMBULATORY_CARE_PROVIDER_SITE_OTHER): Payer: Medicare PPO

## 2022-08-25 DIAGNOSIS — C678 Malignant neoplasm of overlapping sites of bladder: Secondary | ICD-10-CM | POA: Diagnosis not present

## 2022-08-25 DIAGNOSIS — R55 Syncope and collapse: Secondary | ICD-10-CM

## 2022-08-25 NOTE — Telephone Encounter (Signed)
Attempted phone call to pt and left voicemail message to contact RN at [phone]. 

## 2022-08-26 NOTE — Telephone Encounter (Signed)
Spoke with pt who confirms she is currently taking Amiodarone 200mg  - 1 tablet by mouth daily.  This is the dose that is also currently listed on her MAR.

## 2022-08-26 NOTE — Telephone Encounter (Signed)
Patient is returning call.  °

## 2022-08-27 DIAGNOSIS — E1140 Type 2 diabetes mellitus with diabetic neuropathy, unspecified: Secondary | ICD-10-CM | POA: Diagnosis not present

## 2022-08-27 DIAGNOSIS — L84 Corns and callosities: Secondary | ICD-10-CM | POA: Diagnosis not present

## 2022-09-04 NOTE — Progress Notes (Signed)
[employer] Summary Report / Loop Recorder 

## 2022-09-12 ENCOUNTER — Encounter (HOSPITAL_BASED_OUTPATIENT_CLINIC_OR_DEPARTMENT_OTHER): Payer: Self-pay | Admitting: Emergency Medicine

## 2022-09-12 ENCOUNTER — Emergency Department (HOSPITAL_BASED_OUTPATIENT_CLINIC_OR_DEPARTMENT_OTHER)
Admission: EM | Admit: 2022-09-12 | Discharge: 2022-09-12 | Disposition: A | Discharge status: Home / Self Care | Payer: Medicare PPO | Attending: Emergency Medicine | Admitting: Emergency Medicine

## 2022-09-12 ENCOUNTER — Other Ambulatory Visit: Payer: Self-pay

## 2022-09-12 DIAGNOSIS — R197 Diarrhea, unspecified: Secondary | ICD-10-CM | POA: Insufficient documentation

## 2022-09-12 DIAGNOSIS — E119 Type 2 diabetes mellitus without complications: Secondary | ICD-10-CM | POA: Diagnosis not present

## 2022-09-12 DIAGNOSIS — Z85528 Personal history of other malignant neoplasm of kidney: Secondary | ICD-10-CM | POA: Diagnosis not present

## 2022-09-12 DIAGNOSIS — R110 Nausea: Secondary | ICD-10-CM | POA: Diagnosis not present

## 2022-09-12 DIAGNOSIS — Z8551 Personal history of malignant neoplasm of bladder: Secondary | ICD-10-CM | POA: Insufficient documentation

## 2022-09-12 DIAGNOSIS — Z7901 Long term (current) use of anticoagulants: Secondary | ICD-10-CM | POA: Insufficient documentation

## 2022-09-12 DIAGNOSIS — Z95810 Presence of automatic (implantable) cardiac defibrillator: Secondary | ICD-10-CM | POA: Insufficient documentation

## 2022-09-12 DIAGNOSIS — I10 Essential (primary) hypertension: Secondary | ICD-10-CM | POA: Diagnosis not present

## 2022-09-12 DIAGNOSIS — E039 Hypothyroidism, unspecified: Secondary | ICD-10-CM | POA: Insufficient documentation

## 2022-09-12 DIAGNOSIS — Z853 Personal history of malignant neoplasm of breast: Secondary | ICD-10-CM | POA: Insufficient documentation

## 2022-09-12 DIAGNOSIS — R1111 Vomiting without nausea: Secondary | ICD-10-CM | POA: Diagnosis not present

## 2022-09-12 LAB — CBC WITH DIFFERENTIAL/PLATELET
Abs Immature Granulocytes: 0.04 10*3/uL (ref 0.00–0.07)
Basophils Absolute: 0 10*3/uL (ref 0.0–0.1)
Basophils Relative: 0 %
Eosinophils Absolute: 0.1 10*3/uL (ref 0.0–0.5)
Eosinophils Relative: 2 %
HCT: 32.2 % — ABNORMAL LOW (ref 36.0–46.0)
Hemoglobin: 10.4 g/dL — ABNORMAL LOW (ref 12.0–15.0)
Immature Granulocytes: 1 %
Lymphocytes Relative: 37 %
Lymphs Abs: 2.1 10*3/uL (ref 0.7–4.0)
MCH: 27.5 pg (ref 26.0–34.0)
MCHC: 32.3 g/dL (ref 30.0–36.0)
MCV: 85.2 fL (ref 80.0–100.0)
Monocytes Absolute: 0.5 10*3/uL (ref 0.1–1.0)
Monocytes Relative: 9 %
Neutro Abs: 2.9 10*3/uL (ref 1.7–7.7)
Neutrophils Relative %: 51 %
Platelets: 164 10*3/uL (ref 150–400)
RBC: 3.78 MIL/uL — ABNORMAL LOW (ref 3.87–5.11)
RDW: 19.5 % — ABNORMAL HIGH (ref 11.5–15.5)
WBC: 5.6 10*3/uL (ref 4.0–10.5)
nRBC: 0 % (ref 0.0–0.2)

## 2022-09-12 LAB — COMPREHENSIVE METABOLIC PANEL
ALT: 15 U/L (ref 0–44)
AST: 30 U/L (ref 15–41)
Albumin: 3.7 g/dL (ref 3.5–5.0)
Alkaline Phosphatase: 33 U/L — ABNORMAL LOW (ref 38–126)
Anion gap: 8 (ref 5–15)
BUN: 18 mg/dL (ref 8–23)
CO2: 29 mmol/L (ref 22–32)
Calcium: 8.6 mg/dL — ABNORMAL LOW (ref 8.9–10.3)
Chloride: 101 mmol/L (ref 98–111)
Creatinine, Ser: 0.94 mg/dL (ref 0.44–1.00)
GFR, Estimated: >60 mL/min (ref >60)
Glucose, Bld: 79 mg/dL (ref 70–99)
Potassium: 3.9 mmol/L (ref 3.5–5.1)
Sodium: 138 mmol/L (ref 135–145)
Total Bilirubin: 0.6 mg/dL (ref 0.3–1.2)
Total Protein: 6.1 g/dL — ABNORMAL LOW (ref 6.5–8.1)

## 2022-09-12 LAB — LIPASE, BLOOD: Lipase: 29 U/L (ref 11–51)

## 2022-09-12 MED ORDER — SODIUM CHLORIDE 0.9 % IV BOLUS
500.0000 mL | Freq: Once | INTRAVENOUS | Status: AC
  Administered 2022-09-12: 500 mL via INTRAVENOUS

## 2022-09-12 MED ORDER — LOPERAMIDE HCL 2 MG PO CAPS
4.0000 mg | ORAL_CAPSULE | Freq: Once | ORAL | Status: AC
  Administered 2022-09-12: 4 mg via ORAL
  Filled 2022-09-12: qty 2

## 2022-09-12 NOTE — ED Provider Notes (Signed)
[place] EMERGENCY DEPARTMENT AT [facility] Provider Note   CSN: [id number] Arrival date & time: 09/12/22  0300     History  Chief Complaint  Patient presents with   Diarrhea    Cynthia [name] is a 77 y.o. female.  HPI     This is a 77 year old female who presents with diarrhea.  Patient reports initially she had onset of symptoms on Sunday night.  Her daughter-in-law who lives in the same house works for daycare and had symptoms 1 week ago.  She states that she had copious diarrhea on Sunday.  She also had 1 episode of emesis on Monday.  She has had decreased p.o. intake.  However, she thought she was getting better by Wednesday.  She began to eat more.  Tonight however she has not had multiple episodes of watery stool.  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  She has not had any fevers.  He did take 1 Imodium on Sunday.  Home Medications Prior to Admission medications   Medication Sig Start Date End Date Taking? Authorizing Provider  acetaminophen (TYLENOL) 500 MG tablet Take 1,000 mg by mouth at bedtime.    [provider]  ALPRAZolam Prudy Feeler) 0.5 MG tablet Take 0.5 mg by mouth 3 (three) times daily as needed.    [provider]  amiodarone (PACERONE) 200 MG tablet TAKE 1 TABLET BY MOUTH EVERY DAY 06/23/22   Sheilah Pigeon, PA-C  Cholecalciferol (VITAMIN D-3) 25 MCG (1000 UT) CAPS Take 2,000 Units by mouth daily with breakfast.    [provider]  Choline Fenofibrate (FENOFIBRIC ACID) 135 MG CPDR Take 135 mg by mouth daily.  Patient not taking: Reported on 08/05/2022 01/16/16   [provider]  ELIQUIS 5 MG TABS tablet TAKE 1 TABLET BY MOUTH TWICE A DAY 03/24/22   Duke Salvia, MD  ferrous sulfate 325 (65 FE) MG tablet Take 325 mg by mouth at bedtime.    [provider]  gabapentin (NEURONTIN) 100 MG capsule Take 100 mg by mouth at bedtime. 03/26/22   [provider]  levothyroxine (SYNTHROID, LEVOTHROID) 112 MCG tablet Take 112 mcg by mouth daily. 03/13/16   [provider]  MAGNESIUM PO Take 250 mg by mouth daily.    [provider]  omeprazole (PRILOSEC) 40 MG capsule Take 40 mg by mouth daily.    [provider]  Psyllium (METAMUCIL PO) Take 5 each by mouth daily as needed.    [provider]  traZODone (DESYREL) 100 MG tablet Take 1 1/2 tablet by mouth (150 mg Total) at bedtime    [provider]      Allergies    Patient has no known allergies.    Review of Systems   Review of Systems  Constitutional:  Negative for fever.  Respiratory:  Negative for shortness of breath.   Cardiovascular:  Negative for chest pain.  Gastrointestinal:  Positive for diarrhea and vomiting. Negative for abdominal pain and blood in stool.  All other systems reviewed and are negative.   Physical Exam Updated Vital Signs BP 138/65   Pulse 61   Temp 98.7 F (37.1 C)   Resp 18   Ht 1.6 m (5\' 3" )   Wt 69.4 kg   SpO2 99%   BMI 27.10 kg/m  Physical Exam Vitals and nursing note reviewed.  Constitutional:      Appearance: She is well-developed. She is not ill-appearing.  HENT:     Head: Normocephalic and atraumatic.  Eyes:     Pupils: Pupils are equal, round, and reactive to light.  Cardiovascular:     Rate and Rhythm: Normal rate and regular rhythm.     Heart sounds: Normal heart sounds.  Pulmonary:     Effort: Pulmonary effort is normal. No respiratory distress.     Breath sounds: No wheezing.  Abdominal:     General: Bowel sounds are normal.     Palpations: Abdomen is soft.     Tenderness: There is no abdominal tenderness. There is no guarding or rebound.  Musculoskeletal:     Cervical back: Neck supple.  Skin:    General: Skin is warm and dry.  Neurological:     Mental Status: She is alert and oriented to person, place, and time.  Psychiatric:        Mood and Affect: Mood normal.      ED Results / Procedures / Treatments   Labs (all labs ordered are listed, but only abnormal results are displayed) Labs Reviewed  CBC WITH DIFFERENTIAL/PLATELET - Abnormal; Notable for the following components:      Result Value   RBC 3.78 (*)    Hemoglobin 10.4 (*)    HCT 32.2 (*)    RDW 19.5 (*)    All other components within normal limits  COMPREHENSIVE METABOLIC PANEL - Abnormal; Notable for the following components:   Calcium 8.6 (*)    Total Protein 6.1 (*)    Alkaline Phosphatase 33 (*)    All other components within normal limits  LIPASE, BLOOD    EKG None  Radiology No results found.  Procedures Procedures    Medications Ordered in ED Medications  loperamide (IMODIUM) capsule 4 mg (4 mg Oral Given 09/12/22 0316)  sodium chloride 0.9 % bolus 500 mL (0 mLs Intravenous Stopped 09/12/22 0355)    ED Course/ Medical Decision Making/ A&P                                 Medical Decision Making Amount and/or Complexity of Data Reviewed Labs: ordered.  Risk Prescription drug management.   This patient presents to the ED for concern of diarrhea, this involves an extensive number of treatment options, and is a complaint that carries with it a high risk of complications and morbidity.  I considered the following differential and admission for this acute, potentially life threatening condition.  The differential diagnosis includes viral illness, infectious diarrhea, IBS, colitis, dehydration, electrolyte derangement  MDM:    This is a 77 year old female who presents with concern for diarrhea.  She is overall nontoxic and vital signs are reassuring.  She is concerned regarding dehydration.  She appears well-hydrated on exam.  However, she was given fluids and basic lab work was obtained.  CBC, CMP, lipase are all normal.  Patient was reassured.  I did give her 4 mg of Imodium.  Given sick contact, highly suspect viral etiology.  Benign abdominal exam.  Do not feel she  needs advanced imaging.  She is otherwise low risk for bacterial source.  Recommend electrolyte rich fluids at home.  (Labs, imaging, consults)  Labs: I Ordered, and personally interpreted labs.  The pertinent results include: CBC, CMP, lipase  Imaging Studies ordered: I ordered imaging studies including none I independently visualized and interpreted imaging. I agree with the radiologist interpretation  Additional history obtained from chart review.  External records from outside source obtained and reviewed including prior evaluations  Cardiac Monitoring: The patient was maintained on a cardiac monitor.  If on the cardiac monitor, I personally viewed and interpreted the cardiac monitored which showed an underlying rhythm of: Sinus rhythm  Reevaluation: After the interventions noted above, I reevaluated the patient and found that they have :stayed the same  Social Determinants of Health:  lives independently  Disposition: Discharge  Co morbidities that complicate the patient evaluation  Past Medical History:  Diagnosis Date   AICD (automatic cardioverter/defibrillator) present    Medtronic   Anticoagulant long-term use    eliquis--- managed by cardiology   Anxiety    Arthritis    Bladder cancer (HCC)    Bradycardia    Breast cancer (HCC)    left, s/p lumpectomy, Dr Darnelle Catalan   Cancer of kidney [facility])    left   DDD (degenerative disc disease), lumbar    Depression    Dyspnea    ON EXERTION   First degree heart block    Genetic testing 06/25/2016   Ms. Hubble underwent genetic counseling and testing for hereditary cancer syndromes on 06/17/2016. Her results were negative for mutations in all 46 genes analyzed by [employer]'s 46-gene Common Hereditary Cancers Panel. Genes analyzed include: APC, ATM, AXIN2, BARD1, BMPR1A, BRCA1, BRCA2, BRIP1, CDH1, CDKN2A, CHEK2, CTNNA1, DICER1, EPCAM, GREM1, HOXB13, KIT, MEN1, MLH1, MSH2, MSH3, MSH6, MUTYH, NBN,   GERD (gastroesophageal  reflux disease)    occasional,  takes pepcid   Hematuria    History of radiation therapy 05/22/2016 to 06/20/2016   right breast cancer   Hyperlipidemia    Hypertension    followd by pcp---  (02-22-2019 per pt never had stress test)   Hypothyroidism    followed by pcp   IBS (irritable bowel syndrome)    Incomplete right bundle branch block    Insomnia    Malignant neoplasm of upper-inner quadrant of right breast in female, estrogen receptor positive [facility]) oncologist--- dr Darnelle Catalan   dx 03/ 2018,  Stage IA, IDC, ER/PR +;  04-01-2016 s/p  right breast lumpectomy w/ node dissection's;  completed radiation 06-20-2016   MVP (mitral valve prolapse)    per echo 12-11-2018 in epic, mild    NSVT (nonsustained ventricular tachycardia) (HCC) followed by cardiology   12-10-2018  hospital admission ,  refer to discharge note 12-14-2018 for treatement   PAF (paroxysmal atrial fibrillation) [facility]) primary cardiologist--- dr Jacques Navy   newly dx 12-10-2018  admission in epic, Afib w/ RVR and NSVT;   in epic TTE 12-11-2018 showed ef 50-55%, mild MVP,  mild AV sclerosis without stenosis;   Cardiac MRI 12-13-2018 in epic   Prediabetes    Renal mass, left    pelvis    Restless leg syndrome    occasional   RLS (restless legs syndrome)    Type 2 diabetes mellitus (HCC)    followed by pcp---  (02-22-2019 does not check blood sugar's)     Medicines Meds ordered this encounter  Medications   loperamide (IMODIUM) capsule 4 mg   sodium chloride 0.9 % bolus 500 mL    I have reviewed the patients home medicines and have made adjustments as needed  Problem List / ED Course: Problem List Items Addressed This Visit   None Visit Diagnoses     Diarrhea, unspecified type    -  Primary  Final Clinical Impression(s) / ED Diagnoses Final diagnoses:  Diarrhea, unspecified type    Rx / DC Orders ED Discharge Orders     None         Maximos Zayas, Mayer Masker, MD 09/12/22  [phone]

## 2022-09-12 NOTE — ED Triage Notes (Addendum)
Pt in via [facility] with frequent diarrhea (10-12 episodes tonight). Pt states she has hx of IBS, and that there is a GI bug going around at her house. Diarrhea with emesis started Monday, improved the past few days and worsened again tonight. VSS enroute, denies any abdominal pain or blood in stools

## 2022-09-12 NOTE — ED Notes (Signed)
Patient verbalizes understanding of discharge instructions. Opportunity for questioning and answers were provided. Armband removed by staff, pt discharged from ED. Wheeled to lobby, awaiting ride home from [employer]

## 2022-09-12 NOTE — Discharge Instructions (Signed)
You were seen today with concerns for diarrhea.  Your labs are reassuring.  Continue Imodium as needed.  Make sure that you are drinking electrolyte rich fluids.

## 2022-09-18 DIAGNOSIS — H04123 Dry eye syndrome of bilateral lacrimal glands: Secondary | ICD-10-CM | POA: Diagnosis not present

## 2022-09-29 ENCOUNTER — Ambulatory Visit (INDEPENDENT_AMBULATORY_CARE_PROVIDER_SITE_OTHER): Payer: Medicare PPO

## 2022-09-29 DIAGNOSIS — R55 Syncope and collapse: Secondary | ICD-10-CM | POA: Diagnosis not present

## 2022-10-05 ENCOUNTER — Other Ambulatory Visit: Payer: Self-pay | Admitting: Internal Medicine

## 2022-10-05 DIAGNOSIS — I4891 Unspecified atrial fibrillation: Secondary | ICD-10-CM

## 2022-10-06 NOTE — Telephone Encounter (Signed)
Prescription refill request for Eliquis received. Indication:afib Last office visit:7/24 Scr:0.94  9/24 Age: 77 Weight:69.4  kg  Prescription refilled

## 2022-10-10 ENCOUNTER — Telehealth: Payer: Self-pay

## 2022-10-10 NOTE — Telephone Encounter (Signed)
10/4 @ 4:28 pm Patient called to be reschedule her CT scan appointment on 10/21.  Patient scheduled with [facility] at [facility].  Called [facility] and transferred call as requested from staff.

## 2022-10-13 NOTE — Progress Notes (Signed)
[employer] Summary Report / Loop Recorder 

## 2022-10-14 ENCOUNTER — Ambulatory Visit: Payer: Medicare PPO | Attending: Internal Medicine | Admitting: Internal Medicine

## 2022-10-14 ENCOUNTER — Encounter: Payer: Self-pay | Admitting: Internal Medicine

## 2022-10-14 DIAGNOSIS — I4891 Unspecified atrial fibrillation: Secondary | ICD-10-CM | POA: Diagnosis not present

## 2022-10-14 NOTE — Patient Instructions (Signed)
Medication Instructions:  The current medical regimen is effective;  continue present plan and medications as directed. Please refer to the Current Medication list given to you today.   *If you need a refill on your cardiac medications before your next appointment, please call your pharmacy*   Follow-Up: At [facility], you and your health needs are our priority.  As part of our continuing mission to provide you with exceptional heart care, we have created designated Provider Care Teams.  These Care Teams include your primary Cardiologist (physician) and Advanced Practice Providers (APPs -  Physician Assistants and Nurse Practitioners) who all work together to provide you with the care you need, when you need it.  We recommend signing up for the patient portal called "MyChart".  Sign up information is provided on this After Visit Summary.  MyChart is used to connect with patients for Virtual Visits (Telemedicine).  Patients are able to view lab/test results, encounter notes, upcoming appointments, etc.  Non-urgent messages can be sent to your provider as well.   To learn more about what you can do with MyChart, go to [email or web address].    Your next appointment:   6 month(s)  Provider:   Sherryl Manges, MD

## 2022-10-14 NOTE — Progress Notes (Signed)
Electrophysiology TeleHealth Note      Date:  10/14/2022   ID:  Cynthia, [name] 1945-05-06, MRN [id number]  Location: patient's home  Provider location: [address] [place]  Evaluation Performed: Follow-up visit  PCP:  Camie Patience, FNP  Cardiologist:    Electrophysiologist:  SK   Chief Complaint:  afib  History of Present Illness:   My location is [facility]. The Patients location is their home. Cynthia [name] is a 77 y.o. female who presents via audio  conferencing for a telehealth visit today.  Since last being seen in our clinic for intermittent symptoms of sluggishness and known paroxysms of atrial fibrillation for which she takes amiodarone complicated in part by bradycardia and hoping to correlate her symptoms between sinus bradycardia (heart rates in the 60s and atrial fibrillation with a heart rates over 80s, the patient reports doing better-- Mostly HR 50-60s>>  Nervous/stressed HR 100 some shortness of breath, no chest pain   Patient denies symptoms of respiratory, GI intolerance, sun sensitivity, neurological symptoms attributable to amiodarone.       Continues tons of stress with husband illness and falls( they are better)  Hospice at home is to be consulted over the next weeks    DATE TEST EF    12/20 Echo   50-55 %    12/20 cMRI  *52* % ? noncompaction   2/24 Echo  60-65%      Date Cr K Hgb PLT  TSH LFTs  12/20         2.85 21  5/21 1.11 3.7 11.9<<13.1        6/21 1.17 3.8     0.654    3/22 1.00 3.7 12.8 391   18  2/24 1.03 4.4 11.4     10  9/24 0.94 3.9 10.4  1.8 (7/24) 15 (7/24)       The patient denies symptoms of fevers, chills, cough, or new SOB worrisome for COVID 19.    Past Medical History:  Diagnosis Date   AICD (automatic cardioverter/defibrillator) present    Medtronic   Anticoagulant long-term use    eliquis--- managed by cardiology   Anxiety    Arthritis    Bladder cancer (HCC)    Bradycardia     Breast cancer (HCC)    left, s/p lumpectomy, Dr Cynthia [name]   Cancer of kidney [facility])    left   DDD (degenerative disc disease), lumbar    Depression    Dyspnea    ON EXERTION   First degree heart block    Genetic testing 06/25/2016   Ms. Methvin underwent genetic counseling and testing for hereditary cancer syndromes on 06/17/2016. Her results were negative for mutations in all 46 genes analyzed by [facility]'s 46-gene Common Hereditary Cancers Panel. Genes analyzed include: APC, ATM, AXIN2, BARD1, BMPR1A, BRCA1, BRCA2, BRIP1, CDH1, CDKN2A, CHEK2, CTNNA1, DICER1, EPCAM, GREM1, HOXB13, KIT, MEN1, MLH1, MSH2, MSH3, MSH6, MUTYH, NBN,   GERD (gastroesophageal reflux disease)    occasional,  takes pepcid   Hematuria    History of radiation therapy 05/22/2016 to 06/20/2016   right breast cancer   Hyperlipidemia    Hypertension    followd by pcp---  (02-22-2019 per pt never had stress test)   Hypothyroidism    followed by pcp   IBS (irritable bowel syndrome)    Incomplete right bundle branch block    Insomnia    Malignant neoplasm of upper-inner quadrant of right breast  in female, estrogen receptor positive [facility]) oncologist--- dr Cynthia [name]   dx 03/ 2018,  Stage IA, IDC, ER/PR +;  04-01-2016 s/p  right breast lumpectomy w/ node dissection's;  completed radiation 06-20-2016   MVP (mitral valve prolapse)    per echo 12-11-2018 in epic, mild    NSVT (nonsustained ventricular tachycardia) (HCC) followed by cardiology   12-10-2018  hospital admission ,  refer to discharge note 12-14-2018 for treatement   PAF (paroxysmal atrial fibrillation) [facility]) primary cardiologist--- dr Jacques Navy   newly dx 12-10-2018  admission in epic, Afib w/ RVR and NSVT;   in epic TTE 12-11-2018 showed ef 50-55%, mild MVP,  mild AV sclerosis without stenosis;   Cardiac MRI 12-13-2018 in epic   Prediabetes    Renal mass, left    pelvis    Restless leg syndrome    occasional   RLS (restless legs syndrome)    Type 2 diabetes  mellitus (HCC)    followed by pcp---  (02-22-2019 does not check blood sugar's)    Past Surgical History:  Procedure Laterality Date   BREAST LUMPECTOMY WITH RADIOACTIVE SEED AND SENTINEL LYMPH NODE BIOPSY Right 04/01/2016   Procedure: RADIOACTIVE SEED GUIDED RIGHT BREAST LUMPECTOMY WITH RIGHT AXILLARY SENTINEL LYMPH NODE BIOPSY.;  Surgeon: Cynthia Kelp, MD;  Location: [facility] OR;  Service: General;  Laterality: Right;   CARDIAC CATHETERIZATION  12/11/2018   CATARACT EXTRACTION W/ INTRAOCULAR LENS  IMPLANT, BILATERAL  1980s   COLONOSCOPY     CYSTOSCOPY W/ RETROGRADES Right 06/05/2021   Procedure: CYSTOSCOPY WITH RETROGRADE PYELOGRAM;  Surgeon: Cynthia Ache, MD;  Location: [facility];  Service: Urology;  Laterality: Right;   CYSTOSCOPY/RETROGRADE/URETEROSCOPY N/A 03/02/2019   Procedure: CYSTOSCOPY/LEFT RETROGRADE/URETEROSCOPY/  BIOPSY/  STENT;  Surgeon: Cynthia Paci, MD;  Location: [facility];  Service: Urology;  Laterality: N/A;   INCISIONAL HERNIA REPAIR N/A 03/29/2020   Procedure: REPAIR OF INCISIONAL HERNIA WITH MESH;  Surgeon: Cynthia Bouillon, MD;  Location: [facility] OR;  Service: General;  Laterality: N/A;   INSERTION OF ICD     medtronic   IR IMAGING GUIDED PORT INSERTION  07/18/2021   LAPAROSCOPIC CHOLECYSTECTOMY  1990s   ROBOT ASSITED LAPAROSCOPIC NEPHROURETERECTOMY Left 04/27/2019   Procedure: XI ROBOT ASSITED LAPAROSCOPIC NEPHROURETERECTOMY;  Surgeon: Cynthia Ache, MD;  Location: [facility];  Service: Urology;  Laterality: Left;  3.5 HRS   TONSILLECTOMY  age 42   TRANSURETHRAL RESECTION OF BLADDER TUMOR N/A 06/05/2021   Procedure: TRANSURETHRAL RESECTION OF BLADDER TUMOR (TURBT);  Surgeon: Cynthia Ache, MD;  Location: [facility];  Service: Urology;  Laterality: N/A;    Current Outpatient Medications  Medication Sig Dispense Refill   acetaminophen (TYLENOL) 500 MG tablet Take 1,000 mg by mouth at bedtime.     ALPRAZolam (XANAX) 0.5 MG tablet Take 0.5 mg by mouth 3  (three) times daily as needed.     amiodarone (PACERONE) 200 MG tablet TAKE 1 TABLET BY MOUTH EVERY DAY 90 tablet 3   Cholecalciferol (VITAMIN D-3) 25 MCG (1000 UT) CAPS Take 2,000 Units by mouth daily with breakfast.     Choline Fenofibrate (FENOFIBRIC ACID) 135 MG CPDR Take 135 mg by mouth daily.     ELIQUIS 5 MG TABS tablet TAKE 1 TABLET BY MOUTH TWICE A DAY 60 tablet 5   ferrous sulfate 325 (65 FE) MG tablet Take 325 mg by mouth at bedtime.     gabapentin (NEURONTIN) 100 MG capsule Take 100 mg by mouth at bedtime.     levothyroxine (  SYNTHROID, LEVOTHROID) 112 MCG tablet Take 112 mcg by mouth daily.     MAGNESIUM PO Take 250 mg by mouth daily.     omeprazole (PRILOSEC) 40 MG capsule Take 40 mg by mouth daily.     traZODone (DESYREL) 100 MG tablet Take 1 1/2 tablet by mouth (150 mg Total) at bedtime     No current facility-administered medications for this visit.    Allergies:   Patient has no known allergies.   ROS:  Please see the history of present illness.   All other systems are personally reviewed and negative.    Exam:    Vital Signs:  BP 134/80 (BP Location: Left Arm, Patient Position: Standing)   Ht 5\' 3"  (1.6 m)   Wt 155 lb (70.3 kg)   BMI 27.46 kg/m   Doing   Labs/Other Tests and Data Reviewed:    Recent Labs: 07/16/2022: TSH 1.860 09/12/2022: ALT 15; BUN 18; Creatinine, Ser 0.94; Hemoglobin 10.4; Platelets 164; Potassium 3.9; Sodium 138   Wt Readings from Last 3 Encounters:  10/14/22 155 lb (70.3 kg)  09/12/22 153 lb (69.4 kg)  08/05/22 153 lb (69.4 kg)     Other studies personally reviewed: Additional studies/ records that were reviewed today include:  (As above) (See Below)   Loop recorder--demonstrates was atrial fibrillation with rates about 95 but also atrial fibrillation/flutter assoc with heart rates only of 62 and even 50s with flutter like rhythms and post termination pauses-->3 sec   ASSESSMENT & PLAN:    Atrial fibrillation with a rapid rate    Presyncope/syncope -probably orthostatic   Atrial fibrillation pauses/posttermination pauses   Sinus node slowing   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   The patient  concerns regarding her medicines.  The following changes were made today:  none   Labs/ tests ordered today include: CBC  ferrtin/Fe/TIBC No orders of the defined types were placed in this encounter.     Today, I have spent 21 minutes with the patient with telehealth technology discussing the above.  Signed, Sherryl Manges, MD  10/14/2022 12:36 PM     [facility] [address] [place] [address] [phone] (office) [phone] (fax)

## 2022-10-17 ENCOUNTER — Telehealth: Payer: Self-pay | Admitting: *Deleted

## 2022-10-17 NOTE — Telephone Encounter (Signed)
Mrs. Cynthia [name] had called on 10/10 requesting reschedule of her CT scan to 10/25 or 10/26 in afternoon. Rescheduled for 10/25 at 5:30/6:00--left VM to check her Mychart message for details and confirm receipt.

## 2022-10-25 ENCOUNTER — Ambulatory Visit (HOSPITAL_BASED_OUTPATIENT_CLINIC_OR_DEPARTMENT_OTHER): Payer: Medicare PPO

## 2022-10-30 ENCOUNTER — Other Ambulatory Visit (HOSPITAL_BASED_OUTPATIENT_CLINIC_OR_DEPARTMENT_OTHER): Payer: Medicare PPO

## 2022-10-31 ENCOUNTER — Ambulatory Visit (HOSPITAL_BASED_OUTPATIENT_CLINIC_OR_DEPARTMENT_OTHER)
Admission: RE | Admit: 2022-10-31 | Discharge: 2022-10-31 | Disposition: A | Discharge status: Home / Self Care | Payer: Medicare PPO | Source: Ambulatory Visit | Attending: Oncology | Admitting: Oncology

## 2022-10-31 DIAGNOSIS — K439 Ventral hernia without obstruction or gangrene: Secondary | ICD-10-CM | POA: Insufficient documentation

## 2022-10-31 DIAGNOSIS — I700 Atherosclerosis of aorta: Secondary | ICD-10-CM | POA: Insufficient documentation

## 2022-10-31 DIAGNOSIS — C642 Malignant neoplasm of left kidney, except renal pelvis: Secondary | ICD-10-CM | POA: Insufficient documentation

## 2022-10-31 DIAGNOSIS — Z95828 Presence of other vascular implants and grafts: Secondary | ICD-10-CM | POA: Insufficient documentation

## 2022-10-31 DIAGNOSIS — Z9049 Acquired absence of other specified parts of digestive tract: Secondary | ICD-10-CM | POA: Diagnosis not present

## 2022-10-31 DIAGNOSIS — Z905 Acquired absence of kidney: Secondary | ICD-10-CM | POA: Diagnosis not present

## 2022-10-31 DIAGNOSIS — C249 Malignant neoplasm of biliary tract, unspecified: Secondary | ICD-10-CM | POA: Diagnosis not present

## 2022-10-31 DIAGNOSIS — C689 Malignant neoplasm of urinary organ, unspecified: Secondary | ICD-10-CM

## 2022-10-31 DIAGNOSIS — C679 Malignant neoplasm of bladder, unspecified: Secondary | ICD-10-CM | POA: Diagnosis not present

## 2022-11-03 ENCOUNTER — Ambulatory Visit (INDEPENDENT_AMBULATORY_CARE_PROVIDER_SITE_OTHER): Payer: Medicare PPO

## 2022-11-03 DIAGNOSIS — R55 Syncope and collapse: Secondary | ICD-10-CM | POA: Diagnosis not present

## 2022-11-03 LAB — CUP PACEART REMOTE DEVICE CHECK
Date Time Interrogation Session: 20241027231847
Implantable Pulse Generator Implant Date: 20210521

## 2022-11-06 ENCOUNTER — Inpatient Hospital Stay: Payer: Medicare PPO | Attending: Oncology | Admitting: Oncology

## 2022-11-06 VITALS — BP 132/75 | HR 64 | Temp 98.1°F | Resp 18 | Ht 63.0 in | Wt 158.0 lb

## 2022-11-06 DIAGNOSIS — Z923 Personal history of irradiation: Secondary | ICD-10-CM | POA: Insufficient documentation

## 2022-11-06 DIAGNOSIS — Z853 Personal history of malignant neoplasm of breast: Secondary | ICD-10-CM | POA: Diagnosis not present

## 2022-11-06 DIAGNOSIS — C689 Malignant neoplasm of urinary organ, unspecified: Secondary | ICD-10-CM

## 2022-11-06 DIAGNOSIS — Z8551 Personal history of malignant neoplasm of bladder: Secondary | ICD-10-CM | POA: Diagnosis not present

## 2022-11-06 DIAGNOSIS — K589 Irritable bowel syndrome without diarrhea: Secondary | ICD-10-CM | POA: Diagnosis not present

## 2022-11-06 DIAGNOSIS — E041 Nontoxic single thyroid nodule: Secondary | ICD-10-CM | POA: Insufficient documentation

## 2022-11-06 DIAGNOSIS — Z8679 Personal history of other diseases of the circulatory system: Secondary | ICD-10-CM | POA: Diagnosis not present

## 2022-11-06 DIAGNOSIS — E119 Type 2 diabetes mellitus without complications: Secondary | ICD-10-CM | POA: Diagnosis not present

## 2022-11-06 DIAGNOSIS — I4891 Unspecified atrial fibrillation: Secondary | ICD-10-CM | POA: Insufficient documentation

## 2022-11-06 DIAGNOSIS — Q631 Lobulated, fused and horseshoe kidney: Secondary | ICD-10-CM | POA: Insufficient documentation

## 2022-11-06 DIAGNOSIS — K219 Gastro-esophageal reflux disease without esophagitis: Secondary | ICD-10-CM | POA: Diagnosis not present

## 2022-11-06 NOTE — Progress Notes (Signed)
[facility] OFFICE PROGRESS NOTE   Diagnosis: Urothelial carcinoma, breast cancer  INTERVAL HISTORY:   Cynthia [name] returns as scheduled.  She generally feels well.  Good appetite.  She reports fatigue due to caring for her ill husband.  No change over either breast.  She continues cystoscopic surveillance with Dr. Berneice Heinrich  Objective:  Vital signs in last 24 hours:  Blood pressure 132/75, pulse 64, temperature 98.1 F (36.7 C), temperature source Temporal, resp. rate 18, height 5\' 3"  (1.6 m), weight 158 lb (71.7 kg), SpO2 99%.    Lymphatics: No cervical, supraclavicular, axillary, or inguinal nodes Resp: Lungs clear bilaterally Cardio: Regular rate and rhythm GI: No hepatosplenomegaly Vascular: No leg edema Breast: No mass in either breast.  Status post right lumpectomy.  No evidence for local tumor recurrence.  Radiation skin changes/edema of the right breast  Lab Results:  Lab Results  Component Value Date   WBC 5.6 09/12/2022   HGB 10.4 (L) 09/12/2022   HCT 32.2 (L) 09/12/2022   MCV 85.2 09/12/2022   PLT 164 09/12/2022   NEUTROABS 2.9 09/12/2022    CMP  Lab Results  Component Value Date   NA 138 09/12/2022   K 3.9 09/12/2022   CL 101 09/12/2022   CO2 29 09/12/2022   GLUCOSE 79 09/12/2022   BUN 18 09/12/2022   CREATININE 0.94 09/12/2022   CALCIUM 8.6 (L) 09/12/2022   PROT 6.1 (L) 09/12/2022   ALBUMIN 3.7 09/12/2022   AST 30 09/12/2022   ALT 15 09/12/2022   ALKPHOS 33 (L) 09/12/2022   BILITOT 0.6 09/12/2022   GFRNONAA >60 09/12/2022   GFRAA 49 (L) 09/22/2019    No results found for: "CEA1", "CEA", "ZOX096", "CA125"  Lab Results  Component Value Date   INR 1.5 (H) 08/31/2021   LABPROT 17.7 (H) 08/31/2021    Imaging:  CUP PACEART REMOTE DEVICE CHECK  Result Date: 11/03/2022 ILR summary report received. Battery status OK. Normal device function. No new symptom, tachy, or brady episodes. One new AF episode that was 11 hours and 28  minutes. AF burden is 14.2% of the time.  On OAC.  There were 2 previously viewed and reviewed episodes detected.  Monthly summary reports and ROV/PRN ML, CVRS   Medications: I have reviewed the patient's current medications.   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, status post a left nephro ureterectomy 04/27/2019 Tumor extends to the muscularis, pT2, pN0 May 2023-recurrence at the left trigone area on surveillance cystoscopy-TURBT-invasive high-grade papillary urothelial carcinoma invading muscularis propria PET 07/29/2021-prior left nephro ureterectomy and TURBT, no evidence of metastatic disease Neoadjuvant gemcitabine/carboplatin for 4 cycles 07/17/2021-September 2023 PET 10/24/2021-no evidence of hypermetabolic metastatic disease, focal hypermetabolism in the sigmoid colon with associated diverticular wall thickening, hypermetabolic 1.8 cm left thyroid hyperdense nodule-unchanged CTs 10/31/2022-stable changes from left nephro ureterectomy, no evidence of recurrent disease, stable hyperdense thyroid nodule  2.  Congenital left horseshoe kidney 3.  Right breast cancer, status post right lumpectomy and sentinel lymph node biopsy 04/01/2016-pT1bpN0, status post a right lumpectomy, adjuvant radiation, and 5 years of anastrozole (started 07/15/2016)  4.  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  Diabetes 6.  Gastroesophageal reflux disease 7.  IBS 8.  History of  atrial fibrillation/flutter 9.  History of NSVT        Disposition: [name] remains in clinical remission from urothelial carcinoma and breast cancer.  She is scheduled for a  mammogram next month.  She continues cystoscopic surveillance with Dr. Berneice Heinrich.  She will return for an office visit in 6 months.  The thyroid nodule was stable on the CT 10/31/2022.  A Port-A-Cath remains in place.  We will refer her for Port-A-Cath removal.  Thornton Papas, MD  11/06/2022  3:23 PM

## 2022-11-07 ENCOUNTER — Other Ambulatory Visit: Payer: Self-pay | Admitting: *Deleted

## 2022-11-10 IMAGING — DX DG CHEST 2V
2 series · 2 of 2 positions shown · non-contrast
Comparison: Chest two views 03/23/2021

CLINICAL DATA: Atrial fibrillation.

EXAM:
CHEST - 2 VIEW

[chest pa]
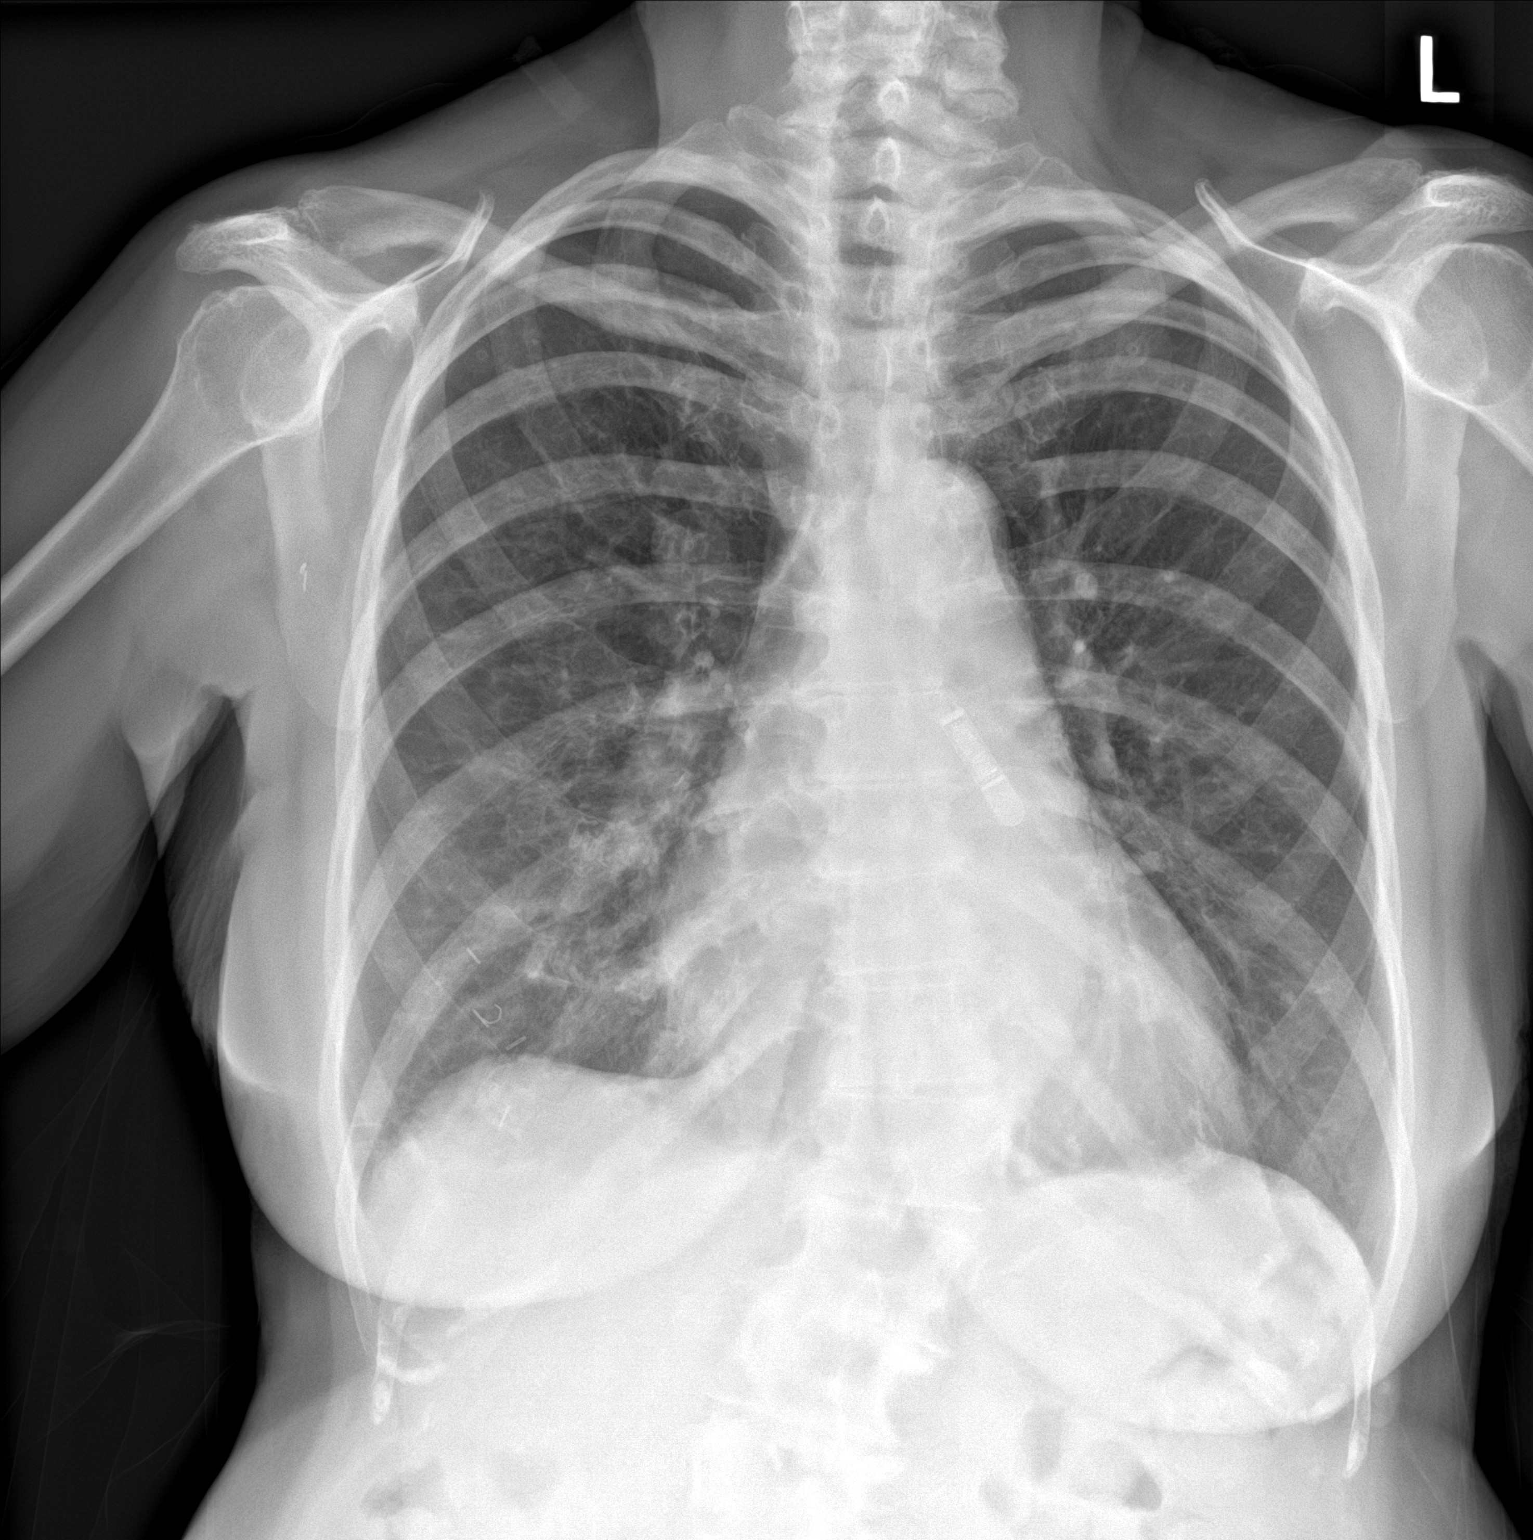

[chest lat]
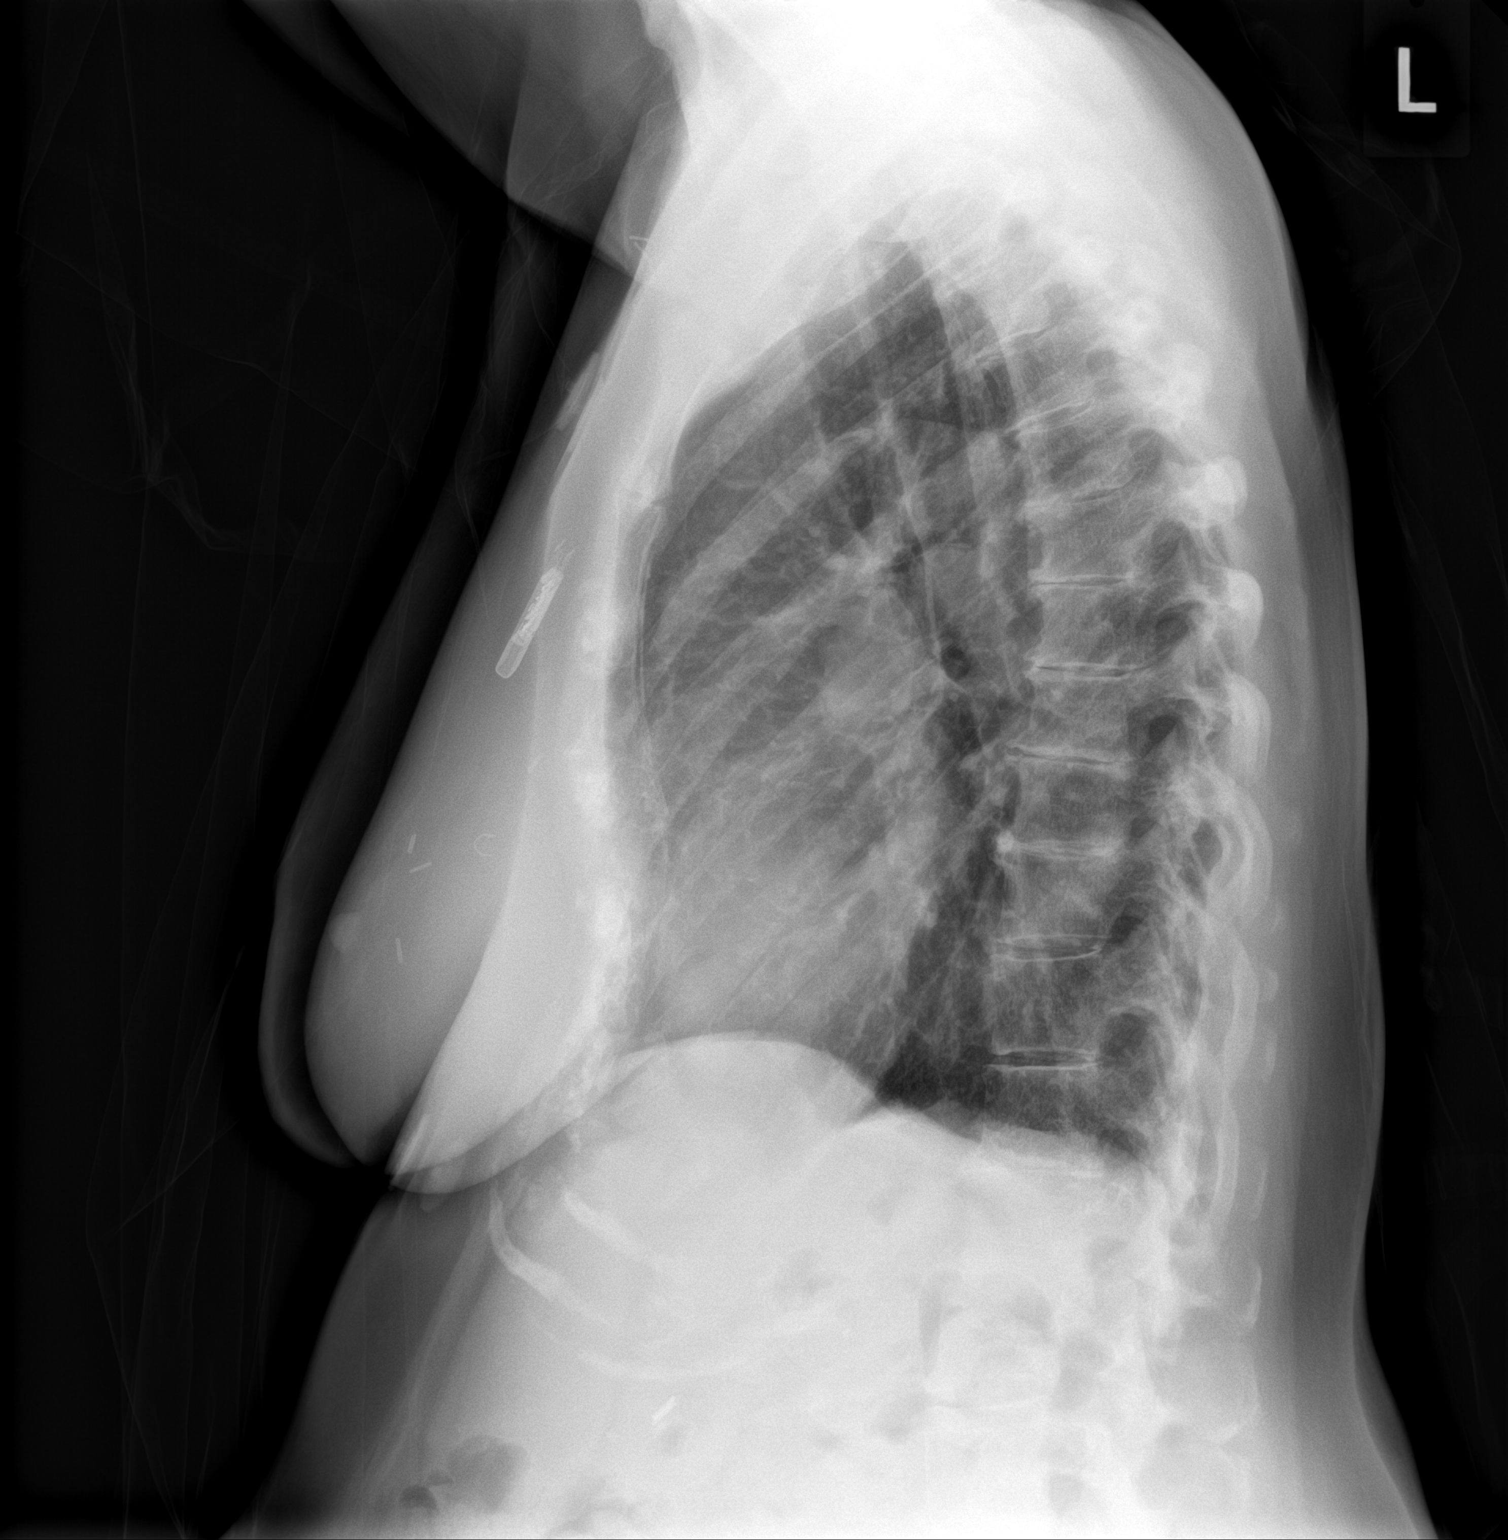

[2 of 2 positions shown; findings below may reference images not displayed]

FINDINGS: Left chest wall cardiac loop recorder is again seen. Cardiac
silhouette is again mildly enlarged. Mediastinal contours are within
normal limits with mild calcification within aortic arch. Surgical
clips overlie the right breast. A few right axillary surgical clips
are again seen. The lungs are clear. No pleural effusion or
pneumothorax. Moderate multilevel degenerative disc changes of the
thoracic spine. Mild to moderate levocurvature of the thoracolumbar
junction with mild dextrocurvature of the upper lumbar spine.
IMPRESSION: No significant change from prior. Mildly enlarged cardiac silhouette
without acute lung process.

## 2022-11-12 ENCOUNTER — Other Ambulatory Visit: Payer: Self-pay | Admitting: Oncology

## 2022-11-12 DIAGNOSIS — C689 Malignant neoplasm of urinary organ, unspecified: Secondary | ICD-10-CM

## 2022-11-12 DIAGNOSIS — Z1231 Encounter for screening mammogram for malignant neoplasm of breast: Secondary | ICD-10-CM | POA: Diagnosis not present

## 2022-11-24 NOTE — Progress Notes (Signed)
[employer] Summary Report / Loop Recorder 

## 2022-11-26 ENCOUNTER — Ambulatory Visit (HOSPITAL_COMMUNITY)
Admission: RE | Admit: 2022-11-26 | Discharge: 2022-11-26 | Disposition: A | Discharge status: Home / Self Care | Payer: Medicare PPO | Source: Ambulatory Visit | Attending: Oncology | Admitting: Oncology

## 2022-11-26 DIAGNOSIS — C689 Malignant neoplasm of urinary organ, unspecified: Secondary | ICD-10-CM | POA: Insufficient documentation

## 2022-11-26 DIAGNOSIS — Z452 Encounter for adjustment and management of vascular access device: Secondary | ICD-10-CM | POA: Diagnosis not present

## 2022-11-26 DIAGNOSIS — C679 Malignant neoplasm of bladder, unspecified: Secondary | ICD-10-CM | POA: Diagnosis not present

## 2022-11-26 HISTORY — PX: IR REMOVAL TUN ACCESS W/ PORT W/O FL MOD SED: IMG2290

## 2022-11-26 MED ORDER — LIDOCAINE-EPINEPHRINE 1 %-1:100000 IJ SOLN
20.0000 mL | Freq: Once | INTRAMUSCULAR | Status: AC
  Administered 2022-11-26: 10 mL via INTRADERMAL

## 2022-11-26 MED ORDER — LIDOCAINE-EPINEPHRINE 1 %-1:100000 IJ SOLN
INTRAMUSCULAR | Status: AC
  Filled 2022-11-26: qty 1

## 2022-12-07 LAB — CUP PACEART REMOTE DEVICE CHECK
Date Time Interrogation Session: 20241130232107
Implantable Pulse Generator Implant Date: 20210521

## 2022-12-08 ENCOUNTER — Ambulatory Visit (INDEPENDENT_AMBULATORY_CARE_PROVIDER_SITE_OTHER): Payer: Medicare PPO

## 2022-12-08 DIAGNOSIS — R55 Syncope and collapse: Secondary | ICD-10-CM

## 2023-01-08 DIAGNOSIS — I4891 Unspecified atrial fibrillation: Secondary | ICD-10-CM | POA: Diagnosis not present

## 2023-01-08 DIAGNOSIS — N1831 Chronic kidney disease, stage 3a: Secondary | ICD-10-CM | POA: Diagnosis not present

## 2023-01-08 DIAGNOSIS — I10 Essential (primary) hypertension: Secondary | ICD-10-CM | POA: Diagnosis not present

## 2023-01-08 DIAGNOSIS — E039 Hypothyroidism, unspecified: Secondary | ICD-10-CM | POA: Diagnosis not present

## 2023-01-08 DIAGNOSIS — R7303 Prediabetes: Secondary | ICD-10-CM | POA: Diagnosis not present

## 2023-01-14 DIAGNOSIS — E1140 Type 2 diabetes mellitus with diabetic neuropathy, unspecified: Secondary | ICD-10-CM | POA: Diagnosis not present

## 2023-01-14 DIAGNOSIS — L84 Corns and callosities: Secondary | ICD-10-CM | POA: Diagnosis not present

## 2023-01-21 NOTE — Progress Notes (Signed)
Electrophysiology Office Note:   Date:  01/22/2023  ID:  Cynthia [name], DOB June 08, 1945, MRN [id number]  Primary Cardiologist: Parke Poisson, MD Primary Heart Failure: None Electrophysiologist: Sherryl Manges, MD      History of Present Illness:   Cynthia [name] is a 78 y.o. 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   Since last being seen in our clinic the patient reports she has had a rough few months. Her husband of 48 years passed away the week of Thanksgiving. She had been under tremendous stress and feels she had several episodes of fast HR's / possible AF. She is diligent about taking her medications as prescribed - no missed doses of eliquis.   She denies chest pain, palpitations, dyspnea, PND, orthopnea, nausea, vomiting, dizziness, syncope, edema, weight gain, or early satiety.   Review of systems complete and found to be negative unless listed in HPI.   EP Information / Studies Reviewed:    EKG is ordered today. Personal review as below.  EKG Interpretation Date/Time:  Thursday January 22 2023 15:07:21 EST Ventricular Rate:  51 PR Interval:  240 QRS Duration:  90 QT Interval:  466 QTC Calculation: 429 R Axis:   51  Text Interpretation: Sinus bradycardia with 1st degree A-V block Confirmed by Canary Brim ([id number]) on 01/22/2023 3:12:36 PM   Studies:  cMRI 12/2018 > LVEF 52% ECHO 02/2022 > 60-65%    Arrhythmia / AAD AF  VT Amiodarone > started 2020 for AF, VT   Device: MDT ILR  Risk Assessment/Calculations:    CHA2DS2-VASc Score = 6   This indicates a 9.7% annual risk of stroke. The patient's score is based upon: CHF History: 0 HTN History: 1 Diabetes History: 1 Stroke History: 0 Vascular Disease History: 1 Age Score: 2 Gender Score: 1             Physical Exam:   VS:  BP 130/62   Pulse (!) 51   Resp (!) 95   Ht 5\' 3"  (1.6 m)   Wt 163 lb  (73.9 kg)   BMI 28.87 kg/m    Wt Readings from Last 3 Encounters:  01/22/23 163 lb (73.9 kg)  11/06/22 158 lb (71.7 kg)  10/14/22 155 lb (70.3 kg)     GEN: Well nourished, well developed in no acute distress NECK: No JVD; No carotid bruits CARDIAC: Regular rate and rhythm, no murmurs, rubs, gallops RESPIRATORY:  Clear to auscultation without rales, wheezing or rhonchi  ABDOMEN: Soft, non-tender, non-distended EXTREMITIES:  No edema; No deformity   ASSESSMENT AND PLAN:    Paroxysmal Atrial Fibrillation  AFL  Post Termination Pauses / Syncope CHA2DS2-VASc 6. TSH wnl 07/2022.  -MDT ILR review shows no episodes of AF since 10/19 based on criteria. Battery is at EOS. Since June 2024, she 1 AF episode in October that lasted 11h, 28 min, and 5 post conversion pauses  -continue amiodarone 200mg  daily  -EKG with SB, limits addition of BB  -reviewed with patient, given her recent life stressors, & she feels her rhythm has settled down will hold on making adjustments to her regimen and revisit with her in one month   Secondary Hypercoagulable State  -continue Eliquis, dose reviewed and appropriate by age/wt  WCT / Fasicular VT -hx of, monitor   Hypertension  -well controlled on current regimen    Follow up with Dr. Graciela Husbands or EP APP in 4  weeks  Signed, Canary Brim, NP-C, AGACNP-BC [facility] - Electrophysiology  01/22/2023, 3:54 PM

## 2023-01-22 ENCOUNTER — Ambulatory Visit: Payer: Medicare PPO | Attending: Pulmonary Disease | Admitting: Pulmonary Disease

## 2023-01-22 ENCOUNTER — Encounter: Payer: Self-pay | Admitting: Pulmonary Disease

## 2023-01-22 VITALS — BP 130/62 | HR 51 | Resp 95 | Ht 63.0 in | Wt 163.0 lb

## 2023-01-22 DIAGNOSIS — D6869 Other thrombophilia: Secondary | ICD-10-CM

## 2023-01-22 DIAGNOSIS — R000 Tachycardia, unspecified: Secondary | ICD-10-CM | POA: Diagnosis not present

## 2023-01-22 DIAGNOSIS — I480 Paroxysmal atrial fibrillation: Secondary | ICD-10-CM | POA: Diagnosis not present

## 2023-01-22 DIAGNOSIS — I10 Essential (primary) hypertension: Secondary | ICD-10-CM

## 2023-01-22 LAB — CUP PACEART INCLINIC DEVICE CHECK
Date Time Interrogation Session: 20250116155816
Implantable Pulse Generator Implant Date: 20210521

## 2023-01-22 NOTE — Patient Instructions (Addendum)
Medication Instructions:  Your physician recommends that you continue on your current medications as directed. Please refer to the Current Medication list given to you today.  *If you need a refill on your cardiac medications before your next appointment, please call your pharmacy*  Lab Work: None ordered If you have labs (blood work) drawn today and your tests are completely normal, you will receive your results only by: MyChart Message (if you have MyChart) OR A paper copy in the mail If you have any lab test that is abnormal or we need to change your treatment, we will call you to review the results.  Follow-Up: At [facility], you and your health needs are our priority.  As part of our continuing mission to provide you with exceptional heart care, we have created designated Provider Care Teams.  These Care Teams include your primary Cardiologist (physician) and Advanced Practice Providers (APPs -  Physician Assistants and Nurse Practitioners) who all work together to provide you with the care you need, when you need it.  Your next appointment:   1 month(s)  Provider:   Sherryl Manges, MD or Canary Brim, NP

## 2023-02-17 DIAGNOSIS — Z905 Acquired absence of kidney: Secondary | ICD-10-CM | POA: Diagnosis not present

## 2023-02-17 DIAGNOSIS — C678 Malignant neoplasm of overlapping sites of bladder: Secondary | ICD-10-CM | POA: Diagnosis not present

## 2023-02-17 DIAGNOSIS — C652 Malignant neoplasm of left renal pelvis: Secondary | ICD-10-CM | POA: Diagnosis not present

## 2023-02-20 DIAGNOSIS — N390 Urinary tract infection, site not specified: Secondary | ICD-10-CM | POA: Diagnosis not present

## 2023-02-20 DIAGNOSIS — R399 Unspecified symptoms and signs involving the genitourinary system: Secondary | ICD-10-CM | POA: Diagnosis not present

## 2023-03-01 NOTE — Progress Notes (Unsigned)
  Electrophysiology Office Note:   Date:  03/02/2023  ID:  Raseel, Jans 1945/01/17, MRN [id number]  Primary Cardiologist: Parke Poisson, MD Primary Heart Failure: None Electrophysiologist: Sherryl Manges, MD      History of Present Illness:   Cynthia [name] is a 78 y.o. 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   Since last being seen in our clinic the patient reports doing much better.  She notes she is feeling better emotionally after the death of her husband. / learning to find a new normal. She is not aware of her AF.  Asks about her cardiac monitor battery.   She denies chest pain, palpitations, dyspnea, PND, orthopnea, nausea, vomiting, dizziness, syncope, edema, weight gain, or early satiety.   Review of systems complete and found to be negative unless listed in HPI.   EP Information / Studies Reviewed:    EKG is not ordered today. EKG from 01/22/23 reviewed which showed SB 51 bpm with 1st degree AV block      Studies:  cMRI 12/2018 > LVEF 52% ECHO 02/2022 > 60-65%      Arrhythmia / AAD AF  VT Amiodarone > started 2020 for AF, VT    Device: MDT ILR  ILR Review 01/2023 > no episodes of AF since 10/19 based on criteria. Battery is at EOS. Since June 2024, she 1 AF episode in October that lasted 11h, 28 min, and 5 post conversion pauses   Risk Assessment/Calculations:    CHA2DS2-VASc Score = 6   This indicates a 9.7% annual risk of stroke. The patient's score is based upon: CHF History: 0 HTN History: 1 Diabetes History: 1 Stroke History: 0 Vascular Disease History: 1 Age Score: 2 Gender Score: 1             Physical Exam:   VS:  BP 122/60   Pulse 88   Ht 5\' 3"  (1.6 m)   Wt 163 lb 6.4 oz (74.1 kg)   SpO2 96%   BMI 28.95 kg/m    Wt Readings from Last 3 Encounters:  03/02/23 163 lb 6.4 oz (74.1 kg)  01/22/23 163 lb (73.9 kg)  11/06/22 158 lb  (71.7 kg)     GEN: Well nourished, well developed in no acute distress NECK: No JVD; No carotid bruits CARDIAC: Regular rate and rhythm, no murmurs, rubs, gallops RESPIRATORY:  Clear to auscultation without rales, wheezing or rhonchi  ABDOMEN: Soft, non-tender, non-distended EXTREMITIES:  No edema; No deformity   ASSESSMENT AND PLAN:    Paroxysmal Atrial Fibrillation  AFL  Post Termination Pauses / Syncope CHA2DS2-VASc 6. TSH wnl 07/2022.  -discussed with Dr. Graciela Husbands, reduce amiodarone 200 mg daily to 5 days per week -symptom burden improved  / minimal -ILR review shows one episode of AF since 12/02/22 lasting 9 hours 32 minutes.  Max V rate 143 bpm, median 103 bpm, one episode of 3 second post conversion pause -consider amiodarone labs at next visit    Secondary Hypercoagulable State  -continue Eliquis, dose reviewed and appropriate by wt / cr   WCT / Fasicular VT -hx of, monitor    Hypertension  -well controlled on current regimen      Follow up with Dr. Graciela Husbands in 6 months  Signed, Canary Brim, NP-C, AGACNP-BC [facility] - Electrophysiology  03/02/2023, 2:02 PM

## 2023-03-02 ENCOUNTER — Encounter: Payer: Self-pay | Admitting: Pulmonary Disease

## 2023-03-02 ENCOUNTER — Ambulatory Visit: Payer: Medicare PPO | Attending: Pulmonary Disease | Admitting: Pulmonary Disease

## 2023-03-02 VITALS — BP 122/60 | HR 88 | Ht 63.0 in | Wt 163.4 lb

## 2023-03-02 DIAGNOSIS — D6869 Other thrombophilia: Secondary | ICD-10-CM

## 2023-03-02 DIAGNOSIS — I480 Paroxysmal atrial fibrillation: Secondary | ICD-10-CM | POA: Diagnosis not present

## 2023-03-02 DIAGNOSIS — I4729 Other ventricular tachycardia: Secondary | ICD-10-CM | POA: Diagnosis not present

## 2023-03-02 DIAGNOSIS — R55 Syncope and collapse: Secondary | ICD-10-CM | POA: Diagnosis not present

## 2023-03-02 MED ORDER — AMIODARONE HCL 200 MG PO TABS: ORAL_TABLET | ORAL | Status: DC

## 2023-03-02 NOTE — Patient Instructions (Signed)
 Medication Instructions:  Decrease amiodarone 200 mg Monday-Friday only, do not take any on Saturday or Sunday. *If you need a refill on your cardiac medications before your next appointment, please call your pharmacy*  Lab Work: None ordered If you have labs (blood work) drawn today and your tests are completely normal, you will receive your results only by: MyChart Message (if you have MyChart) OR A paper copy in the mail If you have any lab test that is abnormal or we need to change your treatment, we will call you to review the results.  Follow-Up: At [facility], you and your health needs are our priority.  As part of our continuing mission to provide you with exceptional heart care, we have created designated Provider Care Teams.  These Care Teams include your primary Cardiologist (physician) and Advanced Practice Providers (APPs -  Physician Assistants and Nurse Practitioners) who all work together to provide you with the care you need, when you need it.  Your next appointment:   6 month(s)  Provider:   Sherryl Manges, MD or Canary Brim, NP

## 2023-03-17 DIAGNOSIS — R509 Fever, unspecified: Secondary | ICD-10-CM | POA: Diagnosis not present

## 2023-03-17 DIAGNOSIS — R399 Unspecified symptoms and signs involving the genitourinary system: Secondary | ICD-10-CM | POA: Diagnosis not present

## 2023-03-17 DIAGNOSIS — F331 Major depressive disorder, recurrent, moderate: Secondary | ICD-10-CM | POA: Diagnosis not present

## 2023-03-19 ENCOUNTER — Telehealth: Payer: Self-pay | Admitting: Internal Medicine

## 2023-03-19 NOTE — Telephone Encounter (Signed)
   Pre-operative Risk Assessment    Patient Name: Cynthia [name]  DOB: 03/12/1945 MRN: [id number]   Date of last office visit: 03/02/23  Date of next office visit: due August 2025   Request for Surgical Clearance    Procedure:  Dental Extraction - Amount of Teeth to be Pulled:  1  Date of Surgery:  Clearance 04/15/23                                 Surgeon:  Dr. Carlean Jews  Surgeon's Group or Practice Name:  [facility]  Phone number:  [phone]  Fax number:  [phone]   Type of Clearance Requested:   - Medical  - Pharmacy:  Hold Apixaban (Eliquis)     Type of Anesthesia:  Local    Additional requests/questions:    Alben Spittle   03/19/2023, 3:21 PM

## 2023-03-20 DIAGNOSIS — R509 Fever, unspecified: Secondary | ICD-10-CM | POA: Diagnosis not present

## 2023-03-20 DIAGNOSIS — N1831 Chronic kidney disease, stage 3a: Secondary | ICD-10-CM | POA: Diagnosis not present

## 2023-03-20 DIAGNOSIS — Z87440 Personal history of urinary (tract) infections: Secondary | ICD-10-CM | POA: Diagnosis not present

## 2023-03-20 NOTE — Telephone Encounter (Signed)
   Patient Name: Cynthia [name]  DOB: [birth year] MRN: [id number]  Primary Cardiologist: Parke Poisson, MD  Chart reviewed as part of pre-operative protocol coverage.   Simple dental extractions (i.e. 1-2 teeth) are considered low risk procedures per guidelines and generally do not require any specific cardiac clearance. It is also generally accepted that for simple extractions and dental cleanings, there is no need to interrupt blood thinner therapy.  SBE prophylaxis is not required for the patient from a cardiac standpoint.  I will route this recommendation to the requesting party via Epic fax function and remove from pre-op pool.  Please call with questions.  Joylene Grapes, NP 03/20/2023, 8:44 AM

## 2023-03-23 ENCOUNTER — Ambulatory Visit: Payer: Medicare PPO

## 2023-03-24 LAB — CUP PACEART REMOTE DEVICE CHECK
Date Time Interrogation Session: 20250317002709
Implantable Pulse Generator Implant Date: 20210521

## 2023-04-07 ENCOUNTER — Telehealth: Payer: Self-pay | Admitting: Internal Medicine

## 2023-04-07 NOTE — Telephone Encounter (Signed)
 Called patient back about message. Patient stated she has no energy on the weekends when she does not take the Amiodarone, so patient started taking Amiodarone again on the weekends. Patient stated she would stop taking it on the weekends if she truly needs to do so. Patient stated she enjoys going to church on Sunday evenings and it is a 45 minute drive and she just cannot do that if she does not take her Amiodarone. Will forward to Dr. Graciela Husbands and his nurse.

## 2023-04-07 NOTE — Telephone Encounter (Signed)
 Pt c/o medication issue:  1. Name of Medication:   amiodarone (PACERONE) 200 MG tablet   2. How are you currently taking this medication (dosage and times per day)?   As prescribed  3. Are you having a reaction (difficulty breathing--STAT)?   4. What is your medication issue?   Patient stated she had been taking the medication on Monday through Friday and prescribed but for the couple of week ends patient stated she has been feeling "listless" without the medication and has started taking the medication 7 days a week.  Patient wants to confirm this medication change would be OK. Patient stated can leave voice message.

## 2023-04-09 DIAGNOSIS — R8290 Unspecified abnormal findings in urine: Secondary | ICD-10-CM | POA: Diagnosis not present

## 2023-04-09 DIAGNOSIS — H00015 Hordeolum externum left lower eyelid: Secondary | ICD-10-CM | POA: Diagnosis not present

## 2023-04-10 ENCOUNTER — Other Ambulatory Visit: Payer: Self-pay | Admitting: Internal Medicine

## 2023-04-10 DIAGNOSIS — I4891 Unspecified atrial fibrillation: Secondary | ICD-10-CM

## 2023-04-10 MED ORDER — AMIODARONE HCL 200 MG PO TABS: 200.0000 mg | ORAL_TABLET | Freq: Every day | ORAL | Status: DC

## 2023-04-10 NOTE — Telephone Encounter (Signed)
 Attempted phone call to pt.  No VM message left with this call.  My Chart message left.

## 2023-04-10 NOTE — Telephone Encounter (Signed)
 Attempted phone call to pt and left voicemail message top contact RN at [phone].

## 2023-04-10 NOTE — Addendum Note (Signed)
 Addended by: Alois Cliche on: 04/10/2023 02:31 PM   Modules accepted: Orders

## 2023-04-13 NOTE — Telephone Encounter (Signed)
 Prescription refill request for Eliquis received. Indication:afib Last office visit:2/25 Scr:0.94  9/24 Age: 78 Weight:74.1  kg  Prescription refilled

## 2023-04-20 ENCOUNTER — Encounter: Payer: Self-pay | Admitting: Internal Medicine

## 2023-04-28 ENCOUNTER — Telehealth: Payer: Self-pay

## 2023-04-28 DIAGNOSIS — H1045 Other chronic allergic conjunctivitis: Secondary | ICD-10-CM | POA: Diagnosis not present

## 2023-04-28 DIAGNOSIS — H16142 Punctate keratitis, left eye: Secondary | ICD-10-CM | POA: Diagnosis not present

## 2023-04-28 NOTE — Telephone Encounter (Signed)
 Returning a phone call in regards to patient being concerned about symptoms that started on Saturday, having a sudden chill, cold natured now, temperature 100.1, staying in bed all day and "Are this symptoms from previous Chemotherapy almost 2 years ago?" Made patient aware that her symptoms are not from Chemotherapy since it has been so long ago. Also on Eliquis  which can make you very cold due to being a blood thinner.Voiced to patient that symptoms could be a viral infection if been around a lot of people. Continue to drink plenty of fluids, rest if possible. If symptoms persist,go to primary doctor to get tested for the flu, COVID, etc since there is a lot of Upper Respiratory Infections going around. Patient also stated taking Tylenol  to reduce low grade fevers and will start taking a multivitamin to increase immune system. Understood, no further questions.

## 2023-05-07 ENCOUNTER — Ambulatory Visit: Payer: Medicare PPO | Admitting: Oncology

## 2023-05-11 DIAGNOSIS — N1831 Chronic kidney disease, stage 3a: Secondary | ICD-10-CM | POA: Diagnosis not present

## 2023-05-11 DIAGNOSIS — F419 Anxiety disorder, unspecified: Secondary | ICD-10-CM | POA: Diagnosis not present

## 2023-05-11 DIAGNOSIS — F5104 Psychophysiologic insomnia: Secondary | ICD-10-CM | POA: Diagnosis not present

## 2023-05-11 DIAGNOSIS — R509 Fever, unspecified: Secondary | ICD-10-CM | POA: Diagnosis not present

## 2023-05-11 DIAGNOSIS — L299 Pruritus, unspecified: Secondary | ICD-10-CM | POA: Diagnosis not present

## 2023-05-11 DIAGNOSIS — L989 Disorder of the skin and subcutaneous tissue, unspecified: Secondary | ICD-10-CM | POA: Diagnosis not present

## 2023-05-11 DIAGNOSIS — R350 Frequency of micturition: Secondary | ICD-10-CM | POA: Diagnosis not present

## 2023-05-16 DIAGNOSIS — N399 Disorder of urinary system, unspecified: Secondary | ICD-10-CM | POA: Diagnosis not present

## 2023-05-21 ENCOUNTER — Ambulatory Visit: Payer: Medicare PPO | Admitting: Oncology

## 2023-05-28 ENCOUNTER — Inpatient Hospital Stay: Payer: Medicare PPO | Attending: Oncology | Admitting: Oncology

## 2023-05-28 VITALS — BP 136/65 | HR 60 | Temp 98.1°F | Resp 18 | Ht 63.0 in | Wt 165.0 lb

## 2023-05-28 DIAGNOSIS — Z8553 Personal history of malignant neoplasm of renal pelvis: Secondary | ICD-10-CM | POA: Insufficient documentation

## 2023-05-28 DIAGNOSIS — Z853 Personal history of malignant neoplasm of breast: Secondary | ICD-10-CM | POA: Insufficient documentation

## 2023-05-28 DIAGNOSIS — C689 Malignant neoplasm of urinary organ, unspecified: Secondary | ICD-10-CM | POA: Diagnosis not present

## 2023-05-28 NOTE — Progress Notes (Signed)
 Sent staff message to Dr. Alen Husbands at Dr. Enedina Harrow request to inquire if she can be seen sooner than 7/16 re: lesion on her face.

## 2023-05-28 NOTE — Progress Notes (Signed)
 [facility] OFFICE PROGRESS NOTE   Diagnosis: Urothelial cancer, breast cancer  INTERVAL HISTORY:   Cynthia [name] returns as scheduled.  She generally feels well.  No hematuria.  Good appetite.  She continues yearly mammography at [facility].  She has noted an ulcerated skin lesion on the right side of her face for the past year.  The lesion scabs over her midline.  Objective:  Vital signs in last 24 hours:  Blood pressure 136/65, pulse 60, temperature 98.1 F (36.7 C), temperature source Temporal, resp. rate 18, height 5\' 3"  (1.6 m), weight 165 lb (74.8 kg), SpO2 98%.     Lymphatics: No cervical, supraclavicular, axillary, or inguinal nodes Resp: Scattered end inspiratory rhonchi, no respiratory distress Cardio: Regular rhythm, 2/6 systolic murmur GI: No hepatosplenomegaly, reducible right ventral hernia, no mass Vascular: No leg edema  Skin: Erythematous nodular ulcerated lesion measuring 1-2 cm at the right malar region Breast: Status post right lumpectomy.  No evidence for local tumor recurrence.  Radiation skin changes of the right breast.  Left breast without mass.   Lab Results:  Lab Results  Component Value Date   WBC 5.6 09/12/2022   HGB 10.4 (L) 09/12/2022   HCT 32.2 (L) 09/12/2022   MCV 85.2 09/12/2022   PLT 164 09/12/2022   NEUTROABS 2.9 09/12/2022    CMP  Lab Results  Component Value Date   NA 138 09/12/2022   K 3.9 09/12/2022   CL 101 09/12/2022   CO2 29 09/12/2022   GLUCOSE 79 09/12/2022   BUN 18 09/12/2022   CREATININE 0.94 09/12/2022   CALCIUM  8.6 (L) 09/12/2022   PROT 6.1 (L) 09/12/2022   ALBUMIN  3.7 09/12/2022   AST 30 09/12/2022   ALT 15 09/12/2022   ALKPHOS 33 (L) 09/12/2022   BILITOT 0.6 09/12/2022   GFRNONAA >60 09/12/2022   GFRAA 49 (L) 09/22/2019     Medications: I have reviewed the patient's current medications.   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, status post a left nephro  ureterectomy 04/27/2019 Tumor extends to the muscularis, pT2, pN0 May 2023-recurrence at the left trigone area on surveillance cystoscopy-TURBT-invasive high-grade papillary urothelial carcinoma invading muscularis propria PET 07/29/2021-prior left nephro ureterectomy and TURBT, no evidence of metastatic disease Neoadjuvant gemcitabine /carboplatin  for 4 cycles 07/17/2021-September 2023 PET 10/24/2021-no evidence of hypermetabolic metastatic disease, focal hypermetabolism in the sigmoid colon with associated diverticular wall thickening, hypermetabolic 1.8 cm left thyroid  hyperdense nodule-unchanged CTs 10/31/2022-stable changes from left nephro ureterectomy, no evidence of recurrent disease, stable hyperdense thyroid  nodule  2.  Congenital left horseshoe kidney 3.  Right breast cancer, status post right lumpectomy and sentinel lymph node biopsy 04/01/2016-pT1bpN0, status post a right lumpectomy, adjuvant radiation, and 5 years of anastrozole  (started 07/15/2016)  4.  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  Diabetes 6.  Gastroesophageal reflux disease 7.  IBS 8.  History of atrial fibrillation/flutter 9.  History of NSVT    Disposition: Cynthia [name] is in clinical remission from breast cancer and urothelial carcinoma.  She continues yearly mammography.  She is scheduled for a surveillance visit with Dr. Secundino Dach within the next few months.  She had negative surveillance imaging in October 2024, approximately 3.5 years out from the left nephrectomy.  I did not order additional surveillance imaging.  She will continue yearly mammography.  She appears to have a basal cell carcinoma at the  right face.  She has been referred to dermatology.  We will contact dermatology to try and expedite the appointment.  Cynthia [name] will return for an office visit in 6 months.  Coni Deep, MD  05/28/2023  3:39 PM

## 2023-05-29 ENCOUNTER — Telehealth: Payer: Self-pay | Admitting: Oncology

## 2023-05-29 NOTE — Telephone Encounter (Signed)
 Patient has been scheduled for follow-up visit per 05/27/23 LOS.  LVM notifying pt of appt details, provided my direct number to pt if appt changes need to be made.

## 2023-05-31 ENCOUNTER — Other Ambulatory Visit: Payer: Self-pay

## 2023-06-02 DIAGNOSIS — C678 Malignant neoplasm of overlapping sites of bladder: Secondary | ICD-10-CM | POA: Diagnosis not present

## 2023-06-03 ENCOUNTER — Ambulatory Visit: Admitting: Dermatology

## 2023-06-08 ENCOUNTER — Ambulatory Visit: Admitting: Dermatology

## 2023-06-08 ENCOUNTER — Other Ambulatory Visit: Payer: Self-pay | Admitting: Dermatology

## 2023-06-08 ENCOUNTER — Encounter: Payer: Self-pay | Admitting: Dermatology

## 2023-06-08 DIAGNOSIS — L820 Inflamed seborrheic keratosis: Secondary | ICD-10-CM | POA: Diagnosis not present

## 2023-06-08 DIAGNOSIS — D489 Neoplasm of uncertain behavior, unspecified: Secondary | ICD-10-CM

## 2023-06-08 DIAGNOSIS — C44329 Squamous cell carcinoma of skin of other parts of face: Secondary | ICD-10-CM | POA: Diagnosis not present

## 2023-06-08 DIAGNOSIS — D492 Neoplasm of unspecified behavior of bone, soft tissue, and skin: Secondary | ICD-10-CM | POA: Diagnosis not present

## 2023-06-08 DIAGNOSIS — C4492 Squamous cell carcinoma of skin, unspecified: Secondary | ICD-10-CM

## 2023-06-08 HISTORY — DX: Squamous cell carcinoma of skin, unspecified: C44.92

## 2023-06-08 NOTE — Progress Notes (Signed)
   Follow-Up Visit   Subjective  Cynthia [name] is a 78 y.o. female who presents for the following:  Spot on the cheek has been there for one year. Denies any treatment. Sore. Patient is uncertain about how long the spot on the back has been there.   Denies personal or family history of skin cancer.  Denies care of a dermatologist previously.  Currently undergoing treatment for high grade papillary urothelial carcinoma of the renal pelvis, s/p left nephro ureterectomy  The patient has spots, moles and lesions to be evaluated, some may be new or changing.  The following portions of the chart were reviewed this encounter and updated as appropriate: medications, allergies, medical history  Review of Systems:  No other skin or systemic complaints except as noted in HPI or Assessment and Plan.  Objective  Well appearing patient in no apparent distress; mood and affect are within normal limits.  A focused examination was performed of the following areas: Face and back  Relevant exam findings are noted in the Assessment and Plan.  Right Buccal Cheek 1.0 x 1.5 cm pink pearly plaque with rolled borders  Left Lower Back Inflamed stuck on papule  Assessment & Plan   NEOPLASM OF UNCERTAIN BEHAVIOR Right Buccal Cheek Skin / nail biopsy Type of biopsy: tangential   Informed consent: discussed and consent obtained   Timeout: patient name, date of birth, surgical site, and procedure verified   Procedure prep:  Patient was prepped and draped in usual sterile fashion Prep type:  Isopropyl alcohol Anesthesia: the lesion was anesthetized in a standard fashion   Anesthetic:  1% lidocaine  w/ epinephrine  1-100,000 buffered w/ 8.4% NaHCO3 Instrument used: DermaBlade   Hemostasis achieved with: electrodesiccation   Outcome: patient tolerated procedure well   Post-procedure details: sterile dressing applied and wound care instructions given   Dressing type: petrolatum and bandage   Specimen 1 -  Surgical pathology Differential Diagnosis: r/o NMSC vs other  Check Margins: No INFLAMED SEBORRHEIC KERATOSIS Left Lower Back Destruction of lesion - Left Lower Back Complexity: simple   Destruction method: cryotherapy   Lesion destroyed using liquid nitrogen: Yes   Region frozen until ice ball extended beyond lesion: Yes   Cryotherapy cycles:  1 Outcome: patient tolerated procedure well with no complications   Post-procedure details: wound care instructions given    Return pending pathology results.Maurine Sovereign, RN, am acting as scribe for Deneise Finlay, MD .   Documentation: I have reviewed the above documentation for accuracy and completeness, and I agree with the above.  Deneise Finlay, MD

## 2023-06-08 NOTE — Patient Instructions (Signed)

## 2023-06-09 ENCOUNTER — Encounter: Payer: Self-pay | Admitting: Pulmonary Disease

## 2023-06-09 ENCOUNTER — Ambulatory Visit: Attending: Pulmonary Disease | Admitting: Pulmonary Disease

## 2023-06-09 VITALS — BP 116/78 | HR 84 | Ht 63.0 in | Wt 160.6 lb

## 2023-06-09 DIAGNOSIS — D6869 Other thrombophilia: Secondary | ICD-10-CM

## 2023-06-09 DIAGNOSIS — I480 Paroxysmal atrial fibrillation: Secondary | ICD-10-CM | POA: Diagnosis not present

## 2023-06-09 DIAGNOSIS — I10 Essential (primary) hypertension: Secondary | ICD-10-CM | POA: Diagnosis not present

## 2023-06-09 DIAGNOSIS — I4729 Other ventricular tachycardia: Secondary | ICD-10-CM | POA: Diagnosis not present

## 2023-06-09 DIAGNOSIS — Z5181 Encounter for therapeutic drug level monitoring: Secondary | ICD-10-CM

## 2023-06-09 NOTE — Patient Instructions (Signed)
 Medication Instructions:    Your physician recommends that you continue on your current medications as directed. Please refer to the Current Medication list given to you today.   *If you need a refill on your cardiac medications before your next appointment, please call your pharmacy*   Lab Work: NONE ORDERED  TODAY    If you have labs (blood work) drawn today and your tests are completely normal, you will receive your results only by: MyChart Message (if you have MyChart) OR A paper copy in the mail If you have any lab test that is abnormal or we need to change your treatment, we will call you to review the results.    Testing/Procedures:  NONE ORDERED  TODAY     Follow-Up:  At [facility], you and your health needs are our priority.  As part of our continuing mission to provide you with exceptional heart care, our providers are all part of one team.  This team includes your primary Cardiologist (physician) and Advanced Practice Providers or APPs (Physician Assistants and Nurse Practitioners) who all work together to provide you with the care you need, when you need it.   Your next appointment:    2 month(s)  Provider:    You may see Cynthia Chandler, MD or one of the following Advanced Practice Providers on your designated Care Team:   Mertha Abrahams, Kennard Pea "Jonelle Neri" Scarbro, PA-C Suzann Riddle, NP Creighton Doffing, NP     We recommend signing up for the patient portal called "MyChart".  Sign up information is provided on this After Visit Summary.  MyChart is used to connect with patients for Virtual Visits (Telemedicine).  Patients are able to view lab/test results, encounter notes, upcoming appointments, etc.  Non-urgent messages can be sent to your provider as well.   To learn more about what you can do with MyChart, go to [email or web address].   Other Instructions

## 2023-06-09 NOTE — Progress Notes (Signed)
 Electrophysiology Office Note:   Date:  06/09/2023  ID:  Cynthia [name], DOB [birth year], MRN [id number]  Primary Cardiologist: Euell Herrlich, MD Primary Heart Failure: None Electrophysiologist: Richardo Chandler, MD      History of Present Illness:   Cynthia [name] is a 78 y.o. 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   Since last being seen in our clinic the patient reports in the last month she feels she has had shortness of breath the first few hours of the day and then she gets "going around 3pm".  She feels fatigued.  Her loop recorder battery is at EOS / no longer able to record.   She denies chest pain, palpitations, dyspnea, PND, orthopnea, nausea, vomiting, dizziness, syncope, edema, weight gain, or early satiety.   Review of systems complete and found to be negative unless listed in HPI.   EP Information / Studies Reviewed:    EKG is ordered today. Personal review as below.  EKG Interpretation Date/Time:  Tuesday June 09 2023 15:41:26 EDT Ventricular Rate:  84 PR Interval:    QRS Duration:  86 QT Interval:  374 QTC Calculation: 441 R Axis:   59  Text Interpretation: Atrial fibrillation Confirmed by Creighton Doffing ([id number]) on 06/09/2023 4:37:15 PM   Studies:  cMRI 12/2018 > LVEF 52% ECHO 02/2022 > 60-65%      Arrhythmia / AAD AF  VT Amiodarone  > started 2020 for AF, VT    Device: MDT ILR  ILR Review 01/2023 > no episodes of AF since 10/19 based on criteria. Battery is at EOS. Since June 2024, she 1 AF episode in October that lasted 11h, 28 min, and 5 post conversion pauses  ILR Review 03/23/23 > at EOS, presenting AF lasting 9.5 hours, total AF burden 29.1%, 1 3 second pause consistent with post AF conversion pause  Risk Assessment/Calculations:    CHA2DS2-VASc Score = 6   This indicates a 9.7% annual risk of stroke. The patient's score is based upon: CHF  History: 0 HTN History: 1 Diabetes History: 1 Stroke History: 0 Vascular Disease History: 1 Age Score: 2 Gender Score: 1             Physical Exam:   VS:  BP 116/78   Pulse 84   Ht 5\' 3"  (1.6 m)   Wt 160 lb 9.6 oz (72.8 kg)   SpO2 96%   BMI 28.45 kg/m    Wt Readings from Last 3 Encounters:  06/09/23 160 lb 9.6 oz (72.8 kg)  05/28/23 165 lb (74.8 kg)  03/02/23 163 lb 6.4 oz (74.1 kg)     GEN: Well nourished, well developed in no acute distress NECK: No JVD; No carotid bruits CARDIAC: Irregularly irregular rate and rhythm, no murmurs, rubs, gallops RESPIRATORY:  Clear to auscultation without rales, wheezing or rhonchi  ABDOMEN: Soft, non-tender, non-distended EXTREMITIES:  No edema; No deformity   ASSESSMENT AND PLAN:    Paroxysmal Atrial Fibrillation  AFL  Post Termination Pauses / Syncope High Risk Medication Monitoring: Amiodarone   CHA2DS2-VASc 6. TSH wnl 07/2022.  -ILR at EOS, removed from system, most recent review from 03/23/23 reviewed, with 29% AF burden  -amiodarone  200 mg daily  -update CMP, TSH, free T4, CBC when labs drawn for procedure -will arrange for cardioversion   -arrange for loop recorder extraction and re-implant > pt would like to continue monitoring   Secondary Hypercoagulable State  -  continue Eliquis , dose reviewed and appropriate by wt / cr   WCT / Fascicular VT  -hx of, monitor.  No evidence on ILR   Hypertension  -well controlled on current regimen      Follow up with EP APP 2 months    Signed, Creighton Doffing, NP-C, AGACNP-BC [facility] - Electrophysiology  06/09/2023, 5:09 PM   Addendum 06/10/23 11:25 am   Discussed plan of care with Dr. Daneil Dunker.  She has normal atrial size and paroxysmal AF.  Called Cynthia [name] to discuss plan of care. This am she feels she is back in NSR with HR in 60's.  Will hold off on planning for DCCV for now (she has previously been planned for one and presented in NSR). We reviewed possibility of  ablation.  She would like to discuss with Dr. Daneil Dunker. In addition, we discussed she has time to consider if she would like to have the loop recorder removed, removed and new re-implanted vs getting a [employer].  Will plan for follow up visit with Dr. Daneil Dunker.   Creighton Doffing, NP-C, AGACNP-BC  [facility] - Electrophysiology  06/10/2023, 11:32 AM

## 2023-06-10 LAB — DERMATOLOGY PATHOLOGY

## 2023-06-11 ENCOUNTER — Ambulatory Visit: Payer: Self-pay | Admitting: Dermatology

## 2023-06-11 NOTE — Telephone Encounter (Signed)
 Called pt and went over results. I went over Mohs and what to expect the day of the appointment. She expressed understand and is awaiting a call to schedule.

## 2023-06-11 NOTE — Telephone Encounter (Signed)
-----   Message from [facility] PACI sent at 06/11/2023 10:47 AM EDT ----- Right Cheek- Moderately Differentiated SCC- Mohs with me, let's get her in ASAP due to subtype of SCC; can she come Monday 6/9 at 8:15 AM?    Please call patient to discuss diagnosis and schedule for Mohs surgery.

## 2023-06-15 DIAGNOSIS — I700 Atherosclerosis of aorta: Secondary | ICD-10-CM | POA: Diagnosis not present

## 2023-06-15 DIAGNOSIS — G4700 Insomnia, unspecified: Secondary | ICD-10-CM | POA: Diagnosis not present

## 2023-06-15 DIAGNOSIS — E039 Hypothyroidism, unspecified: Secondary | ICD-10-CM | POA: Diagnosis not present

## 2023-06-15 DIAGNOSIS — E785 Hyperlipidemia, unspecified: Secondary | ICD-10-CM | POA: Diagnosis not present

## 2023-06-15 DIAGNOSIS — R7303 Prediabetes: Secondary | ICD-10-CM | POA: Diagnosis not present

## 2023-06-15 DIAGNOSIS — C689 Malignant neoplasm of urinary organ, unspecified: Secondary | ICD-10-CM | POA: Diagnosis not present

## 2023-06-15 DIAGNOSIS — I10 Essential (primary) hypertension: Secondary | ICD-10-CM | POA: Diagnosis not present

## 2023-06-15 DIAGNOSIS — I2510 Atherosclerotic heart disease of native coronary artery without angina pectoris: Secondary | ICD-10-CM | POA: Diagnosis not present

## 2023-06-15 DIAGNOSIS — R350 Frequency of micturition: Secondary | ICD-10-CM | POA: Diagnosis not present

## 2023-06-15 DIAGNOSIS — Z0000 Encounter for general adult medical examination without abnormal findings: Secondary | ICD-10-CM | POA: Diagnosis not present

## 2023-06-17 DIAGNOSIS — E785 Hyperlipidemia, unspecified: Secondary | ICD-10-CM | POA: Diagnosis not present

## 2023-06-17 DIAGNOSIS — I10 Essential (primary) hypertension: Secondary | ICD-10-CM | POA: Diagnosis not present

## 2023-06-17 DIAGNOSIS — R7303 Prediabetes: Secondary | ICD-10-CM | POA: Diagnosis not present

## 2023-06-17 DIAGNOSIS — N390 Urinary tract infection, site not specified: Secondary | ICD-10-CM | POA: Diagnosis not present

## 2023-06-17 DIAGNOSIS — E559 Vitamin D deficiency, unspecified: Secondary | ICD-10-CM | POA: Diagnosis not present

## 2023-06-17 DIAGNOSIS — E039 Hypothyroidism, unspecified: Secondary | ICD-10-CM | POA: Diagnosis not present

## 2023-06-17 NOTE — Progress Notes (Signed)
 Electrophysiology Office Note:   Date:  06/19/2023  ID:  Cynthia [name], DOB 1945-05-28, MRN [id number]  Primary Cardiologist: Euell Herrlich, MD Electrophysiologist: Ardeen Kohler, MD      History of Present Illness:   Cynthia [name] is a 78 y.o. female with h/o AF, NSVT (thought to be fasicular), HTN, GERD, DMI II, hypothyroidism & bladder CA who is being seen today for evaluation for catheter ablation.  Discussed the use of AI scribe software for clinical note transcription with the patient, who gave verbal consent to proceed.  She experiences atrial fibrillation approximately 30% of the time. She uses a [employer]ia[employer] device to monitor her heart rate, which varies and can be as low as 56 bpm. Anxiety is noted when using the device, but she recently had a good day when it reported she was in rhythm. She is currently on amiodarone  for atrial fibrillation but continues to experience episodes of going in and out of rhythm. She recalls a past incident where she was diagnosed with both atrial fibrillation and ventricular tachycardia during a hospital visit for a suspected urinary tract infection, leading to a five-day hospitalization and initiation of amiodarone  after another medication was tried. She is symptomatic from her AF but at times reports it is difficult to tell due to her age and a bad back. She has more shortness of breath when walking while in AF.   She has a history of skin cancer and is scheduled for Mohs surgery on July 01, 2023. Additionally, she has a large hernia resulting from a previous kidney removal surgery, which she attributes to an incisional cut. She has lost 40 pounds over four years and maintains this weight loss. Concerns about the appearance of her arms due to blood thinners causing bruising lead her to prefer wearing long sleeves.  She is undergoing regular monitoring for bladder cancer, with her urologist planning a procedure to scrape her bladder. She is on a three-month  monitoring schedule for bladder cancer recurrence.  Socially, she is experiencing stress due to the recent loss of two cats, which were important to her late husband. She is currently caring for two remaining cats.  Review of systems complete and found to be negative unless listed in HPI.   EP Information / Studies Reviewed:    EKG is not ordered today. EKG from 06/09/23 reviewed which showed AF      Echo 02/2022:   1. Left ventricular ejection fraction, by estimation, is 60 to 65%. The  left ventricle has normal function. The left ventricle has no regional  wall motion abnormalities. There is mild concentric left ventricular  hypertrophy. Left ventricular diastolic  parameters were normal.   2. Right ventricular systolic function is normal. The right ventricular  size is normal.   3. The mitral valve is normal in structure. Trivial mitral valve  regurgitation. No evidence of mitral stenosis.   4. The aortic valve is tricuspid. Aortic valve regurgitation is trivial.  Mild aortic valve stenosis.   5. The inferior vena cava is normal in size with greater than 50%  respiratory variability, suggesting right atrial pressure of 3 mmHg.   Cardiac MRI 12/13/2018: IMPRESSION: 1. 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 Inferior RV insertion site late gadolinium enhancement, which is a nonspecific finding often seen in setting of elevated pulmonary pressures but has been described in LV noncompaction 3.  Normal LV size with mildly reduced systolic function (EF 52%) 4.  Normal RV  size and systolic function (EF 55%) 5. Small to moderate pericardial effusion, measuring up to 10mm adjacent to inferior LV wall  Risk Assessment/Calculations:    CHA2DS2-VASc Score = 6   This indicates a 9.7% annual risk of stroke. The patient's score is based upon: CHF History: 0 HTN History: 1 Diabetes History: 1 Stroke History: 0 Vascular Disease History:  1 Age Score: 2 Gender Score: 1             Physical Exam:   VS:  BP 120/66   Pulse 86   Ht 5' 3 (1.6 m)   Wt 155 lb (70.3 kg)   SpO2 96%   BMI 27.46 kg/m    Wt Readings from Last 3 Encounters:  06/18/23 155 lb (70.3 kg)  06/09/23 160 lb 9.6 oz (72.8 kg)  05/28/23 165 lb (74.8 kg)     GEN: Well nourished, well developed in no acute distress NECK: No JVD CARDIAC: Normal rate, irregular rhythm RESPIRATORY:  Clear to auscultation without rales, wheezing or rhonchi  ABDOMEN: Soft, non-distended EXTREMITIES:  No edema; No deformity   ASSESSMENT AND PLAN:    Paroxysmal atrial fibrillation, symptomatic: Continues to have episodes despite amiodarone . Typical atrial flutter Postconversion pauses status post loop recorder: Battery now at end-of-life. High Risk Medication Monitoring: Amiodarone .  LFTs normal in September 2024.  TSH normal July 2024. -Discussed treatment options today for AF and AFL including antiarrhythmic drug therapy and ablation. Discussed risks, recovery and likelihood of success with each treatment strategy. Risk, benefits, and alternatives to EP study and ablation for afib and flutter were discussed. These risks include but are not limited to stroke, bleeding, vascular damage, tamponade, perforation, damage to the esophagus, lungs, phrenic nerve and other structures, pulmonary vein stenosis, worsening renal function, coronary vasospasm and death.  Discussed potential need for repeat ablation procedures and antiarrhythmic drugs after an initial ablation. The patient understands these risk and wishes to proceed.  We will therefore proceed with catheter ablation at the next available time.  Carto, ICE, anesthesia are requested for the procedure.  Will also obtain CT PV protocol prior to the procedure to exclude LAA thrombus and further evaluate atrial anatomy. -Will explant loop recorder and reimplant at time of ablation given history of syncope, VT, and postconversion  pauses. - Continue amiodarone  as bridge to ablation.  Will update LFTs and TSH.   Secondary hypercoagulable state due to atrial fibrillation: - Continue Eliquis  5 mg twice daily.   WCT / Fascicular VT  - Continue amiodarone . -Continue monitoring with loop recorder.   Hypertension At goal today.  Recommend checking blood pressures 1-2 times per week at home and recording the values.  Recommend bringing these recordings to the primary care physician.  Follow up with Dr. Daneil Dunker 3 months after ablation   Signed, Ardeen Kohler, MD

## 2023-06-18 ENCOUNTER — Encounter: Payer: Self-pay | Admitting: Cardiology

## 2023-06-18 ENCOUNTER — Ambulatory Visit: Attending: Cardiology | Admitting: Cardiology

## 2023-06-18 VITALS — BP 120/66 | HR 86 | Ht 63.0 in | Wt 155.0 lb

## 2023-06-18 DIAGNOSIS — Z9889 Other specified postprocedural states: Secondary | ICD-10-CM

## 2023-06-18 DIAGNOSIS — Z79899 Other long term (current) drug therapy: Secondary | ICD-10-CM | POA: Diagnosis not present

## 2023-06-18 DIAGNOSIS — I472 Ventricular tachycardia, unspecified: Secondary | ICD-10-CM

## 2023-06-18 DIAGNOSIS — I480 Paroxysmal atrial fibrillation: Secondary | ICD-10-CM | POA: Diagnosis not present

## 2023-06-18 DIAGNOSIS — I483 Typical atrial flutter: Secondary | ICD-10-CM

## 2023-06-18 DIAGNOSIS — D6869 Other thrombophilia: Secondary | ICD-10-CM | POA: Diagnosis not present

## 2023-06-18 DIAGNOSIS — I10 Essential (primary) hypertension: Secondary | ICD-10-CM | POA: Diagnosis not present

## 2023-06-18 NOTE — Patient Instructions (Addendum)
 Medication Instructions:  Your physician recommends that you continue on your current medications as directed. Please refer to the Current Medication list given to you today.  *If you need a refill on your cardiac medications before your next appointment, please call your pharmacy*  Lab Work: BMET and CBC - please come to [facility] the week of August 4th to have your labs drawn  Testing/Procedures: Cardiac CT Your physician has requested that you have cardiac CT. Cardiac computed tomography (CT) is a painless test that uses an x-ray machine to take clear, detailed pictures of your heart. For further information please visit [email or web address] Please follow instruction sheet as given. We will call you to schedule your CT scan. It will be done about three weeks prior to your ablation.  Ablation Your physician has recommended that you have an ablation. Catheter ablation is a medical procedure used to treat some cardiac arrhythmias (irregular heartbeats). During catheter ablation, a long, thin, flexible tube is put into a blood vessel in your groin (upper thigh), or neck. This tube is called an ablation catheter. It is then guided to your heart through the blood vessel. Radio frequency waves destroy small areas of heart tissue where abnormal heartbeats may cause an arrhythmia to start.  You are scheduled for Atrial Fibrillation Ablation on Wednesday, August 27 with Dr. Clinton Danas.Please arrive at the Main Entrance A at [facility]: [address] [place], [place] [address] at 9:00 AM   Follow-Up: At [facility], you and your health needs are our priority.  As part of our continuing mission to provide you with exceptional heart care, we have created designated Provider Care Teams.  These Care Teams include your primary Cardiologist (physician) and Advanced Practice Providers (APPs -  Physician Assistants and Nurse Practitioners) who all work together  to provide you with the care you need, when you need it.   Your next appointment:   We will contact you about your post-procedure follow up appointments.

## 2023-06-23 ENCOUNTER — Other Ambulatory Visit: Payer: Self-pay | Admitting: Urology

## 2023-06-23 ENCOUNTER — Telehealth: Payer: Self-pay | Admitting: Internal Medicine

## 2023-06-23 NOTE — Telephone Encounter (Signed)
   Pre-operative Risk Assessment    Patient Name: Cynthia [name]  DOB: 01-01-1946 MRN: [id number]   Date of last office visit: 06/18/2023 Date of next office visit: TBD   Request for Surgical Clearance    Procedure:  Resection of bladder tumor   Date of Surgery:  Clearance 07/08/23                                Surgeon:  Dr. Floreen Hunger Group or Practice Name:  [facility]  Phone number:  [phone] Ext: [phone]  Fax number:  [phone]   Type of Clearance Requested:   - Medical  - Pharmacy:  Hold Apixaban  (Eliquis ) TBD by Card   Type of Anesthesia:  General    Additional requests/questions:    Audley Bleak   06/23/2023, 8:35 AM

## 2023-06-23 NOTE — Telephone Encounter (Signed)
 Dr Daneil Dunker,  You saw this patient on 06/18/2023. Per office protocol, will you please comment on medical clearance for Resection of bladder tumor on 07/08/2023?  Please route your response to P CV DIV Preop. I will communicate with requesting office once you have given recommendations.   Thank you!  Morey Ar, NP

## 2023-06-23 NOTE — Telephone Encounter (Signed)
 Please advise holding Eliquis  prior to resection of bladder tumor on 07/08/2023.  Thank you!  DW

## 2023-06-26 ENCOUNTER — Other Ambulatory Visit: Payer: Self-pay

## 2023-06-26 MED ORDER — AMIODARONE HCL 200 MG PO TABS: 200.0000 mg | ORAL_TABLET | Freq: Every day | ORAL | 3 refills | Status: AC

## 2023-06-26 NOTE — Telephone Encounter (Signed)
 Patient with diagnosis of atrial fibrillation on Eliquis  for anticoagulation.    Procedure:  Resection of bladder tumor    Date of Surgery:  Clearance 07/08/23   CHA2DS2-VASc Score = 6   This indicates a 9.7% annual risk of stroke. The patient's score is based upon: CHF History: 0 HTN History: 1 Diabetes History: 1 Stroke History: 0 Vascular Disease History: 1 Age Score: 2 Gender Score: 1   CrCl 53  Platelet count 164  Patient has not had an Afib/aflutter ablation within the last 3 months or DCCV within the last 30 days  Per office protocol, patient can hold Eliquis  for 2 days prior to procedure.   Patient will not need bridging with Lovenox (enoxaparin) around procedure.  **This guidance is not considered finalized until pre-operative APP has relayed final recommendations.**

## 2023-06-26 NOTE — Progress Notes (Signed)
 Sent message, via epic in basket, requesting orders in epic from Careers adviser.

## 2023-06-26 NOTE — Telephone Encounter (Signed)
   Patient Name: Cynthia [name]  DOB: [birth year] MRN: [id number]  Primary Cardiologist: Euell Herrlich, MD  Chart reviewed as part of pre-operative protocol coverage. Given past medical history and time since last visit, based on ACC/AHA guidelines, Cynthia [name] is at acceptable risk for the planned procedure without further cardiovascular testing.   Per Dr. Daneil Dunker: Hold for 2 days and resume when safe from surgical perspective - per usual protocol.    [name]  Patient with diagnosis of atrial fibrillation on Eliquis  for anticoagulation.     Procedure:  Resection of bladder tumor    Date of Surgery:  Clearance 07/08/23     CHA2DS2-VASc Score = 6   This indicates a 9.7% annual risk of stroke. The patient's score is based upon: CHF History: 0 HTN History: 1 Diabetes History: 1 Stroke History: 0 Vascular Disease History: 1 Age Score: 2 Gender Score: 1   CrCl 53             Platelet count 164   Patient has not had an Afib/aflutter ablation within the last 3 months or DCCV within the last 30 days   Per office protocol, patient can hold Eliquis  for 2 days prior to procedure.   Patient will not need bridging with Lovenox (enoxaparin) around procedure.      The patient was advised that if she develops new symptoms prior to surgery to contact our office to arrange for a follow-up visit, and she verbalized understanding.  I will route this recommendation to the requesting party via Epic fax function and remove from pre-op pool.  Please call with questions.  Friddie Jetty, NP 06/26/2023, 11:35 AM

## 2023-06-30 ENCOUNTER — Other Ambulatory Visit: Payer: Self-pay | Admitting: Urology

## 2023-07-01 ENCOUNTER — Encounter: Admitting: Dermatology

## 2023-07-02 NOTE — Patient Instructions (Addendum)
 SURGICAL WAITING ROOM VISITATION Patients having surgery or a procedure may have no more than 2 support people in the waiting area - these visitors may rotate in the visitor waiting room.   If the patient needs to stay at the hospital during part of their recovery, the visitor guidelines for inpatient rooms apply.  PRE-OP VISITATION  Pre-op nurse will coordinate an appropriate time for 1 support person to accompany the patient in pre-op.  This support person may not rotate.  This visitor will be contacted when the time is appropriate for the visitor to come back in the pre-op area.  Please refer to the [facility] website for the visitor guidelines for Inpatients (after your surgery is over and you are in a regular room).  You are not required to quarantine at this time prior to your surgery. However, you must do this: Hand Hygiene often Do NOT share personal items Notify your provider if you are in close contact with someone who has COVID or you develop fever 100.4 or greater, new onset of sneezing, cough, sore throat, shortness of breath or body aches.  If you test positive for Covid or have been in contact with anyone that has tested positive in the last 10 days please notify you surgeon.    Your procedure is scheduled on:  [facility]  July 08, 2023  Report to [facility] Main Entrance: [address] entrance where the [employer] is available.   Report to admitting at:  12:45  PM  Call this number if you have any questions or problems the morning of surgery [phone]  DO NOT EAT OR DRINK ANYTHING AFTER MIDNIGHT THE NIGHT PRIOR TO YOUR SURGERY / PROCEDURE.   FOLLOW  ANY ADDITIONAL PRE OP INSTRUCTIONS YOU RECEIVED FROM YOUR SURGEON'S OFFICE!!!   Oral Hygiene is also important to reduce your risk of infection.        Remember - BRUSH YOUR TEETH THE MORNING OF SURGERY WITH YOUR REGULAR TOOTHPASTE  Do NOT smoke after Midnight the night before surgery.  STOP TAKING all Vitamins,  Herbs and supplements 1 week before your surgery.   Take ONLY these medicines the morning of surgery with A SIP OF WATER : omeprazole, levothyroxine , amiodarone , and Tylenol  if needed. You may use your Eye drops if needed.                    You may not have any metal on your body including hair pins, jewelry, and body piercing  Do not wear make-up, lotions, powders, perfumes or deodorant  Do not wear nail polish including gel and S&S, artificial / acrylic nails, or any other type of covering on natural nails including finger and toenails. If you have artificial nails, gel coating, etc., that needs to be removed by a nail salon, Please have this removed prior to surgery. Not doing so may mean that your surgery could be cancelled or delayed if the Surgeon or anesthesia staff feels like they are unable to monitor you safely.   Do not shave 48 hours prior to surgery to avoid nicks in your skin which may contribute to postoperative infections.   Contacts, Hearing Aids, dentures or bridgework may not be worn into surgery. DENTURES WILL BE REMOVED PRIOR TO SURGERY PLEASE DO NOT APPLY Poly grip OR ADHESIVES!!!  You may bring a small overnight bag with you on the day of surgery, only pack items that are not valuable. [facility] IS NOT RESPONSIBLE   FOR VALUABLES THAT  ARE LOST OR STOLEN.   Do not bring your home medications to the hospital. The Pharmacy will dispense medications listed on your medication list to you during your admission in the Hospital.  Special Instructions: Bring a copy of your healthcare power of attorney and living will documents the day of surgery, if you wish to have them scanned into your [facility] Records- EPIC  Please read over the following fact sheets you were given: IF YOU HAVE QUESTIONS ABOUT YOUR PRE-OP INSTRUCTIONS, PLEASE CALL [phone].   Before surgery, you can play an important role.  Because skin is not sterile, your skin needs to be as free of germs  as possible.  You can reduce the number of germs on your skin by washing with Antibacterial soap before surgery.  . Do not shave (including legs and underarms) for at least 48 hours prior to the first shower.  You may shave your face/neck.  Please follow these instructions carefully:  1.  Shower with antibacterial Soap the night before surgery and the  morning of surgery.  2.  If you choose to wash your hair, wash your hair first as usual with your normal  shampoo.  3.  After you shampoo, rinse your hair and body thoroughly to remove the shampoo.                             4.  You can apply soap directly to the skin and wash.  Gently with a scrungie or clean washcloth.  5.  Wash face,  Genitals (private parts) with your normal soap.             6.  Wash thoroughly, paying special attention to the area where your  surgery  will be performed.  7.  Thoroughly rinse your body with warm water  from the neck down.  8.   Pat yourself dry with a clean towel.             9  Wear clean pajamas.            10 Place clean sheets on your bed the night of your first shower and do not  sleep with pets.  ON THE DAY OF SURGERY : Do not apply any lotions/deodorants the morning of surgery.  Please wear clean clothes to the hospital/surgery center.  FAILURE TO FOLLOW THESE INSTRUCTIONS MAY RESULT IN THE CANCELLATION OF YOUR SURGERY  PATIENT SIGNATURE_________________________________  NURSE SIGNATURE__________________________________

## 2023-07-02 NOTE — Progress Notes (Addendum)
 The patient was identified using 2 approved identifiers. All issues noted in this document were discussed and addressed, Cynthia  [name] voiced understanding and agreement with all preoperative instructions. The patient was emailed the surgery instructions per her request.     [email or web address]   The patient was instructed to call our Admitting Office [phone] or [phone]) to complete their Pre-surgical Interview.    COVID Vaccine received:  []  No [x]  Yes Date of any COVID positive Test in last 90 days:  None  PCP - Cynthia Hint, Cynthia [name] at [facility]  [phone]  Cardiologist - Cynthia Merck, Cynthia [name]  cardiac clearance  06-26-23 Phone note EP- Cynthia Kitty, Cynthia [name]   Oncology- Cynthia Hof, Cynthia [name]  Chest x-ray - 10-06-2021  1v  Epic EKG -  06-09-2023  Epic Stress Test -  ECHO - 02-07-2022  Epic Cardiac Cath - 12-11-2018   CT Coronary Calcium  score:   Bowel Prep - [x]  No  []   Yes ______  Pacemaker / ICD device [x]  No []  Yes   Spinal Cord Stimulator:[x]  No []  Yes    Has Medtronic ILR - left chest      History of Sleep Apnea? [x]  No []  Yes   CPAP used?- [x]  No []  Yes    Does the patient monitor blood sugar?   []  N/A   [x]  No []  Yes  Patient has: []  NO Hx DM   [x]  Pre-DM   []  DM1  []   DM2 Last A1c was: 5.2  on   06-17-23    Blood Thinner / Instructions: Eliquis    hold x 2 days Last dose: 07-05-23  patient is aware Aspirin Instructions:  none   ERAS Protocol Ordered: [x]  No  []  Yes Patient is to be NPO after: MN prior  Dental hx: []  Dentures:  [x]  N/A      []  Bridge or Partial:                   []  Loose or Damaged teeth:   Activity level: Able to walk up 2 flights of stairs without becoming significantly short of breath or having chest pain?  [x]  No   []    Yes   Anesthesia review: A.fib (Ablation planned for 09-02-23), Has ILR in left chest, anemia, Pre-DM, CKD3, S/p left nephroureterectomy for horseshoe kidney,   Patient denies shortness of breath, fever, cough and chest pain at  PAT appointment.  Patient verbalized understanding and agreement to the Pre-Surgical Instructions that were given to them at this PAT appointment. Patient was also educated of the need to review these PAT instructions again prior to her surgery.I reviewed the appropriate phone numbers to call if they have any and questions or concerns.

## 2023-07-03 ENCOUNTER — Encounter (HOSPITAL_COMMUNITY): Payer: Self-pay

## 2023-07-03 ENCOUNTER — Encounter (HOSPITAL_COMMUNITY)
Admission: RE | Admit: 2023-07-03 | Discharge: 2023-07-03 | Disposition: A | Discharge status: Home / Self Care | Source: Ambulatory Visit | Attending: Urology

## 2023-07-03 HISTORY — DX: Pneumonia, unspecified organism: J18.9

## 2023-07-03 HISTORY — DX: Anemia, unspecified: D64.9

## 2023-07-03 HISTORY — DX: Myoneural disorder, unspecified: G70.9

## 2023-07-08 ENCOUNTER — Other Ambulatory Visit: Payer: Self-pay

## 2023-07-08 ENCOUNTER — Ambulatory Visit (HOSPITAL_COMMUNITY): Payer: Self-pay | Admitting: Anesthesiology

## 2023-07-08 ENCOUNTER — Encounter (HOSPITAL_COMMUNITY): Payer: Self-pay | Admitting: Anesthesiology

## 2023-07-08 ENCOUNTER — Ambulatory Visit (HOSPITAL_COMMUNITY)
Admission: RE | Admit: 2023-07-08 | Discharge: 2023-07-08 | Disposition: A | Discharge status: Home / Self Care | Attending: Urology | Admitting: Urology

## 2023-07-08 ENCOUNTER — Ambulatory Visit (HOSPITAL_COMMUNITY)

## 2023-07-08 ENCOUNTER — Encounter (HOSPITAL_COMMUNITY)
Admission: RE | Disposition: A | Discharge status: Home / Self Care | Payer: Self-pay | Source: Home / Self Care | Attending: Urology

## 2023-07-08 ENCOUNTER — Encounter (HOSPITAL_COMMUNITY): Payer: Self-pay | Admitting: Urology

## 2023-07-08 DIAGNOSIS — I472 Ventricular tachycardia, unspecified: Secondary | ICD-10-CM | POA: Diagnosis not present

## 2023-07-08 DIAGNOSIS — E039 Hypothyroidism, unspecified: Secondary | ICD-10-CM | POA: Insufficient documentation

## 2023-07-08 DIAGNOSIS — E119 Type 2 diabetes mellitus without complications: Secondary | ICD-10-CM | POA: Insufficient documentation

## 2023-07-08 DIAGNOSIS — C678 Malignant neoplasm of overlapping sites of bladder: Secondary | ICD-10-CM | POA: Insufficient documentation

## 2023-07-08 DIAGNOSIS — I350 Nonrheumatic aortic (valve) stenosis: Secondary | ICD-10-CM

## 2023-07-08 DIAGNOSIS — I080 Rheumatic disorders of both mitral and aortic valves: Secondary | ICD-10-CM | POA: Insufficient documentation

## 2023-07-08 DIAGNOSIS — D759 Disease of blood and blood-forming organs, unspecified: Secondary | ICD-10-CM | POA: Diagnosis not present

## 2023-07-08 DIAGNOSIS — Z7901 Long term (current) use of anticoagulants: Secondary | ICD-10-CM | POA: Insufficient documentation

## 2023-07-08 DIAGNOSIS — I10 Essential (primary) hypertension: Secondary | ICD-10-CM | POA: Insufficient documentation

## 2023-07-08 DIAGNOSIS — D494 Neoplasm of unspecified behavior of bladder: Secondary | ICD-10-CM | POA: Diagnosis not present

## 2023-07-08 DIAGNOSIS — I480 Paroxysmal atrial fibrillation: Secondary | ICD-10-CM | POA: Insufficient documentation

## 2023-07-08 DIAGNOSIS — F32A Depression, unspecified: Secondary | ICD-10-CM | POA: Insufficient documentation

## 2023-07-08 DIAGNOSIS — I4891 Unspecified atrial fibrillation: Secondary | ICD-10-CM

## 2023-07-08 DIAGNOSIS — Z853 Personal history of malignant neoplasm of breast: Secondary | ICD-10-CM | POA: Insufficient documentation

## 2023-07-08 DIAGNOSIS — M1990 Unspecified osteoarthritis, unspecified site: Secondary | ICD-10-CM | POA: Insufficient documentation

## 2023-07-08 DIAGNOSIS — F419 Anxiety disorder, unspecified: Secondary | ICD-10-CM | POA: Diagnosis not present

## 2023-07-08 DIAGNOSIS — Q631 Lobulated, fused and horseshoe kidney: Secondary | ICD-10-CM | POA: Insufficient documentation

## 2023-07-08 DIAGNOSIS — C679 Malignant neoplasm of bladder, unspecified: Secondary | ICD-10-CM

## 2023-07-08 DIAGNOSIS — K219 Gastro-esophageal reflux disease without esophagitis: Secondary | ICD-10-CM | POA: Insufficient documentation

## 2023-07-08 HISTORY — PX: TRANSURETHRAL RESECTION OF BLADDER TUMOR: SHX2575

## 2023-07-08 HISTORY — PX: CYSTOSCOPY W/ RETROGRADES: SHX1426

## 2023-07-08 HISTORY — DX: Prediabetes: R73.03

## 2023-07-08 SURGERY — TURBT (TRANSURETHRAL RESECTION OF BLADDER TUMOR)
Anesthesia: General | Laterality: Right

## 2023-07-08 MED ORDER — SENNOSIDES-DOCUSATE SODIUM 8.6-50 MG PO TABS: 1.0000 | ORAL_TABLET | Freq: Two times a day (BID) | ORAL | 0 refills | Status: DC

## 2023-07-08 MED ORDER — PROPOFOL 10 MG/ML IV BOLUS
INTRAVENOUS | Status: AC
  Filled 2023-07-08: qty 20

## 2023-07-08 MED ORDER — SUGAMMADEX SODIUM 200 MG/2ML IV SOLN
INTRAVENOUS | Status: DC | PRN
  Administered 2023-07-08: 300 mg via INTRAVENOUS

## 2023-07-08 MED ORDER — IOHEXOL 300 MG/ML  SOLN
INTRAMUSCULAR | Status: DC | PRN
  Administered 2023-07-08: 20 mL

## 2023-07-08 MED ORDER — LACTATED RINGERS IV SOLN: INTRAVENOUS | Status: DC

## 2023-07-08 MED ORDER — ACETAMINOPHEN 500 MG PO TABS
1000.0000 mg | ORAL_TABLET | Freq: Once | ORAL | Status: DC
  Filled 2023-07-08: qty 2

## 2023-07-08 MED ORDER — LIDOCAINE HCL (PF) 2 % IJ SOLN
INTRAMUSCULAR | Status: DC | PRN
  Administered 2023-07-08: 60 mg via INTRADERMAL

## 2023-07-08 MED ORDER — LIDOCAINE HCL (PF) 2 % IJ SOLN
INTRAMUSCULAR | Status: AC
  Filled 2023-07-08: qty 5

## 2023-07-08 MED ORDER — DEXAMETHASONE SODIUM PHOSPHATE 10 MG/ML IJ SOLN
INTRAMUSCULAR | Status: DC | PRN
  Administered 2023-07-08: 5 mg via INTRAVENOUS

## 2023-07-08 MED ORDER — ROCURONIUM BROMIDE 10 MG/ML (PF) SYRINGE
PREFILLED_SYRINGE | INTRAVENOUS | Status: DC | PRN
  Administered 2023-07-08: 40 mg via INTRAVENOUS

## 2023-07-08 MED ORDER — ONDANSETRON HCL 4 MG/2ML IJ SOLN
INTRAMUSCULAR | Status: AC
Start: 2023-07-08 — End: 2023-07-08
  Filled 2023-07-08: qty 2

## 2023-07-08 MED ORDER — EPHEDRINE SULFATE-NACL 50-0.9 MG/10ML-% IV SOSY
PREFILLED_SYRINGE | INTRAVENOUS | Status: DC | PRN
  Administered 2023-07-08: 5 mg via INTRAVENOUS

## 2023-07-08 MED ORDER — FENTANYL CITRATE (PF) 100 MCG/2ML IJ SOLN
INTRAMUSCULAR | Status: AC
  Filled 2023-07-08: qty 2

## 2023-07-08 MED ORDER — PHENYLEPHRINE 80 MCG/ML (10ML) SYRINGE FOR IV PUSH (FOR BLOOD PRESSURE SUPPORT)
PREFILLED_SYRINGE | INTRAVENOUS | Status: DC | PRN
  Administered 2023-07-08: 160 ug via INTRAVENOUS

## 2023-07-08 MED ORDER — GENTAMICIN SULFATE 40 MG/ML IJ SOLN
5.0000 mg/kg | Freq: Once | INTRAVENOUS | Status: AC
  Administered 2023-07-08: 350 mg via INTRAVENOUS
  Filled 2023-07-08: qty 8.75

## 2023-07-08 MED ORDER — FENTANYL CITRATE (PF) 250 MCG/5ML IJ SOLN
INTRAMUSCULAR | Status: DC | PRN
Start: 2023-07-08 — End: 2023-07-08
  Administered 2023-07-08: 50 ug via INTRAVENOUS

## 2023-07-08 MED ORDER — SODIUM CHLORIDE 0.9 % IR SOLN
Status: DC | PRN
  Administered 2023-07-08: 3000 mL via INTRAVESICAL

## 2023-07-08 MED ORDER — PROPOFOL 10 MG/ML IV BOLUS
INTRAVENOUS | Status: DC | PRN
  Administered 2023-07-08: 80 mg via INTRAVENOUS

## 2023-07-08 MED ORDER — ONDANSETRON HCL 4 MG/2ML IJ SOLN
INTRAMUSCULAR | Status: DC | PRN
  Administered 2023-07-08: 4 mg via INTRAVENOUS

## 2023-07-08 MED ORDER — ORAL CARE MOUTH RINSE: 15.0000 mL | Freq: Once | OROMUCOSAL | Status: AC

## 2023-07-08 MED ORDER — DEXAMETHASONE SODIUM PHOSPHATE 10 MG/ML IJ SOLN
INTRAMUSCULAR | Status: AC
  Filled 2023-07-08: qty 1

## 2023-07-08 MED ORDER — TRAMADOL HCL 50 MG PO TABS: 50.0000 mg | ORAL_TABLET | Freq: Four times a day (QID) | ORAL | 0 refills | Status: DC | PRN

## 2023-07-08 MED ORDER — CHLORHEXIDINE GLUCONATE 0.12 % MT SOLN
15.0000 mL | Freq: Once | OROMUCOSAL | Status: AC
  Administered 2023-07-08: 15 mL via OROMUCOSAL

## 2023-07-08 SURGICAL SUPPLY — 17 items
BAG URINE DRAIN 2000ML AR STRL (UROLOGICAL SUPPLIES) IMPLANT
BAG URO CATCHER STRL LF (MISCELLANEOUS) ×1 IMPLANT
CATH URETL OPEN END 6FR 70 (CATHETERS) IMPLANT
CLOTH BEACON ORANGE TIMEOUT ST (SAFETY) ×1 IMPLANT
DRAPE FOOT SWITCH (DRAPES) ×1 IMPLANT
ELECT REM PT RETURN 15FT ADLT (MISCELLANEOUS) ×1 IMPLANT
GLOVE SURG LX STRL 7.5 STRW (GLOVE) ×1 IMPLANT
GOWN STRL REUS W/ TWL XL LVL3 (GOWN DISPOSABLE) ×1 IMPLANT
GUIDEWIRE STR DUAL SENSOR (WIRE) ×1 IMPLANT
KIT TURNOVER KIT A (KITS) ×1 IMPLANT
LOOP CUT BIPOLAR 24F LRG (ELECTROSURGICAL) IMPLANT
MANIFOLD NEPTUNE II (INSTRUMENTS) ×1 IMPLANT
NS IRRIG 1000ML POUR BTL (IV SOLUTION) IMPLANT
PACK CYSTO (CUSTOM PROCEDURE TRAY) ×1 IMPLANT
SYRINGE TOOMEY IRRIG 70ML (MISCELLANEOUS) IMPLANT
TUBING CONNECTING 10 (TUBING) ×1 IMPLANT
TUBING UROLOGY SET (TUBING) ×1 IMPLANT

## 2023-07-08 NOTE — Anesthesia Procedure Notes (Signed)
 Procedure Name: Intubation Date/Time: 07/08/2023 4:06 PM  Performed by: Cena Epps, CRNAPre-anesthesia Checklist: Patient identified, Emergency Drugs available, Suction available and Patient being monitored Patient Re-evaluated:Patient Re-evaluated prior to induction Oxygen Delivery Method: Circle System Utilized Preoxygenation: Pre-oxygenation with 100% oxygen Induction Type: IV induction Ventilation: Mask ventilation without difficulty Laryngoscope Size: Mac and 3 Grade View: Grade II Tube type: Oral Tube size: 7.0 mm Number of attempts: 1 Airway Equipment and Method: Stylet and Oral airway Placement Confirmation: ETT inserted through vocal cords under direct vision, positive ETCO2 and breath sounds checked- equal and bilateral Secured at: 22 cm Tube secured with: Tape Dental Injury: Teeth and Oropharynx as per pre-operative assessment

## 2023-07-08 NOTE — Transfer of Care (Signed)
 Immediate Anesthesia Transfer of Care Note  Patient: Cynthia [name]  Procedure(s) Performed: TURBT (TRANSURETHRAL RESECTION OF BLADDER TUMOR) (Right) CYSTOSCOPY, WITH RETROGRADE PYELOGRAM (Right)  Patient Location: PACU  Anesthesia Type:General  Level of Consciousness: awake, alert , and oriented  Airway & Oxygen Therapy: Patient Spontanous Breathing and Patient connected to nasal cannula oxygen  Post-op Assessment: Report given to RN and Post -op Vital signs reviewed and stable  Post vital signs: Reviewed and stable  Last Vitals:  Vitals Value Taken Time  BP 134/77 07/08/23 16:45  Temp    Pulse 55 07/08/23 16:48  Resp 15 07/08/23 16:48  SpO2 100 % 07/08/23 16:48  Vitals shown include unfiled device data.  Last Pain:  Vitals:   07/08/23 1315  TempSrc: Oral  PainSc: 0-No pain      Patients Stated Pain Goal: 5 (07/08/23 1315)  Complications: No notable events documented.

## 2023-07-08 NOTE — Anesthesia Postprocedure Evaluation (Signed)
 Anesthesia Post Note  Patient: Cynthia [name]  Procedure(s) Performed: TURBT (TRANSURETHRAL RESECTION OF BLADDER TUMOR) (Right) CYSTOSCOPY, WITH RETROGRADE PYELOGRAM (Right)     Patient location during evaluation: PACU Anesthesia Type: General Level of consciousness: awake and alert Pain management: pain level controlled Vital Signs Assessment: post-procedure vital signs reviewed and stable Respiratory status: spontaneous breathing, nonlabored ventilation, respiratory function stable and patient connected to nasal cannula oxygen Cardiovascular status: blood pressure returned to baseline and stable Postop Assessment: no apparent nausea or vomiting Anesthetic complications: no   No notable events documented.  Last Vitals:  Vitals:   07/08/23 1700 07/08/23 1715  BP: (!) 143/84 (!) 147/69  Pulse:  (!) 51  Resp: 14 15  Temp:    SpO2:  100%    Last Pain:  Vitals:   07/08/23 1744  TempSrc:   PainSc: 0-No pain                 Bracha Frankowski L Chudney Scheffler

## 2023-07-08 NOTE — H&P (Signed)
 Cynthia [name] is an 78 y.o. female.    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   Summarized Recent Course:  05/2021 - CT, cysto - Lt trigone area recurrence with extravesical bulging ==> T2G3 disease at TURBT ==> 4 cycles neoadjuvant Gemcitibine / Carboplastinum  11/2021 - PET - no overt metastatic disease, Cr 1.1, Hgb 10.8 ==> treat as per stage 1 per her goals of care   02/2022 - cysto no recurrence; 05/2022 cysto no recurrence (?very very subtle papillary changes near old left UO);  08/2022 cysto - stable tiny papillary tissue near old left UO and small area Rt bladder neck ==> surveillance.  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 Most recent Cr <1.5 post-op.   PMH sig for lap chole, AFib / Eliquus (no CVA, just prevention, follows. Dr. Beverli cards), insomnia. SHe is retired Psychologist, occupational. Husband Octaviano passed 11/2022 after decades battle with CVA and brain cancer. Her PCP is Niels Roys MD   Today  [name]  is seen to proceed with transurethral resection of bladder tumor. No interval fevers. Most recent UA without infectious parameters. Held Eliquus as instructed. Most recent Cr <1.  Past Medical History:  Diagnosis Date   Anemia    Anticoagulant long-term use    eliquis --- managed by cardiology   Anxiety    Arthritis    Bladder cancer (HCC)    Bradycardia    Breast cancer (HCC)    left, s/p lumpectomy, Dr Layla   Cancer of  kidney [facility])    left   DDD (degenerative disc disease), lumbar    Depression    Dyspnea    ON EXERTION   First degree heart block    Genetic testing 06/25/2016   Ms. Biscardi underwent genetic counseling and testing for hereditary cancer syndromes on 06/17/2016. Her results were negative for mutations in all 46 genes analyzed by [facility]'s 46-gene Common Hereditary Cancers Panel. Genes analyzed include: APC, ATM, AXIN2, BARD1, BMPR1A, BRCA1, BRCA2, BRIP1, CDH1, CDKN2A, CHEK2, CTNNA1, DICER1, EPCAM, GREM1, HOXB13, KIT, MEN1, MLH1, MSH2, MSH3, MSH6, MUTYH, NBN,   GERD (gastroesophageal reflux disease)    occasional,  takes pepcid    Hematuria    History of radiation therapy 05/22/2016 to 06/20/2016   right breast cancer   Hyperlipidemia    Hypertension    followd by pcp---  (02-22-2019 per pt never had stress test)   Hypothyroidism    followed by pcp   IBS (irritable bowel syndrome)    Incomplete right bundle branch block    Insomnia    Malignant neoplasm of upper-inner quadrant of right breast in female, estrogen receptor positive [facility]) oncologist--- dr layla   dx 03/ 2018,  Stage IA, IDC, ER/PR +;  04-01-2016 s/p  right breast lumpectomy w/ node dissection's;  completed radiation 06-20-2016   MVP (mitral valve prolapse)    per echo 12-11-2018 in epic, mild    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   NSVT (nonsustained ventricular tachycardia) (HCC) followed by cardiology   12-10-2018  hospital admission ,  refer to discharge note 12-14-2018 for treatement   PAF (paroxysmal atrial fibrillation) [facility]) primary cardiologist--- dr loni   newly dx 12-10-2018  admission in epic, Afib w/ RVR and NSVT;   in epic TTE 12-11-2018 showed ef 50-55%, mild MVP,  mild AV sclerosis without stenosis;   Cardiac MRI 12-13-2018 in epic   Pneumonia    Prediabetes    Renal mass, left    pelvis    Restless leg syndrome    occasional   RLS (restless legs syndrome)    Type 2  diabetes mellitus (HCC)    followed by pcp---  (02-22-2019 does not check blood sugar's)    Past Surgical History:  Procedure Laterality Date   BREAST LUMPECTOMY WITH RADIOACTIVE SEED AND SENTINEL LYMPH NODE BIOPSY Right 04/01/2016   Procedure: RADIOACTIVE SEED GUIDED RIGHT BREAST LUMPECTOMY WITH RIGHT AXILLARY SENTINEL LYMPH NODE BIOPSY.;  Surgeon: Elon Pacini, MD;  Location: [facility] OR;  Service: General;  Laterality: Right;   CARDIAC CATHETERIZATION  12/11/2018   CATARACT EXTRACTION W/ INTRAOCULAR LENS  IMPLANT, BILATERAL  1980s   COLONOSCOPY     CYSTOSCOPY W/ RETROGRADES Right 06/05/2021   Procedure: CYSTOSCOPY WITH RETROGRADE PYELOGRAM;  Surgeon: Alvaro Hummer, MD;  Location: [facility]S;  Service: Urology;  Laterality: Right;   CYSTOSCOPY/RETROGRADE/URETEROSCOPY N/A 03/02/2019   Procedure: CYSTOSCOPY/LEFT RETROGRADE/URETEROSCOPY/  BIOPSY/  STENT;  Surgeon: Devere Lonni Righter, MD;  Location[facility];  Service: Urology;  Laterality: N/A;   INCISIONAL HERNIA REPAIR N/A 03/29/2020   Procedure: REPAIR OF INCISIONAL HERNIA WITH MESH;  Surgeon: Vanderbilt Ned, MD;  Location: [facility] OR;  Service: General;  Laterality: N/A;   IR IMAGING GUIDED PORT INSERTION  07/18/2021   IR REMOVAL TUN ACCESS W/ PORT W/O FL MOD SED  11/26/2022   LAPAROSCOPIC CHOLECYSTECTOMY  1990s   LOOP RECORDER INSERTION     Left upper chest, now at EOL, will replace when Ablation is done   ROBOT ASSITED LAPAROSCOPIC NEPHROURETERECTOMY Left 04/27/2019   Procedure: XI ROBOT ASSITED LAPAROSCOPIC NEPHROURETERECTOMY;  Surgeon: Alvaro Hummer, MD;  Location: [facility];  Service: Urology;  Laterality: Left;  3.5 HRS   TONSILLECTOMY  age 6   TRANSURETHRAL RESECTION OF BLADDER TUMOR N/A 06/05/2021   Procedure: TRANSURETHRAL RESECTION OF BLADDER TUMOR (TURBT);  Surgeon: Alvaro Hummer, MD;  Location: [facility];  Service: Urology;  Laterality: N/A;    Family History  Problem Relation Age of Onset   Congestive Heart  Failure Mother    Breast cancer Sister 62       d.54   Cervical cancer Sister 48   Leukemia Brother 6   Leukemia Son    Ovarian cancer Maternal Aunt 60       d.92s   Breast cancer Other 45   Restless legs syndrome Neg Hx    Sleep apnea Neg Hx    Social History:  reports that she has never smoked. She has never used smokeless tobacco. She reports that she does not currently use alcohol. She reports that she does not use drugs.  Allergies: No Known Allergies  No medications prior to admission.    No results found for this or any previous visit (from the past 48 hours). No results found.  Review of Systems  Constitutional:  Negative for chills and fever.  All other systems reviewed and are negative.   There were  no vitals taken for this visit. Physical Exam Vitals reviewed.  Constitutional:      Comments: Pleasant. Stable frailty.   HENT:     Head: Normocephalic.  Eyes:     Pupils: Pupils are equal, round, and reactive to light.  Cardiovascular:     Rate and Rhythm: Normal rate.  Pulmonary:     Effort: Pulmonary effort is normal.  Abdominal:     General: Abdomen is flat.  Genitourinary:    Comments: No CVAT at present Musculoskeletal:        General: Normal range of motion.     Cervical back: Normal range of motion.  Skin:    General: Skin is warm.  Neurological:     General: No focal deficit present.     Mental Status: She is alert.  Psychiatric:        Mood and Affect: Mood normal.      Assessment/Plan  Proceed as planned with TURBT / Rt retrograde. Risks, benefits, alternatives, expected peri-op course discussed previously and reiterated today.   Ricardo KATHEE Alvaro Mickey., MD 07/08/2023, 6:40 AM

## 2023-07-08 NOTE — Anesthesia Preprocedure Evaluation (Addendum)
 Anesthesia Evaluation  Patient identified by MRN, date of birth, ID band Patient awake    Reviewed: Allergy & Precautions, NPO status , Patient's Chart, lab work & pertinent test results  Airway Mallampati: III  TM Distance: >3 FB Neck ROM: Full    Dental no notable dental hx. (+) Teeth Intact, Dental Advisory Given, Caps   Pulmonary neg pulmonary ROS   Pulmonary exam normal breath sounds clear to auscultation       Cardiovascular hypertension, Pt. on medications Normal cardiovascular exam+ dysrhythmias Atrial Fibrillation and Ventricular Tachycardia + Valvular Problems/Murmurs (mild AS) AS  Rhythm:Regular Rate:Normal  TTE 2024  1. Left ventricular ejection fraction, by estimation, is 60 to 65%. The  left ventricle has normal function. The left ventricle has no regional  wall motion abnormalities. There is mild concentric left ventricular  hypertrophy. Left ventricular diastolic  parameters were normal.   2. Right ventricular systolic function is normal. The right ventricular  size is normal.   3. The mitral valve is normal in structure. Trivial mitral valve  regurgitation. No evidence of mitral stenosis.   4. The aortic valve is tricuspid. Aortic valve regurgitation is trivial.  Mild aortic valve stenosis.   5. The inferior vena cava is normal in size with greater than 50%  respiratory variability, suggesting right atrial pressure of 3 mmHg.     Neuro/Psych  PSYCHIATRIC DISORDERS Anxiety Depression    negative neurological ROS     GI/Hepatic Neg liver ROS,GERD  ,,  Endo/Other  diabetes (prediabetes)Hypothyroidism    Renal/GU negative Renal ROS  negative genitourinary   Musculoskeletal  (+) Arthritis ,    Abdominal   Peds  Hematology  (+) Blood dyscrasia (eliquis )   Anesthesia Other Findings   Reproductive/Obstetrics                              Anesthesia Physical Anesthesia  Plan  ASA: 3  Anesthesia Plan: General   Post-op Pain Management: Tylenol  PO (pre-op)*   Induction: Intravenous  PONV Risk Score and Plan: Midazolam , Dexamethasone  and Ondansetron   Airway Management Planned: Oral ETT  Additional Equipment:   Intra-op Plan:   Post-operative Plan: Extubation in OR  Informed Consent: I have reviewed the patients History and Physical, chart, labs and discussed the procedure including the risks, benefits and alternatives for the proposed anesthesia with the patient or authorized representative who has indicated his/her understanding and acceptance.     Dental advisory given  Plan Discussed with: CRNA  Anesthesia Plan Comments:          Anesthesia Quick Evaluation

## 2023-07-08 NOTE — Brief Op Note (Signed)
 07/08/2023  4:29 PM  PATIENT:  Cynthia [name]  78 y.o. female  PRE-OPERATIVE DIAGNOSIS:  RECURRANT BLADDER CANCER  POST-OPERATIVE DIAGNOSIS:  RECURRANT BLADDER CANCER  PROCEDURE:  Procedure(s): TURBT (TRANSURETHRAL RESECTION OF BLADDER TUMOR) (Right) CYSTOSCOPY, WITH RETROGRADE PYELOGRAM (Right)  SURGEON:  Surgeons and Role:    * Manny, Ricardo KATHEE Raddle., MD - Primary  PHYSICIAN ASSISTANT:   ASSISTANTS: none   ANESTHESIA:   general  EBL:  minimal   BLOOD ADMINISTERED:none  DRAINS: none   LOCAL MEDICATIONS USED:  NONE  SPECIMEN:  Source of Specimen:  1 - bladder tumor; base of bladder tumor  DISPOSITION OF SPECIMEN:  PATHOLOGY  COUNTS:  YES  TOURNIQUET:  * No tourniquets in log *  DICTATION: .Other Dictation: Dictation Number [id number]  PLAN OF CARE: Discharge to home after PACU  PATIENT DISPOSITION:  PACU - hemodynamically stable.   Delay start of Pharmacological VTE agent (>24hrs) due to surgical blood loss or risk of bleeding: yes

## 2023-07-08 NOTE — Discharge Instructions (Signed)
 1 - You may have urinary urgency (bladder spasms) and bloody urine on / off for up to 2 weeks. This is normal.  2 - OK to resume Eliquus when no visible blood in urine x 48 hours.   3 - Call MD or go to ER for fever >102, severe pain / nausea / vomiting not relieved by medications, or acute change in medical status

## 2023-07-09 ENCOUNTER — Encounter (HOSPITAL_COMMUNITY): Payer: Self-pay | Admitting: Urology

## 2023-07-09 NOTE — Op Note (Signed)
 Cynthia [name], [name] MEDICAL RECORD NO: [id number] ACCOUNT NO: [id number] DATE OF BIRTH: June 30, 1945 FACILITY: [facility] LOCATION: [facility] PHYSICIAN: Ricardo Likens, MD  Operative Report   DATE OF PROCEDURE: 07/08/2023  PREOPERATIVE DIAGNOSIS:  Recurrent bladder cancer.  PROCEDURE PERFORMED:  1.  Transurethral resection of bladder tumor volume medium. 2.  Right retrograde pyelogram, interpretation.  ESTIMATED BLOOD LOSS:  Nil.  COMPLICATIONS:  None.  SPECIMENS: 1.  Bladder tumor for permanent pathology. 2.  Base of bladder tumor.  FINDINGS: 1.  Approximately 3.5 cm squared total volume recurrence of bladder tumor, mostly at the area of prior left trigone, another foci at the bladder neck, proximal urethra. 2.  Unremarkable right retrograde pyelogram.  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  I counseled her  towards cystectomy at that time, but she refused.  She is very compliant with surveillance.  She has had several focal bladder recurrences in this time and was found most recently on her surveillance cystoscopy to have a significant recurrence in her  bladder.  It is clearly felt that endoscopic resection was warranted.  She presents for this today.  Informed consent was obtained and placed in the medical record.  DESCRIPTION OF PROCEDURE:  The patient was being verified, procedure being transurethral resection of bladder tumor and right retrograde was confirmed.  Procedure timeout was performed.  Intravenous antibiotics were administered.  General endotracheal  anesthesia induced.  The patient was placed into a low lithotomy position.  A sterile field was created, prepping and draping the base of the vagina, introitus, and proximal thigh using iodine. Cystourethroscopy performed using  21-French rigid cystoscope  with offset lens.  Inspection of urinary bladder revealed unremarkable right trigone and right ureteral orifice.  There was approximately 3 cm foci of papillary tumor in the area of the left trigone where her prior ureteral orifice had been, but this  was all resected.  She also had additional focus of tumor from approximately the 12 o'clock to the 9 o'clock position of the distal bladder neck and very proximal urethra.  The right ureteral orifice was cannulated with 6-French end-hole catheter and  right retrograde pyelogram was obtained.  Right retrograde pyelogram demonstrated single right ureter, single system right kidney, no filling defects or narrowing noted.  Next, the cystoscope was exchanged with a 26-French resectoscope sheath with visual obturator and using resectoscope loop, a  very careful resection was performed of each focus of tumor down to the superficial fibromuscular stroma of the urinary bladder.  This generated numerous bladder tumor fragments which were irrigated and set aside, labeled as such.  Cold cup biopsy  forceps were used to obtain representative deep presumed muscularis samples, labeled as base of bladder tumor.  The resectoscope loop was then used once again using coagulation current, and the areas of resection were carefully fulgurated with a  perimeter around this.  Hemostasis was excellent.  It was not felt that catheterization would be warranted.  The bladder was partially empty per cystoscope.  Procedure was then terminated.  The patient tolerated the procedure well.  No immediate  periprocedural complications.  The patient taken to postanesthesia care in stable condition.  Plan for discharge home.   SHW D: 07/08/2023 4:33:58 pm T: 07/09/2023 1:34:00 am  JOB: [id number]/ [id number]

## 2023-07-12 ENCOUNTER — Encounter (HOSPITAL_BASED_OUTPATIENT_CLINIC_OR_DEPARTMENT_OTHER): Payer: Self-pay

## 2023-07-12 ENCOUNTER — Emergency Department (HOSPITAL_BASED_OUTPATIENT_CLINIC_OR_DEPARTMENT_OTHER)
Admission: EM | Admit: 2023-07-12 | Discharge: 2023-07-12 | Disposition: A | Discharge status: Home / Self Care | Attending: Emergency Medicine | Admitting: Emergency Medicine

## 2023-07-12 ENCOUNTER — Other Ambulatory Visit: Payer: Self-pay

## 2023-07-12 ENCOUNTER — Emergency Department (HOSPITAL_BASED_OUTPATIENT_CLINIC_OR_DEPARTMENT_OTHER): Admitting: Radiology

## 2023-07-12 DIAGNOSIS — Z950 Presence of cardiac pacemaker: Secondary | ICD-10-CM | POA: Diagnosis not present

## 2023-07-12 DIAGNOSIS — R5383 Other fatigue: Secondary | ICD-10-CM

## 2023-07-12 DIAGNOSIS — I4891 Unspecified atrial fibrillation: Secondary | ICD-10-CM | POA: Diagnosis not present

## 2023-07-12 DIAGNOSIS — I4892 Unspecified atrial flutter: Secondary | ICD-10-CM | POA: Insufficient documentation

## 2023-07-12 DIAGNOSIS — J984 Other disorders of lung: Secondary | ICD-10-CM | POA: Diagnosis not present

## 2023-07-12 DIAGNOSIS — R509 Fever, unspecified: Secondary | ICD-10-CM | POA: Diagnosis not present

## 2023-07-12 DIAGNOSIS — R6883 Chills (without fever): Secondary | ICD-10-CM

## 2023-07-12 DIAGNOSIS — Z7901 Long term (current) use of anticoagulants: Secondary | ICD-10-CM | POA: Insufficient documentation

## 2023-07-12 LAB — RESP PANEL BY RT-PCR (RSV, FLU A&B, COVID)  RVPGX2
Influenza A by PCR: NEGATIVE
Influenza B by PCR: NEGATIVE
Resp Syncytial Virus by PCR: NEGATIVE
SARS Coronavirus 2 by RT PCR: NEGATIVE

## 2023-07-12 LAB — CBC
HCT: 31 % — ABNORMAL LOW (ref 36.0–46.0)
Hemoglobin: 9.9 g/dL — ABNORMAL LOW (ref 12.0–15.0)
MCH: 28 pg (ref 26.0–34.0)
MCHC: 31.9 g/dL (ref 30.0–36.0)
MCV: 87.6 fL (ref 80.0–100.0)
Platelets: 166 K/uL (ref 150–400)
RBC: 3.54 MIL/uL — ABNORMAL LOW (ref 3.87–5.11)
RDW: 22.6 % — ABNORMAL HIGH (ref 11.5–15.5)
WBC: 14.3 K/uL — ABNORMAL HIGH (ref 4.0–10.5)
nRBC: 0.6 % — ABNORMAL HIGH (ref 0.0–0.2)

## 2023-07-12 LAB — URINALYSIS, ROUTINE W REFLEX MICROSCOPIC
Bacteria, UA: NONE SEEN
Bilirubin Urine: NEGATIVE
Glucose, UA: NEGATIVE mg/dL
Ketones, ur: NEGATIVE mg/dL
Nitrite: NEGATIVE
Specific Gravity, Urine: 1.024 (ref 1.005–1.030)
pH: 7.5 (ref 5.0–8.0)

## 2023-07-12 LAB — BASIC METABOLIC PANEL WITH GFR
Anion gap: 10 (ref 5–15)
BUN: 20 mg/dL (ref 8–23)
CO2: 27 mmol/L (ref 22–32)
Calcium: 9.6 mg/dL (ref 8.9–10.3)
Chloride: 99 mmol/L (ref 98–111)
Creatinine, Ser: 1.21 mg/dL — ABNORMAL HIGH (ref 0.44–1.00)
GFR, Estimated: 45 mL/min — ABNORMAL LOW (ref >60)
Glucose, Bld: 146 mg/dL — ABNORMAL HIGH (ref 70–99)
Potassium: 3.6 mmol/L (ref 3.5–5.1)
Sodium: 136 mmol/L (ref 135–145)

## 2023-07-12 LAB — TROPONIN T, HIGH SENSITIVITY: Troponin T High Sensitivity: <15 ng/L (ref <19)

## 2023-07-12 MED ORDER — SODIUM CHLORIDE 0.9 % IV SOLN
2.0000 g | Freq: Once | INTRAVENOUS | Status: AC
  Administered 2023-07-12: 2 g via INTRAVENOUS
  Filled 2023-07-12: qty 20

## 2023-07-12 MED ORDER — AMOXICILLIN-POT CLAVULANATE 875-125 MG PO TABS: 1.0000 | ORAL_TABLET | Freq: Two times a day (BID) | ORAL | 0 refills | Status: AC

## 2023-07-12 MED ORDER — SODIUM CHLORIDE 0.9 % IV BOLUS
1000.0000 mL | Freq: Once | INTRAVENOUS | Status: AC
  Administered 2023-07-12: 1000 mL via INTRAVENOUS

## 2023-07-12 NOTE — Discharge Instructions (Addendum)
 We talked about the possibility that you may have an infection.  Your xray and urine did not show obvious signs of infection, but there may be another cause, including a blood infection, that is not obvious at this time.  We sent blood cultures to look for this.  These will take a few days to result.  I recommended keeping you in the hospital for monitoring but you did not want to stay in the hospital.  You understand that you may have a serious undiagnosed condition, which may be life-threatening.  You understand that you need to return to the hospital ER immediately if your symptoms get worse, including lightheadedness, confusion, vomiting, or persistent rapid heart rate (above 110 beats per minute while at rest).  Drink lots of water  at home.  We gave you an IV antibiotic in the ER, and I prescribed you 6 more days of Augmentin  antibiotic to take at home.  You should start this tomorrow on 07/13/23.  Follow up with your doctor or cardiologist this week to recheck your symptoms and heart rate.

## 2023-07-12 NOTE — ED Notes (Signed)
 Dc instructions reviewed with patient. Patient voiced understanding. Dc with belongings.

## 2023-07-12 NOTE — ED Triage Notes (Signed)
 Pt reports feelling ill starting yesterday (7/4). Pt reports having a fever of 101, chills, and fatigue. Pt also possibly in a-fib, states my heart feels fast. Pt last took Tylenol  around 0800.

## 2023-07-12 NOTE — ED Provider Notes (Signed)
 [place] EMERGENCY DEPARTMENT AT [facility] Provider Note   CSN: [id number] Arrival date & time: 07/12/23  1236     Patient presents with: Fever and Atrial Fibrillation   Cynthia [name] is a 78 y.o. female with a history of paroxysmal A-fib on amiodarone  and Eliquis  presented to ED with complaint of fatigue, fevers and chills beginning yesterday.  Patient reports she had a TURPT procedure on 07/08/23 with urology.  She says for the past 24 hours she felt very fatigued, has had shaking chills, also reports an acute on chronic cough.  She does take in her normal medications.  She says typically her resting heart rate is 50-60 beats a minute although she is in A-fib 30% of the time.  She has an ablation in the future scheduled with cardiology.  She does have a pacemaker but reports that they told me my battery was dead.  Patient reports she has had monthly cycles similar to this where she had fevers and chills and fatigue for a few days that then resolves.  She states that no one has been able to figure it out.   External records reviewed including cardiology pacemaker evaluation from March 2025, noting 1 A-fib episode lasting 9.5 hours, total A-fib burden of 29%.   HPI     Prior to Admission medications   Medication Sig Start Date End Date Taking? Authorizing Provider  amoxicillin -clavulanate (AUGMENTIN ) 875-125 MG tablet Take 1 tablet by mouth every 12 (twelve) hours for 6 days. 07/13/23 07/19/23 Yes Rosamary Boudreau, Donnice PARAS, MD  acetaminophen  (TYLENOL ) 650 MG CR tablet Take 1,300 mg by mouth at bedtime.    [provider]  ALPRAZolam  (XANAX ) 0.5 MG tablet Take 0.5-1 mg by mouth See admin instructions. Take 2 tablets (1 mg) by mouth scheduled every night at bedtime, may take an additional dose (0.5 mg) in 4 hours if still unable to sleep. 04/04/21   [provider]  amiodarone  (PACERONE ) 200 MG tablet Take 1 tablet (200 mg total) by mouth daily. 06/26/23   Kennyth Chew, MD  carboxymethylcellulose (REFRESH PLUS) 0.5 % SOLN Place 1 drop into both eyes in the morning and at bedtime.    [provider]  Cholecalciferol  (VITAMIN D -3) 25 MCG (1000 UT) CAPS Take 2,000 Units by mouth daily with breakfast.    [provider]  Choline Fenofibrate  (FENOFIBRIC ACID) 135 MG CPDR Take 135 mg by mouth daily. 01/16/16   [provider]  ELIQUIS  5 MG TABS tablet TAKE 1 TABLET BY MOUTH TWICE A DAY 04/13/23   Fernande Elspeth BROCKS, MD  ferrous sulfate  325 (65 FE) MG tablet Take 325 mg by mouth at bedtime.    [provider]  gabapentin  (NEURONTIN ) 100 MG capsule Take 100 mg by mouth at bedtime. 03/26/22   [provider]  levothyroxine  (SYNTHROID , LEVOTHROID) 112 MCG tablet Take 112 mcg by mouth daily. 03/13/16   [provider]  MAGNESIUM  PO Take 250 mg by mouth every evening.    [provider]  omeprazole (PRILOSEC) 40 MG capsule Take 40 mg by mouth daily before breakfast.    [provider]  senna-docusate (SENOKOT-S) 8.6-50 MG tablet Take 1 tablet by mouth 2 (two) times daily. While taking strong pain meds to prevent constipation. 07/08/23   Alvaro Ricardo KATHEE Mickey., MD  traMADol  (ULTRAM ) 50 MG tablet Take 1 tablet (50 mg total) by mouth every 6 (six) hours as needed for moderate pain (pain score 4-6) or severe pain (pain score 7-10) (  post-operatively). 07/08/23 07/07/24  Alvaro Ricardo KATHEE Mickey., MD  traZODone  (DESYREL ) 100 MG tablet Take 50 mg by mouth at bedtime.    [provider]    Allergies: Patient has no known allergies.    Review of Systems  Updated Vital Signs BP 103/64 (BP Location: Left Arm)   Pulse (!) 106   Temp 99.3 F (37.4 C) (Oral)   Resp 20   Ht 5' 3 (1.6 m)   Wt 70.8 kg   SpO2 93%   BMI 27.63 kg/m   Physical Exam Constitutional:      General: She is not in acute distress. HENT:     Head: Normocephalic and atraumatic.  Eyes:     Conjunctiva/sclera: Conjunctivae normal.      Pupils: Pupils are equal, round, and reactive to light.  Cardiovascular:     Rate and Rhythm: Tachycardia present. Rhythm irregular.  Pulmonary:     Effort: Pulmonary effort is normal. No respiratory distress.  Abdominal:     General: There is no distension.     Tenderness: There is no abdominal tenderness.  Skin:    General: Skin is warm and dry.  Neurological:     General: No focal deficit present.     Mental Status: She is alert. Mental status is at baseline.  Psychiatric:        Mood and Affect: Mood normal.        Behavior: Behavior normal.     (all labs ordered are listed, but only abnormal results are displayed) Labs Reviewed  BASIC METABOLIC PANEL WITH GFR - Abnormal; Notable for the following components:      Result Value   Glucose, Bld 146 (*)    Creatinine, Ser 1.21 (*)    GFR, Estimated 45 (*)    All other components within normal limits  CBC - Abnormal; Notable for the following components:   WBC 14.3 (*)    RBC 3.54 (*)    Hemoglobin 9.9 (*)    HCT 31.0 (*)    RDW 22.6 (*)    nRBC 0.6 (*)    All other components within normal limits  URINALYSIS, ROUTINE W REFLEX MICROSCOPIC - Abnormal; Notable for the following components:   Hgb urine dipstick MODERATE (*)    Protein, ur TRACE (*)    Leukocytes,Ua SMALL (*)    All other components within normal limits  RESP PANEL BY RT-PCR (RSV, FLU A&B, COVID)  RVPGX2  CULTURE, BLOOD (ROUTINE X 2)  CULTURE, BLOOD (ROUTINE X 2)  TROPONIN T, HIGH SENSITIVITY    EKG: EKG Interpretation Date/Time:  Sunday July 12 2023 12:46:39 EDT Ventricular Rate:  123 PR Interval:    QRS Duration:  90 QT Interval:  224 QTC Calculation: 320 R Axis:   57  Text Interpretation: Atrial fibrillation with rapid ventricular response When compared with ECG of 09-Jun-2023 15:41, ST now depressed in Inferior leads ST now depressed in Anterolateral leads T wave inversion now evident in Inferior leads T wave inversion now evident in Lateral  leads Confirmed by Cottie Cough [phone]) on 07/12/2023 1:34:07 PM  Radiology: [facility] Chest 2 View Result Date: 07/12/2023 CLINICAL DATA:  Atrial fibrillation. EXAM: CHEST - 2 VIEW COMPARISON:  CT 10/31/2022.  Radiographs 01/28/2022. FINDINGS: Right IJ Port-A-Cath has been removed. The heart size and mediastinal contours are stable. Grossly stable mild scarring at both lung bases and in the right middle lobe. No confluent airspace disease, pleural effusion or pneumothorax. There are postsurgical changes in the right  breast and axilla. Thoracolumbar scoliosis noted without evidence of acute osseous abnormality. Loop recorder noted in the left anterior chest wall. IMPRESSION: No evidence of acute cardiopulmonary process. Stable mild pulmonary scarring bilaterally. Electronically Signed   By: Elsie Perone M.D.   On: 07/12/2023 13:26     Procedures   Medications Ordered in the ED  cefTRIAXone  (ROCEPHIN ) 2 g in sodium chloride  0.9 % 100 mL IVPB (2 g Intravenous New Bag/Given 07/12/23 1433)  sodium chloride  0.9 % bolus 1,000 mL (1,000 mLs Intravenous New Bag/Given 07/12/23 1331)    Clinical Course as of 07/12/23 1441  Sun Jul 12, 2023  1428 Pt reassesed.  I have no found a clear source of infection - although her viral swab is just being drawn now.  I ordered blood cultures and recommended admission for potential bacteremia rule out.  The patient refuses admission.  She cites concerns for insomnia and anxiety in the hospital, and also states that she has had cycles of fevers and chills just like this for months, and they always get better.  This is still concerning to me, but she is willing to do blood cultures and we'll give empiric antibiotics in the ED, rocephin , and then augmentin  at home for 1 week for empiric coverage for possible bacteremia.  She understands if her blood cx are positive she needs to return immediately to the hospital - or if her symptoms worsen.  She lives with two family members who  can keep an eye on her [MT]  1430 HR remains 90's A Fib on tele, she will monitor this at home, but not currently RVR [MT]    Clinical Course User Index [MT] Jamayia Croker, Donnice PARAS, MD                                 Medical Decision Making Amount and/or Complexity of Data Reviewed Labs: ordered. Radiology: ordered.  Risk Prescription drug management.   This patient presents to the ED with concern for fevers, chills, fatigue. This involves an extensive number of treatment options, and is a complaint that carries with it a high risk of complications and morbidity.  The differential diagnosis includes viral infection versus bacterial infection including UTI or postprocedural complication versus pneumonia versus other  Co-morbidities that complicate the patient evaluation: History of A-fib at risk of arrhythmia  External records from outside source obtained and reviewed including cardiology records as noted above  I ordered and personally interpreted labs.  The pertinent results include:  WBC elevation.  UA without evidence of infection  I ordered imaging studies including x-ray of the chest I independently visualized and interpreted imaging which showed no focal abnormality  I agree with the radiologist interpretation  The patient was maintained on a cardiac monitor.  I personally viewed and interpreted the cardiac monitored which showed an underlying rhythm of: A-fib with average heart rate 90-110 bpm  Per my interpretation the patient's ECG shows A-fib with RVR  I ordered medication including IV fluids, IV antibiotic for possible infection  I have reviewed the patients home medicines and have made adjustments as needed  Test Considered: doubt acute meningitis, doubt intraabdominal pathology; doubt ACS, PE  After the interventions noted above, I reevaluated the patient and found that they have: improved  Blood cultures sent and pending.  Hospitalization was considered - patient  refused - see ed course for further information  Dispostion:  After consideration of the  diagnostic results and the patients response to treatment, I feel that the patent would benefit from close outpatient follow up      Final diagnoses:  Atrial fib/flutter, transient (HCC)  Chills  Other fatigue    ED Discharge Orders          Ordered    amoxicillin -clavulanate (AUGMENTIN ) 875-125 MG tablet  Every 12 hours        07/12/23 1438               Cottie Donnice PARAS, MD 07/12/23 1443

## 2023-07-13 ENCOUNTER — Telehealth: Payer: Self-pay

## 2023-07-13 NOTE — Telephone Encounter (Signed)
 Patient has back problems and is concerned about having to lay flat for the surgery. I advised that we would not have her laying on her back during the procedure, but that we would just tilt the chair. I also advised that she did not have to be sitting in the chair while we were waiting for her results since she has restless legs and would prefer not to sit for a long time.  She has general anxiety and has xanax , but was wondering if there was something we could give her. I advised t, that we do have an antianxiety medication that we could give her at the time of her appointment, but that she would need someone available to drive her. She said that at 10am she would not have anyone available to drive her, so she will just take her xanax  that she already has. She feels like without having the lay flat and being able to walk around she will be fine. I reassured her that we would do our best to keep her comfortable.

## 2023-07-14 LAB — SURGICAL PATHOLOGY

## 2023-07-17 LAB — CULTURE, BLOOD (ROUTINE X 2)
Culture: NO GROWTH
Culture: NO GROWTH
Special Requests: ADEQUATE
Special Requests: ADEQUATE

## 2023-07-20 DIAGNOSIS — R350 Frequency of micturition: Secondary | ICD-10-CM | POA: Diagnosis not present

## 2023-07-20 DIAGNOSIS — Z09 Encounter for follow-up examination after completed treatment for conditions other than malignant neoplasm: Secondary | ICD-10-CM | POA: Diagnosis not present

## 2023-07-20 DIAGNOSIS — G2581 Restless legs syndrome: Secondary | ICD-10-CM | POA: Diagnosis not present

## 2023-07-20 DIAGNOSIS — I4891 Unspecified atrial fibrillation: Secondary | ICD-10-CM | POA: Diagnosis not present

## 2023-07-20 DIAGNOSIS — G4700 Insomnia, unspecified: Secondary | ICD-10-CM | POA: Diagnosis not present

## 2023-07-20 DIAGNOSIS — Z87440 Personal history of urinary (tract) infections: Secondary | ICD-10-CM | POA: Diagnosis not present

## 2023-07-20 DIAGNOSIS — D649 Anemia, unspecified: Secondary | ICD-10-CM | POA: Diagnosis not present

## 2023-07-21 DIAGNOSIS — C652 Malignant neoplasm of left renal pelvis: Secondary | ICD-10-CM | POA: Diagnosis not present

## 2023-07-21 DIAGNOSIS — C678 Malignant neoplasm of overlapping sites of bladder: Secondary | ICD-10-CM | POA: Diagnosis not present

## 2023-07-22 ENCOUNTER — Ambulatory Visit: Admitting: Dermatology

## 2023-07-28 DIAGNOSIS — F331 Major depressive disorder, recurrent, moderate: Secondary | ICD-10-CM | POA: Diagnosis not present

## 2023-07-30 ENCOUNTER — Ambulatory Visit: Admitting: Dermatology

## 2023-07-30 ENCOUNTER — Encounter: Payer: Self-pay | Admitting: Dermatology

## 2023-07-30 VITALS — BP 138/68 | HR 54 | Temp 97.1°F

## 2023-07-30 DIAGNOSIS — L579 Skin changes due to chronic exposure to nonionizing radiation, unspecified: Secondary | ICD-10-CM | POA: Diagnosis not present

## 2023-07-30 DIAGNOSIS — C4492 Squamous cell carcinoma of skin, unspecified: Secondary | ICD-10-CM

## 2023-07-30 DIAGNOSIS — C44329 Squamous cell carcinoma of skin of other parts of face: Secondary | ICD-10-CM

## 2023-07-30 DIAGNOSIS — L814 Other melanin hyperpigmentation: Secondary | ICD-10-CM

## 2023-07-30 NOTE — Progress Notes (Unsigned)
   Follow-Up Visit   Subjective  Cynthia [name] is a 78 y.o. female who presents for the following: Mohs of right buccal cheek.  Patient reports that the lesion has been present for over 1 year. Positive for pain. Negative for bleeding.     The following portions of the chart were reviewed this encounter and updated as appropriate: medications, allergies, medical history  Review of Systems:  No other skin or systemic complaints except as noted in HPI or Assessment and Plan.  Objective  Well appearing patient in no apparent distress; mood and affect are within normal limits.  A focused examination was performed of the following areas: Right buccal cheek Relevant physical exam findings are noted in the Assessment and Plan.     Assessment & Plan      No follow-ups on file.  LILLETTE Rollene Gobble, RN, am acting as scribe for RUFUS CHRISTELLA HOLY, MD .   Documentation: I have reviewed the above documentation for accuracy and completeness, and I agree with the above.  RUFUS CHRISTELLA HOLY, MD

## 2023-07-30 NOTE — Patient Instructions (Signed)

## 2023-07-31 ENCOUNTER — Encounter: Payer: Self-pay | Admitting: *Deleted

## 2023-07-31 ENCOUNTER — Other Ambulatory Visit: Payer: Self-pay | Admitting: *Deleted

## 2023-07-31 ENCOUNTER — Encounter: Payer: Self-pay | Admitting: Dermatology

## 2023-07-31 DIAGNOSIS — D649 Anemia, unspecified: Secondary | ICD-10-CM

## 2023-07-31 NOTE — Progress Notes (Signed)
 Received faxed referral from [facility] for worsening anemia. Per chart review, does not appear a significant drop. Per Olam Ned, will see on 8/01 with labs. Labs ordered and scheduler notified.

## 2023-08-06 ENCOUNTER — Encounter: Payer: Self-pay | Admitting: Dermatology

## 2023-08-07 ENCOUNTER — Other Ambulatory Visit

## 2023-08-07 ENCOUNTER — Other Ambulatory Visit: Payer: Self-pay

## 2023-08-07 ENCOUNTER — Inpatient Hospital Stay: Attending: Oncology | Admitting: Oncology

## 2023-08-07 ENCOUNTER — Telehealth: Payer: Self-pay

## 2023-08-07 VITALS — BP 120/58 | HR 66 | Temp 98.2°F | Resp 16 | Ht 63.0 in | Wt 159.5 lb

## 2023-08-07 DIAGNOSIS — I4891 Unspecified atrial fibrillation: Secondary | ICD-10-CM | POA: Insufficient documentation

## 2023-08-07 DIAGNOSIS — D649 Anemia, unspecified: Secondary | ICD-10-CM | POA: Diagnosis not present

## 2023-08-07 DIAGNOSIS — Z905 Acquired absence of kidney: Secondary | ICD-10-CM | POA: Diagnosis not present

## 2023-08-07 DIAGNOSIS — I480 Paroxysmal atrial fibrillation: Secondary | ICD-10-CM

## 2023-08-07 DIAGNOSIS — R0602 Shortness of breath: Secondary | ICD-10-CM | POA: Insufficient documentation

## 2023-08-07 DIAGNOSIS — Q631 Lobulated, fused and horseshoe kidney: Secondary | ICD-10-CM | POA: Diagnosis not present

## 2023-08-07 DIAGNOSIS — R5383 Other fatigue: Secondary | ICD-10-CM | POA: Insufficient documentation

## 2023-08-07 DIAGNOSIS — E041 Nontoxic single thyroid nodule: Secondary | ICD-10-CM | POA: Diagnosis not present

## 2023-08-07 DIAGNOSIS — Z8553 Personal history of malignant neoplasm of renal pelvis: Secondary | ICD-10-CM | POA: Insufficient documentation

## 2023-08-07 DIAGNOSIS — Z853 Personal history of malignant neoplasm of breast: Secondary | ICD-10-CM | POA: Diagnosis not present

## 2023-08-07 LAB — RETICULOCYTES
Immature Retic Fract: 7.7 % (ref 2.3–15.9)
RBC.: 3.31 MIL/uL — ABNORMAL LOW (ref 3.87–5.11)
Retic Count, Absolute: 58.3 K/uL (ref 19.0–186.0)
Retic Ct Pct: 1.8 % (ref 0.4–3.1)

## 2023-08-07 LAB — CBC WITH DIFFERENTIAL (CANCER CENTER ONLY)
Abs Immature Granulocytes: 0.07 K/uL (ref 0.00–0.07)
Basophils Absolute: 0.1 K/uL (ref 0.0–0.1)
Basophils Relative: 1 %
Eosinophils Absolute: 0.1 K/uL (ref 0.0–0.5)
Eosinophils Relative: 4 %
HCT: 30.1 % — ABNORMAL LOW (ref 36.0–46.0)
Hemoglobin: 9.4 g/dL — ABNORMAL LOW (ref 12.0–15.0)
Immature Granulocytes: 2 %
Lymphocytes Relative: 43 %
Lymphs Abs: 1.7 K/uL (ref 0.7–4.0)
MCH: 28.1 pg (ref 26.0–34.0)
MCHC: 31.2 g/dL (ref 30.0–36.0)
MCV: 89.9 fL (ref 80.0–100.0)
Monocytes Absolute: 0.4 K/uL (ref 0.1–1.0)
Monocytes Relative: 9 %
Neutro Abs: 1.7 K/uL (ref 1.7–7.7)
Neutrophils Relative %: 41 %
Platelet Count: 159 K/uL (ref 150–400)
RBC: 3.35 MIL/uL — ABNORMAL LOW (ref 3.87–5.11)
RDW: 23.2 % — ABNORMAL HIGH (ref 11.5–15.5)
WBC Count: 4 K/uL (ref 4.0–10.5)
nRBC: 2 % — ABNORMAL HIGH (ref 0.0–0.2)

## 2023-08-07 LAB — CMP (CANCER CENTER ONLY)
ALT: 12 U/L (ref 0–44)
AST: 23 U/L (ref 15–41)
Albumin: 3.9 g/dL (ref 3.5–5.0)
Alkaline Phosphatase: 45 U/L (ref 38–126)
Anion gap: 12 (ref 5–15)
BUN: 24 mg/dL — ABNORMAL HIGH (ref 8–23)
CO2: 25 mmol/L (ref 22–32)
Calcium: 9.3 mg/dL (ref 8.9–10.3)
Chloride: 103 mmol/L (ref 98–111)
Creatinine: 1.28 mg/dL — ABNORMAL HIGH (ref 0.44–1.00)
GFR, Estimated: 42 mL/min — ABNORMAL LOW (ref >60)
Glucose, Bld: 112 mg/dL — ABNORMAL HIGH (ref 70–99)
Potassium: 4.3 mmol/L (ref 3.5–5.1)
Sodium: 140 mmol/L (ref 135–145)
Total Bilirubin: 0.6 mg/dL (ref 0.0–1.2)
Total Protein: 6.6 g/dL (ref 6.5–8.1)

## 2023-08-07 LAB — LACTATE DEHYDROGENASE: LDH: 181 U/L (ref 98–192)

## 2023-08-07 LAB — SAVE SMEAR(SSMR), FOR PROVIDER SLIDE REVIEW

## 2023-08-07 NOTE — Telephone Encounter (Signed)
 Called pt and went over CT/Ablation instructions with her. She is scheduled for Afib Ablation with Dr. Kennyth on 8/27 at 11:00 am.  CT is scheduled for 8/6 at 2:00 PM.  She will get labs done at [facility] on 8/1 - Oncology is also doing some labs so I told her to make sure she lets them know to do both.  I informed her that I would mail her Instruction letters and also they would be in her MyChart for her to review and call back with any questions or concerns.

## 2023-08-07 NOTE — Progress Notes (Signed)
 Cynthia [name] OFFICE PROGRESS NOTE   Diagnosis: History of breast cancer, bladder cancer, anemia  INTERVAL HISTORY:   Cynthia [name] is referred for evaluation of anemia.  A CBC at [name] on 07/20/2023 found the hemoglobin of 9.2, hematocrit 27.6%, MCV 84.7, platelets 150,000, WBC 5.5, and absolute neutrophil count 2.8.  The ferritin returned at 260.5 with a percent transferrin saturation of 18 and a TIBC of 431.  The vitamin B12 returned at 272. She reports chills lasting for up to 15 hours with low-grade fever occurring intermittently a few months ago.  She reports no fever or chills for the past few months.  No recent infection.  Good appetite.  She has exertional dyspnea.  She reports malaise.  She is scheduled for a cardiac ablation procedure later this month. 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  Dr. Alvaro performed transurethral resection of a bladder tumor on 07/08/2023.  Cynthia [name] continues apixaban  anticoagulation.  She denies bleeding. Objective:  Vital signs in last 24 hours:  Blood pressure (!) 120/58, pulse 66, temperature 98.2 F (36.8 C), temperature source Temporal, resp. rate 16, height 5' 3 (1.6 m), weight 159 lb 8 oz (72.3 kg), SpO2 100%.    Lymphatics: No cervical, supraclavicular, axillary, or inguinal nodes Resp: Lungs clear bilaterally Cardio: Regular rate and rhythm GI: No hepatosplenomegaly, right abdominal wall hernia Vascular: No leg edema  Skin: Healing right face incision   Lab Results:  Lab Results  Component Value Date   WBC 14.3 (H) 07/12/2023   HGB 9.9 (L) 07/12/2023   HCT 31.0 (L) 07/12/2023   MCV 87.6 07/12/2023   PLT 166 07/12/2023   NEUTROABS 2.9 09/12/2022  Blood smear: The platelets appear normal in number, few large and giant platelets.  There are hypogranular and hypolobated neutrophils.  No blasts or other young forms are seen.  There is marked variation in Red cell size  and shape.  Numerous acanthocytes and ovalocytes.  Few teardrops.  Rare nucleated red cell.  The polychromasia is not increased.  CMP  Lab Results  Component Value Date   NA 136 07/12/2023   K 3.6 07/12/2023   CL 99 07/12/2023   CO2 27 07/12/2023   GLUCOSE 146 (H) 07/12/2023   BUN 20 07/12/2023   CREATININE 1.21 (H) 07/12/2023   CALCIUM  9.6 07/12/2023   PROT 6.1 (L) 09/12/2022   ALBUMIN  3.7 09/12/2022   AST 30 09/12/2022   ALT 15 09/12/2022   ALKPHOS 33 (L) 09/12/2022   BILITOT 0.6 09/12/2022   GFRNONAA 45 (L) 07/12/2023   GFRAA 49 (L) 09/22/2019    No results found for: CEA1, CEA, CAN199, CA125  Lab Results  Component Value Date   INR 1.5 (H) 08/31/2021   LABPROT 17.7 (H) 08/31/2021    Imaging:  No results found.  Medications: I have reviewed the patient's current medications.   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, status post a left nephro ureterectomy 04/27/2019 Tumor extends to the muscularis, pT2, pN0 May 2023-recurrence at the left trigone area on surveillance cystoscopy-TURBT-invasive high-grade papillary urothelial carcinoma invading muscularis propria PET 07/29/2021-prior left nephro ureterectomy and TURBT, no evidence of metastatic disease Neoadjuvant gemcitabine /carboplatin  for 4 cycles 07/17/2021-September 2023 PET 10/24/2021-no evidence of hypermetabolic metastatic disease, focal hypermetabolism in the sigmoid colon with associated diverticular wall thickening, hypermetabolic 1.8 cm left thyroid  hyperdense nodule-unchanged CTs 10/31/2022-stable changes from left nephro ureterectomy, no evidence of recurrent disease, stable hyperdense thyroid  nodule  07/08/2023 transurethral resection of recurrent bladder tumor at the left trigone, bladder neck, and proximal urethra-high-grade papillary carcinoma with foci of superficial invasion, scant muscularis present-not involved  2.  Congenital left horseshoe kidney 3.   Right breast cancer, status post right lumpectomy and sentinel lymph node biopsy 04/01/2016-pT1bpN0, status post a right lumpectomy, adjuvant radiation, and 5 years of anastrozole  (started 07/15/2016)  4.  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  Diabetes 6.  Gastroesophageal reflux disease 7.  IBS 8.  History of atrial fibrillation/flutter 9.  History of NSVT 10.  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  Anemia     Disposition: Cynthia [name] has a complex medical history including a history of urothelial carcinoma, status post recent resection of a recurrent bladder tumor.  She also has a history of atrial fibrillation, squamous cell carcinoma of the right face, and breast cancer.  She is referred today for evaluation of anemia.  There is no obvious explanation for the anemia, though the peripheral blood smear findings suggest a bone marrow process, potentially myelodysplasia.  She will be referred for a diagnostic bone marrow biopsy and return for an office visit in approximately 3 weeks.  The differential diagnosis includes a lymphoproliferative disorder and metastatic carcinoma involving the bone marrow.  Arley Hof, MD  08/07/2023  11:42 AM

## 2023-08-10 LAB — KAPPA/LAMBDA LIGHT CHAINS
Kappa free light chain: 31.2 mg/L — ABNORMAL HIGH (ref 3.3–19.4)
Kappa, lambda light chain ratio: 1.39 (ref 0.26–1.65)
Lambda free light chains: 22.4 mg/L (ref 5.7–26.3)

## 2023-08-11 ENCOUNTER — Telehealth: Payer: Self-pay | Admitting: *Deleted

## 2023-08-11 ENCOUNTER — Telehealth: Payer: Self-pay | Admitting: Cardiology

## 2023-08-11 LAB — MULTIPLE MYELOMA PANEL, SERUM
Albumin SerPl Elph-Mcnc: 3.8 g/dL (ref 2.9–4.4)
Albumin/Glob SerPl: 1.4 (ref 0.7–1.7)
Alpha 1: 0.3 g/dL (ref 0.0–0.4)
Alpha2 Glob SerPl Elph-Mcnc: 0.4 g/dL (ref 0.4–1.0)
B-Globulin SerPl Elph-Mcnc: 1.1 g/dL (ref 0.7–1.3)
Gamma Glob SerPl Elph-Mcnc: 1 g/dL (ref 0.4–1.8)
Globulin, Total: 2.8 g/dL (ref 2.2–3.9)
IgA: 444 mg/dL — ABNORMAL HIGH (ref 64–422)
IgG (Immunoglobin G), Serum: 1159 mg/dL (ref 586–1602)
IgM (Immunoglobulin M), Srm: 84 mg/dL (ref 26–217)
Total Protein ELP: 6.6 g/dL (ref 6.0–8.5)

## 2023-08-11 NOTE — Telephone Encounter (Signed)
 Called to inquire what type of sedation she will have for bone marrow biopsy and if it will be OK to have this a few days prior to her general anesthesia for cardiac ablation. OK per Dr. Cloretta. Informed patient that the bone marrow biopsy is done with light sedation w/Versed  and Fentanyl  and she will be fine to have general for her cardiac procedure. She expressed relief and agrees to proceed.

## 2023-08-11 NOTE — Telephone Encounter (Signed)
 Pt called in asking if its safe for her to have a CT bone marrow biopsy 8/22 prior to her ablation. The biopsy is scheduled at [facility]. She states they are not requesting clearance, she is asking for her own knowledge.

## 2023-08-12 ENCOUNTER — Ambulatory Visit (HOSPITAL_COMMUNITY)
Admission: RE | Admit: 2023-08-12 | Discharge: 2023-08-12 | Disposition: A | Discharge status: Home / Self Care | Source: Ambulatory Visit | Attending: Cardiovascular Disease | Admitting: Cardiovascular Disease

## 2023-08-12 DIAGNOSIS — I480 Paroxysmal atrial fibrillation: Secondary | ICD-10-CM | POA: Diagnosis not present

## 2023-08-12 DIAGNOSIS — I517 Cardiomegaly: Secondary | ICD-10-CM | POA: Diagnosis not present

## 2023-08-12 MED ORDER — IOHEXOL 350 MG/ML SOLN
80.0000 mL | Freq: Once | INTRAVENOUS | Status: AC | PRN
  Administered 2023-08-12: 80 mL via INTRAVENOUS

## 2023-08-13 ENCOUNTER — Encounter: Payer: Self-pay | Admitting: Oncology

## 2023-08-13 NOTE — Telephone Encounter (Signed)
 Left VM today reporting that her bone marrow biopsy is on 8/22 and OV is on 8/25. Will they have results by then?

## 2023-08-14 ENCOUNTER — Telehealth: Payer: Self-pay | Admitting: Nurse Practitioner

## 2023-08-14 NOTE — Telephone Encounter (Signed)
 Per Dr. Cloretta: Schedule 30 minute OV with him on 8/27 at 12:00 or with NP that day. Cancel the appointments on 8/25 please. Scheduling message sent.

## 2023-08-14 NOTE — Telephone Encounter (Signed)
 Called PT about rescheduled appt. Left voice message.

## 2023-08-17 ENCOUNTER — Telehealth: Payer: Self-pay | Admitting: Oncology

## 2023-08-17 NOTE — Telephone Encounter (Signed)
 Returning Phone call from Cynthia [name] to confirm new appt date and time.

## 2023-08-27 ENCOUNTER — Ambulatory Visit: Admitting: Dermatology

## 2023-08-27 ENCOUNTER — Other Ambulatory Visit: Payer: Self-pay | Admitting: Radiology

## 2023-08-27 ENCOUNTER — Encounter: Payer: Self-pay | Admitting: Dermatology

## 2023-08-27 VITALS — BP 142/69 | HR 57

## 2023-08-27 DIAGNOSIS — Z85828 Personal history of other malignant neoplasm of skin: Secondary | ICD-10-CM | POA: Diagnosis not present

## 2023-08-27 DIAGNOSIS — D649 Anemia, unspecified: Secondary | ICD-10-CM

## 2023-08-27 DIAGNOSIS — L905 Scar conditions and fibrosis of skin: Secondary | ICD-10-CM | POA: Diagnosis not present

## 2023-08-27 DIAGNOSIS — C4492 Squamous cell carcinoma of skin, unspecified: Secondary | ICD-10-CM

## 2023-08-27 NOTE — Patient Instructions (Signed)

## 2023-08-27 NOTE — Progress Notes (Unsigned)
   Follow Up Visit   Subjective  Cynthia [name] is a 78 y.o. female who presents for the following: follow up from Mohs surgery   The patient presents for follow up from Mohs surgery for a SCC on the right buccal cheek, treated on 07/30/23, repaired with linear repair. The patient has been bandaging the wound as directed. The endorse the following concerns: when can she use make up and what will her permanent scar look like.  The following portions of the chart were reviewed this encounter and updated as appropriate: medications, allergies, medical history  Review of Systems:  No other skin or systemic complaints except as noted in HPI or Assessment and Plan.  Objective  Well appearing patient in no apparent distress; mood and affect are within normal limits.  A focal examination was performed including scalp, head, face and cheek. All findings within normal limits unless otherwise noted below.  Healing wound with mild erythema  Relevant physical exam findings are noted in the Assessment and Plan.    Assessment & Plan   Scar s/p Mohs for SCC, treated on 07/30/23, repaired with linear repair - Reassured that wound is healing well - No evidence of infection - No swelling, induration, purulence, dehiscence, or tenderness out of proportion to the clinical exam, see photo above - Discussed that scars take up to 12 months to mature from the date of surgery - Recommend SPF 30+ to scar daily to prevent purple color from UV exposure during scar maturation process - Discussed that erythema and raised appearance of scar will fade over the next 4-6 months - OK to start scar massage at 4-6 weeks post-op - Can consider silicone based products for scar healing starting at 6 weeks post-op - Ok to discontinue ointment daily to wound  HISTORY OF SQUAMOUS CELL CARCINOMA OF THE SKIN - No evidence of recurrence today - No lymphadenopathy - Recommend regular full body skin exams - Recommend daily broad  spectrum sunscreen SPF 30+ to sun-exposed areas, reapply every 2 hours as needed.  - Call if any new or changing lesions are noted between office visits      Return in about 4 months (around 12/27/2023) for [facility]SC.  I, Berwyn Lesches, Surg Tech III, am acting as scribe for RUFUS CHRISTELLA HOLY, MD.   Documentation: I have reviewed the above documentation for accuracy and completeness, and I agree with the above.  RUFUS CHRISTELLA HOLY, MD

## 2023-08-27 NOTE — H&P (Signed)
 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  Referring Provider(s): Sherrill,B  Supervising Physician: Vanice Revel  Patient Status: [name] - Out-pt  History of Present Illness: Cynthia [name] is an 78 y.o. 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  She is scheduled today for image guided bone marrow biopsy for further evaluation/rule out myelodysplasia/disease involving bone marrow.  *** Patient is Full Code  Past Medical History:  Diagnosis Date   Anemia    Anticoagulant long-term use    eliquis --- managed by cardiology   Anxiety    Arthritis    Bladder cancer (HCC)    Bradycardia    Breast cancer (HCC)    left, s/p lumpectomy, Dr Layla   Cancer of kidney [facility])    left   DDD (degenerative disc disease), lumbar    Depression    Dyspnea    ON EXERTION   First degree heart block    Genetic testing 06/25/2016   Ms. Takeda underwent genetic counseling and testing for hereditary cancer syndromes on 06/17/2016. Her results were negative for mutations in all 46 genes analyzed by [facility]'s 46-gene Common Hereditary Cancers Panel. Genes analyzed include: APC, ATM, AXIN2, BARD1, BMPR1A, BRCA1, BRCA2, BRIP1, CDH1, CDKN2A, CHEK2, CTNNA1, DICER1, EPCAM, GREM1, HOXB13, KIT, MEN1,  MLH1, MSH2, MSH3, MSH6, MUTYH, NBN,   GERD (gastroesophageal reflux disease)    occasional,  takes pepcid    Hematuria    History of radiation therapy 05/22/2016 to 06/20/2016   right breast cancer   Hyperlipidemia    Hypertension    followd by pcp---  (02-22-2019 per pt never had stress test)   Hypothyroidism    followed by pcp   IBS (irritable bowel syndrome)    Incomplete right bundle branch block    Insomnia    Malignant neoplasm of upper-inner quadrant of right breast in female, estrogen receptor positive [facility]) oncologist--- dr layla   dx 03/ 2018,  Stage IA, IDC, ER/PR +;  04-01-2016 s/p  right breast lumpectomy w/ node dissection's;  completed radiation 06-20-2016   MVP (mitral valve prolapse)    per echo 12-11-2018 in epic, mild    Neuromuscular disorder (HCC)    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   NSVT (nonsustained ventricular tachycardia) (HCC) followed by cardiology   12-10-2018  hospital admission ,  refer to discharge note 12-14-2018 for treatement   PAF (paroxysmal atrial fibrillation) [facility]) primary cardiologist--- dr loni   newly dx 12-10-2018  admission in epic, Afib w/ RVR and NSVT;   in epic TTE 12-11-2018 showed ef 50-55%, mild MVP,  mild AV sclerosis without stenosis;   Cardiac MRI 12-13-2018 in epic   Pneumonia    Pre-diabetes    Prediabetes    Renal mass, left    pelvis    Restless leg syndrome    occasional   RLS (restless legs syndrome)    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   right buccal  cheek tx mohs 07/30/2023    Past Surgical History:  Procedure Laterality Date   BREAST LUMPECTOMY WITH RADIOACTIVE SEED AND SENTINEL LYMPH NODE BIOPSY Right 04/01/2016   Procedure: RADIOACTIVE SEED GUIDED RIGHT BREAST LUMPECTOMY WITH RIGHT AXILLARY SENTINEL LYMPH NODE BIOPSY.;  Surgeon: Elon Pacini, MD;  Location: [facility] OR;  Service: General;  Laterality: Right;   CARDIAC CATHETERIZATION  12/11/2018   CATARACT EXTRACTION W/ INTRAOCULAR LENS  IMPLANT,  BILATERAL  1980s   COLONOSCOPY     CYSTOSCOPY W/ RETROGRADES Right 06/05/2021   Procedure: CYSTOSCOPY WITH RETROGRADE PYELOGRAM;  Surgeon: Alvaro Hummer, MD;  Location: [facility] ORS;  Service: Urology;  Laterality: Right;   CYSTOSCOPY W/ RETROGRADES Right 07/08/2023   Procedure: CYSTOSCOPY, WITH RETROGRADE PYELOGRAM;  Surgeon: Alvaro Hummer KATHEE Mickey., MD;  Location: [facility];  Service: Urology;  Laterality: Right;   CYSTOSCOPY/RETROGRADE/URETEROSCOPY N/A 03/02/2019   Procedure: CYSTOSCOPY/LEFT RETROGRADE/URETEROSCOPY/  BIOPSY/  STENT;  Surgeon: Devere Lonni Righter, MD;  [facility];  Service: Urology;  Laterality: N/A;   INCISIONAL HERNIA REPAIR N/A 03/29/2020   Procedure: REPAIR OF INCISIONAL HERNIA WITH MESH;  Surgeon: Vanderbilt Ned, MD;  Location: [facility] OR;  Service: General;  Laterality: N/A;   IR IMAGING GUIDED PORT INSERTION  07/18/2021   IR REMOVAL TUN ACCESS W/ PORT W/O FL MOD SED  11/26/2022   LAPAROSCOPIC CHOLECYSTECTOMY  1990s   LOOP RECORDER INSERTION     Left upper chest, now at EOL, will replace when Ablation is done   ROBOT ASSITED LAPAROSCOPIC NEPHROURETERECTOMY Left 04/27/2019   Procedure: XI ROBOT ASSITED LAPAROSCOPIC NEPHROURETERECTOMY;  Surgeon: Alvaro Hummer, MD;  Location: [facility];  Service: Urology;  Laterality: Left;  3.5 HRS   TONSILLECTOMY  age 56   TRANSURETHRAL RESECTION OF BLADDER TUMOR N/A 06/05/2021   Procedure: TRANSURETHRAL RESECTION OF BLADDER TUMOR (TURBT);  Surgeon: Alvaro Hummer, MD;  Location: [facility];  Service: Urology;  Laterality: N/A;   TRANSURETHRAL RESECTION OF BLADDER TUMOR Right 07/08/2023   Procedure: TURBT (TRANSURETHRAL RESECTION OF BLADDER TUMOR);  Surgeon: Alvaro Hummer KATHEE Mickey., MD;  Location: [facility];  Service: Urology;  Laterality: Right;    Allergies: Patient has no known allergies.  Medications: Prior to Admission medications   Medication Sig Start Date End Date Taking? Authorizing Provider  acetaminophen  (TYLENOL )  650 MG CR tablet Take 1,300 mg by mouth at bedtime. Patient taking differently: Take 1,300 mg by mouth 2 (two) times daily.    [provider]  ALPRAZolam  (XANAX ) 0.5 MG tablet Take 0.5-1 mg by mouth See admin instructions. Take 2 tablets (1 mg) by mouth scheduled every night at bedtime, may take an additional dose (0.5 mg) in 4 hours if still unable to sleep. 04/04/21   [provider]  amiodarone  (PACERONE ) 200 MG tablet Take 1 tablet (200 mg total) by mouth daily. 06/26/23   Kennyth Chew, MD  carboxymethylcellulose (REFRESH PLUS) 0.5 % SOLN Place 1 drop into both eyes in the morning and at bedtime.    [provider]  Cholecalciferol  (VITAMIN D -3) 25 MCG (1000 UT) CAPS Take 2,000 Units by mouth daily with breakfast. Patient taking differently: Take 2,000 Units by mouth every other day.    [provider]  Choline Fenofibrate  (FENOFIBRIC ACID) 135 MG CPDR Take 135 mg by mouth daily. 01/16/16   [provider]  ELIQUIS  5 MG TABS tablet TAKE 1 TABLET BY MOUTH TWICE A DAY 04/13/23   Fernande Elspeth BROCKS, MD  ferrous sulfate  325 (65 FE) MG tablet Take  325 mg by mouth at bedtime.    [provider]  gabapentin  (NEURONTIN ) 100 MG capsule Take 100 mg by mouth at bedtime. 03/26/22   [provider]  levothyroxine  (SYNTHROID , LEVOTHROID) 112 MCG tablet Take 112 mcg by mouth daily. 03/13/16   [provider]  MAGNESIUM  PO Take 250 mg by mouth every evening. Patient taking differently: Take 250 mg by mouth every morning.    [provider]  omeprazole (PRILOSEC) 40 MG capsule Take 40 mg by mouth daily before breakfast.    [provider]  traMADol  (ULTRAM ) 50 MG tablet Take 1 tablet (50 mg total) by mouth every 6 (six) hours as needed for moderate pain (pain score 4-6) or severe pain (pain score 7-10) (post-operatively). Patient not taking: Reported on 08/07/2023 07/08/23 07/07/24  Alvaro Ricardo KATHEE Mickey., MD  traZODone  (DESYREL ) 100 MG  tablet Take 50 mg by mouth at bedtime.    [provider]     Family History  Problem Relation Age of Onset   Congestive Heart Failure Mother    Breast cancer Sister 42       d.54   Cervical cancer Sister 18   Leukemia Brother 43   Leukemia Son    Ovarian cancer Maternal Aunt 11       d.92s   Breast cancer Other 45   Restless legs syndrome Neg Hx    Sleep apnea Neg Hx     Social History   Socioeconomic History   Marital status: Widowed    Spouse name: Not on file   Number of children: Not on file   Years of education: Not on file   Highest education level: Not on file  Occupational History   Not on file  Tobacco Use   Smoking status: Never   Smokeless tobacco: Never  Vaping Use   Vaping status: Never Used  Substance and Sexual Activity   Alcohol use: Not Currently    Comment: 4 or 5 mixed drinks per year   Drug use: Never   Sexual activity: Not Currently    Birth control/protection: Post-menopausal  Other Topics Concern   Not on file  Social History Narrative   Caffeine: max of 1-1.5 cokes per day    Right handed   Social Drivers of Health   Financial Resource Strain: Not on file  Food Insecurity: No Food Insecurity (02/13/2022)   Hunger Vital Sign    Worried About Running Out of Food in the Last Year: Never true    Ran Out of Food in the Last Year: Never true  Transportation Needs: No Transportation Needs (02/13/2022)   PRAPARE - Administrator, Civil Service (Medical): No    Lack of Transportation (Non-Medical): No  Physical Activity: Not on file  Stress: Not on file  Social Connections: Unknown (05/21/2021)   Received from [facility]   Social Network    Social Network: Not on file       Review of Systems  Vital Signs:   Advance Care Plan: no documents on file  Physical Exam  Imaging: CT CARDIAC MORPH/PULM VEIN W/CM&W/O CA SCORE Addendum Date: 08/22/2023 ADDENDUM REPORT: 08/22/2023 11:25 EXAM: OVER-READ INTERPRETATION   CT CHEST The following report is an over-read performed by radiologist Dr. Andrea Gasman of [facility] on 08/22/2023. This over-read does not include interpretation of cardiac or coronary anatomy or pathology. The coronary CTA interpretation by the cardiologist is attached. COMPARISON:  Chest CT 10/31/2022 FINDINGS: Vascular: Aortic atherosclerosis.  The included aorta is normal in caliber. The descending aorta is tortuous. Mediastinum/nodes: No adenopathy or mass. Unremarkable esophagus. Lungs: Post radiation changes in the anterior right lung. Benign calcified granulomas in the left lower lobe. Unchanged 5 mm right lower lobe nodule series 309, image 32, stable from 2023 and considered definitively benign. No focal airspace disease. No pleural fluid. The included airways are patent. Upper abdomen: No acute or unexpected findings. Musculoskeletal: There are no acute or suspicious osseous abnormalities. Degenerative change in the spine. Postsurgical change in the right breast. IMPRESSION: 1. Post radiation changes in the anterior right lung. 2. Aortic Atherosclerosis (ICD10-I70.0). Electronically Signed   By: Andrea Gasman M.D.   On: 08/22/2023 11:25   Result Date: 08/22/2023 CLINICAL DATA:  Atrial fibrillation scheduled for ablation. EXAM: Cardiac CTA TECHNIQUE: A non-contrast, gated CT scan was obtained with axial slices of 2.5 mm through the heart for calcium  scoring. Calcium  scoring was performed using the Agatston method. A 100 kV prospective, gated, contrast cardiac scan was obtained. Gantry rotation speed was 230 msec and collimation was 0.63 mm. Nitroglycerin was not given. A delayed scan was obtained to exclude left atrial appendage thrombus. The 3D dataset was reconstructed in systole with motion correction. The 3D dataset was reconstructed at 5% intervals of the 25%-50% of the R-R cycle. Images were analyzed on a dedicated workstation using MPR, MIP, and VRT modes. The patient received  95 cc of contrast. FINDINGS: Image quality: Excellent. Noise artifact is: Limited. Pulmonary Veins: There is normal pulmonary vein drainage into the left atrium (2 on the right and 2 on the left). Left Atrium: The left atrial size is dilated. There is no PFO/ASD. The left atrial appendage is large chicken wing without thrombus on delayed imaging. The esophagus runs close in proximity to the left inferior pulmonary vein ostium. Coronary Arteries: CAC score of 1557, which is 95th percentile for age-, race-, and sex-matched controls. Normal coronary origin. Right dominance. The study was performed without use of NTG and is insufficient for plaque evaluation. Right Atrium: Right atrial size is dilated. Right Ventricle: The right ventricular cavity is within normal limits. Left Ventricle: The ventricular cavity size is within normal limits. Pericardium: Normal thickness without significant effusion or calcium  present. Pulmonary Artery: Dilated pulmonary artery suggestive of pulmonary hypertension. Cardiac valves: The aortic valve is trileaflet with mild calcification. The mitral valve is myxomatous. Aorta: Normal caliber with no significant disease. Extra-cardiac findings: See attached radiology report for non-cardiac structures. IMPRESSION: 1. There is normal pulmonary vein drainage into the left atrium. 2. There is no thrombus in the left atrial appendage. 3. The esophagus runs close in proximity to the left inferior pulmonary vein ostium. 4. No PFO/ASD. 5. CAC score of 1557, which is 95th percentile for age-, race-, and sex-matched controls. Consider nuclear stress testing. 6. Dilated pulmonary artery suggestive of pulmonary hypertension. Darryle Decent, MD Electronically Signed: By: Darryle Decent M.D. On: 08/13/2023 12:15    Labs:  CBC: Recent Labs    09/12/22 0316 07/12/23 1255 08/07/23 1122  WBC 5.6 14.3* 4.0  HGB 10.4* 9.9* 9.4*  HCT 32.2* 31.0* 30.1*  PLT 164 166 159    COAGS: No results for  input(s): INR, APTT in the last 8760 hours.  BMP: Recent Labs    09/12/22 0316 07/12/23 1255 08/07/23 1324  NA 138 136 140  K 3.9 3.6 4.3  CL 101 99 103  CO2 29 27 25   GLUCOSE 79 146* 112*  BUN 18 20 24*  CALCIUM  8.6* 9.6 9.3  CREATININE 0.94 1.21* 1.28*  GFRNONAA >60 45* 42*    LIVER FUNCTION TESTS: Recent Labs    09/12/22 0316 08/07/23 1324  BILITOT 0.6 0.6  AST 30 23  ALT 15 12  ALKPHOS 33* 45  PROT 6.1* 6.6  ALBUMIN  3.7 3.9    TUMOR MARKERS: No results for input(s): AFPTM, CEA, CA199, CHROMGRNA in the last 8760 hours.  Assessment and Plan: 78 y.o. 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  She is scheduled today for image guided bone marrow biopsy for further evaluation/rule out myelodysplasia/disease involving bone marrow.Risks and benefits of procedure  was discussed with the patient and/or patient's family including, but not limited to bleeding, infection, damage to adjacent structures or low yield requiring additional tests.  All of the questions were answered and there is agreement to proceed.  Consent signed and in chart.  She is known to IR team from Port-A-Cath placement in 2023 with removal in 2024   Thank you for allowing our service to participate in Yarelin Reichardt 's care.  Electronically Signed: D. Franky Rakers, PA-C   08/27/2023, 12:13 PM      I spent a total of  20 minutes   in face to face in clinical consultation,  greater than 50% of which was counseling/coordinating care for image guided bone marrow biopsy

## 2023-08-28 ENCOUNTER — Ambulatory Visit (HOSPITAL_COMMUNITY)
Admission: RE | Admit: 2023-08-28 | Discharge: 2023-08-28 | Disposition: A | Discharge status: Home / Self Care | Source: Ambulatory Visit

## 2023-08-28 ENCOUNTER — Encounter (HOSPITAL_COMMUNITY): Payer: Self-pay

## 2023-08-28 ENCOUNTER — Other Ambulatory Visit: Payer: Self-pay

## 2023-08-28 ENCOUNTER — Ambulatory Visit (HOSPITAL_COMMUNITY)
Admission: RE | Admit: 2023-08-28 | Discharge: 2023-08-28 | Disposition: A | Discharge status: Home / Self Care | Source: Ambulatory Visit | Attending: Oncology | Admitting: Oncology

## 2023-08-28 DIAGNOSIS — Z85828 Personal history of other malignant neoplasm of skin: Secondary | ICD-10-CM | POA: Insufficient documentation

## 2023-08-28 DIAGNOSIS — I480 Paroxysmal atrial fibrillation: Secondary | ICD-10-CM | POA: Diagnosis not present

## 2023-08-28 DIAGNOSIS — D649 Anemia, unspecified: Secondary | ICD-10-CM

## 2023-08-28 DIAGNOSIS — E119 Type 2 diabetes mellitus without complications: Secondary | ICD-10-CM | POA: Insufficient documentation

## 2023-08-28 DIAGNOSIS — D469 Myelodysplastic syndrome, unspecified: Secondary | ICD-10-CM | POA: Insufficient documentation

## 2023-08-28 DIAGNOSIS — Q631 Lobulated, fused and horseshoe kidney: Secondary | ICD-10-CM | POA: Insufficient documentation

## 2023-08-28 DIAGNOSIS — D61818 Other pancytopenia: Secondary | ICD-10-CM | POA: Diagnosis not present

## 2023-08-28 DIAGNOSIS — E039 Hypothyroidism, unspecified: Secondary | ICD-10-CM | POA: Diagnosis not present

## 2023-08-28 DIAGNOSIS — I10 Essential (primary) hypertension: Secondary | ICD-10-CM | POA: Diagnosis not present

## 2023-08-28 DIAGNOSIS — K219 Gastro-esophageal reflux disease without esophagitis: Secondary | ICD-10-CM | POA: Diagnosis not present

## 2023-08-28 DIAGNOSIS — C946 Myelodysplastic disease, not classified: Secondary | ICD-10-CM | POA: Diagnosis not present

## 2023-08-28 LAB — CBC WITH DIFFERENTIAL/PLATELET
Abs Granulocyte: 1.6 K/uL (ref 1.5–6.5)
Abs Immature Granulocytes: 0.25 K/uL — ABNORMAL HIGH (ref 0.00–0.07)
Basophils Absolute: 0.1 K/uL (ref 0.0–0.1)
Basophils Relative: 1 %
Eosinophils Absolute: 0.1 K/uL (ref 0.0–0.5)
Eosinophils Relative: 3 %
HCT: 28 % — ABNORMAL LOW (ref 36.0–46.0)
Hemoglobin: 8.4 g/dL — ABNORMAL LOW (ref 12.0–15.0)
Immature Granulocytes: 6 %
Lymphocytes Relative: 38 %
Lymphs Abs: 1.5 K/uL (ref 0.7–4.0)
MCH: 27.4 pg (ref 26.0–34.0)
MCHC: 30 g/dL (ref 30.0–36.0)
MCV: 91.2 fL (ref 80.0–100.0)
Monocytes Absolute: 0.4 K/uL (ref 0.1–1.0)
Monocytes Relative: 11 %
Neutro Abs: 1.6 K/uL — ABNORMAL LOW (ref 1.7–7.7)
Neutrophils Relative %: 41 %
Platelets: 115 K/uL — ABNORMAL LOW (ref 150–400)
RBC: 3.07 MIL/uL — ABNORMAL LOW (ref 3.87–5.11)
RDW: 22.9 % — ABNORMAL HIGH (ref 11.5–15.5)
Smear Review: NORMAL
WBC: 3.9 K/uL — ABNORMAL LOW (ref 4.0–10.5)
nRBC: 1 % — ABNORMAL HIGH (ref 0.0–0.2)

## 2023-08-28 MED ORDER — MIDAZOLAM HCL 2 MG/2ML IJ SOLN
INTRAMUSCULAR | Status: AC | PRN
Start: 2023-08-28 — End: 2023-08-28
  Administered 2023-08-28: 1 mg via INTRAVENOUS

## 2023-08-28 MED ORDER — SODIUM CHLORIDE 0.9 % IV SOLN: INTRAVENOUS | Status: DC

## 2023-08-28 MED ORDER — FENTANYL CITRATE (PF) 100 MCG/2ML IJ SOLN
INTRAMUSCULAR | Status: AC | PRN
  Administered 2023-08-28: 50 ug via INTRAVENOUS

## 2023-08-28 MED ORDER — MIDAZOLAM HCL 2 MG/2ML IJ SOLN
INTRAMUSCULAR | Status: AC
  Filled 2023-08-28: qty 2

## 2023-08-28 MED ORDER — MIDAZOLAM HCL 2 MG/2ML IJ SOLN
INTRAMUSCULAR | Status: AC | PRN
  Administered 2023-08-28: 1 mg via INTRAVENOUS

## 2023-08-28 MED ORDER — FENTANYL CITRATE (PF) 100 MCG/2ML IJ SOLN
INTRAMUSCULAR | Status: AC
  Filled 2023-08-28: qty 2

## 2023-08-28 NOTE — Discharge Instructions (Signed)

## 2023-08-28 NOTE — Procedures (Signed)
Interventional Radiology Procedure Note  Procedure: CT BM ASP AND CORE    Complications: None  Estimated Blood Loss:  MIN  Findings: 11 G CORE AND ASP    M. TREVOR Chrstopher Malenfant, MD    

## 2023-08-28 NOTE — Sedation Documentation (Signed)
 RN Sonam Wandel pulled 2 mg Versed  and 100 mcg Fentanyl  in IR room. Pt. Received 2 mg Versed  and 50 mcg Fentanyl  throughout the procedure. Wsted 50mcg Fentanyl  with Licensed conveyancer.

## 2023-08-31 ENCOUNTER — Other Ambulatory Visit: Admitting: Oncology

## 2023-08-31 ENCOUNTER — Other Ambulatory Visit

## 2023-09-02 ENCOUNTER — Inpatient Hospital Stay: Admitting: Nurse Practitioner

## 2023-09-02 ENCOUNTER — Encounter: Payer: Self-pay | Admitting: *Deleted

## 2023-09-02 ENCOUNTER — Encounter: Payer: Self-pay | Admitting: Nurse Practitioner

## 2023-09-02 ENCOUNTER — Inpatient Hospital Stay

## 2023-09-02 VITALS — BP 112/71 | HR 97 | Temp 97.8°F | Resp 18 | Ht 63.0 in | Wt 161.2 lb

## 2023-09-02 DIAGNOSIS — Q631 Lobulated, fused and horseshoe kidney: Secondary | ICD-10-CM | POA: Diagnosis not present

## 2023-09-02 DIAGNOSIS — D469 Myelodysplastic syndrome, unspecified: Secondary | ICD-10-CM | POA: Diagnosis not present

## 2023-09-02 DIAGNOSIS — I4891 Unspecified atrial fibrillation: Secondary | ICD-10-CM | POA: Diagnosis not present

## 2023-09-02 DIAGNOSIS — Z905 Acquired absence of kidney: Secondary | ICD-10-CM | POA: Diagnosis not present

## 2023-09-02 DIAGNOSIS — R0602 Shortness of breath: Secondary | ICD-10-CM | POA: Diagnosis not present

## 2023-09-02 DIAGNOSIS — E041 Nontoxic single thyroid nodule: Secondary | ICD-10-CM | POA: Diagnosis not present

## 2023-09-02 DIAGNOSIS — Z853 Personal history of malignant neoplasm of breast: Secondary | ICD-10-CM | POA: Diagnosis not present

## 2023-09-02 DIAGNOSIS — R5383 Other fatigue: Secondary | ICD-10-CM | POA: Diagnosis not present

## 2023-09-02 DIAGNOSIS — D649 Anemia, unspecified: Secondary | ICD-10-CM | POA: Diagnosis not present

## 2023-09-02 DIAGNOSIS — Z8553 Personal history of malignant neoplasm of renal pelvis: Secondary | ICD-10-CM | POA: Diagnosis not present

## 2023-09-02 LAB — SURGICAL PATHOLOGY

## 2023-09-02 LAB — CBC WITH DIFFERENTIAL (CANCER CENTER ONLY)
Abs Immature Granulocytes: 0.04 K/uL (ref 0.00–0.07)
Basophils Absolute: 0 K/uL (ref 0.0–0.1)
Basophils Relative: 1 %
Eosinophils Absolute: 0.1 K/uL (ref 0.0–0.5)
Eosinophils Relative: 3 %
HCT: 26.9 % — ABNORMAL LOW (ref 36.0–46.0)
Hemoglobin: 8.7 g/dL — ABNORMAL LOW (ref 12.0–15.0)
Immature Granulocytes: 1 %
Lymphocytes Relative: 43 %
Lymphs Abs: 1.2 K/uL (ref 0.7–4.0)
MCH: 28.8 pg (ref 26.0–34.0)
MCHC: 32.3 g/dL (ref 30.0–36.0)
MCV: 89.1 fL (ref 80.0–100.0)
Monocytes Absolute: 0.3 K/uL (ref 0.1–1.0)
Monocytes Relative: 10 %
Neutro Abs: 1.2 K/uL — ABNORMAL LOW (ref 1.7–7.7)
Neutrophils Relative %: 42 %
Platelet Count: 98 K/uL — ABNORMAL LOW (ref 150–400)
RBC: 3.02 MIL/uL — ABNORMAL LOW (ref 3.87–5.11)
RDW: 22.9 % — ABNORMAL HIGH (ref 11.5–15.5)
WBC Count: 2.8 K/uL — ABNORMAL LOW (ref 4.0–10.5)
nRBC: 2.1 % — ABNORMAL HIGH (ref 0.0–0.2)

## 2023-09-02 LAB — SAMPLE TO BLOOD BANK

## 2023-09-02 NOTE — Progress Notes (Signed)
 [facility] OFFICE PROGRESS NOTE   Diagnosis: History of breast cancer, bladder cancer, anemia  INTERVAL HISTORY:   Cynthia [name] returns as scheduled.  She is more fatigued.  She has increased dyspnea on exertion.  Appetite is poor.  No weight loss.  No recent fevers or sweats.  No bleeding.  Objective:  Vital signs in last 24 hours:  Blood pressure 112/71, pulse 97, temperature 97.8 F (36.6 C), temperature source Temporal, resp. rate 18, height 5' 3 (1.6 m), weight 161 lb 3.2 oz (73.1 kg), SpO2 100%.    HEENT: No thrush or ulcers. Resp: Lungs clear bilaterally. Cardio: Regular rate and rhythm. GI: No hepatosplenomegaly. Vascular: No leg edema.   Lab Results:  Lab Results  Component Value Date   WBC 2.8 (L) 09/02/2023   HGB 8.7 (L) 09/02/2023   HCT 26.9 (L) 09/02/2023   MCV 89.1 09/02/2023   PLT 98 (L) 09/02/2023   NEUTROABS 1.2 (L) 09/02/2023    Imaging:  No results found.  Medications: I have reviewed the patient's current medications.  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, status post a left nephro ureterectomy 04/27/2019 Tumor extends to the muscularis, pT2, pN0 May 2023-recurrence at the left trigone area on surveillance cystoscopy-TURBT-invasive high-grade papillary urothelial carcinoma invading muscularis propria PET 07/29/2021-prior left nephro ureterectomy and TURBT, no evidence of metastatic disease Neoadjuvant gemcitabine /carboplatin  for 4 cycles 07/17/2021-September 2023 PET 10/24/2021-no evidence of hypermetabolic metastatic disease, focal hypermetabolism in the sigmoid colon with associated diverticular wall thickening, hypermetabolic 1.8 cm left thyroid  hyperdense nodule-unchanged CTs 10/31/2022-stable changes from left nephro ureterectomy, no evidence of recurrent disease, stable hyperdense thyroid  nodule 07/08/2023 transurethral resection of recurrent bladder tumor at the left trigone, bladder  neck, and proximal urethra-high-grade papillary carcinoma with foci of superficial invasion, scant muscularis present-not involved   2.  Congenital left horseshoe kidney 3.  Right breast cancer, status post right lumpectomy and sentinel lymph node biopsy 04/01/2016-pT1bpN0, status post a right lumpectomy, adjuvant radiation, and 5 years of anastrozole  (started 07/15/2016)   4.  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  Diabetes 6.  Gastroesophageal reflux disease 7.  IBS 8.  History of atrial fibrillation/flutter 9.  History of NSVT 10.  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 Esteve appears stable.  We reviewed the bone marrow biopsy report with her and her daughter at today's visit.  They understand the bone marrow shows MDS with excess blasts/increased blasts.  Cytogenetics and myeloid NexGen sequencing have been requested.  We are referring her to the leukemia service at [facility].  She will return for a follow-up visit on 09/17/2023.  She understands to contact the office in the interim with fever, chills, other signs of infection, bleeding, symptoms of progressive anemia.  Patient seen with Dr. Cloretta.  Olam Ned ANP/GNP-BC   09/02/2023  11:27 AM  This was a shared visit with Olam Ned.  Ms. Menon underwent a bone marrow biopsy 08/28/2023.  We reviewed the bone marrow findings with Ms. Rudy and her daughter.  She appears to have myelodysplasia with increased  versus excess blasts.  Molecular testing  is pending.  We discussed potential treatment options.  She will be referred to the leukemia service at [facility] for second opinion regarding the bone marrow findings and treatment.  We will follow-up the results from the MDS FISH panel.  I was present for greater than 50% of today's visit.  I performed medical decision making.  Arvella Hof, MD

## 2023-09-02 NOTE — Progress Notes (Signed)
 Referral faxed to [facility] Leukemia service at [phone] and scheduled for 8/28 at 10 am

## 2023-09-03 ENCOUNTER — Telehealth: Payer: Self-pay | Admitting: *Deleted

## 2023-09-03 DIAGNOSIS — D469 Myelodysplastic syndrome, unspecified: Secondary | ICD-10-CM | POA: Diagnosis not present

## 2023-09-03 NOTE — Telephone Encounter (Signed)
 Cynthia [name] called to report she saw Dr. Pierrette at [facility] today. He plans on sending MD a note re: visit. She says he told her that doing the procedure Dr. Cloretta suggested would not make any difference.  Currently scheduled to see Dr. Cloretta on 9/11 and asking if she needs to be seen sooner?

## 2023-09-09 ENCOUNTER — Telehealth (HOSPITAL_COMMUNITY): Payer: Self-pay

## 2023-09-09 ENCOUNTER — Telehealth: Payer: Self-pay | Admitting: *Deleted

## 2023-09-09 ENCOUNTER — Encounter (HOSPITAL_COMMUNITY): Payer: Self-pay

## 2023-09-09 NOTE — Telephone Encounter (Signed)
 Cynthia [name] called requesting bone marrow results and if should could be seen sooner than her 9/11 visit? Asking if we have received the note yet from Dr. Pierrette from [facility] yet? Returns to [facility] on 11/03/23. Note not in CareEverywhere yet. Called [facility] oncology and requested note be sent as soon as available. Nurse will relay to the team.

## 2023-09-10 ENCOUNTER — Encounter: Payer: Self-pay | Admitting: *Deleted

## 2023-09-10 ENCOUNTER — Telehealth: Payer: Self-pay | Admitting: Internal Medicine

## 2023-09-10 NOTE — Telephone Encounter (Signed)
 Patient would like to cancel her afib ablation on 10/1 - routed to A. Dreama   Will defer to primary cardiologist regarding new cardiologist in [place] area. Advised patient that some come to [facility] from this area for cardiology care.   Patient has restless legs - asking if she could wear compression stockings at night to help with this. Advised that from cardiology stance, compression stockings are worn for LE edema during the day and removed at night. Will defer to MD

## 2023-09-10 NOTE — Telephone Encounter (Signed)
 Patient stated she wants to cancel her 10/1 procedure as she will be moving to [place], [place].  Patient wants to get a recommendation for a cardiologist in that area.  Patient also wants to know if it will be safe for her to wear compression socks.  Patient stated can also leave her a voice message.

## 2023-09-10 NOTE — Telephone Encounter (Signed)
 Pt sched for AF ablation on 10/1. She had a bone marrow bx on 8/22 showing myelodysplastic syndrome. Noted abnormal CBC on 8/27. She had a second opinion consult with specialist  [facility] on 8/28; note is unavailable at this time. She will follow back up with [facility] on 9/11. Plan to follow up after this appointment on if patient is OK to proceed with ablation as scheduled.

## 2023-09-10 NOTE — Progress Notes (Signed)
 Faxed bone marrow pathology report and cytogenetics to Dr. Donnice Blalock at [facility] [phone]) and noted on cover sheet that Dr. Cloretta needs his 09/03/23 consult note please for upcoming appointment with him. Had called for this on 09/09/23.

## 2023-09-11 NOTE — Telephone Encounter (Signed)
 LM on pt's VM letting her know that I had cancelled her Ablation and if she decides she would like to reschedule to call and let us  know.

## 2023-09-15 DIAGNOSIS — D469 Myelodysplastic syndrome, unspecified: Secondary | ICD-10-CM | POA: Diagnosis not present

## 2023-09-16 NOTE — Telephone Encounter (Signed)
 Opened in error

## 2023-09-17 ENCOUNTER — Other Ambulatory Visit (HOSPITAL_COMMUNITY): Payer: Self-pay

## 2023-09-17 ENCOUNTER — Telehealth: Payer: Self-pay | Admitting: Pharmacist

## 2023-09-17 ENCOUNTER — Encounter (HOSPITAL_COMMUNITY): Payer: Self-pay | Admitting: Oncology

## 2023-09-17 ENCOUNTER — Encounter: Payer: Self-pay | Admitting: Oncology

## 2023-09-17 ENCOUNTER — Inpatient Hospital Stay: Attending: Oncology | Admitting: Nurse Practitioner

## 2023-09-17 ENCOUNTER — Telehealth: Payer: Self-pay | Admitting: Pharmacy Technician

## 2023-09-17 ENCOUNTER — Inpatient Hospital Stay

## 2023-09-17 VITALS — BP 108/70 | HR 78 | Temp 96.7°F | Resp 16 | Wt 168.9 lb

## 2023-09-17 DIAGNOSIS — D469 Myelodysplastic syndrome, unspecified: Secondary | ICD-10-CM

## 2023-09-17 DIAGNOSIS — Z23 Encounter for immunization: Secondary | ICD-10-CM | POA: Insufficient documentation

## 2023-09-17 DIAGNOSIS — K219 Gastro-esophageal reflux disease without esophagitis: Secondary | ICD-10-CM | POA: Diagnosis not present

## 2023-09-17 DIAGNOSIS — Q631 Lobulated, fused and horseshoe kidney: Secondary | ICD-10-CM | POA: Diagnosis not present

## 2023-09-17 DIAGNOSIS — D61818 Other pancytopenia: Secondary | ICD-10-CM | POA: Insufficient documentation

## 2023-09-17 DIAGNOSIS — R6883 Chills (without fever): Secondary | ICD-10-CM | POA: Diagnosis not present

## 2023-09-17 DIAGNOSIS — R5383 Other fatigue: Secondary | ICD-10-CM | POA: Insufficient documentation

## 2023-09-17 DIAGNOSIS — Z8551 Personal history of malignant neoplasm of bladder: Secondary | ICD-10-CM | POA: Insufficient documentation

## 2023-09-17 DIAGNOSIS — R0609 Other forms of dyspnea: Secondary | ICD-10-CM | POA: Diagnosis not present

## 2023-09-17 DIAGNOSIS — R112 Nausea with vomiting, unspecified: Secondary | ICD-10-CM | POA: Diagnosis not present

## 2023-09-17 DIAGNOSIS — E119 Type 2 diabetes mellitus without complications: Secondary | ICD-10-CM | POA: Diagnosis not present

## 2023-09-17 DIAGNOSIS — K589 Irritable bowel syndrome without diarrhea: Secondary | ICD-10-CM | POA: Diagnosis not present

## 2023-09-17 DIAGNOSIS — I4891 Unspecified atrial fibrillation: Secondary | ICD-10-CM | POA: Diagnosis not present

## 2023-09-17 DIAGNOSIS — Z853 Personal history of malignant neoplasm of breast: Secondary | ICD-10-CM | POA: Insufficient documentation

## 2023-09-17 DIAGNOSIS — Z8679 Personal history of other diseases of the circulatory system: Secondary | ICD-10-CM | POA: Insufficient documentation

## 2023-09-17 DIAGNOSIS — E041 Nontoxic single thyroid nodule: Secondary | ICD-10-CM | POA: Diagnosis not present

## 2023-09-17 LAB — CMP (CANCER CENTER ONLY)
ALT: 11 U/L (ref 0–44)
AST: 25 U/L (ref 15–41)
Albumin: 3.9 g/dL (ref 3.5–5.0)
Alkaline Phosphatase: 47 U/L (ref 38–126)
Anion gap: 10 (ref 5–15)
BUN: 24 mg/dL — ABNORMAL HIGH (ref 8–23)
CO2: 25 mmol/L (ref 22–32)
Calcium: 9.2 mg/dL (ref 8.9–10.3)
Chloride: 100 mmol/L (ref 98–111)
Creatinine: 1.14 mg/dL — ABNORMAL HIGH (ref 0.44–1.00)
GFR, Estimated: 48 mL/min — ABNORMAL LOW (ref >60)
Glucose, Bld: 108 mg/dL — ABNORMAL HIGH (ref 70–99)
Potassium: 3.8 mmol/L (ref 3.5–5.1)
Sodium: 135 mmol/L (ref 135–145)
Total Bilirubin: 0.7 mg/dL (ref 0.0–1.2)
Total Protein: 6.6 g/dL (ref 6.5–8.1)

## 2023-09-17 LAB — CBC WITH DIFFERENTIAL (CANCER CENTER ONLY)
Abs Immature Granulocytes: 0.05 K/uL (ref 0.00–0.07)
Basophils Absolute: 0 K/uL (ref 0.0–0.1)
Basophils Relative: 1 %
Eosinophils Absolute: 0.1 K/uL (ref 0.0–0.5)
Eosinophils Relative: 2 %
HCT: 20.8 % — ABNORMAL LOW (ref 36.0–46.0)
Hemoglobin: 6.6 g/dL — CL (ref 12.0–15.0)
Immature Granulocytes: 2 %
Lymphocytes Relative: 35 %
Lymphs Abs: 1.1 K/uL (ref 0.7–4.0)
MCH: 27.8 pg (ref 26.0–34.0)
MCHC: 31.7 g/dL (ref 30.0–36.0)
MCV: 87.8 fL (ref 80.0–100.0)
Monocytes Absolute: 0.5 K/uL (ref 0.1–1.0)
Monocytes Relative: 16 %
Neutro Abs: 1.4 K/uL — ABNORMAL LOW (ref 1.7–7.7)
Neutrophils Relative %: 44 %
Platelet Count: 41 K/uL — ABNORMAL LOW (ref 150–400)
RBC: 2.37 MIL/uL — ABNORMAL LOW (ref 3.87–5.11)
RDW: 23.6 % — ABNORMAL HIGH (ref 11.5–15.5)
WBC Count: 3.1 K/uL — ABNORMAL LOW (ref 4.0–10.5)
nRBC: 5.1 % — ABNORMAL HIGH (ref 0.0–0.2)

## 2023-09-17 LAB — SAMPLE TO BLOOD BANK

## 2023-09-17 LAB — PREPARE RBC (CROSSMATCH)

## 2023-09-17 MED ORDER — DECITABINE-CEDAZURIDINE 35-100 MG PO TABS
1.0000 | ORAL_TABLET | Freq: Every day | ORAL | 0 refills | Status: DC
  Filled 2023-09-18: qty 5, 28d supply, fill #0

## 2023-09-17 NOTE — Telephone Encounter (Signed)
 Oral Oncology Patient Advocate Encounter   New authorization   Received notification that prior authorization for Inqovi  is required.   PA submitted on CMM via [employer] [id number]  Status is pending     Cynthia [name] (Patty) [name], CPhT  [facility], [name], [place] Oral Chemotherapy Patient Advocate Specialist III Phone: [phone]  Fax: [phone]

## 2023-09-17 NOTE — Addendum Note (Signed)
 Addended by: DEBBY OLAM POUR on: 09/17/2023 04:28 PM   Modules accepted: Orders

## 2023-09-17 NOTE — Telephone Encounter (Signed)
 Oral Oncology Patient Advocate Encounter  Prior Authorization for Inqovi  has been approved.    PA# [id number]  Effective dates: 09/17/2023 through 03/15/2024  Patients co-pay is $0.    [place] (Patty) [name], CPhT  [facility], [name], [place] Oral Chemotherapy Patient Advocate Specialist III Phone: [phone]  Fax: [phone]

## 2023-09-17 NOTE — Telephone Encounter (Signed)
 Clinical Pharmacist Practitioner Encounter   Received new prescription for Inqovi  (decitabine /cedazuridine ) for the treatment of MDS-IB1, planned duration until disease progression or unacceptable drug toxicity.  Prescription dose and frequency assessed.   Current medication list in Epic reviewed, no DDIs with Inqovi  identified:  Evaluated chart and no patient barriers to medication adherence identified.   Prescription has been e-scribed to the [facility] for benefits analysis and approval.  Oral Oncology Clinic will continue to follow for insurance authorization, copayment issues, initial counseling and start date.   Chandria Rookstool N. Chala Gul, PharmD, BCOP, CPP Hematology/Oncology Clinical Pharmacist [facility] Oral Chemotherapy Navigation Clinic [phone]  09/17/2023 4:10 PM

## 2023-09-17 NOTE — Progress Notes (Signed)
 [facility] OFFICE PROGRESS NOTE   Diagnosis: MDS-IB1, history of breast cancer, bladder cancer  INTERVAL HISTORY:   Cynthia [name] returns as scheduled.  She saw Dr. Pierrette at [facility] 09/03/2023.  Recommendation is for decitabine -cedazuridine .  She notes easy bruising.  No bleeding.  She is fatigued.  She has dyspnea on exertion.  Objective:  Vital signs in last 24 hours:  Blood pressure (!) 103/58, pulse 78, temperature (!) 96.7 F (35.9 C), temperature source Temporal, resp. rate 16, weight 168 lb 14.4 oz (76.6 kg), SpO2 97%.    HEENT: No thrush or ulcers. Resp: Lungs clear bilaterally. Cardio: Irregular, systolic murmur. GI: No hepatosplenomegaly. Vascular: Trace bilateral ankle edema. Neuro: Alert and oriented. Skin: Ecchymoses scattered over the lower arms and lower legs.   Lab Results:  Lab Results  Component Value Date   WBC 3.1 (L) 09/17/2023   HGB 6.6 (LL) 09/17/2023   HCT 20.8 (L) 09/17/2023   MCV 87.8 09/17/2023   PLT 41 (L) 09/17/2023   NEUTROABS 1.4 (L) 09/17/2023    Imaging:  No results found.  Medications: I have reviewed the patient's current medications.  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, status post a left nephro ureterectomy 04/27/2019 Tumor extends to the muscularis, pT2, pN0 May 2023-recurrence at the left trigone area on surveillance cystoscopy-TURBT-invasive high-grade papillary urothelial carcinoma invading muscularis propria PET 07/29/2021-prior left nephro ureterectomy and TURBT, no evidence of metastatic disease Neoadjuvant gemcitabine /carboplatin  for 4 cycles 07/17/2021-September 2023 PET 10/24/2021-no evidence of hypermetabolic metastatic disease, focal hypermetabolism in the sigmoid colon with associated diverticular wall thickening, hypermetabolic 1.8 cm left thyroid  hyperdense nodule-unchanged CTs 10/31/2022-stable changes from left nephro ureterectomy, no evidence of  recurrent disease, stable hyperdense thyroid  nodule 07/08/2023 transurethral resection of recurrent bladder tumor at the left trigone, bladder neck, and proximal urethra-high-grade papillary carcinoma with foci of superficial invasion, scant muscularis present-not involved   2.  Congenital left horseshoe kidney 3.  Right breast cancer, status post right lumpectomy and sentinel lymph node biopsy 04/01/2016-pT1bpN0, status post a right lumpectomy, adjuvant radiation, and 5 years of anastrozole  (started 07/15/2016)   4.  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  Diabetes 6.  Gastroesophageal reflux disease 7.  IBS 8.  History of atrial fibrillation/flutter 9.  History of NSVT 10.  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  She has seen Dr. Marilyn at [facility] with the recommendation to begin treatment with decitabine -cedazuridine .  We reviewed potential side effects including hematologic toxicity, nausea, mouth sores, diarrhea or constipation, rash.  We also discussed a supportive/comfort care approach.  She would like to proceed with treatment.  She and her daughter understand the goal of treatment is palliative, not curative.  They understand the likelihood of needing frequent transfusion support with red cells and platelets.   We discussed the risk of an allergic reaction and infection transmission with transfusions.  She understands and agrees to transfusion support as needed.  CBC from today shows progressive  pancytopenia.  She has symptomatic anemia.  We are making arrangements for a blood transfusion 09/18/2023.  Platelet count is lower at 41,000.  We recommended she discontinue Eliquis  which she is currently taking for atrial fibrillation.  She agrees with stopping Eliquis .  If ANC declines to less than 500 plan to begin acyclovir, levofloxacin and posaconazole.  Anticipate start date cycle 1 decitabine -cedazuridine  daily for 5 days 09/21/2023 or 09/22/2023.  She will begin weekly labs next week.  She will return for an office visit in 2 weeks.  We are available to see her sooner if needed.  Patient seen with Dr. Cloretta.  Olam Ned ANP/GNP-BC   09/17/2023  3:01 PM  This was a shared visit with Olam Ned.  Ms. Hada is interviewed and examined.  She has progressive pancytopenia secondary to myelodysplasia with excess blasts.  I discussed the case with Dr. Pierrette.  We reviewed the molecular findings.  He recommends treatment with oral decitabine  and cedazuridine ..   We discussed comfort/supportive care versus a trial of decitabine /cedazuridine  with Ms. Pester and her daughter.  We discussed potential toxicities associated with this agent.  She understands the potential for hematologic toxicity, infection, bleeding, nausea, diarrhea, and rash.  She would like to begin a trial of decitabine /Cedazuridine .  She has symptomatic anemia today.  She will receive a red cell transfusion within the next 1-2 days.  The plan is to initiate systemic therapy next week.  I was present for greater than 50% of today's visit.  I i performed Medical Decision Making.  Arvella Cloretta, MD

## 2023-09-18 ENCOUNTER — Other Ambulatory Visit: Payer: Self-pay | Admitting: Pharmacy Technician

## 2023-09-18 ENCOUNTER — Other Ambulatory Visit: Payer: Self-pay

## 2023-09-18 ENCOUNTER — Inpatient Hospital Stay

## 2023-09-18 ENCOUNTER — Other Ambulatory Visit (HOSPITAL_COMMUNITY): Payer: Self-pay

## 2023-09-18 ENCOUNTER — Other Ambulatory Visit: Payer: Self-pay | Admitting: Nurse Practitioner

## 2023-09-18 ENCOUNTER — Telehealth: Payer: Self-pay | Admitting: Pharmacy Technician

## 2023-09-18 DIAGNOSIS — D469 Myelodysplastic syndrome, unspecified: Secondary | ICD-10-CM

## 2023-09-18 DIAGNOSIS — Z23 Encounter for immunization: Secondary | ICD-10-CM

## 2023-09-18 MED ORDER — PROCHLORPERAZINE MALEATE 5 MG PO TABS
5.0000 mg | ORAL_TABLET | Freq: Four times a day (QID) | ORAL | 0 refills | Status: DC | PRN
  Filled 2023-09-18: qty 30, 8d supply, fill #0

## 2023-09-18 MED ORDER — SODIUM CHLORIDE 0.9% IV SOLUTION: 250.0000 mL | INTRAVENOUS | Status: DC

## 2023-09-18 MED ORDER — INFLUENZA VAC SPLIT HIGH-DOSE 0.5 ML IM SUSY
0.5000 mL | PREFILLED_SYRINGE | Freq: Once | INTRAMUSCULAR | Status: AC
  Administered 2023-09-18: 0.5 mL via INTRAMUSCULAR
  Filled 2023-09-18: qty 0.5

## 2023-09-18 NOTE — Telephone Encounter (Signed)
 Clinical Pharmacist Practitioner Encounter   [facility] (Specialty) will have medication ready for pick up on 09/21/23. Patient knows to start once they have medication in hand.  Patient Education I spoke with patient's daughter Cynthia [name] for overview of new oral chemotherapy medication: Inqovi  (decitabine /cedazuridine ) for the treatment of MDS-IB1, planned duration until disease progression or unacceptable drug toxicity.   Treatment goal: Palliative  Counseled Barbara on administration, dosing, side effects, monitoring, drug-food interactions, safe handling, storage, and disposal. Patient will take 1 tablet by mouth daily. Take for 5 days, hold for 23d, repeat every 28d. Take on an empty stomach, at least 2 hr before or after food .  Side effects include but not limited to: diarrhea or constipation, rash, mouth sores, fatigue, decrease wbc/hgb/plt.   Diarrhea: patient knows to use loperamide  as needed and call the office if they are having four or more loose stool per day  Reviewed with Cynthia [name] importance of keeping a medication schedule and plan for any missed doses.  After discussion with Cynthia [name] no patient barriers to medication adherence identified.   Distress evaluation: Distress thermometer completed during telephone call and reviewed with patient. Due to score, social work referral has not been sent.   Communication and Learning Assessment Primary learner: patient's daughter Cynthia [name] Barriers to learning: No barriers Preferred language: English Learning preferences: Listening Reading   Cynthia [name]  voiced understanding and appreciation. All questions answered. Medication handout provided.  Provided [facility] with Oral Chemotherapy Navigation Clinic phone number. Cynthia [name] knows to call the office with questions or concerns. Oral Chemotherapy Navigation Clinic will continue to follow.  Deniel Mcquiston N. Juri Dinning, PharmD, BCOP, CPP Hematology/Oncology Clinical Pharmacist [facility] Oral  Chemotherapy Navigation Clinic [phone]  09/18/2023 11:05 AM

## 2023-09-18 NOTE — Progress Notes (Signed)
 Specialty Pharmacy Initial Fill Coordination Note  Cynthia [name] is a 78 y.o. female contacted today regarding initial fill of specialty medication(s) Decitabine -Cedazuridine  (INQOVI )   Patient requested Pickup at [facility] at [place] date: 09/21/23   Medication will be filled on 09/18/2023.   Patient is aware of $0 copayment.    Phill Steck (Patty) [name], CPhT  [facility], [name], [place] Oral Chemotherapy Patient Advocate Specialist III Phone: [phone]  Fax: [phone]

## 2023-09-18 NOTE — Telephone Encounter (Signed)
 Patient successfully OnBoarded and drug education provided by pharmacist. Medication scheduled to be filled on 09/12 for pick up on 09/15 from [facility]. Patient also knows to call me at [phone] with any questions or concerns regarding receiving medication or if there is any unexpected change in co-pay.   Cynthia [name] (Patty) [name], CPhT  [facility], [name], [place] Oral Chemotherapy Patient Advocate Specialist III Phone: [phone]  Fax: [phone]

## 2023-09-18 NOTE — Progress Notes (Signed)
 Patient education documented in EPIC note on 09/18/23.

## 2023-09-18 NOTE — Patient Instructions (Signed)

## 2023-09-19 ENCOUNTER — Encounter

## 2023-09-21 ENCOUNTER — Other Ambulatory Visit: Payer: Self-pay | Admitting: *Deleted

## 2023-09-21 ENCOUNTER — Telehealth: Payer: Self-pay | Admitting: *Deleted

## 2023-09-21 LAB — BPAM RBC
Blood Product Expiration Date: 202510012359
Blood Product Expiration Date: 202510012359
ISSUE DATE / TIME: 202509120815
ISSUE DATE / TIME: 202509120815
Unit Type and Rh: 600
Unit Type and Rh: 600

## 2023-09-21 LAB — TYPE AND SCREEN
ABO/RH(D): A NEG
Antibody Screen: NEGATIVE
Unit division: 0
Unit division: 0

## 2023-09-21 MED ORDER — TRAMADOL-ACETAMINOPHEN 37.5-325 MG PO TABS: 1.0000 | ORAL_TABLET | Freq: Every day | ORAL | 1 refills | Status: DC | PRN

## 2023-09-21 NOTE — Telephone Encounter (Signed)
 Acharya, Gayatri A, MD to Me  Cynthia Buel BROCKS, RN (Selected Message) 09/20/23  3:37 PM I have not seen this patient in 5 years, and only seen once in hospital. Looks like she was primarily seen by EP.    It is generally safe to wear compression socks if needed. Would have her review with her primary doctor if any concerns.   We wish her all the best with her care moving to [facility] with above info sent to patient via MyChart

## 2023-09-21 NOTE — Telephone Encounter (Signed)
 Tatayana called today and left VM requesting the following: Asking for Tramadol  to take at night for her body aches/restlessness Wants her appointments late in day as possible Asking for blood transfusions to be at [facility] if possible because they have TV Spoke w/MD and he agrees to try Ultracet  at hs prn; OK for blood at [facility] if they have the opening.  Called daughter and she reports that Lynsie seems to get SOB easily and is very weak. Thinks she may need blood again. Also Shenee is requesting a central line and wants a port so nothing is hanging out and she is a very hard venous access. She is aware that Ultracet  was sent to pharmacy and is being ordered. Moved her 9/17 lab to tomorrow at 3 pm. Informed her that she may not always be able to go to [facility], since that infusion room is very busy and she will need to come earlier in day to receive blood. Daughter understands and agrees. Bryan is starting her decitabine -cedazuridine  today.

## 2023-09-22 ENCOUNTER — Inpatient Hospital Stay

## 2023-09-22 DIAGNOSIS — D469 Myelodysplastic syndrome, unspecified: Secondary | ICD-10-CM

## 2023-09-22 LAB — CMP (CANCER CENTER ONLY)
ALT: 14 U/L (ref 0–44)
AST: 29 U/L (ref 15–41)
Albumin: 4 g/dL (ref 3.5–5.0)
Alkaline Phosphatase: 45 U/L (ref 38–126)
Anion gap: 10 (ref 5–15)
BUN: 20 mg/dL (ref 8–23)
CO2: 27 mmol/L (ref 22–32)
Calcium: 9.3 mg/dL (ref 8.9–10.3)
Chloride: 102 mmol/L (ref 98–111)
Creatinine: 1.11 mg/dL — ABNORMAL HIGH (ref 0.44–1.00)
GFR, Estimated: 50 mL/min — ABNORMAL LOW (ref >60)
Glucose, Bld: 105 mg/dL — ABNORMAL HIGH (ref 70–99)
Potassium: 4.4 mmol/L (ref 3.5–5.1)
Sodium: 139 mmol/L (ref 135–145)
Total Bilirubin: 1.1 mg/dL (ref 0.0–1.2)
Total Protein: 6.7 g/dL (ref 6.5–8.1)

## 2023-09-22 LAB — CBC WITH DIFFERENTIAL (CANCER CENTER ONLY)
Abs Immature Granulocytes: 0.03 K/uL (ref 0.00–0.07)
Basophils Absolute: 0 K/uL (ref 0.0–0.1)
Basophils Relative: 1 %
Eosinophils Absolute: 0 K/uL (ref 0.0–0.5)
Eosinophils Relative: 1 %
HCT: 27.1 % — ABNORMAL LOW (ref 36.0–46.0)
Hemoglobin: 8.5 g/dL — ABNORMAL LOW (ref 12.0–15.0)
Immature Granulocytes: 1 %
Lymphocytes Relative: 30 %
Lymphs Abs: 0.9 K/uL (ref 0.7–4.0)
MCH: 28.6 pg (ref 26.0–34.0)
MCHC: 31.4 g/dL (ref 30.0–36.0)
MCV: 91.2 fL (ref 80.0–100.0)
Monocytes Absolute: 0.4 K/uL (ref 0.1–1.0)
Monocytes Relative: 14 %
Neutro Abs: 1.6 K/uL — ABNORMAL LOW (ref 1.7–7.7)
Neutrophils Relative %: 53 %
Platelet Count: 28 K/uL — ABNORMAL LOW (ref 150–400)
RBC: 2.97 MIL/uL — ABNORMAL LOW (ref 3.87–5.11)
RDW: 22 % — ABNORMAL HIGH (ref 11.5–15.5)
WBC Count: 3.1 K/uL — ABNORMAL LOW (ref 4.0–10.5)
nRBC: 3.6 % — ABNORMAL HIGH (ref 0.0–0.2)

## 2023-09-22 LAB — SAMPLE TO BLOOD BANK

## 2023-09-23 ENCOUNTER — Telehealth: Payer: Self-pay | Admitting: *Deleted

## 2023-09-23 ENCOUNTER — Other Ambulatory Visit: Payer: Self-pay | Admitting: *Deleted

## 2023-09-23 DIAGNOSIS — D469 Myelodysplastic syndrome, unspecified: Secondary | ICD-10-CM

## 2023-09-23 NOTE — Telephone Encounter (Signed)
 Called daughter to inform that she will not need blood transfusion w/Hgb 8.5. Platelets have decreased to 28,000, but she is not bleeding. Daughter is asking Dr. Andriette best thought on her life expectancy worst case and best case scenario. Will make MD aware of request. Also after conversation, NP has ordered to move her appointments up earlier in the day so we will have time to transfuse platelets if they are low. Will need all her appointments early due to potential need for platelets. High priority scheduling message sent.

## 2023-09-23 NOTE — Progress Notes (Signed)
 Placed order for [facility] placement per IR>

## 2023-09-24 ENCOUNTER — Telehealth: Payer: Self-pay | Admitting: Nurse Practitioner

## 2023-09-24 ENCOUNTER — Other Ambulatory Visit

## 2023-09-25 ENCOUNTER — Telehealth: Payer: Self-pay | Admitting: Oncology

## 2023-09-26 ENCOUNTER — Other Ambulatory Visit (HOSPITAL_COMMUNITY): Payer: Self-pay

## 2023-09-30 DIAGNOSIS — D469 Myelodysplastic syndrome, unspecified: Secondary | ICD-10-CM | POA: Diagnosis not present

## 2023-09-30 DIAGNOSIS — G2581 Restless legs syndrome: Secondary | ICD-10-CM | POA: Diagnosis not present

## 2023-09-30 DIAGNOSIS — R040 Epistaxis: Secondary | ICD-10-CM | POA: Diagnosis not present

## 2023-09-30 DIAGNOSIS — R21 Rash and other nonspecific skin eruption: Secondary | ICD-10-CM | POA: Diagnosis not present

## 2023-10-01 ENCOUNTER — Ambulatory Visit: Admitting: Nurse Practitioner

## 2023-10-01 ENCOUNTER — Inpatient Hospital Stay: Admitting: Nurse Practitioner

## 2023-10-01 ENCOUNTER — Telehealth: Payer: Self-pay

## 2023-10-01 ENCOUNTER — Other Ambulatory Visit

## 2023-10-01 ENCOUNTER — Inpatient Hospital Stay

## 2023-10-01 ENCOUNTER — Encounter: Payer: Self-pay | Admitting: Nurse Practitioner

## 2023-10-01 VITALS — BP 97/57 | HR 95 | Temp 97.8°F | Resp 18 | Ht 63.0 in | Wt 166.3 lb

## 2023-10-01 DIAGNOSIS — Z8551 Personal history of malignant neoplasm of bladder: Secondary | ICD-10-CM | POA: Diagnosis not present

## 2023-10-01 DIAGNOSIS — C50211 Malignant neoplasm of upper-inner quadrant of right female breast: Secondary | ICD-10-CM

## 2023-10-01 DIAGNOSIS — D469 Myelodysplastic syndrome, unspecified: Secondary | ICD-10-CM

## 2023-10-01 DIAGNOSIS — R6883 Chills (without fever): Secondary | ICD-10-CM | POA: Diagnosis not present

## 2023-10-01 DIAGNOSIS — E041 Nontoxic single thyroid nodule: Secondary | ICD-10-CM | POA: Diagnosis not present

## 2023-10-01 DIAGNOSIS — Z170 Estrogen receptor positive status [ER+]: Secondary | ICD-10-CM | POA: Diagnosis not present

## 2023-10-01 DIAGNOSIS — Z23 Encounter for immunization: Secondary | ICD-10-CM | POA: Diagnosis not present

## 2023-10-01 DIAGNOSIS — D61818 Other pancytopenia: Secondary | ICD-10-CM | POA: Diagnosis not present

## 2023-10-01 DIAGNOSIS — R112 Nausea with vomiting, unspecified: Secondary | ICD-10-CM | POA: Diagnosis not present

## 2023-10-01 DIAGNOSIS — R0609 Other forms of dyspnea: Secondary | ICD-10-CM | POA: Diagnosis not present

## 2023-10-01 DIAGNOSIS — Z853 Personal history of malignant neoplasm of breast: Secondary | ICD-10-CM | POA: Diagnosis not present

## 2023-10-01 LAB — CBC WITH DIFFERENTIAL (CANCER CENTER ONLY)
Abs Immature Granulocytes: 0.1 K/uL — ABNORMAL HIGH (ref 0.00–0.07)
Basophils Absolute: 0 K/uL (ref 0.0–0.1)
Basophils Relative: 1 %
Eosinophils Absolute: 0.1 K/uL (ref 0.0–0.5)
Eosinophils Relative: 3 %
HCT: 20.7 % — ABNORMAL LOW (ref 36.0–46.0)
Hemoglobin: 6.5 g/dL — CL (ref 12.0–15.0)
Immature Granulocytes: 5 %
Lymphocytes Relative: 55 %
Lymphs Abs: 1 K/uL (ref 0.7–4.0)
MCH: 28.8 pg (ref 26.0–34.0)
MCHC: 31.4 g/dL (ref 30.0–36.0)
MCV: 91.6 fL (ref 80.0–100.0)
Monocytes Absolute: 0 K/uL — ABNORMAL LOW (ref 0.1–1.0)
Monocytes Relative: 2 %
Neutro Abs: 0.6 K/uL — ABNORMAL LOW (ref 1.7–7.7)
Neutrophils Relative %: 34 %
Platelet Count: 11 K/uL — ABNORMAL LOW (ref 150–400)
RBC: 2.26 MIL/uL — ABNORMAL LOW (ref 3.87–5.11)
RDW: 21 % — ABNORMAL HIGH (ref 11.5–15.5)
WBC Count: 1.9 K/uL — ABNORMAL LOW (ref 4.0–10.5)
nRBC: 0 % (ref 0.0–0.2)

## 2023-10-01 LAB — CMP (CANCER CENTER ONLY)
ALT: 13 U/L (ref 0–44)
AST: 27 U/L (ref 15–41)
Albumin: 3.7 g/dL (ref 3.5–5.0)
Alkaline Phosphatase: 44 U/L (ref 38–126)
Anion gap: 9 (ref 5–15)
BUN: 22 mg/dL (ref 8–23)
CO2: 28 mmol/L (ref 22–32)
Calcium: 9.9 mg/dL (ref 8.9–10.3)
Chloride: 99 mmol/L (ref 98–111)
Creatinine: 0.96 mg/dL (ref 0.44–1.00)
GFR, Estimated: 59 mL/min — ABNORMAL LOW (ref >60)
Glucose, Bld: 114 mg/dL — ABNORMAL HIGH (ref 70–99)
Potassium: 3.9 mmol/L (ref 3.5–5.1)
Sodium: 136 mmol/L (ref 135–145)
Total Bilirubin: 0.9 mg/dL (ref 0.0–1.2)
Total Protein: 6.5 g/dL (ref 6.5–8.1)

## 2023-10-01 LAB — SAMPLE TO BLOOD BANK

## 2023-10-01 LAB — PREPARE RBC (CROSSMATCH)

## 2023-10-01 MED ORDER — SODIUM CHLORIDE 0.9% IV SOLUTION
250.0000 mL | INTRAVENOUS | Status: DC
  Administered 2023-10-01: 250 mL via INTRAVENOUS

## 2023-10-01 NOTE — Patient Instructions (Signed)
 Getting Platelets Through an IV (Platelet Transfusion) in Adults: What to Expect A platelet transfusion is when a person gets platelets from another person (donor) through an IV. Platelets are parts of blood that stick together and form a clot to help stop bleeding after an injury. If you don't have enough platelets, you might bleed or bruise easily. You may need a platelet transfusion if you have a health problem that causes a low number of platelets, such as thrombocytopenia. A platelet transfusion may be used to stop or prevent too much bleeding. Tell a health care provider about: Any reactions you've had during past transfusions. Any allergies you have. All medicines you take. These include vitamins, herbs, eye drops, and creams. Any bleeding problems you have. Any surgeries you've had. Any health problems you have. Whether you're pregnant or may be pregnant. What are the risks? Your health care provider will talk with you about risks. These may include: Fever or chills. This might happen if your body reacts to the new cells. It can happen during or up to 4 hours after the transfusion. A mild allergy. You might get red, swollen areas on your skin (hives). You might feel itchy. A bad allergy. You might have trouble breathing or swelling around your face and lips. Very bad reactions are rare but can happen. These may be: Infection. This is rare because donated blood is carefully tested. Hemolytic reaction. This is when the defense system (immune system) of your body attacks the new cells. Symptoms include fever, chills, and a feeling that you may throw up. You may also have low blood pressure and pain in your chest or lower back. Transfusion-associated circulatory overload (TACO). This happens if fluids move to your lungs. This may cause breathing problems. Transfusion-related acute lung injury (TRALI). TRALI can cause breathing problems and low oxygen in your blood. This can happen within  hours or days after the transfusion. Transfusion-associated graft-versus-host disease (TAGVHD). This happens when donated cells attack the body's healthy tissues. What happens before? Take your medicines only as told. You will have a blood test to find out your blood type. This helps match the donor blood with your blood. If you've had an allergy to a transfusion in the past, you may be given medicine to help prevent another allergy. Your temperature, blood pressure, pulse, and breathing will be checked. What happens during platelet transfusion?  An IV will be put into a vein in your hand or arm. For your safety, two health care team members will check your identity and the donor platelets. The bag of donor platelets will be connected to your IV. The platelets will flow into your blood. This usually takes 30-60 minutes. Your temperature, blood pressure, pulse, and breathing will be checked. This helps catch signs of a reaction early. You will be watched for symptoms of all types of reactions, like chills, hives, or itching. If you show signs of a reaction, the transfusion will be stopped. You may be given medicine to help treat the reaction. When your transfusion is done, your IV will be removed. Pressure may be put on the IV site for a few minutes to stop any bleeding. The IV site will be covered with a bandage. These steps may vary. Ask what you can expect. What happens after? You will be watched closely until you leave. This includes checking your blood pressure, temperature, pulse rate, and breathing rate again. This information is not intended to replace advice given to you by your health care  provider. Make sure you discuss any questions you have with your health care provider. Document Revised: 04/10/2023 Document Reviewed: 04/10/2023 Elsevier Patient Education  2025 [employer].

## 2023-10-01 NOTE — Progress Notes (Signed)
 CRITICAL VALUE STICKER  CRITICAL VALUE: Hgb 6.5  RECEIVER (on-site recipient of call):Hokulani Rogel,RN  DATE & TIME NOTIFIED: 10/01/23 @ 1050  MESSENGER (representative from lab):Thersia  MD NOTIFIED: Olam Ned, NP  TIME OF NOTIFICATION: 1055  RESPONSE:  Transfusion this week

## 2023-10-01 NOTE — Progress Notes (Signed)
 Cynthia [name]   Diagnosis:  MDS-IB1, history of breast cancer, bladder cancer  INTERVAL HISTORY:   Cynthia [name] returns as scheduled.  She began decitabine -cedazuridine  09/21/2023.  She has intermittent nausea, occasional vomiting.  She had a minor nosebleed.  No other bleeding.  She has dyspnea on exertion.  No diarrhea.  About a week ago she had a fever and shaking chills during the night.  Temperature max 101.  She chronically has intermittent shaking chills.  Objective:  Vital signs in last 24 hours:  Blood pressure (!) 97/57, pulse 95, temperature 97.8 F (36.6 C), temperature source Temporal, resp. rate 18, height 5' 3 (1.6 m), weight 166 lb 4.8 oz (75.4 kg), SpO2 98%.    HEENT: Petechiae at the posterior palate.  No thrush. Resp: Lungs clear bilaterally. Cardio: Regular rate and rhythm. GI: No hepatosplenomegaly. Vascular: Trace edema lower leg bilaterally. Skin: Petechiae at the lower leg, dorsum of the feet.   Lab Results:  Lab Results  Component Value Date   WBC 1.9 (L) 10/01/2023   HGB 6.5 (LL) 10/01/2023   HCT 20.7 (L) 10/01/2023   MCV 91.6 10/01/2023   PLT 11 (L) 10/01/2023   NEUTROABS 0.6 (L) 10/01/2023    Imaging:  No results found.  Medications: I have reviewed the patient's current medications.  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, status post a left nephro ureterectomy 04/27/2019 Tumor extends to the muscularis, pT2, pN0 May 2023-recurrence at the left trigone area on surveillance cystoscopy-TURBT-invasive high-grade papillary urothelial carcinoma invading muscularis propria PET 07/29/2021-prior left nephro ureterectomy and TURBT, no evidence of metastatic disease Neoadjuvant gemcitabine /carboplatin  for 4 cycles 07/17/2021-September 2023 PET 10/24/2021-no evidence of hypermetabolic metastatic disease, focal hypermetabolism in the sigmoid colon with associated diverticular wall  thickening, hypermetabolic 1.8 cm left thyroid  hyperdense nodule-unchanged CTs 10/31/2022-stable changes from left nephro ureterectomy, no evidence of recurrent disease, stable hyperdense thyroid  nodule 07/08/2023 transurethral resection of recurrent bladder tumor at the left trigone, bladder neck, and proximal urethra-high-grade papillary carcinoma with foci of superficial invasion, scant muscularis present-not involved   2.  Congenital left horseshoe kidney 3.  Right breast cancer, status post right lumpectomy and sentinel lymph node biopsy 04/01/2016-pT1bpN0, status post a right lumpectomy, adjuvant radiation, and 5 years of anastrozole  (started 07/15/2016)   4.  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  Diabetes 6.  Gastroesophageal reflux disease 7.  IBS 8.  History of atrial fibrillation/flutter 9.  History of NSVT 10.  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  She begin a trial of decitabine -cedazuridine  09/21/2023.  We again reviewed the diagnosis and prognosis with her and her daughter at today's visit.  She would like to continue the current treatment.  She has progressive severe pancytopenia.  She will receive platelets today and 2 units of blood tomorrow.  We discussed beginning prophylaxis  with posaconazole, Levaquin and acyclovir .  We have [facility] oral chemotherapy pharmacist to review  prior to prescribing due to multiple potential drug interactions.  Plan for twice weekly labs, transfusion support as needed, weekly office visit.  She will contact the office between visits with any problems.  Patient seen with Dr. Cloretta.   Cynthia [name] ANP/GNP-BC   10/01/2023  8:07 PM This was a shared visit with Cynthia [name].  Cynthia [name] was interviewed and examined.  She has progressive severe pancytopenia.  She had progressive pancytopenia prior to beginning decitabine  09/21/2023.  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  We discussed the prognosis and treatment options in detail with Cynthia [name] and her daughter.  She she understands the expected prognosis of weeks to a few months without treating the MDS.  She understands the risk of infection and bleeding.  We discussed comfort/hospice care.  She would like to continue decitabine  with platelet and red cell transfusion support.  She will be placed on antibiotic prophylaxis.  She will return for packed red blood cells tomorrow and received platelets today.  I was present for greater than 50% of today's visit.  I performed medical decision making.  Cynthia Cloretta, MD

## 2023-10-01 NOTE — Telephone Encounter (Signed)
 The [facility] order has been placed, and [facility] has been contacted to pick up the order at 7:00 AM for delivery by 8:00 AM. Ronal at [facility] has confirmed that the order has been received and is accessible. The pickup slide has been faxed to [facility].

## 2023-10-02 ENCOUNTER — Other Ambulatory Visit (HOSPITAL_BASED_OUTPATIENT_CLINIC_OR_DEPARTMENT_OTHER): Payer: Self-pay

## 2023-10-02 ENCOUNTER — Inpatient Hospital Stay

## 2023-10-02 ENCOUNTER — Encounter: Payer: Self-pay | Admitting: *Deleted

## 2023-10-02 ENCOUNTER — Other Ambulatory Visit: Payer: Self-pay | Admitting: Nurse Practitioner

## 2023-10-02 ENCOUNTER — Other Ambulatory Visit: Payer: Self-pay

## 2023-10-02 DIAGNOSIS — Z8551 Personal history of malignant neoplasm of bladder: Secondary | ICD-10-CM | POA: Diagnosis not present

## 2023-10-02 DIAGNOSIS — E041 Nontoxic single thyroid nodule: Secondary | ICD-10-CM | POA: Diagnosis not present

## 2023-10-02 DIAGNOSIS — Z853 Personal history of malignant neoplasm of breast: Secondary | ICD-10-CM | POA: Diagnosis not present

## 2023-10-02 DIAGNOSIS — D469 Myelodysplastic syndrome, unspecified: Secondary | ICD-10-CM | POA: Diagnosis not present

## 2023-10-02 DIAGNOSIS — Z23 Encounter for immunization: Secondary | ICD-10-CM | POA: Diagnosis not present

## 2023-10-02 DIAGNOSIS — R112 Nausea with vomiting, unspecified: Secondary | ICD-10-CM | POA: Diagnosis not present

## 2023-10-02 DIAGNOSIS — D61818 Other pancytopenia: Secondary | ICD-10-CM | POA: Diagnosis not present

## 2023-10-02 DIAGNOSIS — Z170 Estrogen receptor positive status [ER+]: Secondary | ICD-10-CM

## 2023-10-02 DIAGNOSIS — D709 Neutropenia, unspecified: Secondary | ICD-10-CM

## 2023-10-02 DIAGNOSIS — R6883 Chills (without fever): Secondary | ICD-10-CM | POA: Diagnosis not present

## 2023-10-02 LAB — PREPARE PLATELET PHERESIS: Unit division: 0

## 2023-10-02 LAB — CBC WITH DIFFERENTIAL (CANCER CENTER ONLY)
Abs Immature Granulocytes: 0.02 K/uL (ref 0.00–0.07)
Basophils Absolute: 0 K/uL (ref 0.0–0.1)
Basophils Relative: 1 %
Eosinophils Absolute: 0 K/uL (ref 0.0–0.5)
Eosinophils Relative: 2 %
HCT: 19 % — ABNORMAL LOW (ref 36.0–46.0)
Hemoglobin: 5.9 g/dL — CL (ref 12.0–15.0)
Immature Granulocytes: 1 %
Lymphocytes Relative: 63 %
Lymphs Abs: 1.3 K/uL (ref 0.7–4.0)
MCH: 28.6 pg (ref 26.0–34.0)
MCHC: 31.1 g/dL (ref 30.0–36.0)
MCV: 92.2 fL (ref 80.0–100.0)
Monocytes Absolute: 0 K/uL — ABNORMAL LOW (ref 0.1–1.0)
Monocytes Relative: 2 %
Neutro Abs: 0.6 K/uL — ABNORMAL LOW (ref 1.7–7.7)
Neutrophils Relative %: 31 %
Platelet Count: 17 K/uL — ABNORMAL LOW (ref 150–400)
RBC: 2.06 MIL/uL — ABNORMAL LOW (ref 3.87–5.11)
RDW: 21 % — ABNORMAL HIGH (ref 11.5–15.5)
WBC Count: 2 K/uL — ABNORMAL LOW (ref 4.0–10.5)
nRBC: 0 % (ref 0.0–0.2)

## 2023-10-02 LAB — BPAM PLATELET PHERESIS
Blood Product Expiration Date: 202509272359
ISSUE DATE / TIME: 202509251141
Unit Type and Rh: 7300

## 2023-10-02 MED ORDER — FLUCONAZOLE 200 MG PO TABS
200.0000 mg | ORAL_TABLET | Freq: Every day | ORAL | 3 refills | Status: DC
  Filled 2023-10-02: qty 30, 30d supply, fill #0

## 2023-10-02 MED ORDER — ACYCLOVIR 400 MG PO TABS
400.0000 mg | ORAL_TABLET | Freq: Two times a day (BID) | ORAL | 3 refills | Status: DC
  Filled 2023-10-02: qty 60, 30d supply, fill #0

## 2023-10-02 MED ORDER — SULFAMETHOXAZOLE-TRIMETHOPRIM 800-160 MG PO TABS
1.0000 | ORAL_TABLET | ORAL | 3 refills | Status: DC
  Filled 2023-10-02: qty 12, 28d supply, fill #0

## 2023-10-02 MED ORDER — SODIUM CHLORIDE 0.9% IV SOLUTION
250.0000 mL | INTRAVENOUS | Status: DC
  Administered 2023-10-02: 250 mL via INTRAVENOUS

## 2023-10-02 NOTE — Patient Instructions (Signed)
 [facility] - A DEPT OF [place]  Discharge Instructions: Thank you for choosing [facility] to provide your oncology and hematology care.   If you have a lab appointment with the Cancer Center, please go directly to the Cancer Center and check in at the registration area.   Wear comfortable clothing and clothing appropriate for easy access to any Portacath or PICC line.   We strive to give you quality time with your provider. You may need to reschedule your appointment if you arrive late (15 or more minutes).  Arriving late affects you and other patients whose appointments are after yours.  Also, if you miss three or more appointments without notifying the office, you may be dismissed from the clinic at the provider's discretion.      For prescription refill requests, have your pharmacy contact our office and allow 72 hours for refills to be completed.    Today you received the following blood transfusion x2 units of PRBC.   To help prevent nausea and vomiting after your treatment, we encourage you to take your nausea medication as directed.  BELOW ARE SYMPTOMS THAT SHOULD BE REPORTED IMMEDIATELY: *FEVER GREATER THAN 100.4 F (38 C) OR HIGHER *CHILLS OR SWEATING *NAUSEA AND VOMITING THAT IS NOT CONTROLLED WITH YOUR NAUSEA MEDICATION *UNUSUAL SHORTNESS OF BREATH *UNUSUAL BRUISING OR BLEEDING *URINARY PROBLEMS (pain or burning when urinating, or frequent urination) *BOWEL PROBLEMS (unusual diarrhea, constipation, pain near the anus) TENDERNESS IN MOUTH AND THROAT WITH OR WITHOUT PRESENCE OF ULCERS (sore throat, sores in mouth, or a toothache) UNUSUAL RASH, SWELLING OR PAIN  UNUSUAL VAGINAL DISCHARGE OR ITCHING   Items with * indicate a potential emergency and should be followed up as soon as possible or go to the Emergency Department if any problems should occur.  Please show the CHEMOTHERAPY ALERT CARD or IMMUNOTHERAPY ALERT CARD at  check-in to the Emergency Department and triage nurse.  Should you have questions after your visit or need to cancel or reschedule your appointment, please contact [facility] - A DEPT OF [facility]  Dept: [phone]  and follow the prompts.  Office hours are 8:00 a.m. to 4:30 p.m. Monday - Friday. Please note that voicemails left after 4:00 p.m. may not be returned until the following business day.  We are closed weekends and major holidays. You have access to a nurse at all times for urgent questions. Please call the main number to the clinic Dept: [phone] and follow the prompts.   For any non-urgent questions, you may also contact your provider using MyChart. We now offer e-Visits for anyone 91 and older to request care online for non-urgent symptoms. For details visit [email or web address].   Also download the MyChart app! Go to the app store, search MyChart, open the app, select [place], and log in with your MyChart username and password.

## 2023-10-02 NOTE — Progress Notes (Signed)
 CRITICAL VALUE STICKER  CRITICAL VALUE: Hgb 5.9  RECEIVER (on-site recipient of call): Daray Polgar,RN  DATE & TIME NOTIFIED: 10/02/23 @ 1014  MESSENGER (representative from lab): Thersia  MD NOTIFIED: Olam Ned, NP  TIME OF NOTIFICATION: 1015  RESPONSE:  Receiving 2 units blood today.

## 2023-10-02 NOTE — Progress Notes (Signed)
 Patient presents today for blood transfusion of PRBC x2 units. Patient is in satisfactory condition with no new complaints voiced.  Vital signs are stable.  We will proceed with infusion per provider orders.    Patient tolerated treatment well with no complaints voiced.  Patient left via wheelchair with daughter, in stable condition.  Vital signs stable at discharge.  Follow up as scheduled.

## 2023-10-05 ENCOUNTER — Inpatient Hospital Stay

## 2023-10-05 ENCOUNTER — Other Ambulatory Visit: Payer: Self-pay | Admitting: Radiology

## 2023-10-05 ENCOUNTER — Encounter: Payer: Self-pay | Admitting: *Deleted

## 2023-10-05 ENCOUNTER — Other Ambulatory Visit: Payer: Self-pay | Admitting: *Deleted

## 2023-10-05 ENCOUNTER — Telehealth: Payer: Self-pay | Admitting: *Deleted

## 2023-10-05 DIAGNOSIS — D469 Myelodysplastic syndrome, unspecified: Secondary | ICD-10-CM

## 2023-10-05 DIAGNOSIS — Z170 Estrogen receptor positive status [ER+]: Secondary | ICD-10-CM

## 2023-10-05 DIAGNOSIS — D61818 Other pancytopenia: Secondary | ICD-10-CM | POA: Diagnosis not present

## 2023-10-05 LAB — TYPE AND SCREEN
ABO/RH(D): A NEG
Antibody Screen: NEGATIVE
Unit division: 0
Unit division: 0

## 2023-10-05 LAB — CBC WITH DIFFERENTIAL (CANCER CENTER ONLY)
Abs Immature Granulocytes: 0 K/uL (ref 0.00–0.07)
Basophils Absolute: 0 K/uL (ref 0.0–0.1)
Basophils Relative: 1 %
Eosinophils Absolute: 0 K/uL (ref 0.0–0.5)
Eosinophils Relative: 1 %
HCT: 24.1 % — ABNORMAL LOW (ref 36.0–46.0)
Hemoglobin: 7.7 g/dL — ABNORMAL LOW (ref 12.0–15.0)
Immature Granulocytes: 0 %
Lymphocytes Relative: 73 %
Lymphs Abs: 0.9 K/uL (ref 0.7–4.0)
MCH: 29.5 pg (ref 26.0–34.0)
MCHC: 32 g/dL (ref 30.0–36.0)
MCV: 92.3 fL (ref 80.0–100.0)
Monocytes Absolute: 0 K/uL — ABNORMAL LOW (ref 0.1–1.0)
Monocytes Relative: 3 %
Neutro Abs: 0.3 K/uL — CL (ref 1.7–7.7)
Neutrophils Relative %: 22 %
Platelet Count: 5 K/uL — CL (ref 150–400)
RBC: 2.61 MIL/uL — ABNORMAL LOW (ref 3.87–5.11)
RDW: 19 % — ABNORMAL HIGH (ref 11.5–15.5)
WBC Count: 1.2 K/uL — ABNORMAL LOW (ref 4.0–10.5)
nRBC: 0 % (ref 0.0–0.2)

## 2023-10-05 LAB — BPAM RBC
Blood Product Expiration Date: 202510122359
Blood Product Expiration Date: 202510172359
ISSUE DATE / TIME: 202509260632
ISSUE DATE / TIME: 202509260632
Unit Type and Rh: 600
Unit Type and Rh: 600

## 2023-10-05 LAB — SAMPLE TO BLOOD BANK

## 2023-10-05 MED ORDER — SODIUM CHLORIDE 0.9% IV SOLUTION
250.0000 mL | INTRAVENOUS | Status: DC
  Administered 2023-10-05: 100 mL via INTRAVENOUS

## 2023-10-05 NOTE — Progress Notes (Signed)
 CRITICAL VALUE STICKER  CRITICAL VALUE:platelet 5,000 and ANC 0.3  RECEIVER (on-site recipient of call):Kianni Lheureux,RN  DATE & TIME NOTIFIED: 10/05/23 @ 1509  MESSENGER (representative from lab):Alexis  MD NOTIFIED: Dr. Cloretta  TIME OF NOTIFICATION: 1511  RESPONSE: 1 unit platelets today and CBC tomorrow

## 2023-10-05 NOTE — Telephone Encounter (Signed)
 Called daughter to have them bring Cynthia [name] to [facility] now to receive a unit of platelets. They are at a very dangerous level. Informed her that Cynthia [name] should always stay here after lab is drawn and not leave to allow us  to transfuse prn. Notified scheduler to move all her afternoon appointments back to am as before and she needs am appointment on 11/05/23 as well and call daughter w/appointment time.

## 2023-10-05 NOTE — Progress Notes (Signed)
 Per [facility] infusion nurse, patients temp is 100.9-Is it OK to proceed with platelet infusion. Per Dr. Cloretta: May proceed. Remind patient to continue to take her antibiotics as ordered for home administration.

## 2023-10-05 NOTE — Patient Instructions (Addendum)
 Thrombocytopenia Thrombocytopenia means that you have a low number of platelets in your blood. Platelets are tiny cells in the blood. When you bleed, they clump together at the cut or injury to stop the bleeding. This is called blood clotting. If you do not have enough platelets, your blood may have trouble clotting. This may cause you to bleed and bruise very easily. What are the causes? This condition is caused by a low number of platelets in your blood. There are three main reasons for this: Your body not making enough platelets. This may be caused by: Bone marrow diseases. Disorders that are passed from parent to child (inherited). Certain cancer medicines or treatments. Infection from germs (bacteria or viruses). Alcoholism. Platelets not being released in the blood. This can be caused by: Having a spleen that is larger than normal. A condition called Gaucher disease. Your body destroying platelets too quickly. This may be caused by: Certain autoimmune diseases. Some medicines that thin your blood. Certain blood clotting disorders. Certain bleeding disorders. Exposure to harmful (toxic) chemicals. Pregnancy. What are the signs or symptoms? Bruising easily. Bleeding from the nose or mouth. Heavy menstrual periods. Blood in the pee (urine), poop (stool), or vomit. A purple-red color to the skin (purpura). A rash that looks like pinpoint, purple-red spots (petechiae) on the lower legs. How is this treated? Treatment depends on the cause. Treatment may include: Treatment of another condition that is causing the low platelet count. Medicines to help protect your platelets from being destroyed. A replacement (transfusion) of platelets to stop or prevent bleeding. Surgery to take out the spleen. Follow these instructions at home: Medicines Take over-the-counter and prescription medicines only as told by your doctor. Do not take any medicines that have aspirin or NSAIDs, such as  ibuprofen . Activity Avoid doing things that could hurt or bruise you. Take action to prevent falls. Do not play contact sports. Ask your doctor what activities are safe for you. Take care not to burn yourself: When you use an iron. When you cook. Take care not to cut yourself: When you shave. When you use scissors, needles, knives, or other tools. General instructions  Check your skin and the inside of your mouth for bruises or blood as told by your doctor. Wear a medical alert bracelet that says that you have a bleeding disorder. Check to see if there is blood in your pee and poop. Do this as told by your doctor. Do not drink alcohol. If you do drink, limit the amount that you drink. Stay away from harmful (toxic) chemicals. Tell all of your doctors that you have this condition. Be sure to tell your dentist and eye doctor. Tell your dentist about your condition before you have your teeth cleaned. Keep all follow-up visits. Contact a doctor if: You have bruises and you do not know why. You have new symptoms. You have symptoms that get worse. You have a fever. Get help right away if: You have very bad bleeding anywhere on your body. You have blood in your vomit, pee, or poop. You have an injury to your head. You have a sudden, very bad headache. Summary Thrombocytopenia means that you have a low number of platelets in your blood. Platelets stick together to form a clot. Symptoms of this condition include getting bruises easily, bleeding from the mouth and nose, a purple-red color to the skin, and a rash. Take care not to cut or burn yourself. This information is not intended to replace advice given  to you by your health care provider. Make sure you discuss any questions you have with your health care provider. Document Revised: 06/07/2020 Document Reviewed: 06/07/2020 Elsevier Patient Education  2024 [employer]  Getting Platelets Through an IV (Platelet Transfusion) in  Adults: What to Expect A platelet transfusion is when a person gets platelets from another person (donor) through an IV. Platelets are parts of blood that stick together and form a clot to help stop bleeding after an injury. If you don't have enough platelets, you might bleed or bruise easily. You may need a platelet transfusion if you have a health problem that causes a low number of platelets, such as thrombocytopenia. A platelet transfusion may be used to stop or prevent too much bleeding. Tell a health care provider about: Any reactions you've had during past transfusions. Any allergies you have. All medicines you take. These include vitamins, herbs, eye drops, and creams. Any bleeding problems you have. Any surgeries you've had. Any health problems you have. Whether you're pregnant or may be pregnant. What are the risks? Your health care provider will talk with you about risks. These may include: Fever or chills. This might happen if your body reacts to the new cells. It can happen during or up to 4 hours after the transfusion. A mild allergy. You might get red, swollen areas on your skin (hives). You might feel itchy. A bad allergy. You might have trouble breathing or swelling around your face and lips. Very bad reactions are rare but can happen. These may be: Infection. This is rare because donated blood is carefully tested. Hemolytic reaction. This is when the defense system (immune system) of your body attacks the new cells. Symptoms include fever, chills, and a feeling that you may throw up. You may also have low blood pressure and pain in your chest or lower back. Transfusion-associated circulatory overload (TACO). This happens if fluids move to your lungs. This may cause breathing problems. Transfusion-related acute lung injury (TRALI). TRALI can cause breathing problems and low oxygen in your blood. This can happen within hours or days after the  transfusion. Transfusion-associated graft-versus-host disease (TAGVHD). This happens when donated cells attack the body's healthy tissues. What happens before? Take your medicines only as told. You will have a blood test to find out your blood type. This helps match the donor blood with your blood. If you've had an allergy to a transfusion in the past, you may be given medicine to help prevent another allergy. Your temperature, blood pressure, pulse, and breathing will be checked. What happens during platelet transfusion?  An IV will be put into a vein in your hand or arm. For your safety, two health care team members will check your identity and the donor platelets. The bag of donor platelets will be connected to your IV. The platelets will flow into your blood. This usually takes 30-60 minutes. Your temperature, blood pressure, pulse, and breathing will be checked. This helps catch signs of a reaction early. You will be watched for symptoms of all types of reactions, like chills, hives, or itching. If you show signs of a reaction, the transfusion will be stopped. You may be given medicine to help treat the reaction. When your transfusion is done, your IV will be removed. Pressure may be put on the IV site for a few minutes to stop any bleeding. The IV site will be covered with a bandage. These steps may vary. Ask what you can expect. What happens after?  You will be watched closely until you leave. This includes checking your blood pressure, temperature, pulse rate, and breathing rate again. This information is not intended to replace advice given to you by your health care provider. Make sure you discuss any questions you have with your health care provider. Document Revised: 04/10/2023 Document Reviewed: 04/10/2023 Elsevier Patient Education  2025 [employer].

## 2023-10-06 ENCOUNTER — Telehealth: Payer: Self-pay | Admitting: *Deleted

## 2023-10-06 ENCOUNTER — Inpatient Hospital Stay

## 2023-10-06 ENCOUNTER — Other Ambulatory Visit: Payer: Self-pay | Admitting: Radiology

## 2023-10-06 DIAGNOSIS — D469 Myelodysplastic syndrome, unspecified: Secondary | ICD-10-CM

## 2023-10-06 DIAGNOSIS — Z853 Personal history of malignant neoplasm of breast: Secondary | ICD-10-CM | POA: Diagnosis not present

## 2023-10-06 LAB — CBC WITH DIFFERENTIAL (CANCER CENTER ONLY)
Abs Immature Granulocytes: 0.01 K/uL (ref 0.00–0.07)
Basophils Absolute: 0 K/uL (ref 0.0–0.1)
Basophils Relative: 1 %
Eosinophils Absolute: 0 K/uL (ref 0.0–0.5)
Eosinophils Relative: 3 %
HCT: 22.8 % — ABNORMAL LOW (ref 36.0–46.0)
Hemoglobin: 7.4 g/dL — ABNORMAL LOW (ref 12.0–15.0)
Immature Granulocytes: 1 %
Lymphocytes Relative: 81 %
Lymphs Abs: 0.9 K/uL (ref 0.7–4.0)
MCH: 29.7 pg (ref 26.0–34.0)
MCHC: 32.5 g/dL (ref 30.0–36.0)
MCV: 91.6 fL (ref 80.0–100.0)
Monocytes Absolute: 0 K/uL — ABNORMAL LOW (ref 0.1–1.0)
Monocytes Relative: 2 %
Neutro Abs: 0.1 K/uL — CL (ref 1.7–7.7)
Neutrophils Relative %: 12 %
Platelet Count: 16 K/uL — ABNORMAL LOW (ref 150–400)
RBC: 2.49 MIL/uL — ABNORMAL LOW (ref 3.87–5.11)
RDW: 18.7 % — ABNORMAL HIGH (ref 11.5–15.5)
WBC Count: 1.1 K/uL — ABNORMAL LOW (ref 4.0–10.5)
nRBC: 0 % (ref 0.0–0.2)

## 2023-10-06 LAB — PREPARE PLATELET PHERESIS: Unit division: 0

## 2023-10-06 LAB — BPAM PLATELET PHERESIS
Blood Product Expiration Date: 202510012359
ISSUE DATE / TIME: 202509291645
Unit Type and Rh: 6200

## 2023-10-06 NOTE — Telephone Encounter (Signed)
 CRITICAL VALUE STICKER  CRITICAL VALUE: ANC 0.1  RECEIVER (on-site recipient of call): Evanie Buckle,RN  DATE & TIME NOTIFIED: 10/06/23 @ 1246  MESSENGER (representative from lab):Thersia  MD NOTIFIED: Olam Ned, NP  TIME OF NOTIFICATION:1250  RESPONSE: Continue antibiotics, call for fever, check CBC with IR tomorrow.  Spoke with patient/daughter in lobby re: CBC results today. Reminded her to call for fever, s/s of infection. Continue the antibiotics. MD in IR agrees to check CBC tomorrow before procedure, but he was OK with the 16,000 platelet count and ANC 0.1. Patient requesting all her labs be collected at [facility] in the future, except when she needs to see MD or NP. Since blood bank is on site, she does not need to come in as early and she feels chairs are more comfortable and she wants to watch TV. Left message for [facility] manager to discuss w/[facility] manager to see if they can accommodate her.  [facility] will still order/monitor labs and order blood products. [name] closer to them and they are willing to go there if possible. LVM w/[facility] at [facility] to inquire if they have blood bank on site.

## 2023-10-06 NOTE — H&P (Signed)
 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 female with past medical history significant for anxiety, arthritis, right breast and urothelial cancers, depression, GERD, hypertension, hyperlipidemia, hypothyroidism, IBS, diabetes, skin cancer, atrial fibrillation/flutter, NSVT, as well as progressive severe pancytopenia and MDS-IB1.  She is scheduled today for Port-A-Cath placement to assist with treatment.  *** Patient is Full Code  Past Medical History:  Diagnosis Date   Anemia    Anticoagulant long-term use    eliquis --- managed by cardiology   Anxiety    Arthritis    Bladder cancer (HCC)    Bradycardia    Breast cancer (HCC)    left, s/p lumpectomy, Dr Layla   Cancer of kidney [facility])    left   DDD (degenerative disc disease), lumbar    Depression    Dyspnea    ON EXERTION   First degree heart block    Genetic testing 06/25/2016   Ms. Thomann underwent genetic counseling and testing for hereditary cancer syndromes on 06/17/2016. Her results were negative for mutations in all 46 genes analyzed by [facility]'s 46-gene Common Hereditary Cancers Panel. Genes analyzed include: APC, ATM, AXIN2, BARD1, BMPR1A, BRCA1, BRCA2, BRIP1, CDH1, CDKN2A, CHEK2, CTNNA1, DICER1, EPCAM, GREM1, HOXB13, KIT, MEN1, MLH1, MSH2, MSH3, MSH6, MUTYH, NBN,   GERD (gastroesophageal reflux disease)    occasional,  takes pepcid    Hematuria    History of radiation therapy 05/22/2016 to 06/20/2016   right breast cancer   Hyperlipidemia    Hypertension    followd by pcp---  (02-22-2019 per pt never had stress test)   Hypothyroidism    followed by pcp   IBS (irritable bowel syndrome)    Incomplete right bundle  branch block    Insomnia    Malignant neoplasm of upper-inner quadrant of right breast in female, estrogen receptor positive [facility]) oncologist--- dr layla   dx 03/ 2018,  Stage IA, IDC, ER/PR +;  04-01-2016 s/p  right breast lumpectomy w/ node dissection's;  completed radiation 06-20-2016   MVP (mitral valve prolapse)    per echo 12-11-2018 in epic, mild    Neuromuscular disorder (HCC)    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   NSVT (nonsustained ventricular tachycardia) (HCC) followed by cardiology   12-10-2018  hospital admission ,  refer to discharge note 12-14-2018 for treatement   PAF (paroxysmal atrial fibrillation) [facility]) primary cardiologist--- dr loni   newly dx 12-10-2018  admission in epic, Afib w/ RVR and NSVT;   in epic TTE 12-11-2018 showed ef 50-55%, mild MVP,  mild AV sclerosis without stenosis;   Cardiac MRI 12-13-2018 in epic   Pneumonia    Pre-diabetes    Prediabetes    Renal mass, left    pelvis    Restless leg syndrome    occasional   RLS (restless legs syndrome)    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   right buccal cheek tx mohs 07/30/2023    Past Surgical History:  Procedure Laterality Date   BREAST LUMPECTOMY WITH RADIOACTIVE SEED AND SENTINEL LYMPH NODE BIOPSY Right 04/01/2016   Procedure: RADIOACTIVE SEED GUIDED RIGHT BREAST LUMPECTOMY WITH RIGHT AXILLARY SENTINEL LYMPH NODE BIOPSY.;  Surgeon: Elon Pacini, MD;  Location: [facility] OR;  Service: General;  Laterality: Right;   CARDIAC CATHETERIZATION  12/11/2018   CATARACT EXTRACTION W/ INTRAOCULAR LENS  IMPLANT, BILATERAL  1980s   COLONOSCOPY     CYSTOSCOPY W/ RETROGRADES Right 06/05/2021   Procedure: CYSTOSCOPY WITH RETROGRADE PYELOGRAM;  Surgeon: Alvaro Hummer, MD;  Location: [facility] ORS;  Service: Urology;  Laterality: Right;   CYSTOSCOPY W/ RETROGRADES Right 07/08/2023   Procedure: CYSTOSCOPY, WITH RETROGRADE PYELOGRAM;  Surgeon: Alvaro Hummer KATHEE Mickey., MD;  Location: [facility];  Service: Urology;  Laterality:  Right;   CYSTOSCOPY/RETROGRADE/URETEROSCOPY N/A 03/02/2019   Procedure: CYSTOSCOPY/LEFT RETROGRADE/URETEROSCOPY/  BIOPSY/  STENT;  Surgeon: Devere Lonni Righter, MD;  Location: [facility];  Service: Urology;  Laterality: N/A;   INCISIONAL HERNIA REPAIR N/A 03/29/2020   Procedure: REPAIR OF INCISIONAL HERNIA WITH MESH;  Surgeon: Vanderbilt Ned, MD;  Location: [facility] OR;  Service: General;  Laterality: N/A;   IR IMAGING GUIDED PORT INSERTION  07/18/2021   IR REMOVAL TUN ACCESS W/ PORT W/O FL MOD SED  11/26/2022   LAPAROSCOPIC CHOLECYSTECTOMY  1990s   LOOP RECORDER INSERTION     Left upper chest, now at EOL, will replace when Ablation is done   ROBOT ASSITED LAPAROSCOPIC NEPHROURETERECTOMY Left 04/27/2019   Procedure: XI ROBOT ASSITED LAPAROSCOPIC NEPHROURETERECTOMY;  Surgeon: Alvaro Hummer, MD;  Location: [facility];  Service: Urology;  Laterality: Left;  3.5 HRS   TONSILLECTOMY  age 89   TRANSURETHRAL RESECTION OF BLADDER TUMOR N/A 06/05/2021   Procedure: TRANSURETHRAL RESECTION OF BLADDER TUMOR (TURBT);  Surgeon: Alvaro Hummer, MD;  Location: [facility];  Service: Urology;  Laterality: N/A;   TRANSURETHRAL RESECTION OF BLADDER TUMOR Right 07/08/2023   Procedure: TURBT (TRANSURETHRAL RESECTION OF BLADDER TUMOR);  Surgeon: Alvaro Hummer KATHEE Mickey., MD;  Location: [facility];  Service: Urology;  Laterality: Right;    Allergies: Patient has no known allergies.  Medications: Prior to Admission medications   Medication Sig Start Date End Date Taking? Authorizing Provider  acetaminophen  (TYLENOL ) 650 MG CR tablet Take 1,300 mg by mouth at bedtime. Patient taking differently: Take 2,600 mg by mouth 2 (two) times daily.    [provider]  acyclovir  (ZOVIRAX ) 400 MG tablet Take 1 tablet (400 mg total) by mouth 2 (two) times daily. 10/02/23   Ned Olam POUR, NP  ALPRAZolam  (XANAX ) 0.5 MG tablet Take 0.5-1 mg by mouth See admin instructions. Take 2 tablets (1 mg) by mouth scheduled every  night at bedtime, may take an additional dose (0.5 mg) in 4 hours if still unable to sleep. 04/04/21   [provider]  amiodarone  (PACERONE ) 200 MG tablet Take 1 tablet (200 mg total) by mouth daily. 06/26/23   Kennyth Chew, MD  carboxymethylcellulose (REFRESH PLUS) 0.5 % SOLN Place 1 drop into both eyes in the morning and at bedtime.    [provider]  Cholecalciferol  (VITAMIN D -3) 25 MCG (1000 UT) CAPS Take 2,000 Units by mouth daily with breakfast. Patient taking differently: Take 2,000 Units by mouth every other day.    [provider]  Choline Fenofibrate  (FENOFIBRIC ACID) 135 MG CPDR Take 135 mg by mouth daily. 01/16/16   [provider]  decitabine -cedazuridine  (INQOVI ) 35-100 MG oral tablet Take 1 tablet by mouth daily. Take for 5 days, hold for 23d, repeat every 28d. Take on an empty stomach, at least 2 hr before or after food 09/21/23   Cloretta Arley KATHEE, MD  ferrous sulfate  325 (65 FE) MG tablet Take 325 mg by mouth at bedtime.    [provider]  fluconazole  (DIFLUCAN ) 200 MG  tablet Take 1 tablet (200 mg total) by mouth daily. 10/02/23   Debby Olam POUR, NP  gabapentin  (NEURONTIN ) 100 MG capsule Take 100 mg by mouth at bedtime. Patient taking differently: Take 100 mg by mouth at bedtime. 200 mg QHS 03/26/22   [provider]  levothyroxine  (SYNTHROID , LEVOTHROID) 112 MCG tablet Take 112 mcg by mouth daily. 03/13/16   [provider]  magnesium  oxide (MAG-OX) 400 MG tablet Take 241 mg by mouth daily. 06/26/23   [provider]  MAGNESIUM  PO Take 250 mg by mouth every evening.    [provider]  omeprazole (PRILOSEC) 40 MG capsule Take 40 mg by mouth daily before breakfast.    [provider]  prochlorperazine  (COMPAZINE ) 5 MG tablet Take 1 tablet (5 mg total) by mouth every 6 (six) hours as needed for nausea or vomiting. 09/18/23   Debby Olam POUR, NP  sulfamethoxazole -trimethoprim  (BACTRIM  DS) 800-160 MG  tablet Take 1 tablet by mouth 3 (three) times a week. 10/02/23   Debby Olam POUR, NP  traMADol -acetaminophen  (ULTRACET ) 37.5-325 MG tablet Take 1 tablet by mouth daily as needed. Patient not taking: Reported on 10/02/2023 09/21/23   Cloretta Arley NOVAK, MD  traZODone  (DESYREL ) 100 MG tablet Take 50 mg by mouth at bedtime.    [provider]     Family History  Problem Relation Age of Onset   Congestive Heart Failure Mother    Breast cancer Sister 48       d.54   Cervical cancer Sister 58   Leukemia Brother 48   Leukemia Son    Ovarian cancer Maternal Aunt 69       d.92s   Breast cancer Other 45   Restless legs syndrome Neg Hx    Sleep apnea Neg Hx     Social History   Socioeconomic History   Marital status: Widowed    Spouse name: Not on file   Number of children: Not on file   Years of education: Not on file   Highest education level: Not on file  Occupational History   Not on file  Tobacco Use   Smoking status: Never    Passive exposure: Past   Smokeless tobacco: Never  Vaping Use   Vaping status: Never Used  Substance and Sexual Activity   Alcohol use: Not Currently    Comment: 4 or 5 mixed drinks per year   Drug use: Never   Sexual activity: Not Currently    Birth control/protection: Post-menopausal  Other Topics Concern   Not on file  Social History Narrative   Caffeine: max of 1-1.5 cokes per day    Right handed   Social Drivers of Health   Financial Resource Strain: Not on file  Food Insecurity: No Food Insecurity (02/13/2022)   Hunger Vital Sign    Worried About Running Out of Food in the Last Year: Never true    Ran Out of Food in the Last Year: Never true  Transportation Needs: No Transportation Needs (02/13/2022)   PRAPARE - Administrator, Civil Service (Medical): No    Lack of Transportation (Non-Medical): No  Physical Activity: Not on file  Stress: Not on file  Social Connections: Unknown (05/21/2021)   [facility] from [facility]    Social Network    Social Network: Not on file       Review of Systems  Vital Signs:   Advance Care Placement:   Physical Exam  Imaging: No results found.  Labs:  CBC: Recent Labs    10/01/23 1035 10/02/23 0910 10/05/23 1427 10/06/23 1157  WBC 1.9* 2.0* 1.2* 1.1*  HGB 6.5* 5.9* 7.7* 7.4*  HCT 20.7* 19.0* 24.1* 22.8*  PLT 11* 17* 5* 16*    COAGS: No results for input(s): INR, APTT in the last 8760 hours.  BMP: Recent Labs    08/07/23 1324 09/17/23 1349 09/22/23 1515 10/01/23 1035  NA 140 135 139 136  K 4.3 3.8 4.4 3.9  CL 103 100 102 99  CO2 25 25 27 28   GLUCOSE 112* 108* 105* 114*  BUN 24* 24* 20 22  CALCIUM  9.3 9.2 9.3 9.9  CREATININE 1.28* 1.14* 1.11* 0.96  GFRNONAA 42* 48* 50* 59*    LIVER FUNCTION TESTS: Recent Labs    08/07/23 1324 09/17/23 1349 09/22/23 1515 10/01/23 1035  BILITOT 0.6 0.7 1.1 0.9  AST 23 25 29 27   ALT 12 11 14 13   ALKPHOS 45 47 45 44  PROT 6.6 6.6 6.7 6.5  ALBUMIN  3.9 3.9 4.0 3.7    TUMOR MARKERS: No results for input(s): AFPTM, CEA, CA199, CHROMGRNA in the last 8760 hours.  Assessment and Plan: 78 y.o. female with past medical history significant for anxiety, arthritis, right breast and urothelial cancers, depression, GERD, hypertension, hyperlipidemia, hypothyroidism, IBS, diabetes, skin cancer, atrial fibrillation/flutter, NSVT, as well as progressive severe pancytopenia and MDS-IB1.  She is scheduled today for Port-A-Cath placement to assist with treatment.Risks and benefits of image guided port-a-catheter placement was discussed with the patient including, but not limited to bleeding, infection, pneumothorax, or fibrin sheath development and need for additional procedures.  All of the patient's questions were answered, patient is agreeable to proceed. Consent signed and in chart.  She is known to IR team from Port-A-Cath placement in 2023 with removal in 2024 and bone marrow biopsy on  08/28/2023  Thank you for allowing our service to participate in Cynthia [name] 's care.  Electronically Signed: D. Franky Rakers, PA-C   10/06/2023, 4:35 PM      I spent a total of  20 minutes   in face to face in clinical consultation, greater than 50% of which was counseling/coordinating care for port a cath placement

## 2023-10-07 ENCOUNTER — Ambulatory Visit (HOSPITAL_COMMUNITY)
Admission: RE | Admit: 2023-10-07 | Discharge: 2023-10-07 | Disposition: A | Discharge status: Home / Self Care | Source: Ambulatory Visit | Attending: Oncology | Admitting: Oncology

## 2023-10-07 ENCOUNTER — Other Ambulatory Visit: Payer: Self-pay

## 2023-10-07 ENCOUNTER — Encounter (HOSPITAL_COMMUNITY): Payer: Self-pay

## 2023-10-07 ENCOUNTER — Ambulatory Visit (HOSPITAL_COMMUNITY): Admit: 2023-10-07 | Admitting: Cardiology

## 2023-10-07 ENCOUNTER — Ambulatory Visit (HOSPITAL_COMMUNITY)
Admission: RE | Admit: 2023-10-07 | Discharge: 2023-10-07 | Disposition: A | Source: Ambulatory Visit | Attending: Oncology

## 2023-10-07 DIAGNOSIS — E785 Hyperlipidemia, unspecified: Secondary | ICD-10-CM | POA: Insufficient documentation

## 2023-10-07 DIAGNOSIS — D61810 Antineoplastic chemotherapy induced pancytopenia: Secondary | ICD-10-CM | POA: Diagnosis present

## 2023-10-07 DIAGNOSIS — I472 Ventricular tachycardia, unspecified: Secondary | ICD-10-CM | POA: Insufficient documentation

## 2023-10-07 DIAGNOSIS — F419 Anxiety disorder, unspecified: Secondary | ICD-10-CM | POA: Diagnosis present

## 2023-10-07 DIAGNOSIS — I480 Paroxysmal atrial fibrillation: Secondary | ICD-10-CM | POA: Insufficient documentation

## 2023-10-07 DIAGNOSIS — A419 Sepsis, unspecified organism: Secondary | ICD-10-CM | POA: Diagnosis present

## 2023-10-07 DIAGNOSIS — J690 Pneumonitis due to inhalation of food and vomit: Secondary | ICD-10-CM | POA: Diagnosis not present

## 2023-10-07 DIAGNOSIS — R918 Other nonspecific abnormal finding of lung field: Secondary | ICD-10-CM | POA: Diagnosis not present

## 2023-10-07 DIAGNOSIS — E119 Type 2 diabetes mellitus without complications: Secondary | ICD-10-CM | POA: Insufficient documentation

## 2023-10-07 DIAGNOSIS — G2581 Restless legs syndrome: Secondary | ICD-10-CM | POA: Diagnosis present

## 2023-10-07 DIAGNOSIS — Z85828 Personal history of other malignant neoplasm of skin: Secondary | ICD-10-CM | POA: Diagnosis not present

## 2023-10-07 DIAGNOSIS — R0989 Other specified symptoms and signs involving the circulatory and respiratory systems: Secondary | ICD-10-CM | POA: Diagnosis not present

## 2023-10-07 DIAGNOSIS — Z7401 Bed confinement status: Secondary | ICD-10-CM | POA: Diagnosis not present

## 2023-10-07 DIAGNOSIS — K219 Gastro-esophageal reflux disease without esophagitis: Secondary | ICD-10-CM | POA: Diagnosis present

## 2023-10-07 DIAGNOSIS — Z853 Personal history of malignant neoplasm of breast: Secondary | ICD-10-CM | POA: Insufficient documentation

## 2023-10-07 DIAGNOSIS — I10 Essential (primary) hypertension: Secondary | ICD-10-CM | POA: Insufficient documentation

## 2023-10-07 DIAGNOSIS — R0602 Shortness of breath: Secondary | ICD-10-CM | POA: Diagnosis not present

## 2023-10-07 DIAGNOSIS — Q631 Lobulated, fused and horseshoe kidney: Secondary | ICD-10-CM | POA: Diagnosis not present

## 2023-10-07 DIAGNOSIS — Z452 Encounter for adjustment and management of vascular access device: Secondary | ICD-10-CM | POA: Diagnosis not present

## 2023-10-07 DIAGNOSIS — R509 Fever, unspecified: Secondary | ICD-10-CM | POA: Diagnosis not present

## 2023-10-07 DIAGNOSIS — D61818 Other pancytopenia: Secondary | ICD-10-CM | POA: Diagnosis not present

## 2023-10-07 DIAGNOSIS — R5081 Fever presenting with conditions classified elsewhere: Secondary | ICD-10-CM | POA: Diagnosis present

## 2023-10-07 DIAGNOSIS — Z79899 Other long term (current) drug therapy: Secondary | ICD-10-CM | POA: Insufficient documentation

## 2023-10-07 DIAGNOSIS — D849 Immunodeficiency, unspecified: Secondary | ICD-10-CM | POA: Diagnosis present

## 2023-10-07 DIAGNOSIS — Z7901 Long term (current) use of anticoagulants: Secondary | ICD-10-CM | POA: Diagnosis not present

## 2023-10-07 DIAGNOSIS — R531 Weakness: Secondary | ICD-10-CM | POA: Diagnosis not present

## 2023-10-07 DIAGNOSIS — J984 Other disorders of lung: Secondary | ICD-10-CM | POA: Diagnosis not present

## 2023-10-07 DIAGNOSIS — D469 Myelodysplastic syndrome, unspecified: Secondary | ICD-10-CM | POA: Diagnosis present

## 2023-10-07 DIAGNOSIS — D4621 Refractory anemia with excess of blasts 1: Secondary | ICD-10-CM | POA: Diagnosis not present

## 2023-10-07 DIAGNOSIS — D696 Thrombocytopenia, unspecified: Secondary | ICD-10-CM | POA: Diagnosis not present

## 2023-10-07 DIAGNOSIS — J189 Pneumonia, unspecified organism: Secondary | ICD-10-CM | POA: Diagnosis present

## 2023-10-07 DIAGNOSIS — E039 Hypothyroidism, unspecified: Secondary | ICD-10-CM | POA: Diagnosis present

## 2023-10-07 DIAGNOSIS — E1122 Type 2 diabetes mellitus with diabetic chronic kidney disease: Secondary | ICD-10-CM | POA: Diagnosis present

## 2023-10-07 DIAGNOSIS — Z79890 Hormone replacement therapy (postmenopausal): Secondary | ICD-10-CM | POA: Insufficient documentation

## 2023-10-07 DIAGNOSIS — N1831 Chronic kidney disease, stage 3a: Secondary | ICD-10-CM | POA: Diagnosis present

## 2023-10-07 DIAGNOSIS — D709 Neutropenia, unspecified: Secondary | ICD-10-CM | POA: Diagnosis present

## 2023-10-07 DIAGNOSIS — Z515 Encounter for palliative care: Secondary | ICD-10-CM | POA: Diagnosis not present

## 2023-10-07 DIAGNOSIS — Z1152 Encounter for screening for COVID-19: Secondary | ICD-10-CM | POA: Diagnosis not present

## 2023-10-07 DIAGNOSIS — Z7189 Other specified counseling: Secondary | ICD-10-CM | POA: Diagnosis not present

## 2023-10-07 DIAGNOSIS — Z66 Do not resuscitate: Secondary | ICD-10-CM | POA: Diagnosis present

## 2023-10-07 DIAGNOSIS — I129 Hypertensive chronic kidney disease with stage 1 through stage 4 chronic kidney disease, or unspecified chronic kidney disease: Secondary | ICD-10-CM | POA: Diagnosis present

## 2023-10-07 DIAGNOSIS — Z8249 Family history of ischemic heart disease and other diseases of the circulatory system: Secondary | ICD-10-CM | POA: Diagnosis not present

## 2023-10-07 DIAGNOSIS — I517 Cardiomegaly: Secondary | ICD-10-CM | POA: Diagnosis not present

## 2023-10-07 DIAGNOSIS — D6189 Other specified aplastic anemias and other bone marrow failure syndromes: Secondary | ICD-10-CM | POA: Diagnosis not present

## 2023-10-07 HISTORY — PX: IR IMAGING GUIDED PORT INSERTION: IMG5740

## 2023-10-07 LAB — COMPREHENSIVE METABOLIC PANEL WITH GFR
ALT: 15 U/L (ref 0–44)
AST: 26 U/L (ref 15–41)
Albumin: 4 g/dL (ref 3.5–5.0)
Alkaline Phosphatase: 44 U/L (ref 38–126)
Anion gap: 11 (ref 5–15)
BUN: 21 mg/dL (ref 8–23)
CO2: 26 mmol/L (ref 22–32)
Calcium: 9.1 mg/dL (ref 8.9–10.3)
Chloride: 101 mmol/L (ref 98–111)
Creatinine, Ser: 0.97 mg/dL (ref 0.44–1.00)
GFR, Estimated: 59 mL/min — ABNORMAL LOW (ref >60)
Glucose, Bld: 81 mg/dL (ref 70–99)
Potassium: 4.1 mmol/L (ref 3.5–5.1)
Sodium: 138 mmol/L (ref 135–145)
Total Bilirubin: 0.9 mg/dL (ref 0.0–1.2)
Total Protein: 6.7 g/dL (ref 6.5–8.1)

## 2023-10-07 LAB — PROTIME-INR
INR: 1.2 (ref 0.8–1.2)
Prothrombin Time: 16.2 s — ABNORMAL HIGH (ref 11.4–15.2)

## 2023-10-07 SURGERY — ATRIAL FIBRILLATION ABLATION
Anesthesia: General

## 2023-10-07 MED ORDER — HEPARIN SOD (PORK) LOCK FLUSH 100 UNIT/ML IV SOLN
500.0000 [IU] | Freq: Once | INTRAVENOUS | Status: AC
  Administered 2023-10-07: 500 [IU] via INTRAVENOUS

## 2023-10-07 MED ORDER — MIDAZOLAM HCL 2 MG/2ML IJ SOLN
INTRAMUSCULAR | Status: AC | PRN
  Administered 2023-10-07 (×3): .5 mg via INTRAVENOUS

## 2023-10-07 MED ORDER — HEPARIN SOD (PORK) LOCK FLUSH 100 UNIT/ML IV SOLN
INTRAVENOUS | Status: AC
  Filled 2023-10-07: qty 5

## 2023-10-07 MED ORDER — CEFAZOLIN SODIUM-DEXTROSE 2-4 GM/100ML-% IV SOLN
INTRAVENOUS | Status: AC | PRN
  Administered 2023-10-07: 2 g via INTRAVENOUS

## 2023-10-07 MED ORDER — CEFAZOLIN SODIUM-DEXTROSE 2-4 GM/100ML-% IV SOLN
INTRAVENOUS | Status: AC
  Filled 2023-10-07: qty 100

## 2023-10-07 MED ORDER — CEFAZOLIN SODIUM-DEXTROSE 2-4 GM/100ML-% IV SOLN: 2.0000 g | INTRAVENOUS | Status: DC

## 2023-10-07 MED ORDER — MIDAZOLAM HCL 2 MG/2ML IJ SOLN
INTRAMUSCULAR | Status: AC
  Filled 2023-10-07: qty 2

## 2023-10-07 MED ORDER — FENTANYL CITRATE (PF) 100 MCG/2ML IJ SOLN
INTRAMUSCULAR | Status: AC | PRN
  Administered 2023-10-07 (×2): 25 ug via INTRAVENOUS

## 2023-10-07 MED ORDER — LIDOCAINE-EPINEPHRINE 1 %-1:100000 IJ SOLN
20.0000 mL | Freq: Once | INTRAMUSCULAR | Status: AC
  Administered 2023-10-07: 20 mL via INTRADERMAL

## 2023-10-07 MED ORDER — SODIUM CHLORIDE 0.9 % IV SOLN: INTRAVENOUS | Status: DC

## 2023-10-07 MED ORDER — LIDOCAINE-EPINEPHRINE 1 %-1:100000 IJ SOLN
INTRAMUSCULAR | Status: AC
  Filled 2023-10-07: qty 1

## 2023-10-07 MED ORDER — SODIUM CHLORIDE 0.9% IV SOLUTION
Freq: Once | INTRAVENOUS | Status: DC
Start: 2023-10-07 — End: 2023-10-08

## 2023-10-07 MED ORDER — FENTANYL CITRATE (PF) 100 MCG/2ML IJ SOLN
INTRAMUSCULAR | Status: AC
  Filled 2023-10-07: qty 2

## 2023-10-07 NOTE — Discharge Instructions (Signed)

## 2023-10-07 NOTE — Progress Notes (Signed)
 Preliminary results from CBC showed significant results - WBC @ 1.2, HGB @ 6.9, PLT @ 10. Concern for proceeding with port a cath placement voiced by this RN and Harlene Georgia, IR Tech. Per MD Mugweru, the pt's oncology team is aware and comfortable with moving forward with the procedure.

## 2023-10-07 NOTE — Procedures (Signed)
 Vascular and Interventional Radiology Procedure Note  Patient: Cynthia [name] DOB: April 18, 1945 Medical Record Number: [id number] Note Date/Time: 10/07/23 2:42 PM   Performing Physician: Thom Hall, MD Assistant(s): None  Diagnosis: MDS.  Procedure: PORT PLACEMENT  Anesthesia: Conscious Sedation Complications: None Estimated Blood Loss: Minimal  Findings:  Successful right-sided port placement, with the tip of the catheter in the proximal right atrium.  Plan: Catheter ready for use.  See detailed procedure note with images in PACS. The patient tolerated the procedure well without incident or complication and was returned to Recovery in stable condition.    Thom Hall, MD [facility] Radiology   Pager. [phone] Clinic. [phone]

## 2023-10-08 ENCOUNTER — Inpatient Hospital Stay

## 2023-10-08 ENCOUNTER — Other Ambulatory Visit

## 2023-10-08 ENCOUNTER — Inpatient Hospital Stay: Attending: Oncology

## 2023-10-08 ENCOUNTER — Other Ambulatory Visit: Payer: Self-pay

## 2023-10-08 DIAGNOSIS — Z170 Estrogen receptor positive status [ER+]: Secondary | ICD-10-CM

## 2023-10-08 DIAGNOSIS — D469 Myelodysplastic syndrome, unspecified: Secondary | ICD-10-CM

## 2023-10-08 LAB — CBC WITH DIFFERENTIAL/PLATELET
Abs Immature Granulocytes: 0 K/uL (ref 0.00–0.07)
Basophils Absolute: 0 K/uL (ref 0.0–0.1)
Basophils Relative: 1 %
Eosinophils Absolute: 0 K/uL (ref 0.0–0.5)
Eosinophils Relative: 3 %
HCT: 22.4 % — ABNORMAL LOW (ref 36.0–46.0)
Hemoglobin: 6.9 g/dL — CL (ref 12.0–15.0)
Immature Granulocytes: 0 %
Lymphocytes Relative: 90 %
Lymphs Abs: 1.1 K/uL (ref 0.7–4.0)
MCH: 29 pg (ref 26.0–34.0)
MCHC: 30.8 g/dL (ref 30.0–36.0)
MCV: 94.1 fL (ref 80.0–100.0)
Monocytes Absolute: 0 K/uL — ABNORMAL LOW (ref 0.1–1.0)
Monocytes Relative: 1 %
Neutro Abs: 0.1 K/uL — CL (ref 1.7–7.7)
Neutrophils Relative %: 5 %
Platelets: 10 K/uL — CL (ref 150–400)
RBC: 2.38 MIL/uL — ABNORMAL LOW (ref 3.87–5.11)
RDW: 18.8 % — ABNORMAL HIGH (ref 11.5–15.5)
Smear Review: NORMAL
WBC: 1.2 K/uL — CL (ref 4.0–10.5)
nRBC: 0 % (ref 0.0–0.2)

## 2023-10-08 LAB — CBC WITH DIFFERENTIAL (CANCER CENTER ONLY)
Abs Immature Granulocytes: 0 K/uL (ref 0.00–0.07)
Basophils Absolute: 0 K/uL (ref 0.0–0.1)
Basophils Relative: 0 %
Eosinophils Absolute: 0 K/uL (ref 0.0–0.5)
Eosinophils Relative: 3 %
HCT: 19.6 % — ABNORMAL LOW (ref 36.0–46.0)
Hemoglobin: 6.3 g/dL — CL (ref 12.0–15.0)
Immature Granulocytes: 0 %
Lymphocytes Relative: 89 %
Lymphs Abs: 0.8 K/uL (ref 0.7–4.0)
MCH: 29.3 pg (ref 26.0–34.0)
MCHC: 32.1 g/dL (ref 30.0–36.0)
MCV: 91.2 fL (ref 80.0–100.0)
Monocytes Absolute: 0 K/uL — ABNORMAL LOW (ref 0.1–1.0)
Monocytes Relative: 1 %
Neutro Abs: 0.1 K/uL — CL (ref 1.7–7.7)
Neutrophils Relative %: 7 %
Platelet Count: 10 K/uL — ABNORMAL LOW (ref 150–400)
RBC: 2.15 MIL/uL — ABNORMAL LOW (ref 3.87–5.11)
RDW: 18.8 % — ABNORMAL HIGH (ref 11.5–15.5)
WBC Count: 1 K/uL — ABNORMAL LOW (ref 4.0–10.5)
nRBC: 0 % (ref 0.0–0.2)

## 2023-10-08 LAB — PREPARE PLATELET PHERESIS: Unit division: 0

## 2023-10-08 LAB — BPAM PLATELET PHERESIS
Blood Product Expiration Date: 202510022359
ISSUE DATE / TIME: 202510011549
Unit Type and Rh: 5100

## 2023-10-08 LAB — SAMPLE TO BLOOD BANK

## 2023-10-08 LAB — PREPARE RBC (CROSSMATCH)

## 2023-10-08 MED ORDER — SODIUM CHLORIDE 0.9% IV SOLUTION
250.0000 mL | INTRAVENOUS | Status: DC
  Administered 2023-10-08: 250 mL via INTRAVENOUS

## 2023-10-08 NOTE — Addendum Note (Signed)
 Addended by: Sadik Piascik S on: 10/08/2023 12:22 PM   Modules accepted: Orders

## 2023-10-08 NOTE — Progress Notes (Signed)
 CRITICAL VALUE STICKER  CRITICAL VALUE:Hgb 6.3 Plts 10 ANC 0.1  RECEIVER (on-site recipient of call):Mckenleigh Tarlton M  DATE & TIME NOTIFIED: 10/08/2023  MESSENGER (representative from lab):Jessica P.  MD NOTIFIED: GBS  TIME OF NOTIFICATION:10/08/23  RESPONSE:  Patient will received 1 unit of Ptls and I RBCS

## 2023-10-09 ENCOUNTER — Telehealth: Payer: Self-pay | Admitting: Oncology

## 2023-10-09 LAB — TYPE AND SCREEN
ABO/RH(D): A NEG
Antibody Screen: NEGATIVE
Unit division: 0

## 2023-10-09 LAB — PREPARE PLATELET PHERESIS: Unit division: 0

## 2023-10-09 LAB — BPAM PLATELET PHERESIS
Blood Product Expiration Date: 202510032359
ISSUE DATE / TIME: 202510021317
Unit Type and Rh: 9500

## 2023-10-09 LAB — BPAM RBC
Blood Product Expiration Date: 202510102359
ISSUE DATE / TIME: 202510021409
Unit Type and Rh: 600

## 2023-10-09 NOTE — Telephone Encounter (Signed)
 New appt day time and location confirmed with PT's daughter.

## 2023-10-10 ENCOUNTER — Emergency Department (HOSPITAL_COMMUNITY)

## 2023-10-10 ENCOUNTER — Inpatient Hospital Stay (HOSPITAL_COMMUNITY)
Admission: EM | Admit: 2023-10-10 | Discharge: 2023-10-12 | DRG: 853 | Disposition: A | Discharge status: Hospice | Attending: Student | Admitting: Student

## 2023-10-10 ENCOUNTER — Other Ambulatory Visit: Payer: Self-pay

## 2023-10-10 ENCOUNTER — Encounter (HOSPITAL_COMMUNITY): Payer: Self-pay | Admitting: Emergency Medicine

## 2023-10-10 DIAGNOSIS — E785 Hyperlipidemia, unspecified: Secondary | ICD-10-CM | POA: Diagnosis present

## 2023-10-10 DIAGNOSIS — Z7189 Other specified counseling: Secondary | ICD-10-CM | POA: Diagnosis not present

## 2023-10-10 DIAGNOSIS — J189 Pneumonia, unspecified organism: Secondary | ICD-10-CM | POA: Diagnosis not present

## 2023-10-10 DIAGNOSIS — D61810 Antineoplastic chemotherapy induced pancytopenia: Secondary | ICD-10-CM | POA: Diagnosis present

## 2023-10-10 DIAGNOSIS — G2581 Restless legs syndrome: Secondary | ICD-10-CM | POA: Diagnosis present

## 2023-10-10 DIAGNOSIS — N1831 Chronic kidney disease, stage 3a: Secondary | ICD-10-CM | POA: Diagnosis present

## 2023-10-10 DIAGNOSIS — Z515 Encounter for palliative care: Secondary | ICD-10-CM | POA: Diagnosis not present

## 2023-10-10 DIAGNOSIS — Q631 Lobulated, fused and horseshoe kidney: Secondary | ICD-10-CM | POA: Diagnosis not present

## 2023-10-10 DIAGNOSIS — Z803 Family history of malignant neoplasm of breast: Secondary | ICD-10-CM

## 2023-10-10 DIAGNOSIS — I129 Hypertensive chronic kidney disease with stage 1 through stage 4 chronic kidney disease, or unspecified chronic kidney disease: Secondary | ICD-10-CM | POA: Diagnosis present

## 2023-10-10 DIAGNOSIS — R918 Other nonspecific abnormal finding of lung field: Secondary | ICD-10-CM | POA: Diagnosis not present

## 2023-10-10 DIAGNOSIS — E1122 Type 2 diabetes mellitus with diabetic chronic kidney disease: Secondary | ICD-10-CM | POA: Diagnosis present

## 2023-10-10 DIAGNOSIS — Z604 Social exclusion and rejection: Secondary | ICD-10-CM | POA: Diagnosis present

## 2023-10-10 DIAGNOSIS — D649 Anemia, unspecified: Secondary | ICD-10-CM

## 2023-10-10 DIAGNOSIS — D849 Immunodeficiency, unspecified: Secondary | ICD-10-CM | POA: Diagnosis present

## 2023-10-10 DIAGNOSIS — D709 Neutropenia, unspecified: Secondary | ICD-10-CM | POA: Diagnosis not present

## 2023-10-10 DIAGNOSIS — E8770 Fluid overload, unspecified: Secondary | ICD-10-CM | POA: Diagnosis present

## 2023-10-10 DIAGNOSIS — Z66 Do not resuscitate: Secondary | ICD-10-CM | POA: Diagnosis present

## 2023-10-10 DIAGNOSIS — A419 Sepsis, unspecified organism: Secondary | ICD-10-CM | POA: Diagnosis not present

## 2023-10-10 DIAGNOSIS — R5081 Fever presenting with conditions classified elsewhere: Secondary | ICD-10-CM | POA: Diagnosis present

## 2023-10-10 DIAGNOSIS — Z9841 Cataract extraction status, right eye: Secondary | ICD-10-CM

## 2023-10-10 DIAGNOSIS — G8929 Other chronic pain: Secondary | ICD-10-CM | POA: Diagnosis present

## 2023-10-10 DIAGNOSIS — Z7901 Long term (current) use of anticoagulants: Secondary | ICD-10-CM

## 2023-10-10 DIAGNOSIS — Z79890 Hormone replacement therapy (postmenopausal): Secondary | ICD-10-CM

## 2023-10-10 DIAGNOSIS — Z8049 Family history of malignant neoplasm of other genital organs: Secondary | ICD-10-CM

## 2023-10-10 DIAGNOSIS — F419 Anxiety disorder, unspecified: Secondary | ICD-10-CM | POA: Diagnosis present

## 2023-10-10 DIAGNOSIS — K219 Gastro-esophageal reflux disease without esophagitis: Secondary | ICD-10-CM | POA: Diagnosis present

## 2023-10-10 DIAGNOSIS — Z8041 Family history of malignant neoplasm of ovary: Secondary | ICD-10-CM

## 2023-10-10 DIAGNOSIS — Z8249 Family history of ischemic heart disease and other diseases of the circulatory system: Secondary | ICD-10-CM | POA: Diagnosis not present

## 2023-10-10 DIAGNOSIS — Z79899 Other long term (current) drug therapy: Secondary | ICD-10-CM

## 2023-10-10 DIAGNOSIS — G4700 Insomnia, unspecified: Secondary | ICD-10-CM | POA: Diagnosis present

## 2023-10-10 DIAGNOSIS — R0989 Other specified symptoms and signs involving the circulatory and respiratory systems: Secondary | ICD-10-CM | POA: Diagnosis not present

## 2023-10-10 DIAGNOSIS — D6189 Other specified aplastic anemias and other bone marrow failure syndromes: Secondary | ICD-10-CM | POA: Diagnosis not present

## 2023-10-10 DIAGNOSIS — Z8551 Personal history of malignant neoplasm of bladder: Secondary | ICD-10-CM

## 2023-10-10 DIAGNOSIS — D696 Thrombocytopenia, unspecified: Secondary | ICD-10-CM | POA: Diagnosis not present

## 2023-10-10 DIAGNOSIS — Z1152 Encounter for screening for COVID-19: Secondary | ICD-10-CM | POA: Diagnosis not present

## 2023-10-10 DIAGNOSIS — C50211 Malignant neoplasm of upper-inner quadrant of right female breast: Secondary | ICD-10-CM

## 2023-10-10 DIAGNOSIS — Z961 Presence of intraocular lens: Secondary | ICD-10-CM | POA: Diagnosis present

## 2023-10-10 DIAGNOSIS — Z85828 Personal history of other malignant neoplasm of skin: Secondary | ICD-10-CM | POA: Diagnosis not present

## 2023-10-10 DIAGNOSIS — J690 Pneumonitis due to inhalation of food and vomit: Secondary | ICD-10-CM | POA: Diagnosis not present

## 2023-10-10 DIAGNOSIS — Z8553 Personal history of malignant neoplasm of renal pelvis: Secondary | ICD-10-CM

## 2023-10-10 DIAGNOSIS — J984 Other disorders of lung: Secondary | ICD-10-CM | POA: Diagnosis not present

## 2023-10-10 DIAGNOSIS — D4621 Refractory anemia with excess of blasts 1: Secondary | ICD-10-CM | POA: Diagnosis not present

## 2023-10-10 DIAGNOSIS — E039 Hypothyroidism, unspecified: Secondary | ICD-10-CM | POA: Diagnosis present

## 2023-10-10 DIAGNOSIS — Z9842 Cataract extraction status, left eye: Secondary | ICD-10-CM

## 2023-10-10 DIAGNOSIS — D469 Myelodysplastic syndrome, unspecified: Secondary | ICD-10-CM | POA: Diagnosis present

## 2023-10-10 DIAGNOSIS — R509 Fever, unspecified: Secondary | ICD-10-CM | POA: Diagnosis not present

## 2023-10-10 DIAGNOSIS — I480 Paroxysmal atrial fibrillation: Secondary | ICD-10-CM | POA: Diagnosis present

## 2023-10-10 DIAGNOSIS — D61818 Other pancytopenia: Secondary | ICD-10-CM

## 2023-10-10 DIAGNOSIS — Z170 Estrogen receptor positive status [ER+]: Secondary | ICD-10-CM

## 2023-10-10 DIAGNOSIS — I517 Cardiomegaly: Secondary | ICD-10-CM | POA: Diagnosis not present

## 2023-10-10 DIAGNOSIS — R0602 Shortness of breath: Secondary | ICD-10-CM | POA: Diagnosis not present

## 2023-10-10 DIAGNOSIS — Z853 Personal history of malignant neoplasm of breast: Secondary | ICD-10-CM

## 2023-10-10 DIAGNOSIS — K589 Irritable bowel syndrome without diarrhea: Secondary | ICD-10-CM | POA: Diagnosis present

## 2023-10-10 DIAGNOSIS — Z806 Family history of leukemia: Secondary | ICD-10-CM

## 2023-10-10 DIAGNOSIS — Z923 Personal history of irradiation: Secondary | ICD-10-CM

## 2023-10-10 DIAGNOSIS — T451X5A Adverse effect of antineoplastic and immunosuppressive drugs, initial encounter: Secondary | ICD-10-CM | POA: Diagnosis present

## 2023-10-10 LAB — PREPARE RBC (CROSSMATCH)

## 2023-10-10 LAB — URINALYSIS, W/ REFLEX TO CULTURE (INFECTION SUSPECTED)
Bacteria, UA: NONE SEEN
Bilirubin Urine: NEGATIVE
Glucose, UA: NEGATIVE mg/dL
Ketones, ur: NEGATIVE mg/dL
Leukocytes,Ua: NEGATIVE
Nitrite: NEGATIVE
Protein, ur: NEGATIVE mg/dL
Specific Gravity, Urine: 1.017 (ref 1.005–1.030)
pH: 5 (ref 5.0–8.0)

## 2023-10-10 LAB — COMPREHENSIVE METABOLIC PANEL WITH GFR
ALT: 15 U/L (ref 0–44)
AST: 27 U/L (ref 15–41)
Albumin: 4 g/dL (ref 3.5–5.0)
Alkaline Phosphatase: 52 U/L (ref 38–126)
Anion gap: 10 (ref 5–15)
BUN: 19 mg/dL (ref 8–23)
CO2: 27 mmol/L (ref 22–32)
Calcium: 9.1 mg/dL (ref 8.9–10.3)
Chloride: 96 mmol/L — ABNORMAL LOW (ref 98–111)
Creatinine, Ser: 1.06 mg/dL — ABNORMAL HIGH (ref 0.44–1.00)
GFR, Estimated: 53 mL/min — ABNORMAL LOW (ref >60)
Glucose, Bld: 125 mg/dL — ABNORMAL HIGH (ref 70–99)
Potassium: 4.4 mmol/L (ref 3.5–5.1)
Sodium: 132 mmol/L — ABNORMAL LOW (ref 135–145)
Total Bilirubin: 1.3 mg/dL — ABNORMAL HIGH (ref 0.0–1.2)
Total Protein: 7.1 g/dL (ref 6.5–8.1)

## 2023-10-10 LAB — CBC WITH DIFFERENTIAL/PLATELET
HCT: 24.7 % — ABNORMAL LOW (ref 36.0–46.0)
Hemoglobin: 7.7 g/dL — ABNORMAL LOW (ref 12.0–15.0)
MCH: 28.2 pg (ref 26.0–34.0)
MCHC: 31.2 g/dL (ref 30.0–36.0)
MCV: 90.5 fL (ref 80.0–100.0)
Platelets: 13 K/uL — CL (ref 150–400)
RBC: 2.73 MIL/uL — ABNORMAL LOW (ref 3.87–5.11)
RDW: 19.7 % — ABNORMAL HIGH (ref 11.5–15.5)
Smear Review: NORMAL
WBC: 0.7 K/uL — CL (ref 4.0–10.5)
nRBC: 0 % (ref 0.0–0.2)
nRBC: 0 /100{WBCs}

## 2023-10-10 LAB — PROTIME-INR
INR: 1.3 — ABNORMAL HIGH (ref 0.8–1.2)
Prothrombin Time: 16.7 s — ABNORMAL HIGH (ref 11.4–15.2)

## 2023-10-10 LAB — TSH: TSH: 1.29 u[IU]/mL (ref 0.350–4.500)

## 2023-10-10 LAB — I-STAT CG4 LACTIC ACID, ED: Lactic Acid, Venous: 1.2 mmol/L (ref 0.5–1.9)

## 2023-10-10 LAB — RESP PANEL BY RT-PCR (RSV, FLU A&B, COVID)  RVPGX2
Influenza A by PCR: NEGATIVE
Influenza B by PCR: NEGATIVE
Resp Syncytial Virus by PCR: NEGATIVE
SARS Coronavirus 2 by RT PCR: NEGATIVE

## 2023-10-10 MED ORDER — GABAPENTIN 100 MG PO CAPS
200.0000 mg | ORAL_CAPSULE | Freq: Every day | ORAL | Status: DC
Start: 2023-10-11 — End: 2023-10-11
  Administered 2023-10-11: 200 mg via ORAL
  Filled 2023-10-10: qty 2

## 2023-10-10 MED ORDER — LORAZEPAM 0.5 MG PO TABS: 0.5000 mg | ORAL_TABLET | Freq: Every evening | ORAL | Status: DC | PRN

## 2023-10-10 MED ORDER — METRONIDAZOLE 500 MG/100ML IV SOLN: 500.0000 mg | Freq: Two times a day (BID) | INTRAVENOUS | Status: DC

## 2023-10-10 MED ORDER — LACTATED RINGERS IV BOLUS (SEPSIS)
1000.0000 mL | Freq: Once | INTRAVENOUS | Status: AC
  Administered 2023-10-10: 1000 mL via INTRAVENOUS

## 2023-10-10 MED ORDER — SODIUM CHLORIDE 0.9 % IV SOLN
100.0000 mg | Freq: Two times a day (BID) | INTRAVENOUS | Status: DC
  Administered 2023-10-11 (×2): 100 mg via INTRAVENOUS
  Filled 2023-10-10 (×3): qty 100

## 2023-10-10 MED ORDER — LORAZEPAM 0.5 MG PO TABS: 0.5000 mg | ORAL_TABLET | Freq: Every day | ORAL | Status: DC

## 2023-10-10 MED ORDER — FLUCONAZOLE 100 MG PO TABS
200.0000 mg | ORAL_TABLET | Freq: Every day | ORAL | Status: DC
  Administered 2023-10-11: 200 mg via ORAL
  Filled 2023-10-10: qty 2

## 2023-10-10 MED ORDER — LACTATED RINGERS IV SOLN: INTRAVENOUS | Status: DC

## 2023-10-10 MED ORDER — LACTATED RINGERS IV BOLUS (SEPSIS)
500.0000 mL | Freq: Once | INTRAVENOUS | Status: AC
  Administered 2023-10-10: 500 mL via INTRAVENOUS

## 2023-10-10 MED ORDER — PROCHLORPERAZINE EDISYLATE 10 MG/2ML IJ SOLN
5.0000 mg | INTRAMUSCULAR | Status: AC
  Administered 2023-10-10: 5 mg via INTRAVENOUS
  Filled 2023-10-10: qty 2

## 2023-10-10 MED ORDER — PANTOPRAZOLE SODIUM 40 MG PO TBEC
40.0000 mg | DELAYED_RELEASE_TABLET | Freq: Two times a day (BID) | ORAL | Status: DC
  Administered 2023-10-10 – 2023-10-11 (×2): 40 mg via ORAL
  Filled 2023-10-10 (×2): qty 1

## 2023-10-10 MED ORDER — FERROUS SULFATE 325 (65 FE) MG PO TABS
325.0000 mg | ORAL_TABLET | Freq: Every day | ORAL | Status: DC
  Administered 2023-10-11: 325 mg via ORAL
  Filled 2023-10-10: qty 1

## 2023-10-10 MED ORDER — IPRATROPIUM-ALBUTEROL 0.5-2.5 (3) MG/3ML IN SOLN
3.0000 mL | RESPIRATORY_TRACT | Status: DC | PRN
  Administered 2023-10-10: 3 mL via RESPIRATORY_TRACT
  Filled 2023-10-10: qty 3

## 2023-10-10 MED ORDER — SODIUM CHLORIDE 0.9% IV SOLUTION
Freq: Once | INTRAVENOUS | Status: AC
Start: 2023-10-10 — End: 2023-10-11

## 2023-10-10 MED ORDER — TRAZODONE HCL 50 MG PO TABS
50.0000 mg | ORAL_TABLET | Freq: Every day | ORAL | Status: DC
  Administered 2023-10-11 (×2): 50 mg via ORAL
  Filled 2023-10-10 (×2): qty 1

## 2023-10-10 MED ORDER — VANCOMYCIN HCL 500 MG/100ML IV SOLN
500.0000 mg | Freq: Once | INTRAVENOUS | Status: AC
  Administered 2023-10-10: 500 mg via INTRAVENOUS
  Filled 2023-10-10: qty 100

## 2023-10-10 MED ORDER — ONDANSETRON HCL 4 MG/2ML IJ SOLN: 4.0000 mg | Freq: Once | INTRAMUSCULAR | Status: DC

## 2023-10-10 MED ORDER — SODIUM CHLORIDE 0.9 % IV SOLN
2.0000 g | Freq: Once | INTRAVENOUS | Status: AC
  Administered 2023-10-10: 2 g via INTRAVENOUS
  Filled 2023-10-10: qty 12.5

## 2023-10-10 MED ORDER — AMIODARONE HCL 200 MG PO TABS
200.0000 mg | ORAL_TABLET | Freq: Every day | ORAL | Status: DC
  Administered 2023-10-11: 200 mg via ORAL
  Filled 2023-10-10: qty 1

## 2023-10-10 MED ORDER — ALPRAZOLAM 0.5 MG PO TABS
1.0000 mg | ORAL_TABLET | Freq: Every day | ORAL | Status: DC
Start: 2023-10-11 — End: 2023-10-11
  Administered 2023-10-11: 1 mg via ORAL
  Filled 2023-10-10: qty 2

## 2023-10-10 MED ORDER — METRONIDAZOLE 500 MG/100ML IV SOLN
500.0000 mg | Freq: Two times a day (BID) | INTRAVENOUS | Status: DC
  Administered 2023-10-11: 500 mg via INTRAVENOUS
  Filled 2023-10-10: qty 100

## 2023-10-10 MED ORDER — LEVOTHYROXINE SODIUM 112 MCG PO TABS
112.0000 ug | ORAL_TABLET | Freq: Every day | ORAL | Status: DC
Start: 2023-10-11 — End: 2023-10-12
  Administered 2023-10-11: 112 ug via ORAL
  Filled 2023-10-10: qty 1

## 2023-10-10 MED ORDER — ACYCLOVIR 400 MG PO TABS
400.0000 mg | ORAL_TABLET | Freq: Two times a day (BID) | ORAL | Status: DC
  Administered 2023-10-11 (×2): 400 mg via ORAL
  Filled 2023-10-10 (×2): qty 1

## 2023-10-10 MED ORDER — SODIUM CHLORIDE 0.9 % IV SOLN
2.0000 g | Freq: Two times a day (BID) | INTRAVENOUS | Status: DC
  Administered 2023-10-11: 2 g via INTRAVENOUS
  Filled 2023-10-10: qty 12.5

## 2023-10-10 MED ORDER — VANCOMYCIN HCL 1750 MG/350ML IV SOLN: 1750.0000 mg | INTRAVENOUS | Status: DC

## 2023-10-10 MED ORDER — ACETAMINOPHEN 650 MG RE SUPP: 650.0000 mg | Freq: Four times a day (QID) | RECTAL | Status: DC | PRN

## 2023-10-10 MED ORDER — ALPRAZOLAM 0.5 MG PO TABS: 0.5000 mg | ORAL_TABLET | Freq: Every evening | ORAL | Status: DC | PRN

## 2023-10-10 MED ORDER — VANCOMYCIN HCL IN DEXTROSE 1-5 GM/200ML-% IV SOLN
1000.0000 mg | Freq: Once | INTRAVENOUS | Status: AC
  Administered 2023-10-10: 1000 mg via INTRAVENOUS
  Filled 2023-10-10: qty 200

## 2023-10-10 MED ORDER — ACETAMINOPHEN 325 MG PO TABS
650.0000 mg | ORAL_TABLET | Freq: Four times a day (QID) | ORAL | Status: DC | PRN
  Administered 2023-10-11 – 2023-10-12 (×2): 650 mg via ORAL
  Filled 2023-10-10 (×2): qty 2

## 2023-10-10 MED ORDER — VITAMIN D 25 MCG (1000 UNIT) PO TABS
2000.0000 [IU] | ORAL_TABLET | Freq: Every day | ORAL | Status: DC
  Administered 2023-10-11: 2000 [IU] via ORAL
  Filled 2023-10-10: qty 2

## 2023-10-10 MED ORDER — GUAIFENESIN ER 600 MG PO TB12
600.0000 mg | ORAL_TABLET | Freq: Two times a day (BID) | ORAL | Status: DC
  Administered 2023-10-10 – 2023-10-11 (×2): 600 mg via ORAL
  Filled 2023-10-10 (×2): qty 1

## 2023-10-10 MED ORDER — TRAMADOL HCL 50 MG PO TABS: 50.0000 mg | ORAL_TABLET | Freq: Two times a day (BID) | ORAL | Status: DC | PRN

## 2023-10-10 MED ORDER — METRONIDAZOLE 500 MG/100ML IV SOLN
500.0000 mg | Freq: Once | INTRAVENOUS | Status: AC
  Administered 2023-10-10: 500 mg via INTRAVENOUS
  Filled 2023-10-10: qty 100

## 2023-10-10 MED ORDER — ACETAMINOPHEN 325 MG PO TABS
650.0000 mg | ORAL_TABLET | Freq: Once | ORAL | Status: AC
  Administered 2023-10-10: 650 mg via ORAL
  Filled 2023-10-10: qty 2

## 2023-10-10 NOTE — Sepsis Progress Note (Signed)
 Sepsis protocol is being followed by [facility].

## 2023-10-10 NOTE — Progress Notes (Addendum)
 Pharmacy Antibiotic Note  Cynthia [name] is a 78 y.o. female admitted on 10/10/2023 with febrile neutropenia and concern for aspiration PNA. Pertinent PMH of R breast and urothelial cancer. Pharmacy has been consulted for vancomycin  and cefepime  dosing. One time doses given in the ED - vancomycin  1000mg  IV, cefepime  2g IV. -noted AKI with BL <1, Tmax 100.4  Plan: Vancomycin  500mg  IV now, to complete adequate load, followed by 1750mg  IV q48h (eAUC 498, Scr 1.06, Vd 0.72) Cefepime  2g IV q12h Monitor renal function, clinical status, fever curve Follow up cultures and ability to de-escalate Vancomycin  level PRN     Temp (24hrs), Avg:100.1 F (37.8 C), Min:99.7 F (37.6 C), Max:100.4 F (38 C)  Recent Labs  Lab 10/05/23 1427 10/06/23 1157 10/07/23 1315 10/08/23 1111 10/10/23 1638 10/10/23 1645  WBC 1.2* 1.1* 1.2* 1.0* 0.7*  --   CREATININE  --   --  0.97  --  1.06*  --   LATICACIDVEN  --   --   --   --   --  1.2    Estimated Creatinine Clearance: 40.5 mL/min (A) (by C-G formula based on SCr of 1.06 mg/dL (H)).    No Known Allergies  Antimicrobials this admission: Flagyl 10/4>> Vanc 10/4 >> Cefepime  10/4 >> Doxycyline 10/4>>  Dose adjustments this admission: None    Microbiology results: 10/4 MRSA PCR:  10/4 Bcx:  10/4 Resp PCR: neg   Thank you for allowing pharmacy to be a part of this patient's care.  Lala Been 10/10/2023 6:45 PM

## 2023-10-10 NOTE — Sepsis Progress Note (Signed)
 Elink following code sepsis

## 2023-10-10 NOTE — ED Provider Notes (Signed)
 [place] EMERGENCY DEPARTMENT AT [facility] Provider Note   CSN: [id number] Arrival date & time: 10/10/23  1557     Patient presents with: Fever   Cynthia [name] is a 78 y.o. female.   78 year old female presents with fever and chills x 1 day.  Patient has a history of cancer and multiple regions.  She notes that she is been coughing and more short of breath.  Denies any vomiting or diarrhea.  No chest or abdominal discomfort.  No urinary symptoms.  Endorses weakness.  Took Tylenol  with limited relief       Prior to Admission medications   Medication Sig Start Date End Date Taking? Authorizing Provider  acetaminophen  (TYLENOL ) 650 MG CR tablet Take 1,300 mg by mouth at bedtime. Patient taking differently: Take 2,600 mg by mouth 2 (two) times daily.    [provider]  acyclovir  (ZOVIRAX ) 400 MG tablet Take 1 tablet (400 mg total) by mouth 2 (two) times daily. 10/02/23   Debby Olam POUR, NP  ALPRAZolam  (XANAX ) 0.5 MG tablet Take 0.5-1 mg by mouth See admin instructions. Take 2 tablets (1 mg) by mouth scheduled every night at bedtime, may take an additional dose (0.5 mg) in 4 hours if still unable to sleep. 04/04/21   [provider]  amiodarone  (PACERONE ) 200 MG tablet Take 1 tablet (200 mg total) by mouth daily. 06/26/23   Kennyth Chew, MD  carboxymethylcellulose (REFRESH PLUS) 0.5 % SOLN Place 1 drop into both eyes in the morning and at bedtime.    [provider]  Cholecalciferol  (VITAMIN D -3) 25 MCG (1000 UT) CAPS Take 2,000 Units by mouth daily with breakfast. Patient taking differently: Take 2,000 Units by mouth every other day.    [provider]  Choline Fenofibrate  (FENOFIBRIC ACID) 135 MG CPDR Take 135 mg by mouth daily. 01/16/16   [provider]  decitabine -cedazuridine  (INQOVI ) 35-100 MG oral tablet Take 1 tablet by mouth daily. Take for 5 days, hold for 23d, repeat every 28d. Take on an empty stomach, at least 2 hr  before or after food 09/21/23   Cloretta Arley NOVAK, MD  ferrous sulfate  325 (65 FE) MG tablet Take 325 mg by mouth at bedtime.    [provider]  fluconazole  (DIFLUCAN ) 200 MG tablet Take 1 tablet (200 mg total) by mouth daily. 10/02/23   Debby Olam POUR, NP  gabapentin  (NEURONTIN ) 100 MG capsule Take 100 mg by mouth at bedtime. Patient taking differently: Take 100 mg by mouth at bedtime. 200 mg QHS 03/26/22   [provider]  levothyroxine  (SYNTHROID , LEVOTHROID) 112 MCG tablet Take 112 mcg by mouth daily. 03/13/16   [provider]  magnesium  oxide (MAG-OX) 400 MG tablet Take 241 mg by mouth daily. 06/26/23   [provider]  MAGNESIUM  PO Take 250 mg by mouth every evening.    [provider]  omeprazole (PRILOSEC) 40 MG capsule Take 40 mg by mouth daily before breakfast.    [provider]  prochlorperazine  (COMPAZINE ) 5 MG tablet Take 1 tablet (5 mg total) by mouth every 6 (six) hours as needed for nausea or vomiting. 09/18/23   Debby Olam POUR, NP  sulfamethoxazole -trimethoprim  (BACTRIM  DS) 800-160 MG tablet Take 1 tablet by mouth 3 (three) times a week. 10/02/23   Thomas, Lisa K, NP  traMADol -acetaminophen  (ULTRACET ) 37.5-325 MG tablet Take 1 tablet by mouth daily as needed. Patient not taking: Reported on 10/02/2023 09/21/23   Cloretta Arley NOVAK, MD  traZODone  (DESYREL ) 100 MG tablet Take 50 mg by mouth at bedtime.    [provider]    Allergies: Patient has no known allergies.    Review of Systems  All other systems reviewed and are negative.   Updated Vital Signs BP (!) 169/109 (BP Location: Left Arm)   Pulse (!) 136   Temp (!) 100.4 F (38 C) (Oral)   Resp 18   SpO2 93%   Physical Exam Vitals and nursing note reviewed.  Constitutional:      General: She is not in acute distress.    Appearance: Normal appearance. She is well-developed. She is not toxic-appearing.  HENT:     Head: Normocephalic and atraumatic.  Eyes:      General: Lids are normal.     Conjunctiva/sclera: Conjunctivae normal.     Pupils: Pupils are equal, round, and reactive to light.  Neck:     Thyroid : No thyroid  mass.     Trachea: No tracheal deviation.  Cardiovascular:     Rate and Rhythm: Normal rate and regular rhythm.     Heart sounds: Normal heart sounds. No murmur heard.    No gallop.  Pulmonary:     Effort: Pulmonary effort is normal. No respiratory distress.     Breath sounds: Normal breath sounds. No stridor. No decreased breath sounds, wheezing, rhonchi or rales.  Abdominal:     General: There is no distension.     Palpations: Abdomen is soft.     Tenderness: There is no abdominal tenderness. There is no rebound.  Musculoskeletal:        General: No tenderness. Normal range of motion.     Cervical back: Normal range of motion and neck supple.  Skin:    General: Skin is warm and dry.     Findings: No abrasion or rash.  Neurological:     Mental Status: She is alert and oriented to person, place, and time. Mental status is at baseline.     GCS: GCS eye subscore is 4. GCS verbal subscore is 5. GCS motor subscore is 6.     Cranial Nerves: No cranial nerve deficit.     Sensory: No sensory deficit.     Motor: Motor function is intact.  Psychiatric:        Attention and Perception: Attention normal.        Speech: Speech normal.        Behavior: Behavior normal.     (all labs ordered are listed, but only abnormal results are displayed) Labs Reviewed  CULTURE, BLOOD (ROUTINE X 2)  CULTURE, BLOOD (ROUTINE X 2)  RESP PANEL BY RT-PCR (RSV, FLU A&B, COVID)  RVPGX2  COMPREHENSIVE METABOLIC PANEL WITH GFR  CBC WITH DIFFERENTIAL/PLATELET  PROTIME-INR  URINALYSIS, W/ REFLEX TO CULTURE (INFECTION SUSPECTED)  I-STAT CG4 LACTIC ACID, ED  I-STAT CG4 LACTIC ACID, ED  TYPE AND SCREEN    EKG: None  Radiology: No results found.   Procedures   Medications Ordered in the ED  lactated ringers  infusion (has no administration  in time range)  lactated ringers  bolus 1,000 mL (has no administration in time range)    And  lactated ringers  bolus 1,000 mL (has no administration in time range)    And  lactated ringers  bolus 500 mL (has no administration in time range)  ceFEPIme  (MAXIPIME ) 2 g in sodium chloride  0.9 % 100 mL IVPB (has no administration in time range)  metroNIDAZOLE (FLAGYL) IVPB 500 mg (has no administration in  time range)  vancomycin  (VANCOCIN ) IVPB 1000 mg/200 mL premix (has no administration in time range)  acetaminophen  (TYLENOL ) tablet 650 mg (has no administration in time range)                                    Medical Decision Making Amount and/or Complexity of Data Reviewed Labs: ordered. Radiology: ordered.  Risk OTC drugs. Prescription drug management.   Code sepsis started when patient arrived here due to fever and history of cancer.  She is neutropenic with white count of 0.7.  When no records reviewed she has been running around this range.  Was febrile here to 100.4.  Given Tylenol .  Started on IV antibiotics as well as IV fluid bolus.  Chest x-ray consistent with pneumonia.  Lactate is normal.  Patient will require admission and will consult hospitalist team.  Results discussed with family at bedside  CRITICAL CARE Performed by: Curtistine ONEIDA Dawn Total critical care time: 45 minutes Critical care time was exclusive of separately billable procedures and treating other patients. Critical care was necessary to treat or prevent imminent or life-threatening deterioration. Critical care was time spent personally by me on the following activities: development of treatment plan with patient and/or surrogate as well as nursing, discussions with consultants, evaluation of patient's response to treatment, examination of patient, obtaining history from patient or surrogate, ordering and performing treatments and interventions, ordering and review of laboratory studies, ordering and review of  radiographic studies, pulse oximetry and re-evaluation of patient's condition.      Final diagnoses:  None    ED Discharge Orders     None          Dawn Curtistine, MD 10/10/23 1731

## 2023-10-10 NOTE — H&P (Signed)
 History and Physical    Patient: Cynthia [name] FMW:[id number] DOB: [birth year] DOA: 10/10/2023 DOS: the patient was seen and examined on 10/10/2023 PCP: [name], FNP  Patient coming from: Home.  Son lives with her.  Ambulatory is holding to staff in the house.  Uses wheelchair outside  Chief Complaint:  Chief Complaint  Patient presents with   Fever   HPI: Cynthia [name] is a 78 y.o. 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  History provided by patient and patient's daughter who is an Char[employer] fundraiser.  Patient with diagnosis of MDS-IB1 and started Inqovi  on 9/15.  Since then, she has had worsening leukopenia/neutropenia, thrombocytopenia and anemia requiring intermittent transfusions.  She also had a port placed by IR on 10/07/2023.  It seems she had a fever 100.9 on 9/29.  She has been prophylactic Bactrim , Diflucan  and acyclovir .  Daughter brought her to ED today due to fever 200.4, rigors and increased weakness.  She also had some shortness of breath and cough.  She reports nausea and an episode of emesis in ED. emesis was small-volume without blood.  She denies runny nose, sore throat, abdominal pain, diarrhea or new bladder habit change.  She reports frequent urination that she attributes to bladder cancer.  She has chronic lower extremity edema.   Patient denies smoking cigarettes.  Drinks alcohol once in a blue moon.  Denies recreational drug use.  She is not interested in CPR and intubation in case of sudden cardiopulmonary arrest.  She states I lived 80 years, and I do not want to go through that.  She is interested in hospice.   In ED, febrile to 100.4.  Tachycardic to 136 but improved to 114.  RR 18 > 25.  BP slightly elevated.   Briefly desaturated to 80s and started on 4 L.  Currently saturating in upper 90s on 2 L by [place].  WBC 0.7.  Differential too few to count.  Hgb 7.7.  Platelets 13.  Lactic acid 1.2.  RVP negative.  UA negative.  CXR concerning for RML and RLL infiltrate although read as vascular congestion and cardiomegaly.  EKG read as A-fib but seems to be sinus rhythm.  EKG is poor quality.  Patient received 2.5 L LR, IV cefepime , Flagyl and vancomycin .  Admission requested for neutropenic fever in the setting of pneumonia.   Review of Systems: As mentioned in the history of present illness. All other systems reviewed and are negative. Past Medical History:  Diagnosis Date   Anemia    Anticoagulant long-term use    eliquis --- managed by cardiology   Anxiety    Arthritis    Bladder cancer (HCC)    Bradycardia    Breast cancer (HCC)    left, s/p lumpectomy, Dr Layla   Cancer of kidney [facility])    left   DDD (degenerative disc disease), lumbar    Depression    Dyspnea    ON EXERTION   First degree heart block    Genetic testing 06/25/2016   Ms. Silvio underwent genetic counseling and testing for hereditary cancer syndromes on 06/17/2016. Her results were negative for mutations in all 46 genes analyzed by [facility]'s 46-gene Common Hereditary Cancers Panel. Genes analyzed include: APC, ATM, AXIN2, BARD1, BMPR1A,  BRCA1, BRCA2, BRIP1, CDH1, CDKN2A, CHEK2, CTNNA1, DICER1, EPCAM, GREM1, HOXB13, KIT, MEN1, MLH1, MSH2, MSH3, MSH6, MUTYH, NBN,   GERD (gastroesophageal reflux disease)    occasional,  takes pepcid    Hematuria    History of radiation therapy 05/22/2016 to 06/20/2016   right breast cancer   Hyperlipidemia    Hypertension    followd by pcp---  (02-22-2019 per pt never had stress test)   Hypothyroidism    followed by pcp   IBS (irritable bowel syndrome)    Incomplete right bundle branch block    Insomnia    Malignant neoplasm of upper-inner quadrant of right breast in female, estrogen receptor  positive [facility]) oncologist--- dr layla   dx 03/ 2018,  Stage IA, IDC, ER/PR +;  04-01-2016 s/p  right breast lumpectomy w/ node dissection's;  completed radiation 06-20-2016   MVP (mitral valve prolapse)    per echo 12-11-2018 in epic, mild    Neuromuscular disorder (HCC)    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   NSVT (nonsustained ventricular tachycardia) (HCC) followed by cardiology   12-10-2018  hospital admission ,  refer to discharge note 12-14-2018 for treatement   PAF (paroxysmal atrial fibrillation) [facility]) primary cardiologist--- dr loni   newly dx 12-10-2018  admission in epic, Afib w/ RVR and NSVT;   in epic TTE 12-11-2018 showed ef 50-55%, mild MVP,  mild AV sclerosis without stenosis;   Cardiac MRI 12-13-2018 in epic   Pneumonia    Pre-diabetes    Prediabetes    Renal mass, left    pelvis    Restless leg syndrome    occasional   RLS (restless legs syndrome)    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   right buccal cheek tx mohs 07/30/2023   Past Surgical History:  Procedure Laterality Date   BREAST LUMPECTOMY WITH RADIOACTIVE SEED AND SENTINEL LYMPH NODE BIOPSY Right 04/01/2016   Procedure: RADIOACTIVE SEED GUIDED RIGHT BREAST LUMPECTOMY WITH RIGHT AXILLARY SENTINEL LYMPH NODE BIOPSY.;  Surgeon: Elon Pacini, MD;  Location: [facility] OR;  Service: General;  Laterality: Right;   CARDIAC CATHETERIZATION  12/11/2018   CATARACT EXTRACTION W/ INTRAOCULAR LENS  IMPLANT, BILATERAL  1980s   COLONOSCOPY     CYSTOSCOPY W/ RETROGRADES Right 06/05/2021   Procedure: CYSTOSCOPY WITH RETROGRADE PYELOGRAM;  Surgeon: Alvaro Hummer, MD;  Location: [facility];  Service: Urology;  Laterality: Right;   CYSTOSCOPY W/ RETROGRADES Right 07/08/2023   Procedure: CYSTOSCOPY, WITH RETROGRADE PYELOGRAM;  Surgeon: Alvaro Hummer KATHEE Mickey., MD;  Location: [facility];  Service: Urology;  Laterality: Right;   CYSTOSCOPY/RETROGRADE/URETEROSCOPY N/A 03/02/2019   Procedure: CYSTOSCOPY/LEFT RETROGRADE/URETEROSCOPY/   BIOPSY/  STENT;  Surgeon: Devere Lonni Righter, MD;  Location: [facility];  Service: Urology;  Laterality: N/A;   INCISIONAL HERNIA REPAIR N/A 03/29/2020   Procedure: REPAIR OF INCISIONAL HERNIA WITH MESH;  Surgeon: Vanderbilt Ned, MD;  Location: [facility] OR;  Service: General;  Laterality: N/A;   IR IMAGING GUIDED PORT INSERTION  07/18/2021   IR IMAGING GUIDED PORT INSERTION  10/07/2023   IR REMOVAL TUN ACCESS W/ PORT W/O FL MOD SED  11/26/2022   LAPAROSCOPIC CHOLECYSTECTOMY  1990s   LOOP RECORDER INSERTION     Left upper chest, now at EOL, will replace when Ablation is done   ROBOT ASSITED LAPAROSCOPIC NEPHROURETERECTOMY Left 04/27/2019   Procedure: XI ROBOT ASSITED LAPAROSCOPIC NEPHROURETERECTOMY;  Surgeon: Alvaro Hummer, MD;  Location: [facility];  Service: Urology;  Laterality: Left;  3.5 HRS   TONSILLECTOMY  age 61   TRANSURETHRAL RESECTION OF BLADDER TUMOR N/A 06/05/2021   Procedure: TRANSURETHRAL RESECTION OF BLADDER TUMOR (TURBT);  Surgeon: Alvaro Hummer, MD;  Location: [facility];  Service: Urology;  Laterality: N/A;   TRANSURETHRAL RESECTION OF BLADDER TUMOR Right 07/08/2023   Procedure: TURBT (TRANSURETHRAL RESECTION OF BLADDER TUMOR);  Surgeon: Alvaro Hummer KATHEE Mickey., MD;  Location: [facility];  Service: Urology;  Laterality: Right;   Social History:  reports that she has never smoked. She has been exposed to tobacco smoke. She has never used smokeless tobacco. She reports that she does not currently use alcohol. She reports that she does not use drugs.  No Known Allergies  Family History  Problem Relation Age of Onset   Congestive Heart Failure Mother    Breast cancer Sister 89       d.54   Cervical cancer Sister 48   Leukemia Brother 52   Leukemia Son    Ovarian cancer Maternal Aunt 24       d.92s   Breast cancer Other 45   Restless legs syndrome Neg Hx    Sleep apnea Neg Hx     Prior to Admission medications   Medication Sig Start Date End Date Taking?  Authorizing Provider  acetaminophen  (TYLENOL ) 650 MG CR tablet Take 1,300 mg by mouth at bedtime. Patient taking differently: Take 2,600 mg by mouth 2 (two) times daily.    [provider]  acyclovir  (ZOVIRAX ) 400 MG tablet Take 1 tablet (400 mg total) by mouth 2 (two) times daily. 10/02/23   Debby Olam POUR, NP  ALPRAZolam  (XANAX ) 0.5 MG tablet Take 0.5-1 mg by mouth See admin instructions. Take 2 tablets (1 mg) by mouth scheduled every night at bedtime, may take an additional dose (0.5 mg) in 4 hours if still unable to sleep. 04/04/21   [provider]  amiodarone  (PACERONE ) 200 MG tablet Take 1 tablet (200 mg total) by mouth daily. 06/26/23   Kennyth Chew, MD  carboxymethylcellulose (REFRESH PLUS) 0.5 % SOLN Place 1 drop into both eyes in the morning and at bedtime.    [provider]  Cholecalciferol  (VITAMIN D -3) 25 MCG (1000 UT) CAPS Take 2,000 Units by mouth daily with breakfast. Patient taking differently: Take 2,000 Units by mouth every other day.    [provider]  Choline Fenofibrate  (FENOFIBRIC ACID) 135 MG CPDR Take 135 mg by mouth daily. 01/16/16   [provider]  decitabine -cedazuridine  (INQOVI ) 35-100 MG oral tablet Take 1 tablet by mouth daily. Take for 5 days, hold for 23d, repeat every 28d. Take on an empty stomach, at least 2 hr before or after food 09/21/23   Cloretta Arley KATHEE, MD  ferrous sulfate  325 (65 FE) MG tablet Take 325 mg by mouth at bedtime.    [provider]  fluconazole  (DIFLUCAN ) 200 MG tablet Take 1 tablet (200 mg total) by mouth daily. 10/02/23   Debby Olam POUR, NP  gabapentin  (NEURONTIN ) 100 MG capsule Take 100 mg by mouth at bedtime. Patient taking differently: Take 100 mg by mouth at bedtime. 200 mg QHS 03/26/22   [provider]  levothyroxine  (SYNTHROID , LEVOTHROID) 112 MCG tablet Take 112 mcg by mouth daily. 03/13/16   [provider]  magnesium  oxide (MAG-OX) 400 MG tablet Take 241 mg by mouth  daily. 06/26/23   [provider]  MAGNESIUM  PO Take 250 mg by mouth every evening.    [provider]  omeprazole (PRILOSEC) 40 MG capsule Take 40 mg  by mouth daily before breakfast.    [provider]  prochlorperazine  (COMPAZINE ) 5 MG tablet Take 1 tablet (5 mg total) by mouth every 6 (six) hours as needed for nausea or vomiting. 09/18/23   Debby Olam POUR, NP  sulfamethoxazole -trimethoprim  (BACTRIM  DS) 800-160 MG tablet Take 1 tablet by mouth 3 (three) times a week. 10/02/23   Thomas, Lisa K, NP  traMADol -acetaminophen  (ULTRACET ) 37.5-325 MG tablet Take 1 tablet by mouth daily as needed. Patient not taking: Reported on 10/02/2023 09/21/23   Cloretta Arley NOVAK, MD  traZODone  (DESYREL ) 100 MG tablet Take 50 mg by mouth at bedtime.    [provider]    Physical Exam: Vitals:   10/10/23 1607 10/10/23 1750  BP: (!) 169/109 (!) 148/92  Pulse: (!) 136 (!) 114  Resp: 18 (!) 25  Temp: (!) 100.4 F (38 C) 99.7 F (37.6 C)  TempSrc: Oral Oral  SpO2: 93% 99%   GENERAL: No apparent distress.  Nontoxic. HEENT: MMM.  Vision and hearing grossly intact.  NECK: Supple.  No apparent JVD.  RESP:  No IWOB.  Fair aeration bilaterally.  Bilateral crackles. CVS: Cardiac to 110s.  Heart sounds normal.  ABD/GI/GU: BS+. Abd soft, NTND.  MSK/EXT:   No apparent deformity. Moves extremities.  Plus BLE edema. SKIN: no apparent skin lesion or wound NEURO: Awake and alert. Oriented appropriately.  No apparent focal neuro deficit. PSYCH: Calm. Normal affect.  Data Reviewed: See HPI  Assessment and Plan: Sepsis/neutropenic fever due to right lung pneumonia: Present on arrival.  Febrile with tachycardia, tachypnea and and leukopenia/neutropenia.  Immunocompromised due to MDS and chemotherapy.  Blood pressure stable.  Lactic acid negative.  CXR concerning for RML/RLL infiltrate.  She reports severe reflux concerning for aspiration. -Continue broad-spectrum antibiotics with  vancomycin , cefepime  and Flagyl -Doubt atypical infection based on distribution of infiltrate on chest x-ray. -Increase PPI to twice daily -Follow-up blood cultures. -SLP eval   History of MDS-IB1-recently started on Inqovi .  Followed by Dr. Cloretta Pancytopenia-likely due to chemotherapy.  Has been receiving frequent blood transfusion/platelet infusion - Discussed with on-call oncologist, Dr. Onesimo. - Transfuse 1 unit of blood - Continue home acyclovir , and Diflucan .  History of A-fib/NSVT: Currently in sinus rhythm.  Not on anticoagulation.  - Continue home amiodarone   Hypothyroidism - Check TSH - Continue home Synthroid   Chronic pain/anxiety/restless leg syndrome -Resume home medications.   -Will schedule for sleep and RLS medication at midnight per patient's request  GERD: Reports severe reflux. - Increase PPI to twice daily  Generalized weakness/physical deconditioning: Walks by holding to furniture in house.  History outside.  - PT/OT eval    Advance Care Planning:   Code Status: Limited: Do not attempt resuscitation (DNR) -DNR-LIMITED -Do Not Intubate/DNI  -discussed with patient and patient's daughters at bedside.  Patient is interested in hospice.  Planning to move to [facility] to live with daughter.  Palliative consulted  Consults: Palliative medicine, oncology  Family Communication: Updated patient's daughters at bedside  Severity of Illness: The appropriate patient status for this patient is INPATIENT. Inpatient status is judged to be reasonable and necessary in order to provide the required intensity of service to ensure the patient's safety. The patient's presenting symptoms, physical exam findings, and initial radiographic and laboratory data in the context of their chronic comorbidities is felt to place them at high risk for further clinical deterioration. Furthermore, it is not anticipated that the patient will be medically stable for discharge from the hospital  within  2 midnights of admission.   * I certify that at the point of admission it is my clinical judgment that the patient will require inpatient hospital care spanning beyond 2 midnights from the point of admission due to high intensity of service, high risk for further deterioration and high frequency of surveillance required.*  Author: Mignon ONEIDA Bump, MD 10/10/2023 7:00 PM  For on call review [email or web address].

## 2023-10-10 NOTE — ED Triage Notes (Signed)
 Pt presents with family. 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  Has had to have multiple transfusions of blood and platelets.  Pt had port placed Wednesday.  Had fever this morning.  Pt shaking with chills.  Weak and pale in triage.

## 2023-10-11 ENCOUNTER — Inpatient Hospital Stay (HOSPITAL_COMMUNITY)

## 2023-10-11 ENCOUNTER — Other Ambulatory Visit (HOSPITAL_COMMUNITY): Payer: Self-pay

## 2023-10-11 DIAGNOSIS — Z515 Encounter for palliative care: Secondary | ICD-10-CM

## 2023-10-11 DIAGNOSIS — Z7189 Other specified counseling: Secondary | ICD-10-CM

## 2023-10-11 DIAGNOSIS — D709 Neutropenia, unspecified: Secondary | ICD-10-CM

## 2023-10-11 DIAGNOSIS — D649 Anemia, unspecified: Secondary | ICD-10-CM

## 2023-10-11 DIAGNOSIS — D6189 Other specified aplastic anemias and other bone marrow failure syndromes: Secondary | ICD-10-CM | POA: Diagnosis not present

## 2023-10-11 DIAGNOSIS — R5081 Fever presenting with conditions classified elsewhere: Secondary | ICD-10-CM | POA: Diagnosis not present

## 2023-10-11 DIAGNOSIS — D4621 Refractory anemia with excess of blasts 1: Secondary | ICD-10-CM | POA: Diagnosis not present

## 2023-10-11 DIAGNOSIS — D696 Thrombocytopenia, unspecified: Secondary | ICD-10-CM | POA: Diagnosis not present

## 2023-10-11 DIAGNOSIS — A419 Sepsis, unspecified organism: Secondary | ICD-10-CM

## 2023-10-11 DIAGNOSIS — J189 Pneumonia, unspecified organism: Secondary | ICD-10-CM

## 2023-10-11 LAB — CBC
HCT: 23.6 % — ABNORMAL LOW (ref 36.0–46.0)
Hemoglobin: 7.5 g/dL — ABNORMAL LOW (ref 12.0–15.0)
MCH: 28.8 pg (ref 26.0–34.0)
MCHC: 31.8 g/dL (ref 30.0–36.0)
MCV: 90.8 fL (ref 80.0–100.0)
Platelets: 9 K/uL — CL (ref 150–400)
RBC: 2.6 MIL/uL — ABNORMAL LOW (ref 3.87–5.11)
RDW: 18.5 % — ABNORMAL HIGH (ref 11.5–15.5)
WBC: 0.6 K/uL — CL (ref 4.0–10.5)
nRBC: 0 % (ref 0.0–0.2)

## 2023-10-11 LAB — COMPREHENSIVE METABOLIC PANEL WITH GFR
ALT: 13 U/L (ref 0–44)
AST: 26 U/L (ref 15–41)
Albumin: 3.4 g/dL — ABNORMAL LOW (ref 3.5–5.0)
Alkaline Phosphatase: 41 U/L (ref 38–126)
Anion gap: 8 (ref 5–15)
BUN: 16 mg/dL (ref 8–23)
CO2: 25 mmol/L (ref 22–32)
Calcium: 8.3 mg/dL — ABNORMAL LOW (ref 8.9–10.3)
Chloride: 96 mmol/L — ABNORMAL LOW (ref 98–111)
Creatinine, Ser: 0.87 mg/dL (ref 0.44–1.00)
GFR, Estimated: >60 mL/min (ref >60)
Glucose, Bld: 113 mg/dL — ABNORMAL HIGH (ref 70–99)
Potassium: 4.3 mmol/L (ref 3.5–5.1)
Sodium: 129 mmol/L — ABNORMAL LOW (ref 135–145)
Total Bilirubin: 2 mg/dL — ABNORMAL HIGH (ref 0.0–1.2)
Total Protein: 6 g/dL — ABNORMAL LOW (ref 6.5–8.1)

## 2023-10-11 LAB — PROTIME-INR
INR: 1.4 — ABNORMAL HIGH (ref 0.8–1.2)
Prothrombin Time: 17.8 s — ABNORMAL HIGH (ref 11.4–15.2)

## 2023-10-11 LAB — MRSA NEXT GEN BY PCR, NASAL: MRSA by PCR Next Gen: NOT DETECTED

## 2023-10-11 LAB — PRO BRAIN NATRIURETIC PEPTIDE: Pro Brain Natriuretic Peptide: 7025 pg/mL — ABNORMAL HIGH (ref <300.0)

## 2023-10-11 MED ORDER — PROCHLORPERAZINE EDISYLATE 10 MG/2ML IJ SOLN
10.0000 mg | Freq: Four times a day (QID) | INTRAMUSCULAR | Status: DC | PRN
  Administered 2023-10-11: 10 mg via INTRAVENOUS
  Filled 2023-10-11: qty 2

## 2023-10-11 MED ORDER — GABAPENTIN 250 MG/5ML PO SOLN
200.0000 mg | Freq: Three times a day (TID) | ORAL | Status: DC | PRN
  Administered 2023-10-11: 200 mg via ORAL
  Filled 2023-10-11 (×2): qty 4

## 2023-10-11 MED ORDER — FUROSEMIDE 10 MG/ML IJ SOLN
40.0000 mg | Freq: Once | INTRAMUSCULAR | Status: AC
Start: 2023-10-11 — End: 2023-10-11
  Administered 2023-10-11: 40 mg via INTRAVENOUS
  Filled 2023-10-11: qty 4

## 2023-10-11 MED ORDER — SODIUM CHLORIDE 0.9% IV SOLUTION: Freq: Once | INTRAVENOUS | Status: AC

## 2023-10-11 MED ORDER — GABAPENTIN 300 MG PO CAPS
300.0000 mg | ORAL_CAPSULE | Freq: Every day | ORAL | 0 refills | Status: DC
  Filled 2023-10-11: qty 30, 30d supply, fill #0

## 2023-10-11 MED ORDER — TRAMADOL HCL 50 MG PO TABS: 50.0000 mg | ORAL_TABLET | Freq: Three times a day (TID) | ORAL | Status: DC | PRN

## 2023-10-11 MED ORDER — OXYCODONE HCL 5 MG/5ML PO SOLN
5.0000 mg | ORAL | 0 refills | Status: DC | PRN
  Filled 2023-10-11: qty 540, 9d supply, fill #0

## 2023-10-11 MED ORDER — GABAPENTIN 100 MG PO TABS: 100.0000 mg | ORAL_TABLET | Freq: Three times a day (TID) | ORAL | 0 refills | Status: DC | PRN

## 2023-10-11 MED ORDER — LORAZEPAM 2 MG/ML PO CONC
0.5000 mg | ORAL | Status: DC | PRN
  Administered 2023-10-11 – 2023-10-12 (×2): 0.5 mg via ORAL
  Filled 2023-10-11 (×2): qty 0.25

## 2023-10-11 MED ORDER — GLYCOPYRROLATE 0.2 MG/ML IJ SOLN: 0.2000 mg | INTRAMUSCULAR | Status: DC | PRN

## 2023-10-11 MED ORDER — FUROSEMIDE 10 MG/ML IJ SOLN
20.0000 mg | Freq: Once | INTRAMUSCULAR | Status: AC
  Administered 2023-10-11: 20 mg via INTRAVENOUS
  Filled 2023-10-11: qty 2

## 2023-10-11 MED ORDER — SODIUM CHLORIDE 0.9% FLUSH: 10.0000 mL | INTRAVENOUS | Status: DC | PRN

## 2023-10-11 MED ORDER — PANTOPRAZOLE SODIUM 40 MG IV SOLR
40.0000 mg | Freq: Two times a day (BID) | INTRAVENOUS | Status: DC
  Administered 2023-10-11 – 2023-10-12 (×2): 40 mg via INTRAVENOUS
  Filled 2023-10-11 (×2): qty 10

## 2023-10-11 MED ORDER — OXYCODONE HCL 5 MG/5ML PO SOLN: 5.0000 mg | ORAL | 0 refills | Status: DC | PRN

## 2023-10-11 MED ORDER — GUAIFENESIN-DM 100-10 MG/5ML PO SYRP: 5.0000 mL | ORAL_SOLUTION | ORAL | Status: DC | PRN

## 2023-10-11 MED ORDER — ONDANSETRON 4 MG PO TBDP
4.0000 mg | ORAL_TABLET | Freq: Four times a day (QID) | ORAL | 0 refills | Status: DC | PRN
  Filled 2023-10-11: qty 20, 5d supply, fill #0

## 2023-10-11 MED ORDER — ONDANSETRON 4 MG PO TBDP
4.0000 mg | ORAL_TABLET | Freq: Four times a day (QID) | ORAL | 0 refills | Status: DC | PRN
Start: 2023-10-11 — End: 2023-10-12

## 2023-10-11 MED ORDER — HYDROMORPHONE HCL 1 MG/ML IJ SOLN: 0.5000 mg | INTRAMUSCULAR | Status: DC | PRN

## 2023-10-11 MED ORDER — HYDROMORPHONE HCL 1 MG/ML IJ SOLN
0.5000 mg | INTRAMUSCULAR | Status: DC | PRN
  Filled 2023-10-11: qty 1

## 2023-10-11 MED ORDER — ONDANSETRON HCL 4 MG/2ML IJ SOLN: 4.0000 mg | Freq: Four times a day (QID) | INTRAMUSCULAR | Status: DC | PRN

## 2023-10-11 NOTE — Consult Note (Signed)
 Consultation Note Date: 10/11/2023   Patient Name: Cynthia [name]  DOB: Dec 13, 1945  MRN: [id number]  Age / Sex: 78 y.o., female  PCP: [name], FNP Referring Physician: Kathrin Mignon DASEN, MD  Reason for Consultation: Establishing goals of care  HPI/Patient Profile: 78 y.o. female   admitted on 10/10/2023   Cynthia [name] is a 78 y.o. 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   Clinical Assessment and Goals of Care: Patient with diagnosis of MDS-IB1 and started Inqovi  on 9/15.  Since then, she has had worsening leukopenia/neutropenia, thrombocytopenia and anemia requiring intermittent transfusions.  She also had a port placed by IR on 10/07/2023.  Patient brought to the ED for fever 100.4, rigors and increased weakness,some shortness of breath and cough,nausea and an episode of emesis in ED. Admitted to hospital medicine service for sepsis/neutropenic fever due to right lung pneumonia. Patient has expressed wishes for comfort measures and hospice.  Palliative consult for ongoing goals of care discussions.  Palliative medicine is specialized medical care for people living with serious illness. It focuses on providing relief from the symptoms and stress of a serious illness. The goal is to improve quality of life for both the patient and the family. Goals of care: Broad aims of medical therapy in relation to the patient's values and preferences. Our aim is to provide medical care aimed at enabling patients to achieve the goals that matter most to them, given the circumstances of their particular medical situation and their constraints.  Discussed with  daughter outside the room Also discussed with patient and with Dr Kathrin.   HCPOA  Daughter [name] Null  who is an Charity fundraiser, is at bedside.   SUMMARY OF RECOMMENDATIONS    DNR DNI Comfort measures TOC consult for residential hospice evaluation Continue current comfort measures  Code Status/Advance Care Planning: DNR   Symptom Management:     Palliative Prophylaxis:  Frequent Pain Assessment  Additional Recommendations (Limitations, Scope, Preferences): Full Comfort Care  Psycho-social/Spiritual:  Desire for further Chaplaincy support:yes Additional Recommendations: Education on Hospice  Prognosis:  < 2 weeks  Discharge Planning: Hospice facility      Primary Diagnoses: Present on Admission:  Neutropenic fever   I have reviewed the medical record, interviewed the patient and family, and examined the patient. The following aspects are pertinent.  Past Medical History:  Diagnosis Date   Anemia    Anticoagulant long-term use    eliquis --- managed by cardiology   Anxiety    Arthritis    Bladder cancer (HCC)    Bradycardia    Breast cancer (HCC)    left, s/p lumpectomy, Dr Layla   Cancer of kidney [facility])    left   DDD (degenerative disc disease), lumbar    Depression  Dyspnea    ON EXERTION   First degree heart block    Genetic testing 06/25/2016   Ms. Smalling underwent genetic counseling and testing for hereditary cancer syndromes on 06/17/2016. Her results were negative for mutations in all 46 genes analyzed by [facility]'s 46-gene Common Hereditary Cancers Panel. Genes analyzed include: APC, ATM, AXIN2, BARD1, BMPR1A, BRCA1, BRCA2, BRIP1, CDH1, CDKN2A, CHEK2, CTNNA1, DICER1, EPCAM, GREM1, HOXB13, KIT, MEN1, MLH1, MSH2, MSH3, MSH6, MUTYH, NBN,   GERD (gastroesophageal reflux disease)    occasional,  takes pepcid    Hematuria    History of radiation therapy 05/22/2016 to 06/20/2016   right breast cancer   Hyperlipidemia    Hypertension    followd by pcp---   (02-22-2019 per pt never had stress test)   Hypothyroidism    followed by pcp   IBS (irritable bowel syndrome)    Incomplete right bundle branch block    Insomnia    Malignant neoplasm of upper-inner quadrant of right breast in female, estrogen receptor positive [facility]) oncologist--- dr layla   dx 03/ 2018,  Stage IA, IDC, ER/PR +;  04-01-2016 s/p  right breast lumpectomy w/ node dissection's;  completed radiation 06-20-2016   MVP (mitral valve prolapse)    per echo 12-11-2018 in epic, mild    Neuromuscular disorder (HCC)    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   NSVT (nonsustained ventricular tachycardia) (HCC) followed by cardiology   12-10-2018  hospital admission ,  refer to discharge note 12-14-2018 for treatement   PAF (paroxysmal atrial fibrillation) [facility]) primary cardiologist--- dr loni   newly dx 12-10-2018  admission in epic, Afib w/ RVR and NSVT;   in epic TTE 12-11-2018 showed ef 50-55%, mild MVP,  mild AV sclerosis without stenosis;   Cardiac MRI 12-13-2018 in epic   Pneumonia    Pre-diabetes    Prediabetes    Renal mass, left    pelvis    Restless leg syndrome    occasional   RLS (restless legs syndrome)    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   right buccal cheek tx mohs 07/30/2023   Social History   Socioeconomic History   Marital status: Widowed    Spouse name: Not on file   Number of children: Not on file   Years of education: Not on file   Highest education level: Not on file  Occupational History   Not on file  Tobacco Use   Smoking status: Never    Passive exposure: Past   Smokeless tobacco: Never  Vaping Use   Vaping status: Never Used  Substance and Sexual Activity   Alcohol use: Not Currently    Comment: 4 or 5 mixed drinks per year   Drug use: Never   Sexual activity: Not Currently    Birth control/protection: Post-menopausal  Other Topics Concern   Not on file  Social History Narrative   Caffeine: max of 1-1.5 cokes per day    Right  handed   Social Drivers of Health   Financial Resource Strain: Not on file  Food Insecurity: No Food Insecurity (10/10/2023)   Hunger Vital Sign    Worried About Running Out of Food in the Last Year: Never true    Ran Out of Food in the Last Year: Never true  Transportation Needs: No Transportation Needs (10/10/2023)   PRAPARE - Administrator, Civil Service (Medical): No    Lack of Transportation (Non-Medical): No  Physical Activity: Not on file  Stress: Not  on file  Social Connections: Socially Isolated (10/10/2023)   Social Connection and Isolation Panel    Frequency of Communication with Friends and Family: Three times a week    Frequency of Social Gatherings with Friends and Family: More than three times a week    Attends Religious Services: Never    Database administrator or Organizations: No    Attends Banker Meetings: Never    Marital Status: Widowed   Family History  Problem Relation Age of Onset   Congestive Heart Failure Mother    Breast cancer Sister 16       d.54   Cervical cancer Sister 74   Leukemia Brother 30   Leukemia Son    Ovarian cancer Maternal Aunt 33       d.92s   Breast cancer Other 45   Restless legs syndrome Neg Hx    Sleep apnea Neg Hx    Scheduled Meds:  acyclovir   400 mg Oral BID   ALPRAZolam   1 mg Oral QHS   amiodarone   200 mg Oral Daily   cholecalciferol   2,000 Units Oral Q breakfast   ferrous sulfate   325 mg Oral QHS   fluconazole   200 mg Oral Daily   gabapentin   200 mg Oral QHS   guaiFENesin   600 mg Oral BID   levothyroxine   112 mcg Oral Daily   ondansetron   4 mg Intravenous Once   pantoprazole   40 mg Oral BID   traZODone   50 mg Oral QHS   Continuous Infusions:  ceFEPime  (MAXIPIME ) IV 2 g (10/11/23 0408)   doxycycline  (VIBRAMYCIN ) IV 100 mg (10/11/23 1019)   PRN Meds:.acetaminophen  **OR** acetaminophen , ALPRAZolam , ipratropium-albuterol , prochlorperazine , traMADol  Medications Prior to Admission:  Prior  to Admission medications   Medication Sig Start Date End Date Taking? Authorizing Provider  acetaminophen  (TYLENOL ) 650 MG CR tablet Take 1,300 mg by mouth at bedtime. Patient taking differently: Take 2,600 mg by mouth 2 (two) times daily.   Yes [provider]  acyclovir  (ZOVIRAX ) 400 MG tablet Take 1 tablet (400 mg total) by mouth 2 (two) times daily. 10/02/23  Yes Debby Olam POUR, NP  ALPRAZolam  (XANAX ) 0.5 MG tablet Take 0.5-1 mg by mouth See admin instructions. Take 2 tablets (1 mg) by mouth scheduled every night at bedtime, may take an additional dose (0.5 mg) in 4 hours if still unable to sleep. 04/04/21  Yes [provider]  amiodarone  (PACERONE ) 200 MG tablet Take 1 tablet (200 mg total) by mouth daily. 06/26/23  Yes Kennyth Chew, MD  carboxymethylcellulose (REFRESH PLUS) 0.5 % SOLN Place 1 drop into both eyes in the morning and at bedtime.   Yes [provider]  Cholecalciferol  (VITAMIN D -3) 25 MCG (1000 UT) CAPS Take 2,000 Units by mouth daily with breakfast. Patient taking differently: Take 2,000 Units by mouth every other day.   Yes [provider]  Choline Fenofibrate  (FENOFIBRIC ACID) 135 MG CPDR Take 135 mg by mouth daily. 01/16/16  Yes [provider]  decitabine -cedazuridine  (INQOVI ) 35-100 MG oral tablet Take 1 tablet by mouth daily. Take for 5 days, hold for 23d, repeat every 28d. Take on an empty stomach, at least 2 hr before or after food 09/21/23  Yes Cloretta Arley NOVAK, MD  ferrous sulfate  325 (65 FE) MG tablet Take 325 mg by mouth at bedtime.   Yes [provider]  fluconazole  (DIFLUCAN ) 200 MG tablet Take 1 tablet (200 mg total) by mouth daily. 10/02/23  Yes Debby Olam  K, NP  gabapentin  (NEURONTIN ) 100 MG capsule Take 100 mg by mouth at bedtime. Patient taking differently: Take 100 mg by mouth at bedtime. 200 mg QHS 03/26/22  Yes [provider]  levothyroxine  (SYNTHROID , LEVOTHROID) 112 MCG tablet Take 112 mcg by  mouth daily. 03/13/16  Yes [provider]  magnesium  oxide (MAG-OX) 400 MG tablet Take 241 mg by mouth daily. 06/26/23  Yes [provider]  mupirocin ointment (BACTROBAN) 2 % Apply 1 Application topically daily as needed. Apply to affected area 09/30/23  Yes [provider]  omeprazole (PRILOSEC) 40 MG capsule Take 40 mg by mouth daily before breakfast.   Yes [provider]  prochlorperazine  (COMPAZINE ) 5 MG tablet Take 1 tablet (5 mg total) by mouth every 6 (six) hours as needed for nausea or vomiting. 09/18/23  Yes Debby Olam POUR, NP  sulfamethoxazole -trimethoprim  (BACTRIM  DS) 800-160 MG tablet Take 1 tablet by mouth 3 (three) times a week. 10/02/23  Yes Debby Olam POUR, NP  traMADol -acetaminophen  (ULTRACET ) 37.5-325 MG tablet Take 1 tablet by mouth daily as needed. 09/21/23  Yes Cloretta Arley NOVAK, MD  traZODone  (DESYREL ) 100 MG tablet Take 50 mg by mouth at bedtime.   Yes [provider]  MAGNESIUM  PO Take 250 mg by mouth every evening. Patient not taking: Reported on 10/10/2023    [provider]   No Known Allergies Review of Systems +weakness  Physical Exam Elderly lady resting in bed Appears tired and fatigued, but is able to open eyes and verbalizes her needs and wishes Has shallow regular work of breathing Appears with generalized weakness  Vital Signs: BP 128/74   Pulse 95   Temp 98.4 F (36.9 C) (Oral)   Resp 18   Ht 5' 3 (1.6 m)   SpO2 97%   BMI 28.48 kg/m  Pain Scale: Not given for pain   Pain Score: 0-No pain   SpO2: SpO2: 97 % O2 Device:SpO2: 97 % O2 Flow Rate: .O2 Flow Rate (L/min): 3 L/min  IO: Intake/output summary:  Intake/Output Summary (Last 24 hours) at 10/11/2023 1136 Last data filed at 10/11/2023 1032 Gross per 24 hour  Intake 1300.38 ml  Output 725 ml  Net 575.38 ml    LBM: Last BM Date : 10/09/23 Baseline Weight:   Most recent weight:       Palliative Assessment/Data:   PPS 30%  Time In:   10 Time Out:  11.20 Time Total:  80 Greater than 50%  of this time was spent counseling and coordinating care related to the above assessment and plan.  Signed by: Lonia Serve, MD   Please contact Palliative Medicine Team phone at [phone] for questions and concerns.  For individual provider: See Tracey

## 2023-10-11 NOTE — Progress Notes (Signed)
 Cynthia [name] [address] [facility] Liaison Note:   Referral received from [place], [facility] for family interest in [facility].    Met with patient and family in room to explain services and hospice philosophy and all questions answered.  [facility] is able to accept patient this afternoon once consents are complete, however daughter has expressed that she now prefers to take patient home with hospice services.    Spoke with [name] (dtr) to initiate education related to hospice philosophy, services, and team approach to care for hospice services in home setting. Daughter verbalized understanding of information given. Per discussion, the plan is for discharge home by [facility] likely tomorrow due to DME unable to deliver today.   DME needs discussed. Family requests the following equipment for delivery: hospital bed with full rails, over bed table, bedside commode, and oxygen. Informed daughter that gate belt is not provided. The address has been verified and is correct in the chart. [name] is the family contact to arrange time of equipment delivery.   Please send signed and completed DNR home with patient/family. Please provide prescriptions at discharge as needed to ensure ongoing symptom management.    [facility] information and contact numbers given to [place]. Above information shared with team via secure chat. Please call with any questions or concerns.    Thank you for the opportunity to participate in this patient's care.    Nat Babe BSN, RN [employer] [phone]

## 2023-10-11 NOTE — Progress Notes (Signed)
     Patient Name: Cynthia [name]           DOB: 1945/03/16  MRN: [id number]      Admission Date: 10/10/2023  Attending Provider: Kathrin Mignon DASEN, MD  Primary Diagnosis: Neutropenic fever   Level of care: Progressive   OVERNIGHT EVENT   IV fluids turned off, concerns for fluid accumulation.   Patient complains of feeling puffy and swollen.  Breath sounds wheezy with fine crackles to bases. Neb tx helped alleviate wheezing. She received 2.5 L fluid bolus on admission, as well as 1 unit PRBC and maintenance fluid. BNP this morning is 7,025.  Also, platelets 9K.    Plan: Chest x-ray 20 mg IV Lasix  BNP 1 unit platelets   Marlie Kuennen, DNP, ACNPC- AG [facility] [place]

## 2023-10-11 NOTE — TOC Progression Note (Signed)
 Transition of Care [facility]) - Progression Note    Patient Details  Name: Cynthia [name] MRN: [id number] Date of Birth: August 19, 1945  Transition of Care [facility]) CM/SW Contact  Jon ONEIDA Anon, RN Phone Number: 10/11/2023, 11:45 AM  Clinical Narrative:    Pt is from home. ICM consulted for residential hospice. Pt/ pt daughter are agreeable and would like pt to go to [facility]. NCM sent referral to Nat Babe, hospital liaison for [facility]. She accepts referral. ICM will continue to follow to assist in any DC needs.   Expected Discharge Plan: Hospice Medical Facility Barriers to Discharge: Other (must enter comment) (Awaiting bed availability at [facility])               Expected Discharge Plan and Services In-house Referral: Hospice / Palliative Care Discharge Planning Services: CM Consult Post Acute Care Choice: Residential Hospice Bed Living arrangements for the past 2 months: Single Family Home                 DME Arranged: N/A DME Agency: NA       HH Arranged: NA HH Agency: Other - See comment Photographer) Date HH Agency Contacted: 10/11/23 Time HH Agency Contacted: 1000 Representative spoke with at [facility] Agency: Nat Babe   Social Drivers of Health (SDOH) Interventions SDOH Screenings   Food Insecurity: No Food Insecurity (10/10/2023)  Housing: Low Risk  (10/10/2023)  Transportation Needs: No Transportation Needs (10/10/2023)  Utilities: Not At Risk (10/10/2023)  Depression (PHQ2-9): Low Risk  (10/05/2023)  Social Connections: Socially Isolated (10/10/2023)  Tobacco Use: Low Risk  (10/10/2023)    Readmission Risk Interventions    10/11/2023   11:41 AM 09/03/2021   11:36 AM  Readmission Risk Prevention Plan  Transportation Screening Complete   PCP or Specialist Appt within 3-5 Days Complete Complete  HRI or Home Care Consult Complete Complete  Social Work Consult for Recovery Care Planning/Counseling Complete Complete  Palliative Care Screening  Complete Complete  Medication Review Oceanographer) Complete Complete

## 2023-10-11 NOTE — Consult Note (Addendum)
 [facility]   HEMATOLOGY/ONCOLOGY CONSULTATION NOTE  Date of Service: 10/11/2023  Patient Care Team: [name], FNP as PCP - General (Family Medicine) Loni Soyla LABOR, MD as PCP - Cardiology (Cardiology) Kennyth Chew, MD as PCP - Electrophysiology (Cardiology) Gail Favorite, MD as Consulting Physician (General Surgery) Dewey Rush, MD as Consulting Physician (Radiation Oncology) Crawford, Morna Pickle, NP as Nurse Practitioner (Hematology and Oncology) Alvaro Ricardo KATHEE Raddle., MD as Consulting Physician (Urology) Bonner Ade, MD as Consulting Physician (Physical Medicine and Rehabilitation) Vonzell Rosina BIRCH, MD as Attending Physician (Radiology) Cloretta Arley KATHEE, MD as Consulting Physician (Oncology)  CHIEF COMPLAINTS/PURPOSE OF CONSULTATION:  Patient with severe pancytopenia with neutropenic fevers and pneumonia in the context of 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  HISTORY OF PRESENTING ILLNESS:   Cynthia [name] is a wonderful 78 y.o. 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 Patient is followed by Dr. Arvella Cloretta and Olam Ned as outpatient. She has a history of previous breast cancer, bladder cancer, renal pelvis cancer, cutaneous squamous cell carcinomas and multiple other medical issues including anxiety, depression, degenerative disc disease, irritable bowel syndrome, paroxysmal atrial fibrillation, restless leg syndrome etc.  She was last seen in clinic by Olam Ned, NP on 10/01/2023 for her MDS with excess blasts and was started on Inqovi  on 09/21/2023. She has had progressive pancytopenia and has been transfusion dependent receiving frequent transfusion support. Patient was admitted to the hospital on 10/10/2023 with progressive weakness, shortness of breath, cough and concern for sepsis from a right sided pneumonia in the context of  neutropenic fevers. Patient was resuscitated with IV fluids and started on broad-spectrum antibiotics. She is having some issues with fluid overload and some increasing shortness of breath and was having very significant fatigue. She has received transfusion support. Notes that she has been very fatigued and is tired of the involved oncology and hematology cares that she has needed.  She feels that her multiple medical issues are quite limiting at this point and her underlying MDS is progressive. She is surrounded by multiple family members including her daughter who is an Charity fundraiser. Palliative care team has also been involved in defining goals of care. I was consulted to weigh in from hematology oncology standpoint.  I had a detailed goals of care discussion and detailed discussion of the status of her MDS at this time.  We discussed that her cytopenias were getting worse from the underlying MDS and Inqovi  may additionally be causing more cytopenias.  We discussed that she is likely to remain transfusion dependent and infections in the context of neutropenia often are significantly limiting and sometimes terminal events. We discussed that the use of G-CSF in this context might be limited from the possibility of stimulating blast counts and given that the stem cells are not working well may not be effective. The patient and the family are very nice and very clear that they want to go home with best supportive cares through hospice and not continue burdensome transfusion support and other treatments. In talking to the palliative care team she was also accepted to [facility] but the patient and her family prefers to go home. Hospice is involved and are trying to get her DME needs met by Monday so she can go home with home hospice.  MEDICAL HISTORY:  Past Medical History:  Diagnosis Date   Anemia  Anticoagulant long-term use    eliquis --- managed by cardiology   Anxiety    Arthritis    Bladder cancer  (HCC)    Bradycardia    Breast cancer (HCC)    left, s/p lumpectomy, Dr Layla   Cancer of kidney [facility])    left   DDD (degenerative disc disease), lumbar    Depression    Dyspnea    ON EXERTION   First degree heart block    Genetic testing 06/25/2016   Ms. Farrel underwent genetic counseling and testing for hereditary cancer syndromes on 06/17/2016. Her results were negative for mutations in all 46 genes analyzed by [employer]'s 46-gene Common Hereditary Cancers Panel. Genes analyzed include: APC, ATM, AXIN2, BARD1, BMPR1A, BRCA1, BRCA2, BRIP1, CDH1, CDKN2A, CHEK2, CTNNA1, DICER1, EPCAM, GREM1, HOXB13, KIT, MEN1, MLH1, MSH2, MSH3, MSH6, MUTYH, NBN,   GERD (gastroesophageal reflux disease)    occasional,  takes pepcid    Hematuria    History of radiation therapy 05/22/2016 to 06/20/2016   right breast cancer   Hyperlipidemia    Hypertension    followd by pcp---  (02-22-2019 per pt never had stress test)   Hypothyroidism    followed by pcp   IBS (irritable bowel syndrome)    Incomplete right bundle branch block    Insomnia    Malignant neoplasm of upper-inner quadrant of right breast in female, estrogen receptor positive [facility]) oncologist--- dr layla   dx 03/ 2018,  Stage IA, IDC, ER/PR +;  04-01-2016 s/p  right breast lumpectomy w/ node dissection's;  completed radiation 06-20-2016   MVP (mitral valve prolapse)    per echo 12-11-2018 in epic, mild    Neuromuscular disorder (HCC)    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   NSVT (nonsustained ventricular tachycardia) (HCC) followed by cardiology   12-10-2018  hospital admission ,  refer to discharge note 12-14-2018 for treatement   PAF (paroxysmal atrial fibrillation) [facility]) primary cardiologist--- dr loni   newly dx 12-10-2018  admission in epic, Afib w/ RVR and NSVT;   in epic TTE 12-11-2018 showed ef 50-55%, mild MVP,  mild AV sclerosis without stenosis;   Cardiac MRI 12-13-2018 in epic   Pneumonia    Pre-diabetes    Prediabetes     Renal mass, left    pelvis    Restless leg syndrome    occasional   RLS (restless legs syndrome)    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   right buccal cheek tx mohs 07/30/2023    SURGICAL HISTORY: Past Surgical History:  Procedure Laterality Date   BREAST LUMPECTOMY WITH RADIOACTIVE SEED AND SENTINEL LYMPH NODE BIOPSY Right 04/01/2016   Procedure: RADIOACTIVE SEED GUIDED RIGHT BREAST LUMPECTOMY WITH RIGHT AXILLARY SENTINEL LYMPH NODE BIOPSY.;  Surgeon: Elon Pacini, MD;  Location: [facility] OR;  Service: General;  Laterality: Right;   CARDIAC CATHETERIZATION  12/11/2018   CATARACT EXTRACTION W/ INTRAOCULAR LENS  IMPLANT, BILATERAL  1980s   COLONOSCOPY     CYSTOSCOPY W/ RETROGRADES Right 06/05/2021   Procedure: CYSTOSCOPY WITH RETROGRADE PYELOGRAM;  Surgeon: Alvaro Hummer, MD;  Location: [facility]S;  Service: Urology;  Laterality: Right;   CYSTOSCOPY W/ RETROGRADES Right 07/08/2023   Procedure: CYSTOSCOPY, WITH RETROGRADE PYELOGRAM;  Surgeon: Alvaro Hummer KATHEE Mickey., MD;  Location: [facility];  Service: Urology;  Laterality: Right;   CYSTOSCOPY/RETROGRADE/URETEROSCOPY N/A 03/02/2019   Procedure: CYSTOSCOPY/LEFT RETROGRADE/URETEROSCOPY/  BIOPSY/  STENT;  Surgeon: Devere Lonni Righter, MD;  Location: [facility];  Service: Urology;  Laterality: N/A;  INCISIONAL HERNIA REPAIR N/A 03/29/2020   Procedure: REPAIR OF INCISIONAL HERNIA WITH MESH;  Surgeon: Vanderbilt Ned, MD;  Location: [facility] OR;  Service: General;  Laterality: N/A;   IR IMAGING GUIDED PORT INSERTION  07/18/2021   IR IMAGING GUIDED PORT INSERTION  10/07/2023   IR REMOVAL TUN ACCESS W/ PORT W/O FL MOD SED  11/26/2022   LAPAROSCOPIC CHOLECYSTECTOMY  1990s   LOOP RECORDER INSERTION     Left upper chest, now at EOL, will replace when Ablation is done   ROBOT ASSITED LAPAROSCOPIC NEPHROURETERECTOMY Left 04/27/2019   Procedure: XI ROBOT ASSITED LAPAROSCOPIC NEPHROURETERECTOMY;  Surgeon: Alvaro Hummer, MD;  Location: [facility];  Service: Urology;  Laterality: Left;  3.5 HRS   TONSILLECTOMY  age 50   TRANSURETHRAL RESECTION OF BLADDER TUMOR N/A 06/05/2021   Procedure: TRANSURETHRAL RESECTION OF BLADDER TUMOR (TURBT);  Surgeon: Alvaro Hummer, MD;  Location: [facility];  Service: Urology;  Laterality: N/A;   TRANSURETHRAL RESECTION OF BLADDER TUMOR Right 07/08/2023   Procedure: TURBT (TRANSURETHRAL RESECTION OF BLADDER TUMOR);  Surgeon: Alvaro Hummer KATHEE Mickey., MD;  Location: [facility];  Service: Urology;  Laterality: Right;    SOCIAL HISTORY: Social History   Socioeconomic History   Marital status: Widowed    Spouse name: Not on file   Number of children: Not on file   Years of education: Not on file   Highest education level: Not on file  Occupational History   Not on file  Tobacco Use   Smoking status: Never    Passive exposure: Past   Smokeless tobacco: Never  Vaping Use   Vaping status: Never Used  Substance and Sexual Activity   Alcohol use: Not Currently    Comment: 4 or 5 mixed drinks per year   Drug use: Never   Sexual activity: Not Currently    Birth control/protection: Post-menopausal  Other Topics Concern   Not on file  Social History Narrative   Caffeine: max of 1-1.5 cokes per day    Right handed   Social Drivers of Health   Financial Resource Strain: Not on file  Food Insecurity: No Food Insecurity (10/10/2023)   Hunger Vital Sign    Worried About Running Out of Food in the Last Year: Never true    Ran Out of Food in the Last Year: Never true  Transportation Needs: No Transportation Needs (10/10/2023)   PRAPARE - Administrator, Civil Service (Medical): No    Lack of Transportation (Non-Medical): No  Physical Activity: Not on file  Stress: Not on file  Social Connections: Socially Isolated (10/10/2023)   Social Connection and Isolation Panel    Frequency of Communication with Friends and Family: Three times a week    Frequency of Social Gatherings with Friends and Family:  More than three times a week    Attends Religious Services: Never    Database administrator or Organizations: No    Attends Banker Meetings: Never    Marital Status: Widowed  Intimate Partner Violence: Not At Risk (10/10/2023)   Humiliation, Afraid, Rape, and Kick questionnaire    Fear of Current or Ex-Partner: No    Emotionally Abused: No    Physically Abused: No    Sexually Abused: No    FAMILY HISTORY: Family History  Problem Relation Age of Onset   Congestive Heart Failure Mother    Breast cancer Sister 78       d.54   Cervical cancer Sister 33  Leukemia Brother 54   Leukemia Son    Ovarian cancer Maternal Aunt 5       d.92s   Breast cancer Other 45   Restless legs syndrome Neg Hx    Sleep apnea Neg Hx     ALLERGIES:  has no known allergies.  MEDICATIONS:  Current Facility-Administered Medications  Medication Dose Route Frequency Provider Last Rate Last Admin   acetaminophen  (TYLENOL ) tablet 650 mg  650 mg Oral Q6H PRN Gonfa, Taye T, MD   650 mg at 10/11/23 0439   Or   acetaminophen  (TYLENOL ) suppository 650 mg  650 mg Rectal Q6H PRN Gonfa, Taye T, MD       gabapentin  (NEURONTIN ) 250 MG/5ML solution 200 mg  200 mg Oral TID PRN Gonfa, Taye T, MD       glycopyrrolate (ROBINUL) injection 0.2 mg  0.2 mg Intravenous Q4H PRN Gonfa, Taye T, MD       guaiFENesin -dextromethorphan (ROBITUSSIN DM) 100-10 MG/5ML syrup 5 mL  5 mL Oral Q4H PRN Gonfa, Taye T, MD       HYDROmorphone  (DILAUDID ) injection 0.5-1 mg  0.5-1 mg Intravenous Q2H PRN Gonfa, Taye T, MD       ipratropium-albuterol  (DUONEB) 0.5-2.5 (3) MG/3ML nebulizer solution 3 mL  3 mL Nebulization Q4H PRN Chavez, Abigail, NP   3 mL at 10/10/23 2359   levothyroxine  (SYNTHROID ) tablet 112 mcg  112 mcg Oral Daily Gonfa, Taye T, MD   112 mcg at 10/11/23 0629   LORazepam (ATIVAN) 2 MG/ML concentrated solution 0.5 mg  0.5 mg Oral Q4H PRN Gonfa, Taye T, MD       pantoprazole  (PROTONIX ) injection 40 mg  40 mg  Intravenous Q12H Gonfa, Taye T, MD       prochlorperazine  (COMPAZINE ) injection 10 mg  10 mg Intravenous Q6H PRN Gonfa, Taye T, MD   10 mg at 10/11/23 1010   traMADol  (ULTRAM ) tablet 50 mg  50 mg Oral Q8H PRN Gonfa, Taye T, MD       traZODone  (DESYREL ) tablet 50 mg  50 mg Oral QHS Gonfa, Taye T, MD   50 mg at 10/11/23 0020    REVIEW OF SYSTEMS:    10 Point review of Systems was done is negative except as noted above.  PHYSICAL EXAMINATION: ECOG PERFORMANCE STATUS: 4 - Bedbound  . Vitals:   10/11/23 0744 10/11/23 1032  BP: 102/63 128/74  Pulse: 97 95  Resp: 18 18  Temp: 98.8 F (37.1 C) 98.4 F (36.9 C)  SpO2: 92% 97%   There were no vitals filed for this visit. .Body mass index is 28.48 kg/m.  GENERAL: Sleepy.  Has received narcotics for shortness of breath OROPHARYNX: MMM, NECK: supple, no JVD LYMPH:  no palpable lymphadenopathy in the cervical, axillary or inguinal regions LUNGS: Right sided rales and also some left basilar rales  HEART: regular rate & rhythm ABDOMEN:  normoactive bowel sounds , non tender, not distended. Extremity: Trace pedal edema PSYCH: Sleepy but easily arousable.  NEURO: no focal motor/sensory deficits  LABORATORY DATA:  I have reviewed the data as listed  .    Latest Ref Rng & Units 10/11/2023    4:14 AM 10/10/2023    4:38 PM 10/08/2023   11:11 AM  CBC  WBC 4.0 - 10.5 K/uL 0.6  0.7  1.0   Hemoglobin 12.0 - 15.0 g/dL 7.5  7.7  6.3   Hematocrit 36.0 - 46.0 % 23.6  24.7  19.6   Platelets 150 -  400 K/uL 9  13  10     ANC of less than 100 .    Latest Ref Rng & Units 10/11/2023    4:14 AM 10/10/2023    4:38 PM 10/07/2023    1:15 PM  CMP  Glucose 70 - 99 mg/dL 886  874  81   BUN 8 - 23 mg/dL 16  19  21    Creatinine 0.44 - 1.00 mg/dL 9.12  8.93  9.02   Sodium 135 - 145 mmol/L 129  132  138   Potassium 3.5 - 5.1 mmol/L 4.3  4.4  4.1   Chloride 98 - 111 mmol/L 96  96  101   CO2 22 - 32 mmol/L 25  27  26    Calcium  8.9 - 10.3 mg/dL 8.3  9.1   9.1   Total Protein 6.5 - 8.1 g/dL 6.0  7.1  6.7   Total Bilirubin 0.0 - 1.2 mg/dL 2.0  1.3  0.9   Alkaline Phos 38 - 126 U/L 41  52  44   AST 15 - 41 U/L 26  27  26    ALT 0 - 44 U/L 13  15  15       RADIOGRAPHIC STUDIES: I have personally reviewed the radiological images as listed and agreed with the findings in the report. DG Chest Port 1 View Result Date: 10/11/2023 CLINICAL DATA:  Shortness of breath and fever. EXAM: PORTABLE CHEST 1 VIEW COMPARISON:  10/10/2023 FINDINGS: The cardio pericardial silhouette is enlarged. Diffuse bilateral interstitial and airspace disease evident, progressive on the left. There is more focal consolidative opacity in the right mid lung. Right-sided Port-A-Cath remains in place. No substantial pleural effusion. IMPRESSION: Diffuse bilateral interstitial and airspace disease, progressive on the left. Imaging features could be compatible with multifocal pneumonia and/or asymmetric edema. Electronically Signed   By: Camellia Candle M.D.   On: 10/11/2023 06:36   DG Chest Port 1 View if patient is in a treatment room. Result Date: 10/10/2023 CLINICAL DATA:  Provided history: Suspected Sepsis Fever. EXAM: PORTABLE CHEST 1 VIEW COMPARISON:  07/12/2023 FINDINGS: Right chest port with tip in the lower SVC. The heart is enlarged. Aortic atherosclerosis. Stable areas of scarring at the right lung base. Vascular congestion. No pneumothorax or large pleural effusion. Surgical clips in the right breast/chest wall. Left chest wall loop recorder. IMPRESSION: 1. Cardiomegaly with vascular congestion. 2. Stable areas of scarring at the right lung base. Electronically Signed   By: Andrea Gasman M.D.   On: 10/10/2023 17:39   IR IMAGING GUIDED PORT INSERTION Result Date: 10/07/2023 INDICATION: Myelodysplastic syndrome. Prior history of bladder cancer, s/p VIR port placement 07/18/2021, removed 11/26/2022. Poor venous access and will need frequent fluids/blood with current therapy EXAM:  IMPLANTED PORT A CATH PLACEMENT WITH ULTRASOUND AND FLUOROSCOPIC GUIDANCE MEDICATIONS: Ancef  2 gm IV The antibiotic was administered within an appropriate time interval prior to skin puncture. ANESTHESIA/SEDATION: Moderate (conscious) sedation was employed during this procedure. A total of Versed  1.5 mg and Fentanyl  50 mcg was administered intravenously. Moderate Sedation Time: 23 minutes. The patient's level of consciousness and vital signs were monitored continuously by radiology nursing throughout the procedure under my direct supervision. FLUOROSCOPY: Radiation Exposure Index and estimated peak skin dose (PSD); Reference air kerma (RAK), 1 mGy. COMPLICATIONS: None immediate. PROCEDURE: The procedure, risks, benefits, and alternatives were explained to the patient. Questions regarding the procedure were encouraged and answered. The patient understands and consents to the procedure. The RIGHT neck and chest were  prepped with chlorhexidine  in a sterile fashion, and a sterile drape was applied covering the operative field. Maximum barrier sterile technique with sterile gowns and gloves were used for the procedure. A timeout was performed prior to the initiation of the procedure. Local anesthesia was provided with 1% lidocaine  with epinephrine . After creating a small venotomy incision, a micropuncture kit was utilized to access the internal jugular vein under direct, real-time ultrasound guidance. Ultrasound image documentation was performed. The microwire was kinked to measure appropriate catheter length. A subcutaneous port pocket was then created along the upper chest wall utilizing a combination of sharp and blunt dissection. The pocket was irrigated with sterile saline. A single lumen power injectable port was chosen for placement. The 8 Fr catheter was tunneled from the port pocket site to the venotomy incision. The port was placed in the pocket. The external catheter was trimmed to appropriate length. At the  venotomy, an 8 Fr peel-away sheath was placed over a guidewire under fluoroscopic guidance. The catheter was then placed through the sheath and the sheath was removed. Final catheter positioning was confirmed and documented with a fluoroscopic spot radiograph. The port was accessed with a Huber needle, aspirated and flushed with heparinized saline. The port pocket incision was closed with interrupted 3-0 Vicryl suture then Dermabond was applied, including at the venotomy incision. Dressings were placed. The patient tolerated the procedure well without immediate post procedural complication. IMPRESSION: Successful placement of a RIGHT internal jugular approach power injectable Port-A-Cath. The tip of the catheter is positioned within the proximal RIGHT atrium. The catheter is ready for immediate use. Thom Hall, MD [facility] Electronically Signed   By: Thom Hall M.D.   On: 10/07/2023 15:53    ASSESSMENT & PLAN:   Very nice 78 year old female with multiple medical issues as noted above admitted with  #1 neutropenic fevers with sepsis related to pneumonia #2 right sided pneumonia-community-acquired versus healthcare acquired versus other reports cystic infections in the context of significant immunosuppression with severe neutropenia and MDS. #3 MDS with excess blasts with progressive pancytopenia related to the underlying disease and somewhat accentuated by recent initiation of Inqovi  from 09/21/2023. 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, status post a left nephro ureterectomy 04/27/2019  #8  Congenital left horseshoe kidney #9 right breast cancer, status post right lumpectomy and sentinel lymph node biopsy 04/01/2016-[id number], status post a right lumpectomy, adjuvant radiation, and 5 years of anastrozole  (started 07/15/2016)   #10 hypermetabolic left thyroid  nodule on PET 10/24/2021 #11 diabetes #12 gastroesophageal reflux disease #13 IBS #14 history of atrial fibrillation/flutter #15 history of NSVT #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 - Her daughter is an Charity fundraiser and has a very good understanding of her mother's health. - We discussed that her high-grade MDS with excess blasts is leading to progressive pancytopenia which is transfusion dependent and now is complicated with neutropenic fever with sepsis related to pneumonia. - Patient notes she is very tired and does not want  to continue to struggle with transfusions and treatments for her MDS and is ready to switch to best supportive cares through hospice. - I discussed that this is a very reasonable choice in the context of her overall health and incurable underlying bone marrow disorder with transfusion dependence. - No clear indication for G-CSF at this time since this is unlikely to improve her neutropenia meaningfully and might stimulate blasts.  This is also not appropriate in the context of patient's goals of care. - Reasonable to transfuse as needed till patient is discharged. - Patient has been accepted to [facility] but the patient and daughter prefer to go home with home hospice. - Much appreciate involvement by palliative care hospital medicine and hospice social worker.  They are trying to get her DME in place for discharge on Monday, 10/12/2023. - Would  recommend consideration of indwelling Foley's catheter for comfort since the patient might have limited mobility. - Comfort care medications per palliative care.  Anticipated symptoms including anxiety shortness of breath possible pain and delirium. -Reasonable to switch to oral antibiotics on discharge - All the patient's family members and patient's daughter who is the primary decision maker are on the same page. - Dr. Arvella Hof and Olam Ned, NP who are the patient's primary oncology/hematology team were messaged.  The total time spent in the appointment was 80 minutes*.  All of the patient's questions were answered with apparent satisfaction. The patient knows to call the clinic with any problems, questions or concerns.   Cynthia Saran MD MS AAHIVMS [facility] Hematology/Oncology Physician [facility]  .*Total Encounter Time as defined by the Centers for Medicare and Medicaid Services includes, in addition to the face-to-face time of a patient visit (documented in the note above) non-face-to-face time: obtaining and reviewing outside history, ordering and reviewing medications, tests or procedures, care coordination (communications with other health care professionals or caregivers) and documentation in the medical record.

## 2023-10-11 NOTE — Progress Notes (Signed)
 PROGRESS NOTE  ESHAL PROPPS FMW:[id number] DOB: 1945-09-28   PCP: [name], FNP  Patient is from: Home.  Son lives with patient.  DOA: 10/10/2023 LOS: 1  Chief complaints Chief Complaint  Patient presents with   Fever     Brief Narrative / Interim history: 78 y.o. 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  CXR concerning for RML/RLL infiltrate.  Cultures obtained.  Resuscitated with IV fluid and started on broad-spectrum antibiotics.  Patient had respiratory distress and volume overload from IV fluid resuscitation.  She also received 1 unit of blood overnight.  IV fluid discontinued and started on diuretics.   The next day, after extensive goals of care discussion with patient and patient's daughter, [name] at bedside, patient expressed her desire to be full comfort care and going home with home hospice as soon as possible.  We have discussed what comfort care entails including use of medications to manage symptoms.  We have also agreed to discontinue none comfort prevention such as antibiotics, blood transfusion... [name] is tearful but understand the importance of honoring her mother's wish.  Palliative medicine and TOC consulted for further support and facilitate disposition.   Patient was accepted to [facility] but prefers to go home with home hospice when DME delivered.  Hospice working on DME.   Subjective: Seen and examined earlier this morning.  Patient had respiratory distress from IV fluid resuscitation and blood transfusion.  She might also have aspiration episode.   Fluids discontinued and started on IV Lasix .  Extensive goals of care discussion as above.  Revisited patient this afternoon.  Patient is sleepy with no apparent distress.  Daughter, granddaughter and grandson in law at bedside.  Objective: Vitals:   10/11/23 0653 10/11/23 0728 10/11/23 0744 10/11/23 1032  BP: 101/72 107/70 102/63 128/74  Pulse: 86 92 97 95  Resp:  20 18 18   Temp:  98 F (36.7 C) 98.8 F (37.1 C) 98.4 F (36.9 C)  TempSrc:  Oral Oral Oral  SpO2: 91% 93% 92% 97%  Height:        Examination:  GENERAL: Eating on bedside chair. HEENT: MMM.  Vision and hearing grossly intact.  Eyelid edema. NECK: Supple.  No apparent JVD.  RESP:  No IWOB.  Rhonchi and crackles. CVS:  RRR. Heart sounds normal.  ABD/GI/GU: BS+. Abd soft, NTND.  MSK/EXT:  Moves extremities. No apparent deformity.  LE edema. SKIN: Skin erythema over recent port site. NEURO: Awake but falls asleep intermittently.  Oriented x 4.  No apparent focal neuro deficit. PSYCH: Calm. Normal affect.   Consultants:  Oncology Palliative medicine  Procedures: None  Microbiology summarized: COVID-19, influenza and RSV PCR nonreactive Blood cultures NGTD Strep PCR screen negative  Assessment and plan: End-of-life care/full comfort care: Transitioned to full comfort care after goal of care discussion with patient and patient's daughter, [name] at bedside.  Patient is very clear with her wishes to be kept comfortable.  She also likes to go home as soon as possible.  She says she had miserable night in the hospital due to her breathing, multiple interruptions... She says she does  not want to go through the same again.  Patient's daughter, [name] very supportive.  -Comfort care initiated.  Comfort medications ordered. -Palliative care consulted for help with end-of-life care -Hospice referral placed.  Accepted to [facility].  Patient prefers to go home -Waiting on DME to be delivered by hospice before  discharge.  Sepsis/neutropenic fever due to right lung pneumonia: Present on arrival.  Febrile with tachycardia, tachypnea and and leukopenia/neutropenia.  Immunocompromised due to MDS and chemotherapy.  Blood pressure stable.  Lactic acid negative.  CXR concerning for RML/RLL infiltrate.  She reports severe reflux concerning for aspiration. -Transition to full comfort care.  Respiratory distress/volume overload/possible acute HFpEF: Developed respiratory distress and edema after IV fluid resuscitation and blood transfusion.  Fluids discontinued.  Started on IV Lasix  -Now comfort care.   History of MDS-IB1-recently started on Inqovi .  Followed by Dr. Cloretta Pancytopenia-likely due to chemotherapy.  Has been receiving frequent blood transfusion/platelet infusion - Discussed with Dr. Onesimo from oncology team.  Appreciate his input.   History of A-fib/NSVT: Currently in sinus rhythm.  Not on anticoagulation.    Hypothyroidism - Continue home Synthroid  if she can swallow safely   Chronic pain/anxiety/restless leg syndrome -Adjusted and changed most of her meds to liquid form   GERD: Reports severe reflux. - Changed Protonix  to IV   Generalized weakness/physical deconditioning:   Advance care planning: See discussion above  Body mass index is 28.48 kg/m.           DVT prophylaxis:  Patient is full comfort care.  Code Status: DNR-comfort Family Communication: Updated patient's daughter at bedside this morning.  Daughter, granddaughter and grandson in law at bedside this afternoon. Level of care: Palliative Care Status is: Inpatient Remains inpatient appropriate because: End-of-life care   Final disposition: Home with home hospice on 10/6.   55 minutes with more than 50% spent in reviewing records, counseling patient/family and coordinating care.   Sch Meds:  Scheduled Meds:  levothyroxine   112 mcg Oral Daily   pantoprazole  (PROTONIX ) IV  40 mg Intravenous Q12H    traZODone   50 mg Oral QHS   Continuous Infusions: PRN Meds:.acetaminophen  **OR** acetaminophen , gabapentin , glycopyrrolate, guaiFENesin -dextromethorphan, HYDROmorphone  (DILAUDID ) injection, ipratropium-albuterol , LORazepam, prochlorperazine , traMADol   Antimicrobials: Anti-infectives (From admission, onward)    Start     Dose/Rate Route Frequency Ordered Stop   10/12/23 1600  vancomycin  (VANCOREADY) IVPB 1750 mg/350 mL  Status:  Discontinued        1,750 mg 175 mL/hr over 120 Minutes Intravenous Every 48 hours 10/10/23 1900 10/11/23 1109   10/11/23 1000  fluconazole  (DIFLUCAN ) tablet 200 mg  Status:  Discontinued        200 mg Oral Daily 10/10/23 2345 10/11/23 1609   10/11/23 0500  ceFEPIme  (MAXIPIME ) 2 g in sodium chloride  0.9 % 100 mL IVPB  Status:  Discontinued        2 g 200 mL/hr over 30 Minutes Intravenous Every 12 hours 10/10/23 1900 10/11/23 1609   10/11/23 0500  metroNIDAZOLE (FLAGYL) IVPB 500 mg  Status:  Discontinued        500 mg 100 mL/hr over 60 Minutes Intravenous Every 12 hours 10/10/23 1911 10/11/23 1109   10/11/23 0045  acyclovir  (ZOVIRAX ) tablet 400 mg  Status:  Discontinued        400 mg Oral 2 times daily 10/10/23 2345 10/11/23 1609   10/11/23 0000  metroNIDAZOLE (FLAGYL) IVPB 500 mg  Status:  Discontinued        500 mg 100  mL/hr over 60 Minutes Intravenous Every 12 hours 10/10/23 1836 10/10/23 1855   10/10/23 2000  doxycycline  (VIBRAMYCIN ) 100 mg in sodium chloride  0.9 % 250 mL IVPB  Status:  Discontinued        100 mg 125 mL/hr over 120 Minutes Intravenous Every 12 hours 10/10/23 1836 10/11/23 1609   10/10/23 1845  vancomycin  (VANCOREADY) IVPB 500 mg/100 mL        500 mg 100 mL/hr over 60 Minutes Intravenous  Once 10/10/23 1847 10/10/23 2038   10/10/23 1630  ceFEPIme  (MAXIPIME ) 2 g in sodium chloride  0.9 % 100 mL IVPB        2 g 200 mL/hr over 30 Minutes Intravenous  Once 10/10/23 1615 10/10/23 1710   10/10/23 1630  metroNIDAZOLE (FLAGYL) IVPB 500 mg         500 mg 100 mL/hr over 60 Minutes Intravenous  Once 10/10/23 1615 10/10/23 1855   10/10/23 1630  vancomycin  (VANCOCIN ) IVPB 1000 mg/200 mL premix        1,000 mg 200 mL/hr over 60 Minutes Intravenous  Once 10/10/23 1615 10/10/23 1915        I have personally reviewed the following labs and images: CBC: Recent Labs  Lab 10/05/23 1427 10/06/23 1157 10/07/23 1315 10/08/23 1111 10/10/23 1638 10/11/23 0414  WBC 1.2* 1.1* 1.2* 1.0* 0.7* 0.6*  NEUTROABS 0.3* 0.1* 0.1* 0.1* TOO FEW TO COUNT, SMEAR AVAILABLE FOR REVIEW  --   HGB 7.7* 7.4* 6.9* 6.3* 7.7* 7.5*  HCT 24.1* 22.8* 22.4* 19.6* 24.7* 23.6*  MCV 92.3 91.6 94.1 91.2 90.5 90.8  PLT 5* 16* 10* 10* 13* 9*   BMP &GFR Recent Labs  Lab 10/07/23 1315 10/10/23 1638 10/11/23 0414  NA 138 132* 129*  K 4.1 4.4 4.3  CL 101 96* 96*  CO2 26 27 25   GLUCOSE 81 125* 113*  BUN 21 19 16   CREATININE 0.97 1.06* 0.87  CALCIUM  9.1 9.1 8.3*   Estimated Creatinine Clearance: 49.3 mL/min (by C-G formula based on SCr of 0.87 mg/dL). Liver & Pancreas: Recent Labs  Lab 10/07/23 1315 10/10/23 1638 10/11/23 0414  AST 26 27 26   ALT 15 15 13   ALKPHOS 44 52 41  BILITOT 0.9 1.3* 2.0*  PROT 6.7 7.1 6.0*  ALBUMIN  4.0 4.0 3.4*   No results for input(s): LIPASE, AMYLASE in the last 168 hours. No results for input(s): AMMONIA in the last 168 hours. Diabetic: No results for input(s): HGBA1C in the last 72 hours. No results for input(s): GLUCAP in the last 168 hours. Cardiac Enzymes: No results for input(s): CKTOTAL, CKMB, CKMBINDEX, TROPONINI in the last 168 hours. Recent Labs    10/11/23 0414  PROBNP 7,025.0*   Coagulation Profile: Recent Labs  Lab 10/07/23 1315 10/10/23 1638 10/11/23 0414  INR 1.2 1.3* 1.4*   Thyroid  Function Tests: Recent Labs    10/10/23 2059  TSH 1.290   Lipid Profile: No results for input(s): CHOL, HDL, LDLCALC, TRIG, CHOLHDL, LDLDIRECT in the last 72 hours. Anemia  Panel: No results for input(s): VITAMINB12, FOLATE, FERRITIN, TIBC, IRON, RETICCTPCT in the last 72 hours. Urine analysis:    Component Value Date/Time   COLORURINE YELLOW 10/10/2023 1734   APPEARANCEUR CLEAR 10/10/2023 1734   LABSPEC 1.017 10/10/2023 1734   PHURINE 5.0 10/10/2023 1734   GLUCOSEU NEGATIVE 10/10/2023 1734   HGBUR SMALL (A) 10/10/2023 1734   BILIRUBINUR NEGATIVE 10/10/2023 1734   KETONESUR NEGATIVE 10/10/2023 1734   PROTEINUR NEGATIVE 10/10/2023 1734   NITRITE NEGATIVE  10/10/2023 1734   LEUKOCYTESUR NEGATIVE 10/10/2023 1734   Sepsis Labs: Invalid input(s): PROCALCITONIN, LACTICIDVEN  Microbiology: Recent Results (from the past 240 hours)  Culture, blood (Routine x 2)     Status: None (Preliminary result)   Collection Time: 10/10/23  4:31 PM   Specimen: BLOOD  Result Value Ref Range Status   Specimen Description   Final    BLOOD RIGHT ANTECUBITAL Performed at [facility], [address], [place], [place] [address]    Special Requests   Final    BOTTLES DRAWN AEROBIC AND ANAEROBIC Blood Culture adequate volume Performed at [facility], [address], [place], [place] [address]    Culture   Final    NO GROWTH < 24 HOURS Performed at [facility], [address], [place], [place] [address]    Report Status PENDING  Incomplete  Culture, blood (Routine x 2)     Status: None (Preliminary result)   Collection Time: 10/10/23  4:35 PM   Specimen: BLOOD RIGHT ARM  Result Value Ref Range Status   Specimen Description   Final    BLOOD RIGHT ARM Performed at [facility], [address], [place], [place] [address]    Special Requests   Final    BOTTLES DRAWN AEROBIC AND ANAEROBIC Blood Culture adequate volume Performed at [facility], [address], [place], [place] [address]    Culture   Final    NO GROWTH < 24 HOURS Performed at [facility], [address]  [place], [place] [address]    Report Status PENDING  Incomplete  Resp panel by RT-PCR (RSV, Flu A&B, Covid) Anterior Nasal Swab     Status: None   Collection Time: 10/10/23  4:35 PM   Specimen: Anterior Nasal Swab  Result Value Ref Range Status   SARS Coronavirus 2 by RT PCR NEGATIVE NEGATIVE Final    Comment: (NOTE) SARS-CoV-2 target nucleic acids are NOT DETECTED.  The SARS-CoV-2 RNA is generally detectable in upper respiratory specimens during the acute phase of infection. The lowest concentration of SARS-CoV-2 viral copies this assay can detect is 138 copies/mL. A negative result does not preclude SARS-Cov-2 infection and should not be used as the sole basis for treatment or other patient management decisions. A negative result may occur with  improper specimen collection/handling, submission of specimen other than nasopharyngeal swab, presence of viral mutation(s) within the areas targeted by this assay, and inadequate number of viral copies(<138 copies/mL). A negative result must be combined with clinical observations, patient history, and epidemiological information. The expected result is Negative.  Fact Sheet for Patients:  BloggerCourse.com  Fact Sheet for Healthcare Providers:  SeriousBroker.it  This test is no t yet approved or cleared by the [place]  FDA and  has been authorized for detection and/or diagnosis of SARS-CoV-2 by FDA under an Emergency Use Authorization (EUA). This EUA will remain  in effect (meaning this test can be used) for the duration of the COVID-19 declaration under Section 564(b)(1) of the Act, 21 U.S.C.section 360bbb-3(b)(1), unless the authorization is terminated  or revoked sooner.       Influenza A by PCR NEGATIVE NEGATIVE Final   Influenza B by PCR NEGATIVE NEGATIVE Final    Comment: (NOTE) The Xpert Xpress SARS-CoV-2/FLU/RSV plus assay is intended as an aid in the diagnosis of influenza  from Nasopharyngeal swab specimens and should not be used as a sole basis for treatment. Nasal washings  and aspirates are unacceptable for Xpert Xpress SARS-CoV-2/FLU/RSV testing.  Fact Sheet for Patients: BloggerCourse.com  Fact Sheet for Healthcare Providers: SeriousBroker.it  This test is not yet approved or cleared by the [place]  FDA and has been authorized for detection and/or diagnosis of SARS-CoV-2 by FDA under an Emergency Use Authorization (EUA). This EUA will remain in effect (meaning this test can be used) for the duration of the COVID-19 declaration under Section 564(b)(1) of the Act, 21 U.S.C. section 360bbb-3(b)(1), unless the authorization is terminated or revoked.     Resp Syncytial Virus by PCR NEGATIVE NEGATIVE Final    Comment: (NOTE) Fact Sheet for Patients: BloggerCourse.com  Fact Sheet for Healthcare Providers: SeriousBroker.it  This test is not yet approved or cleared by the [place]  FDA and has been authorized for detection and/or diagnosis of SARS-CoV-2 by FDA under an Emergency Use Authorization (EUA). This EUA will remain in effect (meaning this test can be used) for the duration of the COVID-19 declaration under Section 564(b)(1) of the Act, 21 U.S.C. section 360bbb-3(b)(1), unless the authorization is terminated or revoked.  Performed at [facility], [address], [place], [place] [address]   MRSA Next Gen by PCR, Nasal     Status: None   Collection Time: 10/11/23  9:09 AM   Specimen: Nasal Mucosa; Nasal Swab  Result Value Ref Range Status   MRSA by PCR Next Gen NOT DETECTED NOT DETECTED Final    Comment: (NOTE) The GeneXpert MRSA Assay (FDA approved for NASAL specimens only), is one component of a comprehensive MRSA colonization surveillance program. It is not intended to diagnose MRSA infection nor to guide or  monitor treatment for MRSA infections. Test performance is not FDA approved in patients less than 5 years old. Performed at [facility], [address], [place], [place] [address]     Radiology Studies: [facility] Chest Port 1 View Result Date: 10/11/2023 CLINICAL DATA:  Shortness of breath and fever. EXAM: PORTABLE CHEST 1 VIEW COMPARISON:  10/10/2023 FINDINGS: The cardio pericardial silhouette is enlarged. Diffuse bilateral interstitial and airspace disease evident, progressive on the left. There is more focal consolidative opacity in the right mid lung. Right-sided Port-A-Cath remains in place. No substantial pleural effusion. IMPRESSION: Diffuse bilateral interstitial and airspace disease, progressive on the left. Imaging features could be compatible with multifocal pneumonia and/or asymmetric edema. Electronically Signed   By: Camellia Candle M.D.   On: 10/11/2023 06:36   DG Chest Port 1 View if patient is in a treatment room. Result Date: 10/10/2023 CLINICAL DATA:  Provided history: Suspected Sepsis Fever. EXAM: PORTABLE CHEST 1 VIEW COMPARISON:  07/12/2023 FINDINGS: Right chest port with tip in the lower SVC. The heart is enlarged. Aortic atherosclerosis. Stable areas of scarring at the right lung base. Vascular congestion. No pneumothorax or large pleural effusion. Surgical clips in the right breast/chest wall. Left chest wall loop recorder. IMPRESSION: 1. Cardiomegaly with vascular congestion. 2. Stable areas of scarring at the right lung base. Electronically Signed   By: Andrea Gasman M.D.   On: 10/10/2023 17:39      Broedy Osbourne T. Lesleigh Hughson [facility]  If 7PM-7AM, please contact night-coverage [email or web address] 10/11/2023, 4:28 PM

## 2023-10-11 NOTE — Plan of Care (Signed)
 Discussed in front of patient and family plan of care for the evening, pain management and resting time with some teach back displayed by family. Problem: Education: Goal: Knowledge of General Education information will improve Description: Including pain rating scale, medication(s)/side effects and non-pharmacologic comfort measures Outcome: Progressing   Problem: Health Behavior/Discharge Planning: Goal: Ability to manage health-related needs will improve Outcome: Progressing

## 2023-10-12 ENCOUNTER — Other Ambulatory Visit: Payer: Self-pay | Admitting: Oncology

## 2023-10-12 ENCOUNTER — Inpatient Hospital Stay

## 2023-10-12 ENCOUNTER — Other Ambulatory Visit: Payer: Self-pay

## 2023-10-12 ENCOUNTER — Other Ambulatory Visit (HOSPITAL_COMMUNITY): Payer: Self-pay

## 2023-10-12 DIAGNOSIS — R5081 Fever presenting with conditions classified elsewhere: Secondary | ICD-10-CM | POA: Diagnosis not present

## 2023-10-12 DIAGNOSIS — D4621 Refractory anemia with excess of blasts 1: Secondary | ICD-10-CM | POA: Diagnosis not present

## 2023-10-12 DIAGNOSIS — Z515 Encounter for palliative care: Secondary | ICD-10-CM

## 2023-10-12 DIAGNOSIS — D709 Neutropenia, unspecified: Secondary | ICD-10-CM | POA: Diagnosis not present

## 2023-10-12 DIAGNOSIS — D696 Thrombocytopenia, unspecified: Secondary | ICD-10-CM | POA: Diagnosis not present

## 2023-10-12 DIAGNOSIS — Z7189 Other specified counseling: Secondary | ICD-10-CM | POA: Diagnosis not present

## 2023-10-12 LAB — BPAM PLATELET PHERESIS
Blood Product Expiration Date: 202510072359
Blood Product Expiration Date: 202510072359
Blood Product Expiration Date: 202510072359
Blood Product Expiration Date: 202510072359
Blood Product Expiration Date: 202510082359
ISSUE DATE / TIME: 202510050701
ISSUE DATE / TIME: 202510051434
ISSUE DATE / TIME: 202510051749
ISSUE DATE / TIME: 202510061054
Unit Type and Rh: 6200
Unit Type and Rh: 202510072359
Unit Type and Rh: 5100
Unit Type and Rh: 5100
Unit Type and Rh: 6200
Unit Type and Rh: 6200

## 2023-10-12 LAB — BPAM RBC
Blood Product Expiration Date: 202511012359
ISSUE DATE / TIME: 202510042223
Unit Type and Rh: 600

## 2023-10-12 LAB — TYPE AND SCREEN
ABO/RH(D): A NEG
Antibody Screen: NEGATIVE
Unit division: 0

## 2023-10-12 LAB — PREPARE PLATELET PHERESIS
Unit division: 0
Unit division: 0
Unit division: 0
Unit division: 0
Unit division: 0

## 2023-10-12 MED ORDER — ONDANSETRON 4 MG PO TBDP
4.0000 mg | ORAL_TABLET | Freq: Four times a day (QID) | ORAL | 0 refills | Status: AC | PRN
  Filled 2023-10-12: qty 20, 5d supply, fill #0

## 2023-10-12 MED ORDER — OXYCODONE HCL 5 MG/5ML PO SOLN
5.0000 mg | ORAL | 0 refills | Status: AC | PRN
  Filled 2023-10-12: qty 420, 7d supply, fill #0

## 2023-10-12 MED ORDER — ONDANSETRON 4 MG PO TBDP
4.0000 mg | ORAL_TABLET | Freq: Once | ORAL | Status: AC
  Administered 2023-10-12: 4 mg via ORAL
  Filled 2023-10-12: qty 1

## 2023-10-12 MED ORDER — FUROSEMIDE 10 MG/ML IJ SOLN
20.0000 mg | Freq: Once | INTRAMUSCULAR | Status: AC
  Administered 2023-10-12: 20 mg via INTRAVENOUS
  Filled 2023-10-12: qty 2

## 2023-10-12 MED ORDER — GABAPENTIN 100 MG PO CAPS
100.0000 mg | ORAL_CAPSULE | Freq: Three times a day (TID) | ORAL | 0 refills | Status: AC | PRN
  Filled 2023-10-12: qty 90, 30d supply, fill #0

## 2023-10-12 MED ORDER — FUROSEMIDE 40 MG PO TABS
40.0000 mg | ORAL_TABLET | Freq: Every day | ORAL | 0 refills | Status: AC | PRN
  Filled 2023-10-12: qty 30, 30d supply, fill #0

## 2023-10-12 MED ORDER — GLYCOPYRROLATE 2 MG PO TABS: 2.0000 mg | ORAL_TABLET | Freq: Four times a day (QID) | ORAL | 0 refills | Status: AC | PRN

## 2023-10-12 NOTE — Progress Notes (Signed)
 Patient discharged home w/ daughter via [facility]. AVS teaching given to daughter. No further needs/concerns. Meds delivered to bedside prior to discharge, except Robinul which the daughter said she will pick up from [facility]. PIVs removed.

## 2023-10-12 NOTE — Plan of Care (Signed)

## 2023-10-12 NOTE — TOC Transition Note (Signed)
 Transition of Care [facility]) - Discharge Note  Patient Details  Name: Cynthia [name] MRN: [id number] Date of Birth: 1945/03/31  [facility] CM/SW Contact:  Duwaine GORMAN Aran, LCSW Phone Number: 10/12/2023, 1:28 PM  Clinical Narrative: CSW notified by [facility] patient's DME has been delivered to the home. PTAR scheduled. Discharge packet completed. Daughter updated regarding transportation. Care management signing off.  Final next level of care: Home w Hospice Care Barriers to Discharge: Barriers Resolved  Patient Goals and CMS Choice Patient states their goals for this hospitalization and ongoing recovery are:: Go home with hospice CMS Medicare.gov Compare Post Acute Care list provided to:: Patient Represenative (must comment) Choice offered to / list presented to : Patient, Adult Children [place] ownership interest in [facility].provided to:: Patient   Discharge Placement Patient to be transferred to facility by: PTAR Name of family member notified: [name] Null (daughter) Patient and family notified of of transfer: 10/12/23  Discharge Plan and Services Additional resources added to the After Visit Summary for   In-house Referral: Hospice / Palliative Care Discharge Planning Services: CM Consult Post Acute Care Choice: Residential Hospice Bed          DME Arranged: N/A DME Agency: NA HH Arranged: NA HH Agency: Other - See comment Photographer) Date HH Agency Contacted: 10/11/23 Time HH Agency Contacted: 1000 Representative spoke with at [facility] Agency: Nat Babe  Social Drivers of Health (SDOH) Interventions SDOH Screenings   Food Insecurity: No Food Insecurity (10/10/2023)  Housing: Low Risk  (10/10/2023)  Transportation Needs: No Transportation Needs (10/10/2023)  Utilities: Not At Risk (10/10/2023)  Depression (PHQ2-9): Low Risk  (10/05/2023)  Social Connections: Socially Isolated (10/10/2023)  Tobacco Use: Low Risk  (10/10/2023)    Readmission Risk Interventions    10/11/2023   11:41 AM 09/03/2021   11:36 AM  Readmission Risk Prevention Plan  Transportation Screening Complete   PCP or Specialist Appt within 3-5 Days Complete Complete  HRI or Home Care Consult Complete Complete  Social Work Consult for Recovery Care Planning/Counseling Complete Complete  Palliative Care Screening Complete Complete  Medication Review Oceanographer) Complete Complete

## 2023-10-12 NOTE — Progress Notes (Signed)
 Daily Progress Note   Patient Name: Cynthia [name]       Date: 10/12/2023 DOB: Dec 22, 1945  Age: 78 y.o. MRN#: [id number] Attending Physician: Kathrin Mignon DASEN, MD Primary Care Physician: [name], FNP Admit Date: 10/10/2023  Reason for Consultation/Follow-up: Hospice Evaluation and Terminal Care  Subjective:  Awake alert, rested well last night, able to sit up in chair, attempting to feed herself breakfast.    Length of Stay: 2  Current Medications: Scheduled Meds:   levothyroxine   112 mcg Oral Daily   pantoprazole  (PROTONIX ) IV  40 mg Intravenous Q12H   traZODone   50 mg Oral QHS    Continuous Infusions:   PRN Meds: acetaminophen  **OR** acetaminophen , gabapentin , glycopyrrolate, guaiFENesin -dextromethorphan, HYDROmorphone  (DILAUDID ) injection, ipratropium-albuterol , LORazepam, prochlorperazine , sodium chloride  flush, traMADol   Physical Exam         Awake alert Appears frail and with generalized weakness Verbalizes and is able to discuss her needs Does not appear to be in acute distress  Vital Signs: BP 128/74   Pulse 95   Temp 98.4 F (36.9 C) (Oral)   Resp 18   Ht 5' 3 (1.6 m)   Wt 73 kg Comment: previous weight PTA  SpO2 97%   BMI 28.51 kg/m  SpO2: SpO2: 97 % O2 Device: O2 Device: High Flow Nasal Cannula O2 Flow Rate: O2 Flow Rate (L/min): 3 L/min  Intake/output summary:  Intake/Output Summary (Last 24 hours) at 10/12/2023 1143 Last data filed at 10/12/2023 0800 Gross per 24 hour  Intake 250 ml  Output 1050 ml  Net -800 ml   LBM: Last BM Date : 10/11/23 Baseline Weight: Weight: 73 kg (previous weight PTA) Most recent weight: Weight: 73 kg (previous weight PTA)       Palliative Assessment/Data:      Patient Active Problem List   Diagnosis Date  Noted   Myelodysplastic syndrome with excess blasts-1 (HCC) 10/11/2023   Thrombocytopenia 10/11/2023   Goals of care, counseling/discussion 10/11/2023   Absolute anemia 10/11/2023   Neutropenic fever 10/10/2023   NSVT (nonsustained ventricular tachycardia) (HCC) 06/19/2022   Hypokalemia 10/06/2021   Hypocalcemia 10/06/2021   GERD (gastroesophageal reflux disease) 10/06/2021   HCAP (healthcare-associated pneumonia) 09/12/2021   History of breast cancer 09/12/2021   Insomnia 09/12/2021   Acute respiratory failure with hypoxia (HCC) 09/02/2021  Elevated troponin 09/02/2021   Anemia of chronic disease 09/02/2021   Thrombocytosis 09/02/2021   RSV (respiratory syncytial virus pneumonia) 08/31/2021   Paroxysmal atrial fibrillation (HCC) 08/31/2021   Neutropenia 08/29/2021   Port-A-Cath in place 07/24/2021   Bladder cancer (HCC) 07/05/2021   Aortic atherosclerosis 05/17/2021   Atrial fibrillation (HCC) - pauses- post termination pauses 02/26/2021   Wide-complex tachycardia - fasicular VT 02/26/2021   Urothelial cancer (HCC) 09/22/2019   Pre-syncope 07/18/2019   New onset atrial fibrillation (HCC) 12/10/2018   HTN (hypertension) 12/10/2018   Diabetes type 2, controlled (HCC) 12/10/2018   Hypothyroidism 12/10/2018   Anxiety 12/10/2018   Atrial fibrillation with RVR (HCC) 12/10/2018   Genetic testing 06/25/2016   Malignant neoplasm of upper-inner quadrant of right breast in female, estrogen receptor positive (HCC) 03/18/2016    Palliative Care Assessment & Plan   Patient Profile:    Assessment: neutropenic fevers with sepsis related to pneumonia right sided pneumonia-community-acquired versus healthcare acquired versus other reports cystic infections in the context of significant immunosuppression with severe neutropenia and MDS.  MDS with excess blasts with progressive pancytopenia related to the underlying disease. 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  Comfort care.   Goals of Care and Additional Recommendations: Limitations on Scope of Treatment: Full Comfort Care  Code Status:    Code Status Orders  (From admission, onward)           Start     Ordered   10/11/23 0948  Do not attempt resuscitation (DNR) - Comfort care  (Code Status)  Continuous       Question Answer Comment  If patient has no pulse and is not breathing Do Not Attempt Resuscitation   In Pre-Arrest Conditions (Patient Is Breathing and Has a Pulse) Provide comfort measures. Relieve any mechanical airway obstruction. Avoid transfer unless required for comfort.   Consent: Discussion documented in EHR or advanced directives reviewed      10/11/23 0947           Code Status History     Date Active Date Inactive Code Status Order ID Comments User Context   10/10/2023 1833 10/11/2023 0947 Limited: Do not attempt resuscitation (DNR) -DNR-LIMITED -Do Not Intubate/DNI  [id number]  Kathrin Mignon DASEN, MD ED   10/07/2023 1556 10/08/2023 0523 Full Code [id number]  Hughes Simmonds, MD HOV   08/28/2023 1233 08/29/2023 0515 Full Code [id number]  Vanice Sharper, MD HOV   10/06/2021 1855 10/08/2021 1954 Partial Code [id number]  Rockey Denece LABOR, DO ED   09/12/2021 2212 09/14/2021 1823 DNR [id number]  Ricky Alfrieda DASEN, DO Inpatient   08/31/2021 1512 09/03/2021 1700 Full Code [id number]  Celinda Alm Lot, MD ED   04/27/2019 1831 04/29/2019 2311 Full Code [id number]  Cory Alan RIGGERS Inpatient   12/10/2018 1851 12/15/2018 1537 Full Code [id number]  Dock Bruno HERO., PA-C ED      Advance Directive Documentation    Flowsheet Row Most Recent Value  Type of Advance Directive Healthcare  Power of Attorney  Pre-existing out of facility DNR order (yellow form or pink MOST form) --  MOST Form in Place? --    Prognosis:  < 4-6 weeks  Discharge Planning: Home with Hospice  Care plan was  discussed with  patient, daughter at bedside.   Thank you for allowing the Palliative Medicine Team to assist in the care of this patient. High MDM     Greater than 50%  of this time was spent counseling and coordinating care related to the above assessment and plan.  Lonia Serve, MD  Please contact Palliative Medicine Team phone at [phone] for questions and concerns.

## 2023-10-12 NOTE — Progress Notes (Signed)
 Discharge meds in a secure bag delivered to pt's daughter in room by this RN- Gabapentin  too soon to refill- pt's daughter verbalized an understanding. Prescription kept on file at pharmacy

## 2023-10-12 NOTE — Discharge Summary (Signed)
 Physician Discharge Summary  Cynthia [name] FMW:[id number] DOB: November 19, 1945 DOA: 10/10/2023  PCP: [name], FNP  Admit date: 10/10/2023 Discharge date: 10/12/23  Admitted From: Home Disposition: Home with home hospice  Discharge Condition: Stable for transfer CODE STATUS: DNR-comfort Diet Orders (From admission, onward)     Start     Ordered   10/10/23 1834  Diet regular Room service appropriate? Yes; Fluid consistency: Thin  Diet effective now       Question Answer Comment  Room service appropriate? Yes   Fluid consistency: Thin      10/10/23 1833              Hospital course 78 y.o. 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  CXR concerning for RML/RLL infiltrate.  Cultures obtained.  Resuscitated with IV fluid and started on broad-spectrum antibiotics.   Patient had respiratory distress and volume overload from IV fluid resuscitation.  She also received 1 unit of blood overnight.  IV fluid discontinued and started on diuretics.    The next day, I had extensive goals of care discussion with patient and patient's daughter, Cynthia [name] at bedside. Patient expressed her desire to be full comfort care and going home with home hospice as soon as possible. We have discussed what comfort care entails including use of medications such as opiate, benzodiazepines ...to manage symptoms. We have also agreed to discontinue noncomfort interventions such as antibiotics, blood transfusion, lab draws... Cynthia [name] is tearful but understand the importance of honoring her mother's wish.  Palliative  medicine consulted and met with patient and patient's family.  Hospice referral placed.  She stayed overnight until DME delivered by hospice.  On the day of discharge, patient and daughter reported having restful night, and they were very grateful.  DME delivered, and she is discharged home with home hospice.   See individual problem list below for more.   Problems addressed during this hospitalization End-of-life care/full comfort care: Transitioned to full comfort care on 10/5 -Discharged home with home hospice for end-of-life care.  Not interested in residential hospice. -As needed meds including liquid oxycodone , Zofran  ODT, Xanax , Robinul - DME delivered by hospice.   Sepsis/neutropenic fever due to right lung pneumonia: Present on arrival.  Febrile with tachycardia, tachypnea and and leukopenia/neutropenia.  Immunocompromised due to MDS and chemotherapy.  Blood pressure stable.  Lactic acid negative.  CXR concerning for RML/RLL infiltrate.  She reports severe reflux concerning for aspiration.  Received broad-spectrum antibiotics for 24 hours. -Transitioned to full comfort care.   Respiratory distress/volume overload/possible acute HFpEF: Developed respiratory distress and edema after IV fluid resuscitation and blood transfusion.  Fluids discontinued.  Started on IV Lasix  -Now comfort care.   History of MDS-IB1-recently started on Inqovi .  Followed by Dr. Cloretta Pancytopenia-likely due to chemotherapy.  Has been receiving frequent blood transfusion/platelet infusion - Discussed with Dr. Onesimo from oncology team.  Appreciate his input.   History of A-fib/NSVT: Currently in sinus rhythm.  Not on anticoagulation.    Hypothyroidism - Continue home Synthroid  if she can swallow safely   Chronic pain/anxiety/restless leg syndrome - Gabapentin    GERD: Reports  severe reflux.   Generalized weakness/physical deconditioning:    Advance care planning: See discussion on 10/5.   Body mass  index is 28.51 kg/m.           Consultations: Hematology/oncology Palliative medicine  Time spent 35  minutes  Vital signs Vitals:   10/11/23 0728 10/11/23 0744 10/11/23 1032 10/11/23 2100  BP: 107/70 102/63 128/74   Pulse: 92 97 95   Temp: 98 F (36.7 C) 98.8 F (37.1 C) 98.4 F (36.9 C)   Resp: 20 18 18    Height:    5' 3 (1.6 m)  Weight:    73 kg Comment: previous weight PTA  SpO2: 93% 92% 97%   TempSrc: Oral Oral Oral   BMI (Calculated):    28.52     Discharge exam  GENERAL: No apparent distress.  Nontoxic. HEENT: MMM.  Vision and hearing grossly intact.  NECK: Supple.  No apparent JVD.  RESP:  No IWOB.  Rhonchorous. CVS:  RRR. Heart sounds normal.  ABD/GI/GU: BS+. Abd soft, NTND.  MSK/EXT:  Moves extremities. No apparent deformity. No edema.  SKIN: no apparent skin lesion or wound NEURO: Awake. Oriented appropriately.  No apparent focal neuro deficit. PSYCH: Calm. Normal affect.   Discharge Instructions  Allergies as of 10/12/2023   No Known Allergies      Medication List     STOP taking these medications    acyclovir  400 MG tablet Commonly known as: ZOVIRAX    Fenofibric Acid 135 MG Cpdr   ferrous sulfate  325 (65 FE) MG tablet   fluconazole  200 MG tablet Commonly known as: DIFLUCAN    Inqovi  35-100 MG oral tablet Generic drug: decitabine -cedazuridine    magnesium  oxide 400 MG tablet Commonly known as: MAG-OX   MAGNESIUM  PO   mupirocin ointment 2 % Commonly known as: BACTROBAN   prochlorperazine  5 MG tablet Commonly known as: COMPAZINE    sulfamethoxazole -trimethoprim  800-160 MG tablet Commonly known as: BACTRIM  DS   traMADol -acetaminophen  37.5-325 MG tablet Commonly known as: Ultracet    Vitamin D -3 25 MCG (1000 UT) Caps       TAKE these medications    acetaminophen  650 MG CR tablet Commonly known as: TYLENOL  Take 1,300 mg by mouth at bedtime. What changed:  how much to take when to take this   ALPRAZolam  0.5 MG  tablet Commonly known as: XANAX  Take 0.5-1 mg by mouth See admin instructions. Take 2 tablets (1 mg) by mouth scheduled every night at bedtime, may take an additional dose (0.5 mg) in 4 hours if still unable to sleep.   amiodarone  200 MG tablet Commonly known as: PACERONE  Take 1 tablet (200 mg total) by mouth daily.   carboxymethylcellulose 0.5 % Soln Commonly known as: REFRESH PLUS Place 1 drop into both eyes in the morning and at bedtime.   furosemide  40 MG tablet Commonly known as: Lasix  Take 1 tablet (40 mg total) by mouth daily as needed (Shortness of breath).   gabapentin  100 MG capsule Commonly known as: NEURONTIN  Take 1 capsule (100 mg total) by mouth 3 (three) times daily as needed. What changed:  when to take this reasons to take this   glycopyrrolate 2 MG tablet Commonly known as: ROBINUL Take 1 tablet (2 mg total) by mouth 4 (four) times daily as needed for up to 7 days (Secretion).   levothyroxine  112 MCG tablet Commonly known as: SYNTHROID  Take 112 mcg by mouth daily.   omeprazole 40 MG capsule Commonly known as: PRILOSEC Take 40 mg by mouth daily  before breakfast.   ondansetron  4 MG disintegrating tablet Commonly known as: ZOFRAN -ODT Dissolve 1 tablet (4 mg total) by mouth every 6 (six) hours as needed for nausea or vomiting.   oxyCODONE  5 MG/5ML solution Commonly known as: ROXICODONE  Take 5-10 mLs (5-10 mg total) by mouth every 4 (four) hours as needed for up to 7 days (Pain and air hunger).   traZODone  100 MG tablet Commonly known as: DESYREL  Take 50 mg by mouth at bedtime.         Procedures/Studies:   DG Chest Port 1 View Result Date: 10/11/2023 CLINICAL DATA:  Shortness of breath and fever. EXAM: PORTABLE CHEST 1 VIEW COMPARISON:  10/10/2023 FINDINGS: The cardio pericardial silhouette is enlarged. Diffuse bilateral interstitial and airspace disease evident, progressive on the left. There is more focal consolidative opacity in the right mid  lung. Right-sided Port-A-Cath remains in place. No substantial pleural effusion. IMPRESSION: Diffuse bilateral interstitial and airspace disease, progressive on the left. Imaging features could be compatible with multifocal pneumonia and/or asymmetric edema. Electronically Signed   By: Camellia Candle M.D.   On: 10/11/2023 06:36   DG Chest Port 1 View if patient is in a treatment room. Result Date: 10/10/2023 CLINICAL DATA:  Provided history: Suspected Sepsis Fever. EXAM: PORTABLE CHEST 1 VIEW COMPARISON:  07/12/2023 FINDINGS: Right chest port with tip in the lower SVC. The heart is enlarged. Aortic atherosclerosis. Stable areas of scarring at the right lung base. Vascular congestion. No pneumothorax or large pleural effusion. Surgical clips in the right breast/chest wall. Left chest wall loop recorder. IMPRESSION: 1. Cardiomegaly with vascular congestion. 2. Stable areas of scarring at the right lung base. Electronically Signed   By: Andrea Gasman M.D.   On: 10/10/2023 17:39   IR IMAGING GUIDED PORT INSERTION Result Date: 10/07/2023 INDICATION: Myelodysplastic syndrome. Prior history of bladder cancer, s/p VIR port placement 07/18/2021, removed 11/26/2022. Poor venous access and will need frequent fluids/blood with current therapy EXAM: IMPLANTED PORT A CATH PLACEMENT WITH ULTRASOUND AND FLUOROSCOPIC GUIDANCE MEDICATIONS: Ancef  2 gm IV The antibiotic was administered within an appropriate time interval prior to skin puncture. ANESTHESIA/SEDATION: Moderate (conscious) sedation was employed during this procedure. A total of Versed  1.5 mg and Fentanyl  50 mcg was administered intravenously. Moderate Sedation Time: 23 minutes. The patient's level of consciousness and vital signs were monitored continuously by radiology nursing throughout the procedure under my direct supervision. FLUOROSCOPY: Radiation Exposure Index and estimated peak skin dose (PSD); Reference air kerma (RAK), 1 mGy. COMPLICATIONS: None  immediate. PROCEDURE: The procedure, risks, benefits, and alternatives were explained to the patient. Questions regarding the procedure were encouraged and answered. The patient understands and consents to the procedure. The RIGHT neck and chest were prepped with chlorhexidine  in a sterile fashion, and a sterile drape was applied covering the operative field. Maximum barrier sterile technique with sterile gowns and gloves were used for the procedure. A timeout was performed prior to the initiation of the procedure. Local anesthesia was provided with 1% lidocaine  with epinephrine . After creating a small venotomy incision, a micropuncture kit was utilized to access the internal jugular vein under direct, real-time ultrasound guidance. Ultrasound image documentation was performed. The microwire was kinked to measure appropriate catheter length. A subcutaneous port pocket was then created along the upper chest wall utilizing a combination of sharp and blunt dissection. The pocket was irrigated with sterile saline. A single lumen power injectable port was chosen for placement. The 8 Fr catheter was tunneled from the port pocket  site to the venotomy incision. The port was placed in the pocket. The external catheter was trimmed to appropriate length. At the venotomy, an 8 Fr peel-away sheath was placed over a guidewire under fluoroscopic guidance. The catheter was then placed through the sheath and the sheath was removed. Final catheter positioning was confirmed and documented with a fluoroscopic spot radiograph. The port was accessed with a Huber needle, aspirated and flushed with heparinized saline. The port pocket incision was closed with interrupted 3-0 Vicryl suture then Dermabond was applied, including at the venotomy incision. Dressings were placed. The patient tolerated the procedure well without immediate post procedural complication. IMPRESSION: Successful placement of a RIGHT internal jugular approach power  injectable Port-A-Cath. The tip of the catheter is positioned within the proximal RIGHT atrium. The catheter is ready for immediate use. Thom Hall, MD [facility]iology Electronically Signed   By: Thom Hall M.D.   On: 10/07/2023 15:53       The results of significant diagnostics from this hospitalization (including imaging, microbiology, ancillary and laboratory) are listed below for reference.     Microbiology: Recent Results (from the past 240 hours)  Culture, blood (Routine x 2)     Status: None (Preliminary result)   Collection Time: 10/10/23  4:31 PM   Specimen: BLOOD  Result Value Ref Range Status   Specimen Description   Final    BLOOD RIGHT ANTECUBITAL Performed at [facility], [address] [place], [place] [address]    Special Requests   Final    BOTTLES DRAWN AEROBIC AND ANAEROBIC Blood Culture adequate volume Performed at [facility], [address], [place], [place] [address]    Culture   Final    NO GROWTH 2 DAYS Performed at [facility], [address] [place], [place] [address]    Report Status PENDING  Incomplete  Culture, blood (Routine x 2)     Status: None (Preliminary result)   Collection Time: 10/10/23  4:35 PM   Specimen: BLOOD RIGHT ARM  Result Value Ref Range Status   Specimen Description   Final    BLOOD RIGHT ARM Performed at [facility], [address] [place], [place] [address]    Special Requests   Final    BOTTLES DRAWN AEROBIC AND ANAEROBIC Blood Culture adequate volume Performed at [facility], [address], [place], [place] [address]    Culture   Final    NO GROWTH 2 DAYS Performed at [facility], [address], [place], [place] [address]    Report Status PENDING  Incomplete  Resp panel by RT-PCR (RSV, Flu A&B, Covid) Anterior Nasal Swab     Status: None   Collection Time: 10/10/23  4:35 PM    Specimen: Anterior Nasal Swab  Result Value Ref Range Status   SARS Coronavirus 2 by RT PCR NEGATIVE NEGATIVE Final    Comment: (NOTE) SARS-CoV-2 target nucleic acids are NOT DETECTED.  The SARS-CoV-2 RNA is generally detectable in upper respiratory specimens during the acute phase of infection. The lowest concentration of SARS-CoV-2 viral copies this assay can detect is 138 copies/mL. A negative result does not preclude SARS-Cov-2 infection and should not be used as the sole basis for treatment or other patient management decisions. A negative result may occur with  improper specimen collection/handling, submission of specimen other than nasopharyngeal swab, presence of viral mutation(s) within the areas targeted by this  assay, and inadequate number of viral copies(<138 copies/mL). A negative result must be combined with clinical observations, patient history, and epidemiological information. The expected result is Negative.  Fact Sheet for Patients:  BloggerCourse.com  Fact Sheet for Healthcare Providers:  SeriousBroker.it  This test is no t yet approved or cleared by the [place]  FDA and  has been authorized for detection and/or diagnosis of SARS-CoV-2 by FDA under an Emergency Use Authorization (EUA). This EUA will remain  in effect (meaning this test can be used) for the duration of the COVID-19 declaration under Section 564(b)(1) of the Act, 21 U.S.C.section 360bbb-3(b)(1), unless the authorization is terminated  or revoked sooner.       Influenza A by PCR NEGATIVE NEGATIVE Final   Influenza B by PCR NEGATIVE NEGATIVE Final    Comment: (NOTE) The Xpert Xpress SARS-CoV-2/FLU/RSV plus assay is intended as an aid in the diagnosis of influenza from Nasopharyngeal swab specimens and should not be used as a sole basis for treatment. Nasal washings and aspirates are unacceptable for Xpert Xpress  SARS-CoV-2/FLU/RSV testing.  Fact Sheet for Patients: BloggerCourse.com  Fact Sheet for Healthcare Providers: SeriousBroker.it  This test is not yet approved or cleared by the [place]  FDA and has been authorized for detection and/or diagnosis of SARS-CoV-2 by FDA under an Emergency Use Authorization (EUA). This EUA will remain in effect (meaning this test can be used) for the duration of the COVID-19 declaration under Section 564(b)(1) of the Act, 21 U.S.C. section 360bbb-3(b)(1), unless the authorization is terminated or revoked.     Resp Syncytial Virus by PCR NEGATIVE NEGATIVE Final    Comment: (NOTE) Fact Sheet for Patients: BloggerCourse.com  Fact Sheet for Healthcare Providers: SeriousBroker.it  This test is not yet approved or cleared by the [place]  FDA and has been authorized for detection and/or diagnosis of SARS-CoV-2 by FDA under an Emergency Use Authorization (EUA). This EUA will remain in effect (meaning this test can be used) for the duration of the COVID-19 declaration under Section 564(b)(1) of the Act, 21 U.S.C. section 360bbb-3(b)(1), unless the authorization is terminated or revoked.  Performed at [facility], [address] [place], [place] [address]   MRSA Next Gen by PCR, Nasal     Status: None   Collection Time: 10/11/23  9:09 AM   Specimen: Nasal Mucosa; Nasal Swab  Result Value Ref Range Status   MRSA by PCR Next Gen NOT DETECTED NOT DETECTED Final    Comment: (NOTE) The GeneXpert MRSA Assay (FDA approved for NASAL specimens only), is one component of a comprehensive MRSA colonization surveillance program. It is not intended to diagnose MRSA infection nor to guide or monitor treatment for MRSA infections. Test performance is not FDA approved in patients less than 27 years old. Performed at [facility], [address] [place], [place] [address]      Labs:  CBC: Recent Labs  Lab 10/05/23 1427 10/06/23 1157 10/07/23 1315 10/08/23 1111 10/10/23 1638 10/11/23 0414  WBC 1.2* 1.1* 1.2* 1.0* 0.7* 0.6*  NEUTROABS 0.3* 0.1* 0.1* 0.1* TOO FEW TO COUNT, SMEAR AVAILABLE FOR REVIEW  --   HGB 7.7* 7.4* 6.9* 6.3* 7.7* 7.5*  HCT 24.1* 22.8* 22.4* 19.6* 24.7* 23.6*  MCV 92.3 91.6 94.1 91.2 90.5 90.8  PLT 5* 16* 10* 10* 13* 9*   BMP &GFR Recent Labs  Lab 10/07/23 1315 10/10/23 1638 10/11/23 0414  NA 138 132* 129*  K 4.1 4.4 4.3  CL 101  96* 96*  CO2 26 27 25   GLUCOSE 81 125* 113*  BUN 21 19 16   CREATININE 0.97 1.06* 0.87  CALCIUM  9.1 9.1 8.3*   Estimated Creatinine Clearance: 49.3 mL/min (by C-G formula based on SCr of 0.87 mg/dL). Liver & Pancreas: Recent Labs  Lab 10/07/23 1315 10/10/23 1638 10/11/23 0414  AST 26 27 26   ALT 15 15 13   ALKPHOS 44 52 41  BILITOT 0.9 1.3* 2.0*  PROT 6.7 7.1 6.0*  ALBUMIN  4.0 4.0 3.4*   No results for input(s): LIPASE, AMYLASE in the last 168 hours. No results for input(s): AMMONIA in the last 168 hours. Diabetic: No results for input(s): HGBA1C in the last 72 hours. No results for input(s): GLUCAP in the last 168 hours. Cardiac Enzymes: No results for input(s): CKTOTAL, CKMB, CKMBINDEX, TROPONINI in the last 168 hours. Recent Labs    10/11/23 0414  PROBNP 7,025.0*   Coagulation Profile: Recent Labs  Lab 10/07/23 1315 10/10/23 1638 10/11/23 0414  INR 1.2 1.3* 1.4*   Thyroid  Function Tests: Recent Labs    10/10/23 2059  TSH 1.290   Lipid Profile: No results for input(s): CHOL, HDL, LDLCALC, TRIG, CHOLHDL, LDLDIRECT in the last 72 hours. Anemia Panel: No results for input(s): VITAMINB12, FOLATE, FERRITIN, TIBC, IRON, RETICCTPCT in the last 72 hours. Urine analysis:    Component Value Date/Time   COLORURINE YELLOW 10/10/2023 1734   APPEARANCEUR CLEAR 10/10/2023 1734    LABSPEC 1.017 10/10/2023 1734   PHURINE 5.0 10/10/2023 1734   GLUCOSEU NEGATIVE 10/10/2023 1734   HGBUR SMALL (A) 10/10/2023 1734   BILIRUBINUR NEGATIVE 10/10/2023 1734   KETONESUR NEGATIVE 10/10/2023 1734   PROTEINUR NEGATIVE 10/10/2023 1734   NITRITE NEGATIVE 10/10/2023 1734   LEUKOCYTESUR NEGATIVE 10/10/2023 1734   Sepsis Labs: Invalid input(s): PROCALCITONIN, LACTICIDVEN   SIGNED:  Val Schiavo T Kamaile Zachow, MD  [facility] 10/12/2023, 2:14 PM

## 2023-10-12 NOTE — Progress Notes (Signed)
 I received a referral from palliative care team to provide support to Cynthia [name] and her daughter.  Cynthia [name] shared with me that she has had a good life and that she is ready to go home.  Her husband died last 12/16/2023.  While we were talking, transport arrived to bring her to her home where they have arranged for her care with Hospice.  I provided support to her and to her daughter and affirmed the act of love it is for her daughter to follow her mother's wishes.

## 2023-10-13 ENCOUNTER — Other Ambulatory Visit: Payer: Self-pay

## 2023-10-13 LAB — PATHOLOGIST SMEAR REVIEW

## 2023-10-13 NOTE — Progress Notes (Signed)
 Per hospital admission note on 10/4, patient transferring to home hospice care on full comfort care. Disenrolled.

## 2023-10-15 ENCOUNTER — Inpatient Hospital Stay

## 2023-10-15 ENCOUNTER — Ambulatory Visit: Admitting: Nurse Practitioner

## 2023-10-15 ENCOUNTER — Inpatient Hospital Stay: Admitting: Nurse Practitioner

## 2023-10-15 ENCOUNTER — Other Ambulatory Visit

## 2023-10-15 ENCOUNTER — Ambulatory Visit: Admitting: Oncology

## 2023-10-15 LAB — CULTURE, BLOOD (ROUTINE X 2)
Culture: NO GROWTH
Culture: NO GROWTH
Special Requests: ADEQUATE
Special Requests: ADEQUATE

## 2023-10-22 ENCOUNTER — Other Ambulatory Visit

## 2023-10-28 ENCOUNTER — Other Ambulatory Visit

## 2023-10-28 ENCOUNTER — Ambulatory Visit: Admitting: Nurse Practitioner

## 2023-10-29 ENCOUNTER — Ambulatory Visit: Admitting: Oncology

## 2023-10-29 ENCOUNTER — Other Ambulatory Visit

## 2023-11-07 DEATH — deceased

## 2023-12-01 ENCOUNTER — Ambulatory Visit: Admitting: Oncology

## 2024-02-03 ENCOUNTER — Ambulatory Visit: Admitting: Dermatology
